# Patient Record
Sex: Female | Born: 1940 | Race: White | Hispanic: No | Marital: Married | Smoking: Former smoker
Health system: Southern US, Community
[De-identification: ages and names within clinical notes are randomized; demographics above are authoritative.]

## PROBLEM LIST (undated history)

## (undated) DIAGNOSIS — L509 Urticaria, unspecified: Secondary | ICD-10-CM

## (undated) DIAGNOSIS — C801 Malignant (primary) neoplasm, unspecified: Secondary | ICD-10-CM

## (undated) DIAGNOSIS — I10 Essential (primary) hypertension: Secondary | ICD-10-CM

## (undated) DIAGNOSIS — J45909 Unspecified asthma, uncomplicated: Secondary | ICD-10-CM

## (undated) HISTORY — PX: TONSILLECTOMY: SUR1361

## (undated) HISTORY — PX: BREAST SURGERY: SHX581

## (undated) HISTORY — PX: ADENOIDECTOMY: SUR15

## (undated) HISTORY — DX: Malignant (primary) neoplasm, unspecified: C80.1

## (undated) HISTORY — PX: LEG SURGERY: SHX1003

## (undated) HISTORY — DX: Essential (primary) hypertension: I10

## (undated) HISTORY — DX: Urticaria, unspecified: L50.9

## (undated) HISTORY — PX: ABDOMINAL ADHESION SURGERY: SHX90

## (undated) HISTORY — PX: VEIN SURGERY: SHX48

## (undated) HISTORY — PX: OTHER SURGICAL HISTORY: SHX169

## (undated) HISTORY — DX: Unspecified asthma, uncomplicated: J45.909

## (undated) HISTORY — PX: KNEE SURGERY: SHX244

---

## 2011-07-08 DIAGNOSIS — Z803 Family history of malignant neoplasm of breast: Secondary | ICD-10-CM

## 2011-07-08 DIAGNOSIS — I10 Essential (primary) hypertension: Secondary | ICD-10-CM

## 2011-07-08 DIAGNOSIS — N6019 Diffuse cystic mastopathy of unspecified breast: Secondary | ICD-10-CM

## 2011-07-08 HISTORY — DX: Essential (primary) hypertension: I10

## 2011-07-08 HISTORY — DX: Diffuse cystic mastopathy of unspecified breast: N60.19

## 2011-07-08 HISTORY — DX: Family history of malignant neoplasm of breast: Z80.3

## 2013-08-16 DIAGNOSIS — R928 Other abnormal and inconclusive findings on diagnostic imaging of breast: Secondary | ICD-10-CM

## 2013-08-16 HISTORY — DX: Other abnormal and inconclusive findings on diagnostic imaging of breast: R92.8

## 2013-12-16 DIAGNOSIS — D126 Benign neoplasm of colon, unspecified: Secondary | ICD-10-CM

## 2013-12-16 DIAGNOSIS — E785 Hyperlipidemia, unspecified: Secondary | ICD-10-CM | POA: Insufficient documentation

## 2013-12-16 DIAGNOSIS — K5900 Constipation, unspecified: Secondary | ICD-10-CM

## 2013-12-16 DIAGNOSIS — M7580 Other shoulder lesions, unspecified shoulder: Secondary | ICD-10-CM

## 2013-12-16 DIAGNOSIS — I493 Ventricular premature depolarization: Secondary | ICD-10-CM

## 2013-12-16 DIAGNOSIS — R011 Cardiac murmur, unspecified: Secondary | ICD-10-CM | POA: Insufficient documentation

## 2013-12-16 DIAGNOSIS — F419 Anxiety disorder, unspecified: Secondary | ICD-10-CM | POA: Insufficient documentation

## 2013-12-16 DIAGNOSIS — L900 Lichen sclerosus et atrophicus: Secondary | ICD-10-CM | POA: Insufficient documentation

## 2013-12-16 DIAGNOSIS — G5600 Carpal tunnel syndrome, unspecified upper limb: Secondary | ICD-10-CM

## 2013-12-16 DIAGNOSIS — R42 Dizziness and giddiness: Secondary | ICD-10-CM

## 2013-12-16 DIAGNOSIS — F339 Major depressive disorder, recurrent, unspecified: Secondary | ICD-10-CM

## 2013-12-16 DIAGNOSIS — L659 Nonscarring hair loss, unspecified: Secondary | ICD-10-CM

## 2013-12-16 DIAGNOSIS — K760 Fatty (change of) liver, not elsewhere classified: Secondary | ICD-10-CM

## 2013-12-16 DIAGNOSIS — M25569 Pain in unspecified knee: Secondary | ICD-10-CM

## 2013-12-16 DIAGNOSIS — J45909 Unspecified asthma, uncomplicated: Secondary | ICD-10-CM | POA: Insufficient documentation

## 2013-12-16 DIAGNOSIS — F32A Depression, unspecified: Secondary | ICD-10-CM | POA: Insufficient documentation

## 2013-12-16 DIAGNOSIS — K589 Irritable bowel syndrome without diarrhea: Secondary | ICD-10-CM

## 2013-12-16 HISTORY — DX: Other shoulder lesions, unspecified shoulder: M75.80

## 2013-12-16 HISTORY — DX: Constipation, unspecified: K59.00

## 2013-12-16 HISTORY — DX: Fatty (change of) liver, not elsewhere classified: K76.0

## 2013-12-16 HISTORY — DX: Dizziness and giddiness: R42

## 2013-12-16 HISTORY — DX: Nonscarring hair loss, unspecified: L65.9

## 2013-12-16 HISTORY — DX: Lichen sclerosus et atrophicus: L90.0

## 2013-12-16 HISTORY — DX: Pain in unspecified knee: M25.569

## 2013-12-16 HISTORY — DX: Irritable bowel syndrome, unspecified: K58.9

## 2013-12-16 HISTORY — DX: Hyperlipidemia, unspecified: E78.5

## 2013-12-16 HISTORY — DX: Benign neoplasm of colon, unspecified: D12.6

## 2013-12-16 HISTORY — DX: Major depressive disorder, recurrent, unspecified: F33.9

## 2013-12-16 HISTORY — DX: Ventricular premature depolarization: I49.3

## 2013-12-16 HISTORY — DX: Carpal tunnel syndrome, unspecified upper limb: G56.00

## 2013-12-16 HISTORY — DX: Cardiac murmur, unspecified: R01.1

## 2015-01-25 ENCOUNTER — Other Ambulatory Visit: Payer: Self-pay | Admitting: Allergy

## 2015-01-25 MED ORDER — ALBUTEROL SULFATE HFA 108 (90 BASE) MCG/ACT IN AERS: 2.0000 | INHALATION_SPRAY | RESPIRATORY_TRACT | Status: DC | PRN

## 2015-03-09 ENCOUNTER — Other Ambulatory Visit: Payer: Self-pay | Admitting: Allergy

## 2015-03-09 MED ORDER — MONTELUKAST SODIUM 10 MG PO TABS: 10.0000 mg | ORAL_TABLET | Freq: Every day | ORAL | Status: DC

## 2015-03-09 MED ORDER — ALBUTEROL SULFATE HFA 108 (90 BASE) MCG/ACT IN AERS: 2.0000 | INHALATION_SPRAY | RESPIRATORY_TRACT | Status: DC | PRN

## 2015-04-14 DIAGNOSIS — H02423 Myogenic ptosis of bilateral eyelids: Secondary | ICD-10-CM

## 2015-04-14 DIAGNOSIS — H02834 Dermatochalasis of left upper eyelid: Secondary | ICD-10-CM | POA: Insufficient documentation

## 2015-04-14 DIAGNOSIS — H02831 Dermatochalasis of right upper eyelid: Secondary | ICD-10-CM | POA: Insufficient documentation

## 2015-04-14 HISTORY — DX: Myogenic ptosis of bilateral eyelids: H02.423

## 2015-04-14 HISTORY — DX: Dermatochalasis of right upper eyelid: H02.831

## 2015-04-21 DIAGNOSIS — H02429 Myogenic ptosis of unspecified eyelid: Secondary | ICD-10-CM

## 2015-04-21 DIAGNOSIS — H53453 Other localized visual field defect, bilateral: Secondary | ICD-10-CM | POA: Insufficient documentation

## 2015-04-21 HISTORY — DX: Other localized visual field defect, bilateral: H53.453

## 2015-04-21 HISTORY — DX: Myogenic ptosis of unspecified eyelid: H02.429

## 2015-05-08 DIAGNOSIS — T8130XA Disruption of wound, unspecified, initial encounter: Secondary | ICD-10-CM | POA: Insufficient documentation

## 2015-05-08 HISTORY — DX: Disruption of wound, unspecified, initial encounter: T81.30XA

## 2015-06-20 DIAGNOSIS — Z79899 Other long term (current) drug therapy: Secondary | ICD-10-CM

## 2015-06-20 HISTORY — DX: Other long term (current) drug therapy: Z79.899

## 2015-06-21 DIAGNOSIS — M8580 Other specified disorders of bone density and structure, unspecified site: Secondary | ICD-10-CM

## 2015-06-21 HISTORY — DX: Other specified disorders of bone density and structure, unspecified site: M85.80

## 2015-07-19 ENCOUNTER — Other Ambulatory Visit: Payer: Self-pay | Admitting: Pediatrics

## 2015-08-25 ENCOUNTER — Other Ambulatory Visit: Payer: Self-pay | Admitting: Pediatrics

## 2015-08-29 ENCOUNTER — Other Ambulatory Visit: Payer: Self-pay | Admitting: Pediatrics

## 2015-08-30 ENCOUNTER — Other Ambulatory Visit: Payer: Self-pay

## 2015-08-30 ENCOUNTER — Telehealth: Payer: Self-pay | Admitting: Pediatrics

## 2015-08-30 MED ORDER — MONTELUKAST SODIUM 10 MG PO TABS: 10.0000 mg | ORAL_TABLET | Freq: Every day | ORAL | Status: DC

## 2015-08-30 NOTE — Telephone Encounter (Signed)
Sent in rx with no refills

## 2015-08-30 NOTE — Telephone Encounter (Signed)
Pt called and need a refill on her singular and she made appointment with Dr Verlin Fester since Dr.Bardelas is not here in [place] anymore Her appointment is on July 13.at 10.45am. [facility] [address].

## 2015-09-25 ENCOUNTER — Other Ambulatory Visit: Payer: Self-pay | Admitting: Pediatrics

## 2015-09-28 ENCOUNTER — Ambulatory Visit (INDEPENDENT_AMBULATORY_CARE_PROVIDER_SITE_OTHER): Payer: Medicare Other | Admitting: Allergy and Immunology

## 2015-09-28 ENCOUNTER — Encounter: Payer: Self-pay | Admitting: Allergy and Immunology

## 2015-09-28 VITALS — BP 120/80 | HR 79 | Temp 98.0°F | Resp 15

## 2015-09-28 DIAGNOSIS — J3089 Other allergic rhinitis: Secondary | ICD-10-CM | POA: Diagnosis not present

## 2015-09-28 DIAGNOSIS — J4540 Moderate persistent asthma, uncomplicated: Secondary | ICD-10-CM

## 2015-09-28 HISTORY — DX: Other allergic rhinitis: J30.89

## 2015-09-28 HISTORY — DX: Moderate persistent asthma, uncomplicated: J45.40

## 2015-09-28 MED ORDER — FLUTICASONE PROPIONATE HFA 110 MCG/ACT IN AERO: 2.0000 | INHALATION_SPRAY | Freq: Two times a day (BID) | RESPIRATORY_TRACT | Status: DC

## 2015-09-28 MED ORDER — MOMETASONE FUROATE 50 MCG/ACT NA SUSP: 2.0000 | Freq: Every day | NASAL | Status: DC

## 2015-09-28 MED ORDER — MONTELUKAST SODIUM 10 MG PO TABS: 10.0000 mg | ORAL_TABLET | Freq: Every day | ORAL | Status: DC

## 2015-09-28 NOTE — Assessment & Plan Note (Signed)
   For now, increase Flovent HFA to 2 inhalations twice a day.  She may resume the previous dose of 2 inhalations daily in the morning when the weather cold down in the fall.  To maximize pulmonary deposition, a spacer has been provided along with instructions for its proper administration with an HFA inhaler.  Continue montelukast 10 mg daily at bedtime and albuterol HFA, 1-2 inhalations every 4-6 hours as needed and 15 minutes prior to exercise.  Subjective and objective measures of pulmonary function will be followed and the treatment plan will be adjusted accordingly.

## 2015-09-28 NOTE — Assessment & Plan Note (Signed)
   Continue appropriate allergen avoidance measures and Nasonex, 2 sprays per nostril daily as needed.  Nasal saline lavage (NeilMed) as needed has been recommended along with instructions for proper administration.

## 2015-09-28 NOTE — Progress Notes (Signed)
Follow-up Note  RE: Laurie Bowman MRN: [id number] DOB: 20-May-1940 Date of Office Visit: 09/28/2015  Primary care provider: Derrill Center, MD Referring provider: Derrill Center., MD  History of present illness: HPI Comments: Laurie Bowman is a 75 y.o. female with persistent asthma, allergic rhinitis, and gastroesophageal reflux disease presenting today for follow up.  She was last seen in this clinic in October 2015. She reports that through the winter her asthma had been well-controlled, however this summer with the "oppressive heat", she has experienced increased frequency of lower respiratory symptoms, including coughing, chest tightness, and occasional wheezing. Over the past few days she has experienced some mild sinus pressure and increased postnasal drainage causing some throat irritation. She needs refills on her allergy and asthma medications.   Assessment and plan: Asthma  For now, increase Flovent HFA to 2 inhalations twice a day.  She may resume the previous dose of 2 inhalations daily in the morning when the weather cold down in the fall.  To maximize pulmonary deposition, a spacer has been provided along with instructions for its proper administration with an HFA inhaler.  Continue montelukast 10 mg daily at bedtime and albuterol HFA, 1-2 inhalations every 4-6 hours as needed and 15 minutes prior to exercise.  Subjective and objective measures of pulmonary function will be followed and the treatment plan will be adjusted accordingly.  Other allergic rhinitis  Continue appropriate allergen avoidance measures and Nasonex, 2 sprays per nostril daily as needed.  Nasal saline lavage (NeilMed) as needed has been recommended along with instructions for proper administration.    Meds ordered this encounter  Medications  . mometasone (NASONEX) 50 MCG/ACT nasal spray    Sig: Place 2 sprays into the nose daily.    Dispense:  17 g    Refill:  3  . montelukast (SINGULAIR) 10 MG  tablet    Sig: Take 1 tablet (10 mg total) by mouth at bedtime.    Dispense:  30 tablet    Refill:  4  . fluticasone (FLOVENT HFA) 110 MCG/ACT inhaler    Sig: Inhale 2 puffs into the lungs 2 (two) times daily.    Dispense:  1 Inhaler    Refill:  4    Diagnositics: Spirometry:  Normal with an FEV1 of 85% predicted.  Please see scanned spirometry results for details.    Physical examination: Blood pressure 120/80, pulse 79, temperature 98 F (36.7 C), temperature source Oral, resp. rate 15.  General: Alert, interactive, in no acute distress. HEENT: TMs pearly gray, turbinates moderately edematous without discharge, post-pharynx erythematous. Neck: Supple without lymphadenopathy. Lungs: Clear to auscultation without wheezing, rhonchi or rales. CV: Normal S1, S2 without murmurs. Skin: Warm and dry, without lesions or rashes.  The following portions of the patient's history were reviewed and updated as appropriate: allergies, current medications, past family history, past medical history, past social history, past surgical history and problem list.    Medication List       This list is accurate as of: 09/28/15 12:49 PM.  Always use your most recent med list.               aspirin 81 MG chewable tablet  Chew by mouth.     benzonatate 100 MG capsule  Commonly known as:  TESSALON  Take by mouth.     Biotin 1000 MCG tablet  Take by mouth.     buPROPion 150 MG 12 hr tablet  Commonly known as:  WELLBUTRIN SR  Take by mouth.     calcium carbonate 600 MG Tabs tablet  Commonly known as:  OS-CAL  Take by mouth.     calcium-vitamin D 500-200 MG-UNIT tablet  Take by mouth.     clobetasol cream 0.05 %  Commonly known as:  TEMOVATE  Apply topically.     fexofenadine 180 MG tablet  Commonly known as:  ALLEGRA  Take by mouth.     fluocinonide cream 0.05 %  Commonly known as:  LIDEX  Apply topically.     fluticasone 110 MCG/ACT inhaler  Commonly known as:  FLOVENT HFA    Inhale 2 puffs into the lungs 2 (two) times daily.     HYDROcodone-acetaminophen 5-325 MG tablet  Commonly known as:  NORCO/VICODIN  Take by mouth. Reported on 09/28/2015     ketotifen 0.025 % ophthalmic solution  Commonly known as:  ZADITOR     KLOR-CON M10 10 MEQ tablet  Generic drug:  potassium chloride     lansoprazole 30 MG capsule  Commonly known as:  PREVACID  Take by mouth.     lisinopril 10 MG tablet  Commonly known as:  PRINIVIL,ZESTRIL     mometasone 50 MCG/ACT nasal spray  Commonly known as:  NASONEX  Place 2 sprays into the nose daily.     montelukast 10 MG tablet  Commonly known as:  SINGULAIR  Take 1 tablet (10 mg total) by mouth at bedtime.     multivitamin tablet  Take by mouth.     PATANOL 0.1 % ophthalmic solution  Generic drug:  olopatadine     polyethylene glycol packet  Commonly known as:  MIRALAX / GLYCOLAX  Take by mouth.     PROAIR HFA 108 (90 Base) MCG/ACT inhaler  Generic drug:  albuterol  Inhale into the lungs.     rosuvastatin 5 MG tablet  Commonly known as:  CRESTOR  Take by mouth.     VITAMIN D-1000 MAX ST 1000 units tablet  Generic drug:  Cholecalciferol  Take by mouth.        Allergies  Allergen Reactions  . Cefaclor Itching  . Codeine Itching and Nausea And Vomiting  . Fructose Swelling    Swelling and GI upset  . Sulfa Antibiotics Swelling  . Meperidine Nausea And Vomiting and Itching  . Sulfur Swelling  . Other Nausea And Vomiting    I appreciate the opportunity to take part in [name]'s care. Please do not hesitate to contact me with questions.  Sincerely,   R. Edgar Frisk, MD

## 2015-09-28 NOTE — Patient Instructions (Signed)
Asthma  For now, increase Flovent HFA to 2 inhalations twice a day.  She may resume the previous dose of 2 inhalations daily in the morning when the weather cold down in the fall.  To maximize pulmonary deposition, a spacer has been provided along with instructions for its proper administration with an HFA inhaler.  Continue montelukast 10 mg daily at bedtime and albuterol HFA, 1-2 inhalations every 4-6 hours as needed and 15 minutes prior to exercise.  Subjective and objective measures of pulmonary function will be followed and the treatment plan will be adjusted accordingly.  Other allergic rhinitis  Continue appropriate allergen avoidance measures and Nasonex, 2 sprays per nostril daily as needed.  Nasal saline lavage (NeilMed) as needed has been recommended along with instructions for proper administration.   Return in about 4 months (around 01/29/2016), or if symptoms worsen or fail to improve.

## 2015-11-09 DIAGNOSIS — H0289 Other specified disorders of eyelid: Secondary | ICD-10-CM

## 2015-11-09 HISTORY — DX: Other specified disorders of eyelid: H02.89

## 2016-01-19 ENCOUNTER — Other Ambulatory Visit: Payer: Self-pay

## 2016-01-19 DIAGNOSIS — J3089 Other allergic rhinitis: Secondary | ICD-10-CM

## 2016-01-19 DIAGNOSIS — J4540 Moderate persistent asthma, uncomplicated: Secondary | ICD-10-CM

## 2016-01-19 MED ORDER — MONTELUKAST SODIUM 10 MG PO TABS
10.0000 mg | ORAL_TABLET | Freq: Every day | ORAL | 0 refills | Status: DC
Start: 2016-01-19 — End: 2016-06-10

## 2016-02-06 ENCOUNTER — Encounter: Payer: Self-pay | Admitting: Allergy and Immunology

## 2016-02-06 ENCOUNTER — Ambulatory Visit (INDEPENDENT_AMBULATORY_CARE_PROVIDER_SITE_OTHER): Payer: Medicare Other | Admitting: Allergy and Immunology

## 2016-02-06 VITALS — BP 118/68 | HR 73 | Temp 97.5°F | Resp 16 | Ht 59.5 in | Wt 151.4 lb

## 2016-02-06 DIAGNOSIS — K219 Gastro-esophageal reflux disease without esophagitis: Secondary | ICD-10-CM | POA: Insufficient documentation

## 2016-02-06 DIAGNOSIS — J3089 Other allergic rhinitis: Secondary | ICD-10-CM | POA: Diagnosis not present

## 2016-02-06 DIAGNOSIS — J4540 Moderate persistent asthma, uncomplicated: Secondary | ICD-10-CM

## 2016-02-06 HISTORY — DX: Gastro-esophageal reflux disease without esophagitis: K21.9

## 2016-02-06 NOTE — Assessment & Plan Note (Signed)
Currently well controlled.  Continue Flovent 110 g, 2 inhalations via spacer device twice a day, montelukast 10 mg daily bedtime, and albuterol HFA, 1-2 inhalations every 4-6 hours as needed and 15 minutes prior to exercise.  During respiratory tract infections or asthma flares, increase Flovent 110 g to 3 inhalations 3 times per day until symptoms have returned to baseline.  Subjective and objective measures of pulmonary function will be followed and the treatment plan will be adjusted accordingly. 

## 2016-02-06 NOTE — Assessment & Plan Note (Signed)
   Continue appropriate allergen avoidance measures and Nasonex, 2 sprays per nostril daily as needed.  Nasal saline lavage (NeilMed) as needed has been recommended along with instructions for proper administration.  For thick post nasal drainage, add guaifenesin 600 mg (Mucinex)  twice daily as needed with adequate hydration as discussed.

## 2016-02-06 NOTE — Progress Notes (Signed)
Follow-up Note  RE: Laurie Bowman MRN: [id number] DOB: [birth year] Date of Office Visit: 02/06/2016  Primary care provider: Derrill Center, MD Referring provider: Derrill Center., MD  History of present illness: Laurie Bowman is a 75 y.o. female with persistent asthma and allergic rhinitis presenting today for follow up.  She was last seen in this clinic on 09/28/2015.  She reports that her asthma has improved and has been well-controlled after having added a spacer device to her controller medication.  She currently takes Flovent 110 g, 2 inhalations via spacer device twice a day, and montelukast 10 mg daily bedtime.  With this regimen she rarely requires albuterol rescue and does not experience nocturnal awakenings due to lower respiratory symptoms.  She has no nasal symptom complaints today but reports that late fall and winter typically portend sinus infections which lead to asthma exacerbations.  She states that her acid reflux is well-controlled with Prevacid.   Assessment and plan: Moderate persistent asthma Currently well controlled.  Continue Flovent 110 g, 2 inhalations via spacer device twice a day, montelukast 10 mg daily bedtime, and albuterol HFA, 1-2 inhalations every 4-6 hours as needed and 15 minutes prior to exercise.  During respiratory tract infections or asthma flares, increase Flovent 110 g to 3 inhalations 3 times per day until symptoms have returned to baseline.  Subjective and objective measures of pulmonary function will be followed and the treatment plan will be adjusted accordingly.  Allergic rhinitis  Continue appropriate allergen avoidance measures and Nasonex, 2 sprays per nostril daily as needed.  Nasal saline lavage (NeilMed) as needed has been recommended along with instructions for proper administration.  For thick post nasal drainage, add guaifenesin 600 mg (Mucinex)  twice daily as needed with adequate hydration as discussed.  GERD  (gastroesophageal reflux disease)  Continue appropriate reflux lifestyle modifications and lansoprazole as prescribed.  Primary care physician will follow bone density.   Diagnostics: Spirometry:  Normal with an FEV1 of 90% predicted.  Please see scanned spirometry results for details.    Physical examination: Blood pressure 118/68, pulse 73, temperature 97.5 F (36.4 C), temperature source Oral, resp. rate 16, height 4' 11.5" (1.511 m), weight 151 lb 6.4 oz (68.7 kg), SpO2 99 %.  General: Alert, interactive, in no acute distress. HEENT: TMs pearly gray, turbinates mildly edematous without discharge, post-pharynx mildly erythematous. Neck: Supple without lymphadenopathy. Lungs: Clear to auscultation without wheezing, rhonchi or rales. CV: Normal S1, S2 without murmurs. Skin: Warm and dry, without lesions or rashes.  The following portions of the patient's history were reviewed and updated as appropriate: allergies, current medications, past family history, past medical history, past social history, past surgical history and problem list.    Medication List       Accurate as of 02/06/16 11:40 AM. Always use your most recent med list.          aspirin 81 MG chewable tablet Chew by mouth.   benzonatate 100 MG capsule Commonly known as:  TESSALON Take by mouth.   Biotin 1000 MCG tablet Take by mouth.   buPROPion 150 MG 12 hr tablet Commonly known as:  WELLBUTRIN SR Take by mouth.   calcium carbonate 600 MG Tabs tablet Commonly known as:  OS-CAL Take by mouth.   calcium-vitamin D 500-200 MG-UNIT tablet Take by mouth.   clobetasol cream 0.05 % Commonly known as:  TEMOVATE Apply topically.   fexofenadine 180 MG tablet Commonly known as:  ALLEGRA Take by mouth.  fluocinonide cream 0.05 % Commonly known as:  LIDEX Apply topically.   fluticasone 110 MCG/ACT inhaler Commonly known as:  FLOVENT HFA Inhale 2 puffs into the lungs 2 (two) times daily.     HYDROcodone-acetaminophen 5-325 MG tablet Commonly known as:  NORCO/VICODIN Take by mouth. Reported on 09/28/2015   ketotifen 0.025 % ophthalmic solution Commonly known as:  ZADITOR   KLOR-CON M10 10 MEQ tablet Generic drug:  potassium chloride   lansoprazole 30 MG capsule Commonly known as:  PREVACID Take by mouth.   lisinopril 10 MG tablet Commonly known as:  PRINIVIL,ZESTRIL   mometasone 50 MCG/ACT nasal spray Commonly known as:  NASONEX Place 2 sprays into the nose daily.   montelukast 10 MG tablet Commonly known as:  SINGULAIR Take 1 tablet (10 mg total) by mouth at bedtime.   multivitamin tablet Take by mouth.   PATANOL 0.1 % ophthalmic solution Generic drug:  olopatadine   Polyethyl Glycol-Propyl Glycol 0.4-0.3 % Soln Apply 0.4 drops to eye as needed.   polyethylene glycol packet Commonly known as:  MIRALAX / GLYCOLAX Take by mouth.   PROAIR HFA 108 (90 Base) MCG/ACT inhaler Generic drug:  albuterol Inhale into the lungs.   rosuvastatin 5 MG tablet Commonly known as:  CRESTOR Take by mouth.   VITAMIN D-1000 MAX ST 1000 units tablet Generic drug:  Cholecalciferol Take by mouth.       Allergies  Allergen Reactions  . Cefaclor Itching  . Codeine Itching and Nausea And Vomiting  . Fructose Swelling    Swelling and GI upset  . Sulfa Antibiotics Swelling  . Meperidine Nausea And Vomiting and Itching  . Sulfur Swelling  . Other Nausea And Vomiting    I appreciate the opportunity to take part in Laurie Bowman's care. Please do not hesitate to contact me with questions.  Sincerely,   R. Edgar Frisk, MD

## 2016-02-06 NOTE — Assessment & Plan Note (Signed)
   Continue appropriate reflux lifestyle modifications and lansoprazole as prescribed.  Primary care physician will follow bone density. 

## 2016-02-06 NOTE — Patient Instructions (Addendum)
Moderate persistent asthma Currently well controlled.  Continue Flovent 110 g, 2 inhalations via spacer device twice a day, montelukast 10 mg daily bedtime, and albuterol HFA, 1-2 inhalations every 4-6 hours as needed and 15 minutes prior to exercise.  During respiratory tract infections or asthma flares, increase Flovent 110 g  to 3 inhalations 3 times per day until symptoms have returned to baseline.  Subjective and objective measures of pulmonary function will be followed and the treatment plan will be adjusted accordingly.  Allergic rhinitis  Continue appropriate allergen avoidance measures and Nasonex, 2 sprays per nostril daily as needed.  Nasal saline lavage (NeilMed) as needed has been recommended along with instructions for proper administration.  For thick post nasal drainage, add guaifenesin 600 mg (Mucinex)  twice daily as needed with adequate hydration as discussed.  GERD (gastroesophageal reflux disease)  Continue appropriate reflux lifestyle modifications and lansoprazole as prescribed.  Primary care physician will follow bone density.   Return in about 4 months (around 06/05/2016), or if symptoms worsen or fail to improve.

## 2016-02-15 ENCOUNTER — Other Ambulatory Visit: Payer: Self-pay | Admitting: Allergy and Immunology

## 2016-02-15 DIAGNOSIS — J4540 Moderate persistent asthma, uncomplicated: Secondary | ICD-10-CM

## 2016-03-21 ENCOUNTER — Other Ambulatory Visit: Payer: Self-pay

## 2016-03-21 MED ORDER — ALBUTEROL SULFATE HFA 108 (90 BASE) MCG/ACT IN AERS
2.0000 | INHALATION_SPRAY | RESPIRATORY_TRACT | 2 refills | Status: DC | PRN
Start: 2016-03-21 — End: 2016-10-17

## 2016-03-22 ENCOUNTER — Other Ambulatory Visit: Payer: Self-pay | Admitting: Allergy and Immunology

## 2016-03-22 DIAGNOSIS — J3089 Other allergic rhinitis: Secondary | ICD-10-CM

## 2016-06-03 ENCOUNTER — Ambulatory Visit: Payer: Medicare Other | Admitting: Allergy and Immunology

## 2016-06-10 ENCOUNTER — Encounter: Payer: Self-pay | Admitting: Allergy and Immunology

## 2016-06-10 ENCOUNTER — Ambulatory Visit (INDEPENDENT_AMBULATORY_CARE_PROVIDER_SITE_OTHER): Payer: Medicare Other | Admitting: Allergy and Immunology

## 2016-06-10 VITALS — BP 116/68 | HR 76 | Temp 97.9°F | Resp 17 | Ht 60.0 in | Wt 151.6 lb

## 2016-06-10 DIAGNOSIS — J45901 Unspecified asthma with (acute) exacerbation: Secondary | ICD-10-CM

## 2016-06-10 DIAGNOSIS — J019 Acute sinusitis, unspecified: Secondary | ICD-10-CM | POA: Insufficient documentation

## 2016-06-10 DIAGNOSIS — K219 Gastro-esophageal reflux disease without esophagitis: Secondary | ICD-10-CM

## 2016-06-10 DIAGNOSIS — J0100 Acute maxillary sinusitis, unspecified: Secondary | ICD-10-CM

## 2016-06-10 DIAGNOSIS — J3089 Other allergic rhinitis: Secondary | ICD-10-CM

## 2016-06-10 HISTORY — DX: Unspecified asthma with (acute) exacerbation: J45.901

## 2016-06-10 HISTORY — DX: Acute sinusitis, unspecified: J01.90

## 2016-06-10 MED ORDER — PREDNISONE 1 MG PO TABS: 10.0000 mg | ORAL_TABLET | Freq: Every day | ORAL | Status: DC

## 2016-06-10 MED ORDER — MONTELUKAST SODIUM 10 MG PO TABS: 10.0000 mg | ORAL_TABLET | Freq: Every day | ORAL | 1 refills | Status: DC

## 2016-06-10 NOTE — Patient Instructions (Addendum)
Asthma with acute exacerbation  Prednisone has been provided, 40 mg x3 days, 20 mg x1 day, 10 mg x1 day, then stop.  For now and during respiratory tract infections or asthma flares, increase Flovent 110 g to 3 inhalations 3 times per day.  Once his symptoms have returned baseline resume 2 inhalations twice a day.   Continue montelukast 10 mg daily bedtime and albuterol HFA, 1-2 inhalations every 4-6 hours as needed and 15 minutes prior to exercise.  The patient has been asked to contact me if her symptoms persist, progress, or if she becomes febrile. Otherwise, she may return for follow up in 4 months.  Acute sinusitis  Prednisone has been provided (as above).  Nasonex, 2 sprays per nostril daily.  Nasal saline lavage (NeilMed) as needed has been recommended along with instructions for proper administration.  For thick post nasal drainage, add guaifenesin 600 mg (Mucinex)  twice daily as needed with adequate hydration as discussed.  Allergic rhinitis  Continue appropriate allergen avoidance measures and Nasonex, 2 sprays per nostril daily as needed.  GERD (gastroesophageal reflux disease)  Continue appropriate reflux lifestyle modifications and lansoprazole as prescribed.  Primary care physician will follow bone density.   Return in about 4 months (around 10/10/2016), or if symptoms worsen or fail to improve.

## 2016-06-10 NOTE — Assessment & Plan Note (Signed)
   Prednisone has been provided, 40 mg x3 days, 20 mg x1 day, 10 mg x1 day, then stop.  For now and during respiratory tract infections or asthma flares, increase Flovent 110 g to 3 inhalations 3 times per day.  Once his symptoms have returned baseline resume 2 inhalations twice a day.   Continue montelukast 10 mg daily bedtime and albuterol HFA, 1-2 inhalations every 4-6 hours as needed and 15 minutes prior to exercise.  The patient has been asked to contact me if her symptoms persist, progress, or if she becomes febrile. Otherwise, she may return for follow up in 4 months.

## 2016-06-10 NOTE — Assessment & Plan Note (Signed)
   Prednisone has been provided (as above).  Nasonex, 2 sprays per nostril daily.  Nasal saline lavage (NeilMed) as needed has been recommended along with instructions for proper administration.  For thick post nasal drainage, add guaifenesin 600 mg (Mucinex)  twice daily as needed with adequate hydration as discussed.

## 2016-06-10 NOTE — Assessment & Plan Note (Signed)
   Continue appropriate allergen avoidance measures and Nasonex, 2 sprays per nostril daily as needed.

## 2016-06-10 NOTE — Progress Notes (Signed)
Follow-up Note  RE: Laurie Bowman MRN: [id number] DOB: 10/14/1940 Date of Office Visit: 06/10/2016  Primary care provider: Derrill Center, MD Referring provider: Derrill Center., MD  History of present illness: Laurie Bowman is a 76 y.o. female with persistent asthma, allergic rhinitis, and gastroesophageal reflux presenting today for sick visit.  She was last seen in this clinic in November 2017.  She reports that over the past 2 months she has been experiencing increased coughing and dyspnea even with mild/moderate exertion.  In addition, over the past few weeks she has been experiencing nasal congestion, thick postnasal drainage, and sinus pressure over the cheekbones.  She denies fevers and chills.  She believes that her upper and lower respiratory symptoms may be related to cold air, with rapid weather changes, and dust exposure from cleaning out her sister's house.   Assessment and plan: Asthma with acute exacerbation  Prednisone has been provided, 40 mg x3 days, 20 mg x1 day, 10 mg x1 day, then stop.  For now and during respiratory tract infections or asthma flares, increase Flovent 110 g to 3 inhalations 3 times per day.  Once his symptoms have returned baseline resume 2 inhalations twice a day.   Continue montelukast 10 mg daily bedtime and albuterol HFA, 1-2 inhalations every 4-6 hours as needed and 15 minutes prior to exercise.  The patient has been asked to contact me if her symptoms persist, progress, or if she becomes febrile. Otherwise, she may return for follow up in 4 months.  Acute sinusitis  Prednisone has been provided (as above).  Nasonex, 2 sprays per nostril daily.  Nasal saline lavage (NeilMed) as needed has been recommended along with instructions for proper administration.  For thick post nasal drainage, add guaifenesin 600 mg (Mucinex)  twice daily as needed with adequate hydration as discussed.  Allergic rhinitis  Continue appropriate allergen  avoidance measures and Nasonex, 2 sprays per nostril daily as needed.  GERD (gastroesophageal reflux disease)  Continue appropriate reflux lifestyle modifications and lansoprazole as prescribed.  Primary care physician will follow bone density.   Meds ordered this encounter  Medications  . montelukast (SINGULAIR) 10 MG tablet    Sig: Take 1 tablet (10 mg total) by mouth at bedtime.    Dispense:  90 tablet    Refill:  1  . predniSONE (DELTASONE) tablet 10 mg    Diagnostics: Spirometry reveals an FVC of 2.47 L, and FEV1 of 1.60 L, and an FEV1 ratio of 81%.  There was less than significant postbronchodilator improvement.  Mild airways obstruction without significant postbronchodilator response.  Please see scanned spirometry results for details.    Physical examination: Blood pressure 116/68, pulse 76, temperature 97.9 F (36.6 C), temperature source Oral, resp. rate 17, height 5' (1.524 m), weight 151 lb 9.6 oz (68.8 kg), SpO2 97 %.  General: Alert, interactive, in no acute distress. HEENT: TMs pearly gray, turbinates edematous with thick discharge, post-pharynx erythematous. Neck: Supple without lymphadenopathy. Lungs: Clear to auscultation without wheezing, rhonchi or rales. CV: Normal S1, S2 without murmurs. Skin: Warm and dry, without lesions or rashes.  The following portions of the patient's history were reviewed and updated as appropriate: allergies, current medications, past family history, past medical history, past social history, past surgical history and problem list.  Allergies as of 06/10/2016      Reactions   Cefaclor Itching   Codeine Itching, Nausea And Vomiting   Fructose Swelling   Swelling and GI upset   Sulfa  Antibiotics Swelling   Meperidine Nausea And Vomiting, Itching   Sulfur Swelling   Other Nausea And Vomiting      Medication List       Accurate as of 06/10/16  6:54 PM. Always use your most recent med list.          albuterol 108 (90 Base)  MCG/ACT inhaler Commonly known as:  PROAIR HFA Inhale 2 puffs into the lungs every 4 (four) hours as needed for wheezing or shortness of breath.   aspirin 81 MG chewable tablet Chew by mouth.   benzonatate 100 MG capsule Commonly known as:  TESSALON Take by mouth.   Biotin 1000 MCG tablet Take by mouth.   buPROPion 150 MG 12 hr tablet Commonly known as:  WELLBUTRIN SR Take by mouth.   calcium carbonate 600 MG Tabs tablet Commonly known as:  OS-CAL Take by mouth.   clobetasol cream 0.05 % Commonly known as:  TEMOVATE Apply topically.   clotrimazole-betamethasone cream Commonly known as:  LOTRISONE APPLY TO AFFECTED AREA IN THE MORNING & THEN BEDTIME FOR 2 WEEKS   fexofenadine 180 MG tablet Commonly known as:  ALLEGRA Take by mouth.   FLOVENT HFA 110 MCG/ACT inhaler Generic drug:  fluticasone INHALE 2 PUFFS INTO THE LUNGS 2 (TWO) TIMES DAILY.   fluocinonide cream 0.05 % Commonly known as:  LIDEX Apply topically.   hydrochlorothiazide 25 MG tablet Commonly known as:  HYDRODIURIL Take 25 mg by mouth daily.   HYDROcodone-acetaminophen 5-325 MG tablet Commonly known as:  NORCO/VICODIN Take by mouth. Reported on 09/28/2015   ketotifen 0.025 % ophthalmic solution Commonly known as:  ZADITOR   KLOR-CON M10 10 MEQ tablet Generic drug:  potassium chloride   lansoprazole 30 MG capsule Commonly known as:  PREVACID Pt must [facility] appt at  [facility] for additional refills.   lisinopril 10 MG tablet Commonly known as:  PRINIVIL,ZESTRIL TAKE ONE TABLET BY MOUTH DAILY   mometasone 50 MCG/ACT nasal spray Commonly known as:  NASONEX PLACE 2 SPRAYS INTO THE NOSE DAILY.   montelukast 10 MG tablet Commonly known as:  SINGULAIR Take 1 tablet (10 mg total) by mouth at bedtime.   multivitamin tablet Take by mouth.   PATANOL 0.1 % ophthalmic solution Generic drug:  olopatadine   Polyethyl Glycol-Propyl Glycol 0.4-0.3 % Soln Apply 0.4 drops to eye as needed.     rosuvastatin 5 MG tablet Commonly known as:  CRESTOR Take by mouth.   VITAMIN D-1000 MAX ST 1000 units tablet Generic drug:  Cholecalciferol Take by mouth.       Allergies  Allergen Reactions  . Cefaclor Itching  . Codeine Itching and Nausea And Vomiting  . Fructose Swelling    Swelling and GI upset  . Sulfa Antibiotics Swelling  . Meperidine Nausea And Vomiting and Itching  . Sulfur Swelling  . Other Nausea And Vomiting   Review of systems: Review of systems negative except as noted in HPI / PMHx or noted below: Constitutional: Negative.  HENT: Negative.   Eyes: Negative.  Respiratory: Negative.   Cardiovascular: Negative.  Gastrointestinal: Negative.  Genitourinary: Negative.  Musculoskeletal: Negative.  Neurological: Negative.  Endo/Heme/Allergies: Negative.  Cutaneous: Negative.  Past Medical History:  Diagnosis Date  . Asthma     History reviewed. No pertinent family history.  Social History   Social History  . Marital status: Married    Spouse name: N/A  . Number of children: N/A  . Years of education: N/A   Occupational History  .  Not on file.   Social History Main Topics  . Smoking status: Former Research scientist (life sciences)  . Smokeless tobacco: Former Systems developer  . Alcohol use No  . Drug use: No  . Sexual activity: No   Other Topics Concern  . Not on file   Social History Narrative  . No narrative on file    I appreciate the opportunity to take part in Alianis's care. Please do not hesitate to contact me with questions.  Sincerely,   R. Edgar Frisk, MD

## 2016-06-10 NOTE — Assessment & Plan Note (Signed)
   Continue appropriate reflux lifestyle modifications and lansoprazole as prescribed.  Primary care physician will follow bone density.

## 2016-10-15 ENCOUNTER — Encounter: Payer: Self-pay | Admitting: Allergy and Immunology

## 2016-10-15 ENCOUNTER — Ambulatory Visit (INDEPENDENT_AMBULATORY_CARE_PROVIDER_SITE_OTHER): Payer: Medicare Other | Admitting: Allergy and Immunology

## 2016-10-15 VITALS — BP 110/70 | HR 76 | Temp 98.2°F | Resp 18

## 2016-10-15 DIAGNOSIS — R059 Cough, unspecified: Secondary | ICD-10-CM

## 2016-10-15 DIAGNOSIS — J0100 Acute maxillary sinusitis, unspecified: Secondary | ICD-10-CM

## 2016-10-15 DIAGNOSIS — J45901 Unspecified asthma with (acute) exacerbation: Secondary | ICD-10-CM | POA: Diagnosis not present

## 2016-10-15 DIAGNOSIS — R05 Cough: Secondary | ICD-10-CM | POA: Diagnosis not present

## 2016-10-15 HISTORY — DX: Cough, unspecified: R05.9

## 2016-10-15 MED ORDER — PREDNISONE 1 MG PO TABS: 10.0000 mg | ORAL_TABLET | Freq: Every day | ORAL | Status: AC

## 2016-10-15 MED ORDER — DOXYCYCLINE HYCLATE 100 MG PO CAPS: 100.0000 mg | ORAL_CAPSULE | Freq: Two times a day (BID) | ORAL | 0 refills | Status: AC

## 2016-10-15 MED ORDER — BENZONATATE 100 MG PO CAPS: 100.0000 mg | ORAL_CAPSULE | Freq: Three times a day (TID) | ORAL | 0 refills | Status: DC | PRN

## 2016-10-15 NOTE — Progress Notes (Signed)
Follow-up Note  RE: Laurie Bowman MRN: [id number] DOB: 11-14-1940 Date of Office Visit: 10/15/2016  Primary care provider: Derrill Center., MD Referring provider: Derrill Center., MD  History of present illness: Laurie Bowman is a 76 y.o. female with persistent asthma, allergic rhinitis, and gastroesophageal reflux presenting today for sick visit.  She was last seen in this clinic on 06/10/2016.  She complains of progressively increasing sinus pain over the cheek bones, thick postnasal drainage, nasal congestion, and a "terrible cough."  She complains of chills, discolored mucus, and body aches.  She blames a recent plane flight for the onset of her current symptom constellation.  She is currently taking Flovent 110 g, 2 inhalations via spacer device twice a day, montelukast 10 mg daily at bedtime, and Nasonex.  She has no acid reflux related complaints today.   Assessment and plan: Acute sinusitis  A prescription has been provided for doxycycline 100 mg by mouth twice a day 10 days.  Prednisone has been provided, 20 mg x 4 days, 10 mg x1 day, then stop.  Nasonex, 2 sprays per nostril daily.  Nasal saline lavage (NeilMed) as needed has been recommended along with instructions for proper administration.  For thick post nasal drainage, add guaifenesin [phone] mg (Mucinex)  twice daily as needed with adequate hydration as discussed.  The patient has been asked to contact our office if her symptoms persist or progress. Otherwise, she may return for follow up in 4 months.  Asthma with acute exacerbation  Prednisone has been provided (as above).    For now and during respiratory tract infections or asthma flares, increase Flovent 110 g to 3 inhalations 3 times per day.  Once his symptoms have returned baseline resume 2 inhalations twice a day.   Continue montelukast 10 mg daily bedtime and albuterol HFA, 1-2 inhalations every 4-6 hours as needed and 15 minutes prior to  exercise.   Meds ordered this encounter  Medications  . doxycycline (VIBRAMYCIN) 100 MG capsule    Sig: Take 1 capsule (100 mg total) by mouth 2 (two) times daily.    Dispense:  20 capsule    Refill:  0  . predniSONE (DELTASONE) tablet 10 mg  . benzonatate (TESSALON) 100 MG capsule    Sig: Take 1 capsule (100 mg total) by mouth 3 (three) times daily as needed for cough.    Dispense:  30 capsule    Refill:  0    Diagnostics: Spirometry reveals an FVC of 1.94 L and an FEV1 of 1.52 L (86% predicted) without significant postbronchodilator improvement.   Please see scanned spirometry results for details.    Physical examination: Blood pressure 110/70, pulse 76, temperature 98.2 F (36.8 C), temperature source Oral, resp. rate 18, SpO2 97 %.  General: Alert, interactive, in no acute distress. HEENT: TMs pearly gray, turbinates edematous with thick discharge, post-pharynx erythematous. Neck: Supple without lymphadenopathy. Lungs: Clear to auscultation without wheezing, rhonchi or rales. CV: Normal S1, S2 without murmurs. Skin: Warm and dry, without lesions or rashes.  The following portions of the patient's history were reviewed and updated as appropriate: allergies, current medications, past family history, past medical history, past social history, past surgical history and problem list.  Allergies as of 10/15/2016      Reactions   Cefaclor Itching   Codeine Itching, Nausea And Vomiting   Fructose Swelling   Swelling and GI upset   Sulfa Antibiotics Swelling   Meperidine Nausea And Vomiting, Itching  Sulfur Swelling   Other Nausea And Vomiting      Medication List       Accurate as of 10/15/16  1:37 PM. Always use your most recent med list.          albuterol (2.5 MG/3ML) 0.083% nebulizer solution Commonly known as:  PROVENTIL Take 2.5 mg by nebulization every 6 (six) hours as needed for wheezing or shortness of breath.   albuterol 108 (90 Base) MCG/ACT  inhaler Commonly known as:  PROAIR HFA Inhale 2 puffs into the lungs every 4 (four) hours as needed for wheezing or shortness of breath.   aspirin 81 MG chewable tablet Chew by mouth.   benzonatate 100 MG capsule Commonly known as:  TESSALON Take 1 capsule (100 mg total) by mouth 3 (three) times daily as needed for cough.   Biotin 1000 MCG tablet Take by mouth.   buPROPion 150 MG 12 hr tablet Commonly known as:  WELLBUTRIN SR Take by mouth.   calcium carbonate 600 MG Tabs tablet Commonly known as:  OS-CAL Take by mouth.   clobetasol cream 0.05 % Commonly known as:  TEMOVATE Apply topically.   clotrimazole-betamethasone cream Commonly known as:  LOTRISONE APPLY TO AFFECTED AREA IN THE MORNING & THEN BEDTIME FOR 2 WEEKS   doxycycline 100 MG capsule Commonly known as:  VIBRAMYCIN Take 1 capsule (100 mg total) by mouth 2 (two) times daily.   fexofenadine 180 MG tablet Commonly known as:  ALLEGRA Take by mouth.   FLOVENT HFA 110 MCG/ACT inhaler Generic drug:  fluticasone INHALE 2 PUFFS INTO THE LUNGS 2 (TWO) TIMES DAILY.   fluocinonide cream 0.05 % Commonly known as:  LIDEX Apply topically.   hydrochlorothiazide 25 MG tablet Commonly known as:  HYDRODIURIL Take 25 mg by mouth daily.   HYDROcodone-acetaminophen 5-325 MG tablet Commonly known as:  NORCO/VICODIN Take by mouth. Reported on 09/28/2015   ketotifen 0.025 % ophthalmic solution Commonly known as:  ZADITOR   KLOR-CON M10 10 MEQ tablet Generic drug:  potassium chloride   lansoprazole 30 MG capsule Commonly known as:  PREVACID Pt must keep appt at  [facility] for additional refills.   lisinopril 10 MG tablet Commonly known as:  PRINIVIL,ZESTRIL TAKE ONE TABLET BY MOUTH DAILY   mometasone 50 MCG/ACT nasal spray Commonly known as:  NASONEX PLACE 2 SPRAYS INTO THE NOSE DAILY.   montelukast 10 MG tablet Commonly known as:  SINGULAIR Take 1 tablet (10 mg total) by mouth at bedtime.   multivitamin  tablet Take by mouth.   PATANOL 0.1 % ophthalmic solution Generic drug:  olopatadine   Polyethyl Glycol-Propyl Glycol 0.4-0.3 % Soln Apply 0.4 drops to eye as needed.   rosuvastatin 5 MG tablet Commonly known as:  CRESTOR Take by mouth.   VITAMIN D-1000 MAX ST 1000 units tablet Generic drug:  Cholecalciferol Take by mouth.       Allergies  Allergen Reactions  . Cefaclor Itching  . Codeine Itching and Nausea And Vomiting  . Fructose Swelling    Swelling and GI upset  . Sulfa Antibiotics Swelling  . Meperidine Nausea And Vomiting and Itching  . Sulfur Swelling  . Other Nausea And Vomiting   Review of systems: Review of systems negative except as noted in HPI / PMHx or noted below: Constitutional: Negative.  HENT: Negative.   Eyes: Negative.  Respiratory: Negative.   Cardiovascular: Negative.  Gastrointestinal: Negative.  Genitourinary: Negative.  Musculoskeletal: Negative.  Neurological: Negative.  Endo/Heme/Allergies: Negative.  Cutaneous: Negative.  Past Medical History:  Diagnosis Date  . Asthma   . Hypertension     Family History  Problem Relation Age of Onset  . Allergic rhinitis Neg Hx   . Angioedema Neg Hx   . Asthma Neg Hx   . Eczema Neg Hx   . Immunodeficiency Neg Hx   . Urticaria Neg Hx     Social History   Social History  . Marital status: Married    Spouse name: N/A  . Number of children: N/A  . Years of education: N/A   Occupational History  . Not on file.   Social History Main Topics  . Smoking status: Former Research scientist (life sciences)  . Smokeless tobacco: Former Systems developer  . Alcohol use No  . Drug use: No  . Sexual activity: No   Other Topics Concern  . Not on file   Social History Narrative  . No narrative on file    I appreciate the opportunity to take part in Elanie's care. Please do not hesitate to contact me with questions.  Sincerely,   R. Edgar Frisk, MD

## 2016-10-15 NOTE — Assessment & Plan Note (Signed)
   Prednisone has been provided (as above).    For now and during respiratory tract infections or asthma flares, increase Flovent 110 g to 3 inhalations 3 times per day.  Once his symptoms have returned baseline resume 2 inhalations twice a day.   Continue montelukast 10 mg daily bedtime and albuterol HFA, 1-2 inhalations every 4-6 hours as needed and 15 minutes prior to exercise.

## 2016-10-15 NOTE — Assessment & Plan Note (Addendum)
   A prescription has been provided for doxycycline 100 mg by mouth twice a day 10 days.  Prednisone has been provided, 20 mg x 4 days, 10 mg x1 day, then stop.  Nasonex, 2 sprays per nostril daily.  Nasal saline lavage (NeilMed) as needed has been recommended along with instructions for proper administration.  For thick post nasal drainage, add guaifenesin [phone] (Mucinex)  twice daily as needed with adequate hydration as discussed.  The patient has been asked to contact our office if her symptoms persist or progress. Otherwise, she may return for follow up in 4 months.

## 2016-10-15 NOTE — Patient Instructions (Addendum)
Acute sinusitis  A prescription has been provided for doxycycline 100 mg by mouth twice a day 10 days.  Prednisone has been provided, 20 mg x 4 days, 10 mg x1 day, then stop.  Nasonex, 2 sprays per nostril daily.  Nasal saline lavage (NeilMed) as needed has been recommended along with instructions for proper administration.  For thick post nasal drainage, add guaifenesin [phone] mg (Mucinex)  twice daily as needed with adequate hydration as discussed.  The patient has been asked to contact our office if her symptoms persist or progress. Otherwise, she may return for follow up in 4 months.  Asthma with acute exacerbation  Prednisone has been provided (as above).    For now and during respiratory tract infections or asthma flares, increase Flovent 110 g to 3 inhalations 3 times per day.  Once his symptoms have returned baseline resume 2 inhalations twice a day.   Continue montelukast 10 mg daily bedtime and albuterol HFA, 1-2 inhalations every 4-6 hours as needed and 15 minutes prior to exercise.   Return in about 4 months (around 02/14/2017), or if symptoms worsen or fail to improve.

## 2016-10-17 MED ORDER — ALBUTEROL SULFATE HFA 108 (90 BASE) MCG/ACT IN AERS: 2.0000 | INHALATION_SPRAY | RESPIRATORY_TRACT | 1 refills | Status: DC | PRN

## 2016-10-17 NOTE — Addendum Note (Signed)
Addended by: Martyn Malay on: 10/17/2016 12:22 PM   Modules accepted: Orders

## 2016-10-21 ENCOUNTER — Telehealth: Payer: Self-pay | Admitting: Allergy and Immunology

## 2016-10-21 MED ORDER — ALBUTEROL SULFATE (2.5 MG/3ML) 0.083% IN NEBU: 2.5000 mg | INHALATION_SOLUTION | Freq: Four times a day (QID) | RESPIRATORY_TRACT | 3 refills | Status: DC | PRN

## 2016-10-21 MED ORDER — OLOPATADINE HCL 0.1 % OP SOLN: 1.0000 [drp] | Freq: Two times a day (BID) | OPHTHALMIC | 5 refills | Status: DC

## 2016-10-21 NOTE — Telephone Encounter (Signed)
Sent scripts into pharmacy.

## 2016-10-21 NOTE — Telephone Encounter (Signed)
Pt called on Thursday and carrie was suppose to call in albuterol solution for ned and eye drops called into [facility] [address] [phone].

## 2016-10-29 ENCOUNTER — Telehealth: Payer: Self-pay | Admitting: Allergy and Immunology

## 2016-10-29 ENCOUNTER — Other Ambulatory Visit: Payer: Self-pay

## 2016-10-29 MED ORDER — AZITHROMYCIN 250 MG PO TABS: ORAL_TABLET | ORAL | 0 refills | Status: DC

## 2016-10-29 NOTE — Telephone Encounter (Signed)
Please provide a prescription for azithromycin, 500 mg on day 1 and 250 mg on days 2 through 5.

## 2016-10-29 NOTE — Telephone Encounter (Signed)
Called and informed patient that we will be sending in Azithromycin and instructed her on how to take it. Patient understood and is thankful.

## 2016-10-29 NOTE — Telephone Encounter (Signed)
Patient states she has had chills and they come and go. She said her chest is much clearer but it feels like she has a bucket on her head.

## 2016-10-29 NOTE — Telephone Encounter (Signed)
Pt states that she has had chills, the drainage out of her nose never stops, and its very watery. She is using saline solution. She stated it has gotten thicker since Friday.Mainly left side. She has what she calls a loose cough. She is able to get up the mucus she is coughing.   Please advise

## 2016-10-29 NOTE — Telephone Encounter (Signed)
Dr Bobbitt, please advise:   

## 2016-10-29 NOTE — Telephone Encounter (Signed)
Patient was seen 10-15-16, by Dr. Verlin Fester. She was given medications and has taken them all. She has gotten better, but she is still having sinus issues and has green mucus coming out of both nostrils. She has been using her saline solution and that helps to clean it out, then it returns. Wants to know if something else can be called in for her.

## 2016-10-29 NOTE — Telephone Encounter (Signed)
Does she have fevers, chills, etc, or just green mucus?

## 2016-12-10 ENCOUNTER — Other Ambulatory Visit: Payer: Self-pay | Admitting: Allergy and Immunology

## 2016-12-10 DIAGNOSIS — J45901 Unspecified asthma with (acute) exacerbation: Secondary | ICD-10-CM

## 2016-12-10 DIAGNOSIS — J3089 Other allergic rhinitis: Secondary | ICD-10-CM

## 2017-03-03 ENCOUNTER — Other Ambulatory Visit: Payer: Self-pay

## 2017-03-03 DIAGNOSIS — J45901 Unspecified asthma with (acute) exacerbation: Secondary | ICD-10-CM

## 2017-03-03 DIAGNOSIS — J3089 Other allergic rhinitis: Secondary | ICD-10-CM

## 2017-03-03 MED ORDER — MONTELUKAST SODIUM 10 MG PO TABS: 10.0000 mg | ORAL_TABLET | Freq: Every day | ORAL | 0 refills | Status: DC

## 2017-03-03 NOTE — Telephone Encounter (Signed)
90 day refill denied. Needs a follow up appt. Courtesy 30 day refill sent.

## 2017-05-27 ENCOUNTER — Other Ambulatory Visit: Payer: Self-pay | Admitting: Allergy and Immunology

## 2017-05-27 DIAGNOSIS — J45901 Unspecified asthma with (acute) exacerbation: Secondary | ICD-10-CM

## 2017-05-27 DIAGNOSIS — J3089 Other allergic rhinitis: Secondary | ICD-10-CM

## 2017-07-10 ENCOUNTER — Other Ambulatory Visit: Payer: Self-pay | Admitting: Allergy and Immunology

## 2017-07-10 DIAGNOSIS — J3089 Other allergic rhinitis: Secondary | ICD-10-CM

## 2017-07-10 DIAGNOSIS — J4540 Moderate persistent asthma, uncomplicated: Secondary | ICD-10-CM

## 2017-10-17 ENCOUNTER — Other Ambulatory Visit: Payer: Self-pay | Admitting: Allergy and Immunology

## 2017-10-17 DIAGNOSIS — J45901 Unspecified asthma with (acute) exacerbation: Secondary | ICD-10-CM

## 2017-10-17 DIAGNOSIS — J3089 Other allergic rhinitis: Secondary | ICD-10-CM

## 2017-10-24 ENCOUNTER — Other Ambulatory Visit: Payer: Self-pay | Admitting: Allergy and Immunology

## 2017-10-24 DIAGNOSIS — J3089 Other allergic rhinitis: Secondary | ICD-10-CM

## 2017-10-24 DIAGNOSIS — J45901 Unspecified asthma with (acute) exacerbation: Secondary | ICD-10-CM

## 2017-11-11 ENCOUNTER — Encounter: Payer: Self-pay | Admitting: Allergy and Immunology

## 2017-11-11 ENCOUNTER — Ambulatory Visit: Payer: Medicare Other | Admitting: Allergy and Immunology

## 2017-11-11 VITALS — BP 120/70 | HR 83 | Temp 98.0°F | Resp 20 | Ht 59.2 in | Wt 152.8 lb

## 2017-11-11 DIAGNOSIS — K219 Gastro-esophageal reflux disease without esophagitis: Secondary | ICD-10-CM | POA: Diagnosis not present

## 2017-11-11 DIAGNOSIS — J45901 Unspecified asthma with (acute) exacerbation: Secondary | ICD-10-CM | POA: Diagnosis not present

## 2017-11-11 DIAGNOSIS — J3089 Other allergic rhinitis: Secondary | ICD-10-CM | POA: Diagnosis not present

## 2017-11-11 DIAGNOSIS — J4540 Moderate persistent asthma, uncomplicated: Secondary | ICD-10-CM

## 2017-11-11 MED ORDER — MOMETASONE FUROATE 50 MCG/ACT NA SUSP: 2.0000 | Freq: Every day | NASAL | 5 refills | Status: DC | PRN

## 2017-11-11 MED ORDER — FLUTICASONE PROPIONATE HFA 110 MCG/ACT IN AERO: 2.0000 | INHALATION_SPRAY | Freq: Two times a day (BID) | RESPIRATORY_TRACT | 5 refills | Status: DC

## 2017-11-11 MED ORDER — MONTELUKAST SODIUM 10 MG PO TABS: 10.0000 mg | ORAL_TABLET | Freq: Every day | ORAL | 1 refills | Status: DC

## 2017-11-11 MED ORDER — ALBUTEROL SULFATE HFA 108 (90 BASE) MCG/ACT IN AERS: 2.0000 | INHALATION_SPRAY | RESPIRATORY_TRACT | 1 refills | Status: DC | PRN

## 2017-11-11 NOTE — Assessment & Plan Note (Signed)
   Continue appropriate reflux lifestyle modifications and lansoprazole (Prevacid) as prescribed.  Primary care physician will follow bone density. 

## 2017-11-11 NOTE — Assessment & Plan Note (Signed)
Currently well controlled.  Continue Flovent 110 g, 2 inhalations via spacer device twice a day, montelukast 10 mg daily bedtime, and albuterol HFA, 1-2 inhalations every 4-6 hours as needed and 15 minutes prior to exercise.  During respiratory tract infections or asthma flares, increase Flovent 110 g to 3 inhalations 3 times per day until symptoms have returned to baseline.  Subjective and objective measures of pulmonary function will be followed and the treatment plan will be adjusted accordingly.

## 2017-11-11 NOTE — Assessment & Plan Note (Addendum)
Stable.  Continue appropriate allergen avoidance measures and Nasonex, 2 sprays per nostril daily as needed.  Nasal saline spray (i.e., Simply Saline) or nasal saline lavage (i.e., NeilMed) is recommended as needed and prior to medicated nasal sprays. 

## 2017-11-11 NOTE — Progress Notes (Signed)
Follow-up Note  RE: Zilla Shartzer MRN: [id number] DOB: [birth year] Date of Office Visit: 11/11/2017  Primary care provider: Derrill Center., MD Referring provider: Derrill Center., MD  History of present illness: Raegen Tarpley is a 77 y.o. female with persistent asthma, allergic rhinitis, and esophageal reflux presenting today for sick visit.  She was last seen in this clinic in July 2018.  Reports that in December 2018 she had sinusitis, bronchitis, and "a terrible cough" requiring antibiotics and steroids.  However, since that time her upper and lower respiratory symptoms have been relatively well controlled.  This past Saturday she had been out in the rain and had a mild cough and nasal congestion later that night, however her symptoms are currently well controlled.  She is currently taking Flovent 110 g, 2 inhalations via spacer twice twice daily, and montelukast 10 mg daily bedtime as well as nasal steroid spray as needed.  She reports that her acid reflux is well controlled with Prevacid.  Assessment and plan: Moderate persistent asthma Currently well controlled.  Continue Flovent 110 g, 2 inhalations via spacer device twice a day, montelukast 10 mg daily bedtime, and albuterol HFA, 1-2 inhalations every 4-6 hours as needed and 15 minutes prior to exercise.  During respiratory tract infections or asthma flares, increase Flovent 110 g to 3 inhalations 3 times per day until symptoms have returned to baseline.  Subjective and objective measures of pulmonary function will be followed and the treatment plan will be adjusted accordingly.  Allergic rhinitis Stable.  Continue appropriate allergen avoidance measures and Nasonex, 2 sprays per nostril daily as needed.  Nasal saline spray (i.e., Simply Saline) or nasal saline lavage (i.e., NeilMed) is recommended as needed and prior to medicated nasal sprays.  GERD (gastroesophageal reflux disease)  Continue appropriate reflux lifestyle  modifications and lansoprazole (Prevacid) as prescribed.  Primary care physician will follow bone density.   Meds ordered this encounter  Medications  . fluticasone (FLOVENT HFA) 110 MCG/ACT inhaler    Sig: Inhale 2 puffs into the lungs 2 (two) times daily.    Dispense:  1 Inhaler    Refill:  5  . montelukast (SINGULAIR) 10 MG tablet    Sig: Take 1 tablet (10 mg total) by mouth at bedtime.    Dispense:  90 tablet    Refill:  1    Patient needs office visit.  Marland Kitchen albuterol (PROAIR HFA) 108 (90 Base) MCG/ACT inhaler    Sig: Inhale 2 puffs into the lungs every 4 (four) hours as needed for wheezing or shortness of breath.    Dispense:  1 Inhaler    Refill:  1  . mometasone (NASONEX) 50 MCG/ACT nasal spray    Sig: Place 2 sprays into the nose daily as needed.    Dispense:  17 g    Refill:  5    Diagnostics: Spirometry reveals an FVC of 2.01 L and an FEV1 of 1.50 L (90% predicted) with an FEV1 ratio of 100%.  Please see scanned spirometry results for details.    Physical examination: Blood pressure 120/70, pulse 83, temperature 98 F (36.7 C), temperature source Oral, resp. rate 20, height 4' 11.2" (1.504 m), weight 152 lb 12.8 oz (69.3 kg), SpO2 98 %.  General: Alert, interactive, in no acute distress. HEENT: TMs pearly gray, turbinates mildly edematous without discharge, post-pharynx mildly erythematous. Neck: Supple without lymphadenopathy. Lungs: Clear to auscultation without wheezing, rhonchi or rales. CV: Normal S1, S2 without murmurs. Skin: Warm and  dry, without lesions or rashes.  The following portions of the patient's history were reviewed and updated as appropriate: allergies, current medications, past family history, past medical history, past social history, past surgical history and problem list.  Allergies as of 11/11/2017      Reactions   Cefaclor Itching   Codeine Itching, Nausea And Vomiting   Fructose Swelling   Swelling and GI upset   Sulfa Antibiotics  Swelling   Meperidine Nausea And Vomiting, Itching   Sulfur Swelling   Other Nausea And Vomiting      Medication List        Accurate as of 11/11/17  5:04 PM. Always use your most recent med list.          albuterol (2.5 MG/3ML) 0.083% nebulizer solution Commonly known as:  PROVENTIL Take 3 mLs (2.5 mg total) by nebulization every 6 (six) hours as needed for wheezing or shortness of breath.   albuterol 108 (90 Base) MCG/ACT inhaler Commonly known as:  PROVENTIL HFA;VENTOLIN HFA Inhale 2 puffs into the lungs every 4 (four) hours as needed for wheezing or shortness of breath.   aspirin 81 MG chewable tablet Chew by mouth.   azithromycin 250 MG tablet Commonly known as:  ZITHROMAX Take 500 mg on the first day and 250 mg on days 2 through 5.   benzonatate 100 MG capsule Commonly known as:  TESSALON Take 1 capsule (100 mg total) by mouth 3 (three) times daily as needed for cough.   Biotin 1000 MCG tablet Take by mouth.   buPROPion 150 MG 12 hr tablet Commonly known as:  WELLBUTRIN SR Take by mouth.   calcium carbonate 600 MG Tabs tablet Commonly known as:  OS-CAL Take by mouth.   clobetasol cream 0.05 % Commonly known as:  TEMOVATE Apply topically.   clotrimazole-betamethasone cream Commonly known as:  LOTRISONE APPLY TO AFFECTED AREA IN THE MORNING & THEN BEDTIME FOR 2 WEEKS   fexofenadine 180 MG tablet Commonly known as:  ALLEGRA Take by mouth.   fluocinonide cream 0.05 % Commonly known as:  LIDEX Apply topically.   fluticasone 110 MCG/ACT inhaler Commonly known as:  FLOVENT HFA Inhale 2 puffs into the lungs 2 (two) times daily.   hydrochlorothiazide 25 MG tablet Commonly known as:  HYDRODIURIL Take 25 mg by mouth daily.   HYDROcodone-acetaminophen 5-325 MG tablet Commonly known as:  NORCO/VICODIN Take by mouth. Reported on 09/28/2015   ketotifen 0.025 % ophthalmic solution Commonly known as:  ZADITOR   KLOR-CON M10 10 MEQ tablet Generic drug:   potassium chloride   lansoprazole 30 MG capsule Commonly known as:  PREVACID Pt must keep appt at  [facility] for additional refills.   lisinopril 10 MG tablet Commonly known as:  PRINIVIL,ZESTRIL TAKE ONE TABLET BY MOUTH DAILY   mometasone 50 MCG/ACT nasal spray Commonly known as:  NASONEX PLACE 2 SPRAYS INTO THE NOSE DAILY.   mometasone 50 MCG/ACT nasal spray Commonly known as:  NASONEX Place 2 sprays into the nose daily as needed.   montelukast 10 MG tablet Commonly known as:  SINGULAIR Take 1 tablet (10 mg total) by mouth at bedtime.   multivitamin tablet Take by mouth.   olopatadine 0.1 % ophthalmic solution Commonly known as:  PATANOL Place 1 drop into both eyes 2 (two) times daily.   Polyethyl Glycol-Propyl Glycol 0.4-0.3 % Soln Apply 0.4 drops to eye as needed.   rosuvastatin 5 MG tablet Commonly known as:  CRESTOR Take by mouth.  VITAMIN D-1000 MAX ST 1000 units tablet Generic drug:  Cholecalciferol Take by mouth.       Allergies  Allergen Reactions  . Cefaclor Itching  . Codeine Itching and Nausea And Vomiting  . Fructose Swelling    Swelling and GI upset  . Sulfa Antibiotics Swelling  . Meperidine Nausea And Vomiting and Itching  . Sulfur Swelling  . Other Nausea And Vomiting   Review of systems: Review of systems negative except as noted in HPI / PMHx or noted below: Constitutional: Negative.  HENT: Negative.   Eyes: Negative.  Respiratory: Negative.   Cardiovascular: Negative.  Gastrointestinal: Negative.  Genitourinary: Negative.  Musculoskeletal: Negative.  Neurological: Negative.  Endo/Heme/Allergies: Negative.  Cutaneous: Negative.  Past Medical History:  Diagnosis Date  . Asthma   . Hypertension     Family History  Problem Relation Age of Onset  . Allergic rhinitis Neg Hx   . Angioedema Neg Hx   . Asthma Neg Hx   . Eczema Neg Hx   . Immunodeficiency Neg Hx   . Urticaria Neg Hx     Social History   Socioeconomic History   . Marital status: Married    Spouse name: Not on file  . Number of children: Not on file  . Years of education: Not on file  . Highest education level: Not on file  Occupational History  . Not on file  Social Needs  . Financial resource strain: Not on file  . Food insecurity:    Worry: Not on file    Inability: Not on file  . Transportation needs:    Medical: Not on file    Non-medical: Not on file  Tobacco Use  . Smoking status: Former Research scientist (life sciences)  . Smokeless tobacco: Former Network engineer and Sexual Activity  . Alcohol use: No    Alcohol/week: 0.0 standard drinks  . Drug use: No  . Sexual activity: Never  Lifestyle  . Physical activity:    Days per week: Not on file    Minutes per session: Not on file  . Stress: Not on file  Relationships  . Social connections:    Talks on phone: Not on file    Gets together: Not on file    Attends religious service: Not on file    Active member of club or organization: Not on file    Attends meetings of clubs or organizations: Not on file    Relationship status: Not on file  . Intimate partner violence:    Fear of current or ex partner: Not on file    Emotionally abused: Not on file    Physically abused: Not on file    Forced sexual activity: Not on file  Other Topics Concern  . Not on file  Social History Narrative  . Not on file    I appreciate the opportunity to take part in Perline's care. Please do not hesitate to contact me with questions.  Sincerely,   R. Edgar Frisk, MD

## 2017-11-11 NOTE — Patient Instructions (Addendum)
Moderate persistent asthma Currently well controlled.  Continue Flovent 110 g, 2 inhalations via spacer device twice a day, montelukast 10 mg daily bedtime, and albuterol HFA, 1-2 inhalations every 4-6 hours as needed and 15 minutes prior to exercise.  During respiratory tract infections or asthma flares, increase Flovent 110 g to 3 inhalations 3 times per day until symptoms have returned to baseline.  Subjective and objective measures of pulmonary function will be followed and the treatment plan will be adjusted accordingly.  Allergic rhinitis Stable.  Continue appropriate allergen avoidance measures and Nasonex, 2 sprays per nostril daily as needed.  Nasal saline spray (i.e., Simply Saline) or nasal saline lavage (i.e., NeilMed) is recommended as needed and prior to medicated nasal sprays.  GERD (gastroesophageal reflux disease)  Continue appropriate reflux lifestyle modifications and lansoprazole (Prevacid) as prescribed.  Primary care physician will follow bone density.   Return in about 6 months (around 05/14/2018), or if symptoms worsen or fail to improve.

## 2018-03-31 ENCOUNTER — Other Ambulatory Visit: Payer: Self-pay | Admitting: Allergy and Immunology

## 2018-04-30 ENCOUNTER — Encounter: Payer: Self-pay | Admitting: Cardiology

## 2018-04-30 ENCOUNTER — Ambulatory Visit (INDEPENDENT_AMBULATORY_CARE_PROVIDER_SITE_OTHER): Payer: Medicare Other | Admitting: Cardiology

## 2018-04-30 DIAGNOSIS — I422 Other hypertrophic cardiomyopathy: Secondary | ICD-10-CM

## 2018-04-30 DIAGNOSIS — I340 Nonrheumatic mitral (valve) insufficiency: Secondary | ICD-10-CM | POA: Insufficient documentation

## 2018-04-30 DIAGNOSIS — I493 Ventricular premature depolarization: Secondary | ICD-10-CM | POA: Insufficient documentation

## 2018-04-30 DIAGNOSIS — I119 Hypertensive heart disease without heart failure: Secondary | ICD-10-CM

## 2018-04-30 DIAGNOSIS — I447 Left bundle-branch block, unspecified: Secondary | ICD-10-CM | POA: Insufficient documentation

## 2018-04-30 HISTORY — DX: Nonrheumatic mitral (valve) insufficiency: I34.0

## 2018-04-30 HISTORY — DX: Ventricular premature depolarization: I49.3

## 2018-04-30 HISTORY — DX: Left bundle-branch block, unspecified: I44.7

## 2018-04-30 HISTORY — DX: Hypertensive heart disease without heart failure: I11.9

## 2018-04-30 HISTORY — DX: Other hypertrophic cardiomyopathy: I42.2

## 2018-04-30 NOTE — Progress Notes (Signed)
Cardiology Office Note:    Date:  04/30/2018   ID:  Laurie Bowman, DOB Sep 18, 1940, MRN [id number]  PCP:  Derrill Center., MD  Cardiologist:  Shirlee More, MD    Referring MD: Derrill Center., MD    ASSESSMENT:    1. Hypertrophic cardiomyopathy ([place])   2. Nonrheumatic mitral valve regurgitation   3. LBBB (left bundle branch block)   4. Hypertensive heart disease without heart failure   5. PVC's (premature ventricular contractions)    PLAN:    In order of problems listed above 1. Stable asymptomatic New York Heart Association class I she has had a very nice response to calcium channel blocker as durable and will continue the same.  She voiced understanding of the nature of her disorder will participate in activities without restriction 2. Stable mild 3. Stable EKG pattern without any symptoms to suggest significant bradycardia 4. Stable blood pressure target and continue current regimen including thiazide diuretic calcium channel blocker 5. Stable asymptomatic no PVCs obvious on her EKG 6. Stable hyperlipidemia continue her statin   Next appointment: 1 year   Medication Adjustments/Labs and Tests Ordered: Current medicines are reviewed at length with the patient today.  Concerns regarding medicines are outlined above.  No orders of the defined types were placed in this encounter.  No orders of the defined types were placed in this encounter.   Chief Complaint  Patient presents with  . Follow-up  . Cardiomyopathy    hypertrophic with LBBB  . Mitral Regurgitation    with PVC's  . Hypertension    History of Present Illness:    Laurie Bowman is a 78 y.o. female with a hx of obstructive hypertrophic cardiomyopathy mitral regurgitation hypertension left bundle branch block frequent PVCs and hyperlipidemia last seen by Dr. Wynonia Lawman 09/09/2017. Compliance with diet, lifestyle and medications: Yes  1 year ago Dr. Wynonia Lawman tried to uptitrate her calcium channel blocker she had  symptomatic hypotension has remained on her previous dose and is quite pleased with the response.  She exercises has had no lightheadedness chest pain shortness of breath palpitation or syncope.  She understands that she has an acquired form of hypertrophic cardiomyopathy nongenetic and excessive response to hypertension and is not at increased risk of sudden death.  Her EKG is stable in my office left bundle branch block and her echocardiogram was last performed in May 2019. Past Medical History:  Diagnosis Date  . Asthma   . Cancer ([place])   . Hypertension     Past Surgical History:  Procedure Laterality Date  . ABDOMINAL ADHESION SURGERY    . BREAST SURGERY    . carpel tunnel    . CESAREAN SECTION    . KNEE SURGERY    . LEG SURGERY     bone graft  . TONSILLECTOMY    . TONSILLECTOMY      Current Medications: Current Meds  Medication Sig  . albuterol (PROAIR HFA) 108 (90 Base) MCG/ACT inhaler Inhale 2 puffs into the lungs every 4 (four) hours as needed for wheezing or shortness of breath.  Marland Kitchen albuterol (PROVENTIL) (2.5 MG/3ML) 0.083% nebulizer solution Take 3 mLs (2.5 mg total) by nebulization every 6 (six) hours as needed for wheezing or shortness of breath.  Marland Kitchen aspirin 81 MG chewable tablet Chew 81 mg by mouth daily.   Marland Kitchen augmented betamethasone dipropionate (DIPROLENE-AF) 0.05 % cream Apply 6.30 application topically daily.  . benzonatate (TESSALON) 100 MG capsule Take 1 capsule (100 mg total) by  mouth 3 (three) times daily as needed for cough.  . Biotin 1000 MCG tablet Take 1,000 mcg by mouth daily.   Marland Kitchen buPROPion (WELLBUTRIN SR) 150 MG 12 hr tablet Take 150 mg by mouth daily.   . calcium carbonate (OS-CAL) 600 MG TABS tablet Take 1 tablet by mouth 2 (two) times daily.   . Cholecalciferol (VITAMIN D-1000 MAX ST) 1000 units tablet Take 1,000 Units by mouth daily.   . clobetasol cream (TEMOVATE) 0.05 % Apply topically.  . clotrimazole-betamethasone (LOTRISONE) cream APPLY TO AFFECTED  AREA IN THE MORNING & THEN BEDTIME FOR 2 WEEKS  . dicyclomine (BENTYL) 20 MG tablet TAKE 1 TABLET (20 MG TOTAL) BY MOUTH EVERY 6 (SIX) HOURS AS NEEDED.  . fexofenadine (ALLEGRA) 180 MG tablet Take 180 mg by mouth daily.   . fluocinonide cream (LIDEX) 3.15 % Apply 1 application topically 2 (two) times daily.   . fluticasone (FLOVENT HFA) 110 MCG/ACT inhaler Inhale 1 puff into the lungs 2 (two) times daily.  . hydrochlorothiazide (HYDRODIURIL) 25 MG tablet Take 25 mg by mouth daily.  . lansoprazole (PREVACID) 30 MG capsule Take 30 mg by mouth 2 (two) times daily before a meal.   . mometasone (NASONEX) 50 MCG/ACT nasal spray Place 2 sprays into the nose daily as needed.  . montelukast (SINGULAIR) 10 MG tablet TAKE 1 TABLET BY MOUTH EVERYDAY AT BEDTIME  . olopatadine (PATANOL) 0.1 % ophthalmic solution Place 1 drop into both eyes 2 (two) times daily.  . Polyethylene Glycol 3350 (PEG 3350) POWD Take 17 g by mouth daily as needed.  . potassium chloride (KLOR-CON M10) 10 MEQ tablet 10 mEq 2 (two) times daily.   . rosuvastatin (CRESTOR) 5 MG tablet Take 5 mg by mouth daily.   . verapamil (CALAN-SR) 180 MG CR tablet TAKE 1 TABLET BY MOUTH EVERY DAY   Current Facility-Administered Medications for the 04/30/18 encounter (Office Visit) with Richardo Priest, MD  Medication  . predniSONE (DELTASONE) tablet 10 mg     Allergies:   Cefaclor; Codeine; Fructose; Sulfa antibiotics; Meperidine; Sulfur; and Other   Social History   Socioeconomic History  . Marital status: Married    Spouse name: Not on file  . Number of children: Not on file  . Years of education: Not on file  . Highest education level: Not on file  Occupational History  . Not on file  Social Needs  . Financial resource strain: Not on file  . Food insecurity:    Worry: Not on file    Inability: Not on file  . Transportation needs:    Medical: Not on file    Non-medical: Not on file  Tobacco Use  . Smoking status: Former Research scientist (life sciences)  .  Smokeless tobacco: Former Network engineer and Sexual Activity  . Alcohol use: No    Alcohol/week: 0.0 standard drinks  . Drug use: No  . Sexual activity: Never  Lifestyle  . Physical activity:    Days per week: Not on file    Minutes per session: Not on file  . Stress: Not on file  Relationships  . Social connections:    Talks on phone: Not on file    Gets together: Not on file    Attends religious service: Not on file    Active member of club or organization: Not on file    Attends meetings of clubs or organizations: Not on file    Relationship status: Not on file  Other Topics Concern  .  Not on file  Social History Narrative  . Not on file     Family History: The patient's family history includes Alzheimer's disease in her sister; Breast cancer in her sister; Congestive Heart Failure in her mother; Dementia in her sister; Hyperlipidemia in her sister and sister; Hypertension in her mother; Stroke in her maternal grandmother. There is no history of Allergic rhinitis, Angioedema, Asthma, Eczema, Immunodeficiency, or Urticaria. ROS:   Please see the history of present illness.    All other systems reviewed and are negative.  EKGs/Labs/Other Studies Reviewed:    The following studies were reviewed today:  EKG:  EKG ordered today.  The ekg ordered today demonstrates sinus rhythm stable pattern left bundle branch block  Recent Labs: No results found for requested labs within last 8760 hours.  Recent Lipid Panel No results found for: CHOL, TRIG, HDL, CHOLHDL, VLDL, LDLCALC, LDLDIRECT  Physical Exam:    VS:  BP 130/84 (BP Location: Left Arm, Patient Position: Sitting, Cuff Size: Normal)   Pulse 81   Ht 5' (1.524 m)   Wt 150 lb 12.8 oz (68.4 kg)   SpO2 97%   BMI 29.45 kg/m     Wt Readings from Last 3 Encounters:  04/30/18 150 lb 12.8 oz (68.4 kg)  11/11/17 152 lb 12.8 oz (69.3 kg)  06/10/16 151 lb 9.6 oz (68.8 kg)     GEN:  Well nourished, well developed in no acute  distress HEENT: Normal NECK: No JVD; No carotid bruits LYMPHATICS: No lymphadenopathy CARDIAC: RRR, is a minimal grade 1/6 localized aortic systolic ejection murmur S2 normal no radiation, rubs, gallops RESPIRATORY:  Clear to auscultation without rales, wheezing or rhonchi  ABDOMEN: Soft, non-tender, non-distended MUSCULOSKELETAL:  No edema; No deformity  SKIN: Warm and dry NEUROLOGIC:  Alert and oriented x 3 PSYCHIATRIC:  Normal affect    Signed, Shirlee More, MD  04/30/2018 1:59 PM    [facility]

## 2018-04-30 NOTE — Patient Instructions (Signed)

## 2018-05-11 ENCOUNTER — Ambulatory Visit: Payer: Medicare Other | Admitting: Allergy and Immunology

## 2018-05-12 ENCOUNTER — Ambulatory Visit: Payer: Medicare Other | Admitting: Allergy and Immunology

## 2018-06-09 ENCOUNTER — Telehealth: Payer: Self-pay | Admitting: Allergy and Immunology

## 2018-06-09 MED ORDER — MONTELUKAST SODIUM 10 MG PO TABS: ORAL_TABLET | ORAL | 1 refills | Status: DC

## 2018-06-09 NOTE — Telephone Encounter (Signed)
Patient had to move her appt out to may due to being high risk for COVID19 Can courtesy refills be sent in at this time Patient now has an appt in May 2020

## 2018-06-09 NOTE — Telephone Encounter (Signed)
Singulair has been sent to the patient's pharmacy. Called and advised patient. Patient verbalized understanding.

## 2018-06-15 ENCOUNTER — Ambulatory Visit: Payer: Medicare Other | Admitting: Allergy and Immunology

## 2018-07-09 ENCOUNTER — Other Ambulatory Visit: Payer: Self-pay | Admitting: Allergy and Immunology

## 2018-07-13 ENCOUNTER — Other Ambulatory Visit: Payer: Self-pay

## 2018-07-13 MED ORDER — ALBUTEROL SULFATE (2.5 MG/3ML) 0.083% IN NEBU: 2.5000 mg | INHALATION_SOLUTION | Freq: Four times a day (QID) | RESPIRATORY_TRACT | 1 refills | Status: DC | PRN

## 2018-07-13 NOTE — Telephone Encounter (Signed)
Patient called and r/s her appt to June 29 with Dr Verlin Fester. She did not want to do a telephone visit. She wants him to be able to listen to her. She is requesting a refill on her Albuterol for neb machine.   [facility] [place]

## 2018-07-20 ENCOUNTER — Ambulatory Visit: Payer: Medicare Other | Admitting: Allergy and Immunology

## 2018-09-07 ENCOUNTER — Other Ambulatory Visit: Payer: Self-pay | Admitting: *Deleted

## 2018-09-07 MED ORDER — MOMETASONE FUROATE 50 MCG/ACT NA SUSP: 2.0000 | Freq: Every day | NASAL | 0 refills | Status: DC | PRN

## 2018-09-14 ENCOUNTER — Ambulatory Visit (INDEPENDENT_AMBULATORY_CARE_PROVIDER_SITE_OTHER): Payer: Medicare Other | Admitting: Allergy and Immunology

## 2018-09-14 ENCOUNTER — Encounter: Payer: Self-pay | Admitting: Allergy and Immunology

## 2018-09-14 ENCOUNTER — Other Ambulatory Visit: Payer: Self-pay

## 2018-09-14 VITALS — BP 130/78 | HR 74 | Resp 16

## 2018-09-14 DIAGNOSIS — K219 Gastro-esophageal reflux disease without esophagitis: Secondary | ICD-10-CM | POA: Diagnosis not present

## 2018-09-14 DIAGNOSIS — J3089 Other allergic rhinitis: Secondary | ICD-10-CM

## 2018-09-14 DIAGNOSIS — J4540 Moderate persistent asthma, uncomplicated: Secondary | ICD-10-CM | POA: Diagnosis not present

## 2018-09-14 NOTE — Patient Instructions (Addendum)
Moderate persistent asthma Well controlled.  Continue Flovent 110 g, 2 inhalations twice a day, montelukast 10 mg daily bedtime, and albuterol HFA, 1-2 inhalations every 4-6 hours as needed and 15 minutes prior to exercise.   The montelukast boxed warning has been discussed and the patient has verbalized understanding.  During respiratory tract infections or asthma flares, increase Flovent 110 g to 3 inhalations 3 times per day until symptoms have returned to baseline.  To maximize pulmonary deposition, a spacer has been provided along with instructions for its proper administration with an HFA inhaler.  Subjective and objective measures of pulmonary function will be followed and the treatment plan will be adjusted accordingly.  Allergic rhinitis Stable.  Continue appropriate allergen avoidance measures and Nasonex, 2 sprays per nostril daily as needed.  Nasal saline spray (i.e., Simply Saline) or nasal saline lavage (i.e., NeilMed) is recommended as needed and prior to medicated nasal sprays.  GERD (gastroesophageal reflux disease)  Continue appropriate reflux lifestyle modifications and lansoprazole (Prevacid) as prescribed.  Primary care physician will follow bone density.   Return in about 4 months (around 01/14/2019), or if symptoms worsen or fail to improve.

## 2018-09-14 NOTE — Progress Notes (Signed)
Follow-up Note  RE: Craig Wisnewski MRN: [id number] DOB: 02-12-1941 Date of Office Visit: 09/14/2018  Primary care provider: Derrill Center., MD Referring provider: Derrill Center., MD  History of present illness: Laurie Bowman is a 78 y.o. female with persistent asthma, allergic rhinitis, and esophageal reflux presenting today for follow-up.  She was last seen in this clinic in August 2019.  She reports that in the interval since her previous visit her asthma has been well controlled.  She rarely requires albuterol rescue and does not experience limitations in normal daily activities or nocturnal awakenings due to lower respiratory symptoms.  She reports that she dropped her spacer device on tile floor and it broke so she has not been using a spacer device with her HFA inhalers.  She does experience occasional coughing at nighttime which she treats with Ladona Ridgel.  Her nasal allergy symptoms have been well controlled with Nasonex as needed.  Assessment and plan: Moderate persistent asthma Well controlled.  Continue Flovent 110 g, 2 inhalations twice a day, montelukast 10 mg daily bedtime, and albuterol HFA, 1-2 inhalations every 4-6 hours as needed and 15 minutes prior to exercise.   The montelukast boxed warning has been discussed and the patient has verbalized understanding.  During respiratory tract infections or asthma flares, increase Flovent 110 g to 3 inhalations 3 times per day until symptoms have returned to baseline.  To maximize pulmonary deposition, a spacer has been provided along with instructions for its proper administration with an HFA inhaler.  Subjective and objective measures of pulmonary function will be followed and the treatment plan will be adjusted accordingly.  Allergic rhinitis Stable.  Continue appropriate allergen avoidance measures and Nasonex, 2 sprays per nostril daily as needed.  Nasal saline spray (i.e., Simply Saline) or nasal saline lavage  (i.e., NeilMed) is recommended as needed and prior to medicated nasal sprays.  GERD (gastroesophageal reflux disease)  Continue appropriate reflux lifestyle modifications and lansoprazole (Prevacid) as prescribed.  Primary care physician will follow bone density.   Diagnostics: Spirometry:  Normal with an FEV1 of 93% predicted. This study was performed while the patient was asymptomatic.  Please see scanned spirometry results for details.    Physical examination: Blood pressure 130/78, pulse 74, resp. rate 16, SpO2 97 %.  General: Alert, interactive, in no acute distress. HEENT: TMs pearly gray, turbinates minimally edematous without discharge, post-pharynx mildly erythematous. Neck: Supple without lymphadenopathy. Lungs: Clear to auscultation without wheezing, rhonchi or rales. CV: Normal S1, S2 without murmurs. Skin: Warm and dry, without lesions or rashes.  The following portions of the patient's history were reviewed and updated as appropriate: allergies, current medications, past family history, past medical history, past social history, past surgical history and problem list.  Allergies as of 09/14/2018      Reactions   Cefaclor Itching   Other reaction(s): Flushing (ALLERGY/intolerance)   Codeine Itching, Nausea And Vomiting   Fructose Swelling   Swelling and GI upset Other reaction(s): GI Upset (intolerance) Fructose Granules    Sulfa Antibiotics Swelling   Meperidine Nausea And Vomiting, Itching   Sulfur Swelling   Other Nausea And Vomiting      Medication List       Accurate as of September 14, 2018  1:27 PM. If you have any questions, ask your nurse or doctor.        STOP taking these medications   halobetasol 0.05 % cream Commonly known as: ULTRAVATE Stopped by: Edmonia Lynch, MD  TAKE these medications   albuterol 108 (90 Base) MCG/ACT inhaler Commonly known as: ProAir HFA Inhale 2 puffs into the lungs every 4 (four) hours as needed for wheezing or  shortness of breath. What changed: Another medication with the same name was removed. Continue taking this medication, and follow the directions you see here. Changed by: Edmonia Lynch, MD   albuterol (2.5 MG/3ML) 0.083% nebulizer solution Commonly known as: PROVENTIL Take 3 mLs (2.5 mg total) by nebulization every 6 (six) hours as needed for wheezing or shortness of breath. What changed: Another medication with the same name was removed. Continue taking this medication, and follow the directions you see here. Changed by: Edmonia Lynch, MD   aspirin 81 MG chewable tablet Chew 81 mg by mouth daily.   augmented betamethasone dipropionate 0.05 % cream Commonly known as: DIPROLENE-AF Apply 4.82 application topically daily.   benzonatate 100 MG capsule Commonly known as: TESSALON What changed: Another medication with the same name was removed. Continue taking this medication, and follow the directions you see here. Changed by: Edmonia Lynch, MD   Biotin 1000 MCG tablet Take 1,000 mcg by mouth daily.   buPROPion 150 MG 12 hr tablet Commonly known as: WELLBUTRIN SR Take 150 mg by mouth daily.   calcium carbonate 600 MG Tabs tablet Commonly known as: OS-CAL Take 1 tablet by mouth 2 (two) times daily.   clobetasol cream 0.05 % Commonly known as: TEMOVATE What changed: Another medication with the same name was removed. Continue taking this medication, and follow the directions you see here. Changed by: Edmonia Lynch, MD   clotrimazole-betamethasone cream Commonly known as: LOTRISONE APPLY TO AFFECTED AREA IN THE MORNING & THEN BEDTIME FOR 2 WEEKS   dicyclomine 20 MG tablet Commonly known as: BENTYL TAKE 1 TABLET (20 MG TOTAL) BY MOUTH EVERY 6 (SIX) HOURS AS NEEDED.   fexofenadine 180 MG tablet Commonly known as: ALLEGRA Take 180 mg by mouth daily.   fluocinonide cream 0.05 % Commonly known as: LIDEX Apply 1 application topically 2 (two) times daily.    fluticasone 110 MCG/ACT inhaler Commonly known as: FLOVENT HFA Inhale 1 puff into the lungs 2 (two) times daily.   hydrochlorothiazide 25 MG tablet Commonly known as: HYDRODIURIL Take 25 mg by mouth daily.   Klor-Con M10 10 MEQ tablet Generic drug: potassium chloride 10 mEq 2 (two) times daily.   lansoprazole 30 MG capsule Commonly known as: PREVACID Take 30 mg by mouth 2 (two) times daily before a meal.   mometasone 50 MCG/ACT nasal spray Commonly known as: NASONEX Place 2 sprays into the nose daily as needed.   montelukast 10 MG tablet Commonly known as: SINGULAIR TAKE 1 TABLET BY MOUTH EVERYDAY AT BEDTIME   olopatadine 0.1 % ophthalmic solution Commonly known as: Patanol Place 1 drop into both eyes 2 (two) times daily.   PEG 3350 17 GM/SCOOP Powd Take 17 g by mouth daily as needed.   rosuvastatin 5 MG tablet Commonly known as: CRESTOR Take 5 mg by mouth daily.   verapamil 180 MG CR tablet Commonly known as: CALAN-SR TAKE 1 TABLET BY MOUTH EVERY DAY   Vitamin D-1000 Max St 25 MCG (1000 UT) tablet Generic drug: Cholecalciferol Take 1,000 Units by mouth daily.       Allergies  Allergen Reactions  . Cefaclor Itching    Other reaction(s): Flushing (ALLERGY/intolerance)  . Codeine Itching and Nausea And Vomiting  . Fructose Swelling    Swelling and GI  upset Other reaction(s): GI Upset (intolerance) Fructose Granules   . Sulfa Antibiotics Swelling  . Meperidine Nausea And Vomiting and Itching  . Sulfur Swelling  . Other Nausea And Vomiting    I appreciate the opportunity to take part in Jaleiyah's care. Please do not hesitate to contact me with questions.  Sincerely,   R. Edgar Frisk, MD

## 2018-09-14 NOTE — Assessment & Plan Note (Signed)
Well controlled.  Continue Flovent 110 g, 2 inhalations twice a day, montelukast 10 mg daily bedtime, and albuterol HFA, 1-2 inhalations every 4-6 hours as needed and 15 minutes prior to exercise.   The montelukast boxed warning has been discussed and the patient has verbalized understanding.  During respiratory tract infections or asthma flares, increase Flovent 110 g to 3 inhalations 3 times per day until symptoms have returned to baseline.  To maximize pulmonary deposition, a spacer has been provided along with instructions for its proper administration with an HFA inhaler.  Subjective and objective measures of pulmonary function will be followed and the treatment plan will be adjusted accordingly.

## 2018-09-14 NOTE — Assessment & Plan Note (Signed)
   Continue appropriate reflux lifestyle modifications and lansoprazole (Prevacid) as prescribed.  Primary care physician will follow bone density.

## 2018-09-14 NOTE — Assessment & Plan Note (Signed)
Stable.  Continue appropriate allergen avoidance measures and Nasonex, 2 sprays per nostril daily as needed.  Nasal saline spray (i.e., Simply Saline) or nasal saline lavage (i.e., NeilMed) is recommended as needed and prior to medicated nasal sprays.

## 2018-10-23 ENCOUNTER — Telehealth: Payer: Self-pay | Admitting: Cardiology

## 2018-10-23 ENCOUNTER — Other Ambulatory Visit: Payer: Self-pay | Admitting: Cardiology

## 2018-10-23 MED ORDER — VERAPAMIL HCL ER 180 MG PO TBCR: 180.0000 mg | EXTENDED_RELEASE_TABLET | Freq: Every day | ORAL | 1 refills | Status: DC

## 2018-10-23 NOTE — Telephone Encounter (Signed)
Medication refilled

## 2018-10-23 NOTE — Telephone Encounter (Signed)
New Message    *STAT* If patient is at the pharmacy, call can be transferred to refill team.   1. Which medications need to be refilled? (please list name of each medication and dose if known) verapamil (CALAN-SR) 180 MG CR tablet    2. Which pharmacy/location (including street and city if local pharmacy) is medication to be sent to? [id number] - [place], [place]  3. Do they need a 30 day or 90 day supply? 90 day

## 2018-10-23 NOTE — Telephone Encounter (Signed)
Disregard opened error  ° °

## 2018-11-18 NOTE — Progress Notes (Signed)
Cardiology Office Note:    Date:  11/19/2018   ID:  Laurie Bowman, DOB [birth year], MRN [id number]  PCP:  Derrill Center., MD  Cardiologist:  Shirlee More, MD    Referring MD: Derrill Center., MD    ASSESSMENT:    1. Hypertrophic cardiomyopathy ([place])   2. LBBB (left bundle branch block)   3. Hypertensive heart disease without heart failure   4. PVC's (premature ventricular contractions)   5. Nonrheumatic mitral valve regurgitation    PLAN:    In order of problems listed above:  1. 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 2. I did not recheck an EKG today 3. Blood pressure target continue her calcium channel blocker 4. Asymptomatic at this time 5. Recheck echocardiogram regarding extent of MR   Next appointment: 6 months   Medication Adjustments/Labs and Tests Ordered: Current medicines are reviewed at length with the patient today.  Concerns regarding medicines are outlined above.  No orders of the defined types were placed in this encounter.  No orders of the defined types were placed in this encounter.   Chief Complaint  Patient presents with  . Follow-up  . Cardiomyopathy    History of Present Illness:    Laurie Bowman is a 78 y.o. 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 Hypertension, left bundle branch block, frequent PVCs and hyperlipidemia  last seen 04/30/2018. Compliance with diet, lifestyle and medications: Yes  Unfortunately COVID-19 presently not exercise on a regular basis she does not feels well she has had interventional ablation for venous insufficiency and she has knee pain she has no exercise intolerance but still has mild exertional shortness of breath and lightheaded when she shifts posture  no syncope or palpitation or chest pain.  Physical examination shows outflow murmur and I think she needs to recheck echocardiogram regarding LV outflow tract obstruction and if severe consider alternative therapy. Past Medical History:  Diagnosis Date  . Asthma   . Cancer ([place])   . Hypertension     Past Surgical History:  Procedure Laterality Date  . ABDOMINAL ADHESION SURGERY    . BREAST SURGERY    . carpel tunnel    . CESAREAN SECTION    . KNEE SURGERY    . LEG SURGERY     bone graft  . TONSILLECTOMY    . TONSILLECTOMY    . VEIN SURGERY     Right Leg    Current Medications: Current Meds  Medication Sig  . albuterol (PROAIR HFA) 108 (90 Base) MCG/ACT inhaler Inhale 2 puffs into the lungs every 4 (four) hours as needed for wheezing or shortness of breath.  Marland Kitchen albuterol (PROVENTIL) (2.5 MG/3ML) 0.083% nebulizer solution Take 3 mLs (2.5 mg total) by nebulization every 6 (six) hours as needed for wheezing or shortness of breath.  Marland Kitchen aspirin 81 MG chewable tablet Chew 81 mg by mouth daily.   Marland Kitchen augmented betamethasone dipropionate (DIPROLENE-AF) 0.05 % cream Apply [id number] application topically daily.  . benzonatate (TESSALON) 100 MG capsule Take 100 mg by mouth daily as needed.   . Biotin 1000 MCG tablet Take 1,000 mcg by mouth daily.   Marland Kitchen buPROPion (WELLBUTRIN SR) 150 MG 12 hr tablet Take 150 mg by mouth daily.   . calcium carbonate (OS-CAL) 600 MG TABS tablet Take 1 tablet  by mouth 2 (two) times daily.   . Cholecalciferol (VITAMIN D-1000 MAX ST) 1000 units tablet Take 1,000 Units by mouth daily.   . clobetasol cream (TEMOVATE) 0.05 %   . clotrimazole-betamethasone (LOTRISONE) cream APPLY TO AFFECTED AREA IN THE MORNING & THEN BEDTIME FOR 2 WEEKS  . dicyclomine (BENTYL) 20 MG tablet TAKE 1 TABLET (20 MG TOTAL) BY MOUTH EVERY 6 (SIX) HOURS AS NEEDED.  . fexofenadine (ALLEGRA) 180 MG tablet Take 180 mg by mouth daily.   . fluocinonide cream (LIDEX) [id number] % Apply 1 application topically 2  (two) times daily.   . fluticasone (FLOVENT HFA) 110 MCG/ACT inhaler Inhale 1 puff into the lungs 2 (two) times daily.  . hydrochlorothiazide (HYDRODIURIL) 25 MG tablet Take 25 mg by mouth daily.  . lansoprazole (PREVACID) 30 MG capsule Take 30 mg by mouth 2 (two) times daily before a meal.   . mometasone (NASONEX) 50 MCG/ACT nasal spray Place 2 sprays into the nose daily as needed.  . montelukast (SINGULAIR) 10 MG tablet TAKE 1 TABLET BY MOUTH EVERYDAY AT BEDTIME  . olopatadine (PATANOL) 0.1 % ophthalmic solution Place 1 drop into both eyes 2 (two) times daily.  . Polyethylene Glycol 3350 (PEG 3350) POWD Take 17 g by mouth daily.   . potassium chloride (KLOR-CON M10) 10 MEQ tablet 10 mEq 2 (two) times daily.   . rosuvastatin (CRESTOR) 5 MG tablet Take 5 mg by mouth daily.   . verapamil (CALAN-SR) 180 MG CR tablet Take 1 tablet (180 mg total) by mouth daily.   Current Facility-Administered Medications for the 11/19/18 encounter (Office Visit) with Richardo Priest, MD  Medication  . predniSONE (DELTASONE) tablet 10 mg     Allergies:   Cefaclor, Codeine, Fructose, Sulfa antibiotics, Meperidine, Sulfur, and Other   Social History   Socioeconomic History  . Marital status: Married    Spouse name: Not on file  . Number of children: Not on file  . Years of education: Not on file  . Highest education level: Not on file  Occupational History  . Not on file  Social Needs  . Financial resource strain: Not on file  . Food insecurity    Worry: Not on file    Inability: Not on file  . Transportation needs    Medical: Not on file    Non-medical: Not on file  Tobacco Use  . Smoking status: Former Smoker    Types: Cigarettes  . Smokeless tobacco: Never Used  Substance and Sexual Activity  . Alcohol use: No    Alcohol/week: 0.0 standard drinks  . Drug use: No  . Sexual activity: Never  Lifestyle  . Physical activity    Days per week: Not on file    Minutes per session: Not on file  .  Stress: Not on file  Relationships  . Social Herbalist on phone: Not on file    Gets together: Not on file    Attends religious service: Not on file    Active member of club or organization: Not on file    Attends meetings of clubs or organizations: Not on file    Relationship status: Not on file  Other Topics Concern  . Not on file  Social History Narrative  . Not on file     Family History: The patient's family history includes Alzheimer's disease in her sister; Breast cancer in her sister; Congestive Heart Failure in her mother; Dementia in her sister; Hyperlipidemia in her  sister and sister; Hypertension in her mother; Stroke in her maternal grandmother. There is no history of Allergic rhinitis, Angioedema, Asthma, Eczema, Immunodeficiency, or Urticaria. ROS:   Please see the history of present illness.    All other systems reviewed and are negative.  EKGs/Labs/Other Studies Reviewed:    The following studies were reviewed today:   Recent Labs: 08/21/2018 Cholesterol 125 HDL 41 LDL 67 Creatinine 0.99 potassium 4.3 normal liver function TSH normal CBC normal Physical Exam:    VS:  BP 126/80 (BP Location: Left Arm, Patient Position: Sitting, Cuff Size: Normal)   Pulse 74   Ht 5' (1.524 m)   Wt 153 lb (69.4 kg)   SpO2 98%   BMI 29.88 kg/m     Wt Readings from Last 3 Encounters:  11/19/18 153 lb (69.4 kg)  04/30/18 150 lb 12.8 oz (68.4 kg)  11/11/17 152 lb 12.8 oz (69.3 kg)     GEN:  Well nourished, well developed in no acute distress HEENT: Normal NECK: No JVD; No carotid bruits LYMPHATICS: No lymphadenopathy CARDIAC: Grade 2/6 to 3/6 harsh systolic ejection murmur left sternal border to the aortic area S2 normal RRR,  rubs, gallops RESPIRATORY:  Clear to auscultation without rales, wheezing or rhonchi  ABDOMEN: Soft, non-tender, non-distended MUSCULOSKELETAL:  No edema; No deformity  SKIN: Warm and dry NEUROLOGIC:  Alert and oriented x 3  PSYCHIATRIC:  Normal affect    Signed, Shirlee More, MD  11/19/2018 12:08 PM    [place]

## 2018-11-19 ENCOUNTER — Encounter: Payer: Self-pay | Admitting: Cardiology

## 2018-11-19 ENCOUNTER — Ambulatory Visit (INDEPENDENT_AMBULATORY_CARE_PROVIDER_SITE_OTHER): Payer: Medicare Other | Admitting: Cardiology

## 2018-11-19 ENCOUNTER — Other Ambulatory Visit: Payer: Self-pay

## 2018-11-19 VITALS — BP 126/80 | HR 74 | Ht 60.0 in | Wt 153.0 lb

## 2018-11-19 DIAGNOSIS — I447 Left bundle-branch block, unspecified: Secondary | ICD-10-CM | POA: Diagnosis not present

## 2018-11-19 DIAGNOSIS — I119 Hypertensive heart disease without heart failure: Secondary | ICD-10-CM

## 2018-11-19 DIAGNOSIS — I493 Ventricular premature depolarization: Secondary | ICD-10-CM | POA: Diagnosis not present

## 2018-11-19 DIAGNOSIS — I340 Nonrheumatic mitral (valve) insufficiency: Secondary | ICD-10-CM

## 2018-11-19 DIAGNOSIS — I422 Other hypertrophic cardiomyopathy: Secondary | ICD-10-CM | POA: Diagnosis not present

## 2018-11-19 NOTE — Addendum Note (Signed)
Addended by: Loel Dubonnet on: 11/19/2018 12:25 PM   Modules accepted: Orders

## 2018-11-19 NOTE — Patient Instructions (Signed)
Medication Instructions:  No medication changes today.  If you need a refill on your cardiac medications before your next appointment, please call your pharmacy.   Lab work: No lab work today.  If you have labs (blood work) drawn today and your tests are completely normal, you will receive your results only by: Marland Kitchen MyChart Message (if you have MyChart) OR . A paper copy in the mail If you have any lab test that is abnormal or we need to change your treatment, we will call you to review the results.  Testing/Procedures: Your physician has requested that you have an echocardiogram. Echocardiography is a painless test that uses sound waves to create images of your heart. It provides your doctor with information about the size and shape of your heart and how well your heart's chambers and valves are working. This procedure takes approximately one hour. There are no restrictions for this procedure.  Follow-Up: At [facility], you and your health needs are our priority.  As part of our continuing mission to provide you with exceptional heart care, we have created designated Provider Care Teams.  These Care Teams include your primary Cardiologist (physician) and Advanced Practice Providers (APPs -  Physician Assistants and Nurse Practitioners) who all work together to provide you with the care you need, when you need it. You will need a follow up appointment in 1 years.  Please call our office 2 months in advance to schedule this appointment.  You may see Shirlee More, MD or another member of our [place] [facility] Provider Team in [place]: Jenne Campus, MD . Jyl Heinz, MD . Berniece Salines, MD . Laurann Montana, NP  Any Other Special Instructions Will Be Listed Below (If Applicable).  Echocardiogram An echocardiogram is a procedure that uses painless sound waves (ultrasound) to produce an image of the heart. Images from an echocardiogram can provide important information about:  Signs of  coronary artery disease (CAD).  Aneurysm detection. An aneurysm is a weak or damaged part of an artery wall that bulges out from the normal force of blood pumping through the body.  Heart size and shape. Changes in the size or shape of the heart can be associated with certain conditions, including heart failure, aneurysm, and CAD.  Heart muscle function.  Heart valve function.  Signs of a past heart attack.  Fluid buildup around the heart.  Thickening of the heart muscle.  A tumor or infectious growth around the heart valves. Tell a health care provider about:  Any allergies you have.  All medicines you are taking, including vitamins, herbs, eye drops, creams, and over-the-counter medicines.  Any blood disorders you have.  Any surgeries you have had.  Any medical conditions you have.  Whether you are pregnant or may be pregnant. What are the risks? Generally, this is a safe procedure. However, problems may occur, including:  Allergic reaction to dye (contrast) that may be used during the procedure. What happens before the procedure? No specific preparation is needed. You may eat and drink normally. What happens during the procedure?   An IV tube may be inserted into one of your veins.  You may receive contrast through this tube. A contrast is an injection that improves the quality of the pictures from your heart.  A gel will be applied to your chest.  A wand-like tool (transducer) will be moved over your chest. The gel will help to transmit the sound waves from the transducer.  The sound waves will harmlessly  bounce off of your heart to allow the heart images to be captured in real-time motion. The images will be recorded on a computer. The procedure may vary among health care providers and hospitals. What happens after the procedure?  You may return to your normal, everyday life, including diet, activities, and medicines, unless your health care provider tells you  not to do that. Summary  An echocardiogram is a procedure that uses painless sound waves (ultrasound) to produce an image of the heart.  Images from an echocardiogram can provide important information about the size and shape of your heart, heart muscle function, heart valve function, and fluid buildup around your heart.  You do not need to do anything to prepare before this procedure. You may eat and drink normally.  After the echocardiogram is completed, you may return to your normal, everyday life, unless your health care provider tells you not to do that. This information is not intended to replace advice given to you by your health care provider. Make sure you discuss any questions you have with your health care provider. Document Released: 03/01/2000 Document Revised: 06/25/2018 Document Reviewed: 04/06/2016 Elsevier Patient Education  2020 [employer].

## 2018-11-19 NOTE — Addendum Note (Signed)
Addended by: Austin Miles on: 11/19/2018 12:17 PM   Modules accepted: Orders

## 2018-11-25 ENCOUNTER — Ambulatory Visit (HOSPITAL_BASED_OUTPATIENT_CLINIC_OR_DEPARTMENT_OTHER)
Admission: RE | Admit: 2018-11-25 | Discharge: 2018-11-25 | Disposition: A | Discharge status: Home / Self Care | Payer: Medicare Other | Source: Ambulatory Visit | Attending: Cardiology | Admitting: Cardiology

## 2018-11-25 ENCOUNTER — Other Ambulatory Visit: Payer: Self-pay

## 2018-11-25 DIAGNOSIS — I340 Nonrheumatic mitral (valve) insufficiency: Secondary | ICD-10-CM

## 2018-11-25 DIAGNOSIS — I422 Other hypertrophic cardiomyopathy: Secondary | ICD-10-CM | POA: Insufficient documentation

## 2018-11-25 DIAGNOSIS — I119 Hypertensive heart disease without heart failure: Secondary | ICD-10-CM | POA: Diagnosis present

## 2018-11-25 NOTE — Progress Notes (Signed)
  Echocardiogram 2D Echocardiogram has been performed.  Laurie Bowman 11/25/2018, 11:54 AM

## 2019-01-04 ENCOUNTER — Ambulatory Visit (INDEPENDENT_AMBULATORY_CARE_PROVIDER_SITE_OTHER): Payer: Medicare Other | Admitting: Allergy and Immunology

## 2019-01-04 ENCOUNTER — Other Ambulatory Visit: Payer: Self-pay

## 2019-01-04 ENCOUNTER — Encounter: Payer: Self-pay | Admitting: Allergy and Immunology

## 2019-01-04 VITALS — BP 132/84 | HR 95 | Temp 97.2°F | Resp 18 | Ht 60.0 in | Wt 152.6 lb

## 2019-01-04 DIAGNOSIS — J4540 Moderate persistent asthma, uncomplicated: Secondary | ICD-10-CM | POA: Diagnosis not present

## 2019-01-04 DIAGNOSIS — J3089 Other allergic rhinitis: Secondary | ICD-10-CM

## 2019-01-04 DIAGNOSIS — K219 Gastro-esophageal reflux disease without esophagitis: Secondary | ICD-10-CM | POA: Diagnosis not present

## 2019-01-04 MED ORDER — OLOPATADINE HCL 0.1 % OP SOLN: 1.0000 [drp] | Freq: Two times a day (BID) | OPHTHALMIC | 5 refills | Status: DC

## 2019-01-04 MED ORDER — MONTELUKAST SODIUM 10 MG PO TABS: ORAL_TABLET | ORAL | 5 refills | Status: DC

## 2019-01-04 MED ORDER — FLUTICASONE PROPIONATE HFA 110 MCG/ACT IN AERO: 2.0000 | INHALATION_SPRAY | Freq: Two times a day (BID) | RESPIRATORY_TRACT | 5 refills | Status: DC

## 2019-01-04 MED ORDER — ALBUTEROL SULFATE HFA 108 (90 BASE) MCG/ACT IN AERS: 2.0000 | INHALATION_SPRAY | RESPIRATORY_TRACT | 5 refills | Status: DC | PRN

## 2019-01-04 MED ORDER — MOMETASONE FUROATE 50 MCG/ACT NA SUSP: 2.0000 | Freq: Every day | NASAL | 5 refills | Status: DC | PRN

## 2019-01-04 NOTE — Progress Notes (Signed)
Follow-up Note  RE: Laurie Bowman MRN: [id number] DOB: [birth year] Date of Office Visit: 01/04/2019  Primary care provider: Derrill Center., MD Referring provider: Derrill Center., MD  History of present illness: Laurie Bowman is a 78 y.o. female with persistent asthma, allergic rhinitis, and esophageal reflux presenting today for follow-up.  She was last seen in this clinic on September 14, 2018.  She reports that overall her asthma has been stable, however noting that September and October typically her "worst months."  Recently, she has required albuterol rescue 1 time per week on average and does not experience limitations in normal daily activities.  She is currently taking Flovent 110 g, 2 inhalations via spacer device twice daily, and montelukast 10 mg daily at bedtime.  Her nasal allergy symptoms have been well controlled with Nasonex nasal spray daily.  She did note some blood streaking in her nasal mucus approximately 1 week ago, however she denies recurrent or flowing epistaxis.  She does note some thick postnasal drainage at nighttime.  Assessment and plan: Moderate persistent asthma Stable.  For now, continue Flovent 110 g, 2 inhalations twice a day, montelukast 10 mg daily bedtime, and albuterol HFA, 1-2 inhalations every 4-6 hours as needed and 15 minutes prior to exercise.   During respiratory tract infections or asthma flares, increase Flovent 110 g to 3 inhalations 3 times per day until symptoms have returned to baseline.  To maximize pulmonary deposition, a spacer has been provided along with instructions for its proper administration with an HFA inhaler.  Subjective and objective measures of pulmonary function will be followed and the treatment plan will be adjusted accordingly.  Allergic rhinitis  Continue appropriate allergen avoidance measures and Nasonex, 2 sprays per nostril daily as needed.  If blood streaking of the mucus persists or progresses, let us know and we  will switch to a nonsteroidal nasal spray.  Nasal saline spray (i.e., Simply Saline) or nasal saline lavage (i.e., NeilMed) is recommended as needed and prior to medicated nasal sprays.  GERD (gastroesophageal reflux disease)  Continue appropriate reflux lifestyle modifications and lansoprazole (Prevacid) as prescribed.   Meds ordered this encounter  Medications   fluticasone (FLOVENT HFA) 110 MCG/ACT inhaler    Sig: Inhale 2 puffs into the lungs 2 (two) times daily.    Dispense:  1 Inhaler    Refill:  5   montelukast (SINGULAIR) 10 MG tablet    Sig: TAKE 1 TABLET BY MOUTH EVERYDAY AT BEDTIME    Dispense:  30 tablet    Refill:  5   albuterol (PROAIR HFA) 108 (90 Base) MCG/ACT inhaler    Sig: Inhale 2 puffs into the lungs every 4 (four) hours as needed for wheezing or shortness of breath.    Dispense:  18 g    Refill:  5    May use every 4-6  Hours as needed.   mometasone (NASONEX) 50 MCG/ACT nasal spray    Sig: Place 2 sprays into the nose daily as needed.    Dispense:  17 g    Refill:  5    pts apt 09/14/18   olopatadine (PATANOL) 0.1 % ophthalmic solution    Sig: Place 1 drop into both eyes 2 (two) times daily.    Dispense:  5 mL    Refill:  5    Diagnostics: Spirometry reveals an FVC of 1.82 L and an FEV1 of 1.53 L (91% predicted).  Please see scanned spirometry results for details.  Physical examination: Blood pressure 132/84, pulse 95, temperature (!) 97.2 F (36.2 C), temperature source Temporal, resp. rate 18, height 5' (1.524 m), weight 152 lb 9.6 oz (69.2 kg), SpO2 96 %.  General: Alert, interactive, in no acute distress. HEENT: TMs pearly gray, turbinates mildly edematous without discharge, post-pharynx unremarkable. Neck: Supple without lymphadenopathy. Lungs: Clear to auscultation without wheezing, rhonchi or rales. CV: Normal S1, S2 without murmurs. Skin: Warm and dry, without lesions or rashes.  The following portions of the patient's history  were reviewed and updated as appropriate: allergies, current medications, past family history, past medical history, past social history, past surgical history and problem list.   Current Outpatient Medications  Medication Sig Dispense Refill   albuterol (PROAIR HFA) 108 (90 Base) MCG/ACT inhaler Inhale 2 puffs into the lungs every 4 (four) hours as needed for wheezing or shortness of breath. 18 g 5   albuterol (PROVENTIL) (2.5 MG/3ML) 0.083% nebulizer solution Take 3 mLs (2.5 mg total) by nebulization every 6 (six) hours as needed for wheezing or shortness of breath. 150 mL 1   aspirin 81 MG chewable tablet Chew 81 mg by mouth daily.      augmented betamethasone dipropionate (DIPROLENE-AF) 0.05 % cream Apply [id number] application topically daily.     benzonatate (TESSALON) 100 MG capsule Take 100 mg by mouth daily as needed.      Biotin 1000 MCG tablet Take 1,000 mcg by mouth daily.      buPROPion (WELLBUTRIN SR) 150 MG 12 hr tablet Take 150 mg by mouth daily.      calcium carbonate (OS-CAL) 600 MG TABS tablet Take 1 tablet by mouth 2 (two) times daily.      Cholecalciferol (VITAMIN D-1000 MAX ST) 1000 units tablet Take 1,000 Units by mouth daily.      clobetasol cream (TEMOVATE) 0.05 %      clotrimazole-betamethasone (LOTRISONE) cream APPLY TO AFFECTED AREA IN THE MORNING & THEN BEDTIME FOR 2 WEEKS  1   diclofenac sodium (VOLTAREN) 1 % GEL APPLY 4 G TOPICALLY 4 TIMES DAILY FOR 30 DAYS.     dicyclomine (BENTYL) 20 MG tablet TAKE 1 TABLET (20 MG TOTAL) BY MOUTH EVERY 6 (SIX) HOURS AS NEEDED.     fexofenadine (ALLEGRA) 180 MG tablet Take 180 mg by mouth daily.      fluocinonide cream (LIDEX) [id number] % Apply 1 application topically 2 (two) times daily.      fluticasone (FLOVENT HFA) 110 MCG/ACT inhaler Inhale 2 puffs into the lungs 2 (two) times daily. 1 Inhaler 5   hydrochlorothiazide (HYDRODIURIL) 25 MG tablet Take 25 mg by mouth daily.  2   lansoprazole (PREVACID) 30 MG capsule Take  30 mg by mouth 2 (two) times daily before a meal.      mometasone (NASONEX) 50 MCG/ACT nasal spray Place 2 sprays into the nose daily as needed. 17 g 5   montelukast (SINGULAIR) 10 MG tablet TAKE 1 TABLET BY MOUTH EVERYDAY AT BEDTIME 30 tablet 5   olopatadine (PATANOL) 0.1 % ophthalmic solution Place 1 drop into both eyes 2 (two) times daily. 5 mL 5   Polyethylene Glycol 3350 (PEG 3350) POWD Take 17 g by mouth daily.      potassium chloride (KLOR-CON M10) 10 MEQ tablet 10 mEq 2 (two) times daily.      rosuvastatin (CRESTOR) 5 MG tablet Take 5 mg by mouth daily.      triamcinolone cream (KENALOG) 0.1 % APPLY A THIN FILM TO THE AFFECTED SKIN  AREAS BY TOPICAL ROUTE 2 TIMESPER DAY     verapamil (CALAN-SR) 180 MG CR tablet Take 1 tablet (180 mg total) by mouth daily. 90 tablet 1   Current Facility-Administered Medications  Medication Dose Route Frequency Provider Last Rate Last Dose   predniSONE (DELTASONE) tablet 10 mg  10 mg Oral Q breakfast Azka Steger, [place] Muta, MD        Allergies  Allergen Reactions   Cefaclor Itching    Other reaction(s): Flushing (ALLERGY/intolerance)   Codeine Itching and Nausea And Vomiting   Fructose Swelling    Swelling and GI upset Other reaction(s): GI Upset (intolerance) Fructose Granules    Sulfa Antibiotics Swelling   Meperidine Nausea And Vomiting and Itching   Sulfur Swelling   Other Nausea And Vomiting    Review of systems: Review of systems negative except as noted in HPI / PMHx or noted below: Constitutional: Negative.  HENT: Negative.   Eyes: Negative.  Respiratory: Negative.   Cardiovascular: Negative.  Gastrointestinal: Negative.  Genitourinary: Negative.  Musculoskeletal: Negative.  Neurological: Negative.  Endo/Heme/Allergies: Negative.  Cutaneous: Negative.   Past Medical History:  Diagnosis Date   Asthma    Cancer ([place])    Hypertension    Urticaria     Family History  Problem Relation Age of Onset    Congestive Heart Failure Mother    Hypertension Mother    Hyperlipidemia Sister    Dementia Sister    Alzheimer's disease Sister    Stroke Maternal Grandmother    Breast cancer Sister    Hyperlipidemia Sister    Allergic rhinitis Neg Hx    Angioedema Neg Hx    Asthma Neg Hx    Eczema Neg Hx    Immunodeficiency Neg Hx    Urticaria Neg Hx    Atopy Neg Hx     Social History   Socioeconomic History   Marital status: Married    Spouse name: Not on file   Number of children: Not on file   Years of education: Not on file   Highest education level: Not on file  Occupational History   Not on file  Social Needs   Financial resource strain: Not on file   Food insecurity    Worry: Not on file    Inability: Not on file   Transportation needs    Medical: Not on file    Non-medical: Not on file  Tobacco Use   Smoking status: Former Smoker    Types: Cigarettes   Smokeless tobacco: Never Used  Substance and Sexual Activity   Alcohol use: No    Alcohol/week: 0.0 standard drinks   Drug use: No   Sexual activity: Never  Lifestyle   Physical activity    Days per week: Not on file    Minutes per session: Not on file   Stress: Not on file  Relationships   Social connections    Talks on phone: Not on file    Gets together: Not on file    Attends religious service: Not on file    Active member of club or organization: Not on file    Attends meetings of clubs or organizations: Not on file    Relationship status: Not on file   Intimate partner violence    Fear of current or ex partner: Not on file    Emotionally abused: Not on file    Physically abused: Not on file    Forced sexual activity: Not on file  Other Topics Concern  Not on file  Social History Narrative   Not on file    I appreciate the opportunity to take part in [place] care. Please do not hesitate to contact me with questions.  Sincerely,   R. Edgar Frisk, MD

## 2019-01-04 NOTE — Assessment & Plan Note (Signed)
Stable.  For now, continue Flovent 110 g, 2 inhalations twice a day, montelukast 10 mg daily bedtime, and albuterol HFA, 1-2 inhalations every 4-6 hours as needed and 15 minutes prior to exercise.   During respiratory tract infections or asthma flares, increase Flovent 110 g to 3 inhalations 3 times per day until symptoms have returned to baseline.  To maximize pulmonary deposition, a spacer has been provided along with instructions for its proper administration with an HFA inhaler.  Subjective and objective measures of pulmonary function will be followed and the treatment plan will be adjusted accordingly.

## 2019-01-04 NOTE — Assessment & Plan Note (Signed)
   Continue appropriate allergen avoidance measures and Nasonex, 2 sprays per nostril daily as needed.  If blood streaking of the mucus persists or progresses, let us know and we will switch to a nonsteroidal nasal spray.  Nasal saline spray (i.e., Simply Saline) or nasal saline lavage (i.e., NeilMed) is recommended as needed and prior to medicated nasal sprays.

## 2019-01-04 NOTE — Assessment & Plan Note (Signed)
   Continue appropriate reflux lifestyle modifications and lansoprazole (Prevacid) as prescribed.

## 2019-01-04 NOTE — Patient Instructions (Addendum)
Moderate persistent asthma Stable.  For now, continue Flovent 110 g, 2 inhalations twice a day, montelukast 10 mg daily bedtime, and albuterol HFA, 1-2 inhalations every 4-6 hours as needed and 15 minutes prior to exercise.   During respiratory tract infections or asthma flares, increase Flovent 110 g to 3 inhalations 3 times per day until symptoms have returned to baseline.  To maximize pulmonary deposition, a spacer has been provided along with instructions for its proper administration with an HFA inhaler.  Subjective and objective measures of pulmonary function will be followed and the treatment plan will be adjusted accordingly.  Allergic rhinitis  Continue appropriate allergen avoidance measures and Nasonex, 2 sprays per nostril daily as needed.  If blood streaking of the mucus persists or progresses, let us know and we will switch to a nonsteroidal nasal spray.  Nasal saline spray (i.e., Simply Saline) or nasal saline lavage (i.e., NeilMed) is recommended as needed and prior to medicated nasal sprays.  GERD (gastroesophageal reflux disease)  Continue appropriate reflux lifestyle modifications and lansoprazole (Prevacid) as prescribed.   Return in about 4 months (around 05/07/2019), or if symptoms worsen or fail to improve.

## 2019-04-02 ENCOUNTER — Ambulatory Visit: Payer: Medicare Other | Attending: Internal Medicine

## 2019-04-02 DIAGNOSIS — Z23 Encounter for immunization: Secondary | ICD-10-CM | POA: Insufficient documentation

## 2019-04-02 NOTE — Progress Notes (Signed)
   Covid-19 Vaccination Clinic  Name:  Laurie Bowman    MRN: [id number] DOB: 10-24-1940  04/02/2019  [name] was observed post Covid-19 immunization for 30 minutes based on pre-vaccination screening without incidence. She was provided with Vaccine Information Sheet and instruction to access the V-Safe system.   [name] was instructed to call 911 with any severe reactions post vaccine: Marland Kitchen Difficulty breathing  . Swelling of your face and throat  . A fast heartbeat  . A bad rash all over your body  . Dizziness and weakness    Immunizations Administered    Name Date Dose VIS Date Route   Pfizer COVID-19 Vaccine 04/02/2019  9:09 AM 0.3 mL 02/26/2019 Intramuscular   Manufacturer: [place]   Lot: [id number]   [place]: [id number]

## 2019-04-21 ENCOUNTER — Ambulatory Visit: Payer: Medicare Other | Attending: Internal Medicine

## 2019-04-21 DIAGNOSIS — Z23 Encounter for immunization: Secondary | ICD-10-CM | POA: Insufficient documentation

## 2019-04-21 NOTE — Progress Notes (Signed)
   Covid-19 Vaccination Clinic  Name:  Laurie Bowman    MRN: [id number] DOB: 1940/10/17  04/21/2019  [name] was observed post Covid-19 immunization for 15 minutes without incidence. She was provided with Vaccine Information Sheet and instruction to access the V-Safe system.   [name] was instructed to call 911 with any severe reactions post vaccine: Marland Kitchen Difficulty breathing  . Swelling of your face and throat  . A fast heartbeat  . A bad rash all over your body  . Dizziness and weakness    Immunizations Administered    Name Date Dose VIS Date Route   Pfizer COVID-19 Vaccine 04/21/2019 10:39 AM 0.3 mL 02/26/2019 Intramuscular   Manufacturer: [place]   Lot: [id number]   [place]: [id number]

## 2019-04-24 ENCOUNTER — Other Ambulatory Visit: Payer: Self-pay | Admitting: Cardiology

## 2019-05-09 DIAGNOSIS — N182 Chronic kidney disease, stage 2 (mild): Secondary | ICD-10-CM

## 2019-05-09 HISTORY — DX: Chronic kidney disease, stage 2 (mild): N18.2

## 2019-05-10 ENCOUNTER — Encounter: Payer: Self-pay | Admitting: Allergy and Immunology

## 2019-05-10 ENCOUNTER — Other Ambulatory Visit: Payer: Self-pay

## 2019-05-10 ENCOUNTER — Ambulatory Visit: Payer: Medicare Other | Admitting: Allergy and Immunology

## 2019-05-10 VITALS — BP 138/72 | HR 82 | Temp 97.2°F | Resp 20 | Ht 60.0 in | Wt 150.0 lb

## 2019-05-10 DIAGNOSIS — J4540 Moderate persistent asthma, uncomplicated: Secondary | ICD-10-CM

## 2019-05-10 DIAGNOSIS — K219 Gastro-esophageal reflux disease without esophagitis: Secondary | ICD-10-CM

## 2019-05-10 DIAGNOSIS — J3089 Other allergic rhinitis: Secondary | ICD-10-CM

## 2019-05-10 MED ORDER — MOMETASONE FUROATE 50 MCG/ACT NA SUSP: 2.0000 | Freq: Every day | NASAL | 5 refills | Status: DC | PRN

## 2019-05-10 MED ORDER — ALBUTEROL SULFATE HFA 108 (90 BASE) MCG/ACT IN AERS: INHALATION_SPRAY | RESPIRATORY_TRACT | 1 refills | Status: DC

## 2019-05-10 MED ORDER — MONTELUKAST SODIUM 10 MG PO TABS: ORAL_TABLET | ORAL | 5 refills | Status: DC

## 2019-05-10 MED ORDER — FLUTICASONE PROPIONATE HFA 110 MCG/ACT IN AERO: 2.0000 | INHALATION_SPRAY | Freq: Two times a day (BID) | RESPIRATORY_TRACT | 5 refills | Status: DC

## 2019-05-10 NOTE — Assessment & Plan Note (Signed)
   Continue appropriate allergen avoidance measures and Nasonex, 2 sprays per nostril daily as needed.  Nasal saline spray (i.e., Simply Saline) or nasal saline lavage (i.e., NeilMed) is recommended as needed and prior to medicated nasal sprays.  For thick post nasal drainage, add guaifenesin 600-1200 mg (Mucinex)  twice daily as needed with adequate hydration as discussed. 

## 2019-05-10 NOTE — Patient Instructions (Addendum)
Moderate persistent asthma  For now, continue Flovent 110 g, 2 inhalations twice a day, montelukast 10 mg daily bedtime, and albuterol HFA, 1-2 inhalations every 4-6 hours as needed and 15 minutes prior to exercise.   During respiratory tract infections or asthma flares, increase Flovent 110 g to 3 inhalations 3 times per day until symptoms have returned to baseline.  To maximize pulmonary deposition, a spacer has been provided along with instructions for its proper administration with an HFA inhaler.  Subjective and objective measures of pulmonary function will be followed and the treatment plan will be adjusted accordingly.  Allergic rhinitis  Continue appropriate allergen avoidance measures and Nasonex, 2 sprays per nostril daily as needed.  Nasal saline spray (i.e., Simply Saline) or nasal saline lavage (i.e., NeilMed) is recommended as needed and prior to medicated nasal sprays.  For thick post nasal drainage, add guaifenesin [phone] mg (Mucinex)  twice daily as needed with adequate hydration as discussed.  GERD (gastroesophageal reflux disease)  Continue appropriate reflux lifestyle modifications and lansoprazole as prescribed.   Return in about 5 months (around 10/07/2019), or if symptoms worsen or fail to improve.

## 2019-05-10 NOTE — Assessment & Plan Note (Addendum)
   For now, continue Flovent 110 g, 2 inhalations twice a day, montelukast 10 mg daily bedtime, and albuterol HFA, 1-2 inhalations every 4-6 hours as needed and 15 minutes prior to exercise.   During respiratory tract infections or asthma flares, increase Flovent 110 g to 3 inhalations 3 times per day until symptoms have returned to baseline.  To maximize pulmonary deposition, a spacer has been provided along with instructions for its proper administration with an HFA inhaler.  Subjective and objective measures of pulmonary function will be followed and the treatment plan will be adjusted accordingly.

## 2019-05-10 NOTE — Assessment & Plan Note (Addendum)
   Continue appropriate reflux lifestyle modifications and lansoprazole as prescribed.

## 2019-05-10 NOTE — Progress Notes (Signed)
Follow-up Note  RE: Laurie Bowman MRN: [id number] DOB: [birth year] Date of Office Visit: 05/10/2019  Primary care provider: Derrill Center., MD Referring provider: Derrill Center., MD  History of present illness: Laurie Bowman is a 79 y.o. female with persistent asthma, allergic rhinitis, and esophageal reflux presenting today for follow-up.  She was last seen in this clinic in October 2020.  She reports that in the interval since her previous visit her asthma has been well controlled.  She rarely requires albuterol rescue, typically with cold air exposure, and rarely experiences limitations in normal daily activities.  She does not experience nocturnal awakenings due to lower respiratory symptoms.  She reports that she is still taking Flovent 110 g, 2 inhalations twice daily, however admits that she is not consistent with a spacer device.  In addition, she is taking montelukast 10 mg daily at bedtime.  She can tell if she misses even a single dose of montelukast based upon increased symptoms.  She reports that approximately 2 weeks ago she experienced maxillary sinus pressure and some discolored mucus production.  She was seen by her primary care physician who did not believe that she had a sinus infection at the time.  In the interval since the visit with her primary care physician, the sinus pressure has resolved and, while she still has some thick postnasal drainage, it is clear at this time.  She has not been experiencing fevers or chills.  Assessment and plan: Moderate persistent asthma  For now, continue Flovent 110 g, 2 inhalations twice a day, montelukast 10 mg daily bedtime, and albuterol HFA, 1-2 inhalations every 4-6 hours as needed and 15 minutes prior to exercise.   During respiratory tract infections or asthma flares, increase Flovent 110 g to 3 inhalations 3 times per day until symptoms have returned to baseline.  To maximize pulmonary deposition, a spacer has been provided  along with instructions for its proper administration with an HFA inhaler.  Subjective and objective measures of pulmonary function will be followed and the treatment plan will be adjusted accordingly.  Allergic rhinitis  Continue appropriate allergen avoidance measures and Nasonex, 2 sprays per nostril daily as needed.  Nasal saline spray (i.e., Simply Saline) or nasal saline lavage (i.e., NeilMed) is recommended as needed and prior to medicated nasal sprays.  For thick post nasal drainage, add guaifenesin [phone] mg (Mucinex)  twice daily as needed with adequate hydration as discussed.  GERD (gastroesophageal reflux disease)  Continue appropriate reflux lifestyle modifications and lansoprazole as prescribed.   Meds ordered this encounter  Medications  . fluticasone (FLOVENT HFA) 110 MCG/ACT inhaler    Sig: Inhale 2 puffs into the lungs 2 (two) times daily. Use with spacer.    Dispense:  1 Inhaler    Refill:  5  . montelukast (SINGULAIR) 10 MG tablet    Sig: TAKE 1 TABLET BY MOUTH EVERYDAY AT BEDTIME    Dispense:  30 tablet    Refill:  5  . mometasone (NASONEX) 50 MCG/ACT nasal spray    Sig: Place 2 sprays into the nose daily as needed.    Dispense:  17 g    Refill:  5    pts apt 09/14/18  . albuterol (PROAIR HFA) 108 (90 Base) MCG/ACT inhaler    Sig: Use 1-2 puffs every 4-6 hours as needed for cough or wheeze.    Dispense:  18 g    Refill:  1    May use every 4-6  Hours  as needed.    Diagnostics: Spirometry:  Normal with an FEV1 of 83% predicted. This study was performed while the patient was asymptomatic.  Please see scanned spirometry results for details.    Physical examination: Blood pressure 138/72, pulse 82, temperature (!) 97.2 F (36.2 C), temperature source Temporal, resp. rate 20, height 5' (1.524 m), weight 150 lb (68 kg), SpO2 98 %.  General: Alert, interactive, in no acute distress. HEENT: TMs pearly gray, turbinates mildly edematous without discharge,  post-pharynx mildly erythematous. Neck: Supple without lymphadenopathy. Lungs: Clear to auscultation without wheezing, rhonchi or rales. CV: Normal S1, S2 without murmurs. Skin: Warm and dry, without lesions or rashes.  The following portions of the patient's history were reviewed and updated as appropriate: allergies, current medications, past family history, past medical history, past social history, past surgical history and problem list.  Current Outpatient Medications  Medication Sig Dispense Refill  . albuterol (PROAIR HFA) 108 (90 Base) MCG/ACT inhaler Use 1-2 puffs every 4-6 hours as needed for cough or wheeze. 18 g 1  . albuterol (PROVENTIL) (2.5 MG/3ML) 0.083% nebulizer solution Take 3 mLs (2.5 mg total) by nebulization every 6 (six) hours as needed for wheezing or shortness of breath. 150 mL 1  . aspirin 81 MG chewable tablet Chew 81 mg by mouth daily.     Marland Kitchen augmented betamethasone dipropionate (DIPROLENE-AF) 0.05 % cream Apply [id number] application topically daily.    . benzonatate (TESSALON) 100 MG capsule Take 100 mg by mouth daily as needed.     . Biotin 1000 MCG tablet Take 1,000 mcg by mouth daily.     Marland Kitchen buPROPion (WELLBUTRIN SR) 150 MG 12 hr tablet Take 150 mg by mouth daily.     . calcium carbonate (OS-CAL) 600 MG TABS tablet Take 1 tablet by mouth 2 (two) times daily.     . calcium-vitamin D (OSCAL WITH D) 500-200 MG-UNIT TABS tablet Take by mouth.    . Cholecalciferol (VITAMIN D-1000 MAX ST) 1000 units tablet Take 1,000 Units by mouth daily.     . clobetasol cream (TEMOVATE) 0.05 %     . diclofenac sodium (VOLTAREN) 1 % GEL APPLY 4 G TOPICALLY 4 TIMES DAILY FOR 30 DAYS.    Marland Kitchen dicyclomine (BENTYL) 20 MG tablet TAKE 1 TABLET (20 MG TOTAL) BY MOUTH EVERY 6 (SIX) HOURS AS NEEDED.    . fexofenadine (ALLEGRA) 180 MG tablet Take 180 mg by mouth daily.     . fluocinonide cream (LIDEX) [id number] % Apply 1 application topically 2 (two) times daily.     . fluticasone (FLOVENT HFA) 110 MCG/ACT  inhaler Inhale 2 puffs into the lungs 2 (two) times daily. Use with spacer. 1 Inhaler 5  . hydrochlorothiazide (HYDRODIURIL) 25 MG tablet Take 25 mg by mouth daily.  2  . lansoprazole (PREVACID) 30 MG capsule Take 30 mg by mouth 2 (two) times daily before a meal.     . lansoprazole (PREVACID) 30 MG capsule Take by mouth.    . mometasone (NASONEX) 50 MCG/ACT nasal spray Place 2 sprays into the nose daily as needed. 17 g 5  . montelukast (SINGULAIR) 10 MG tablet TAKE 1 TABLET BY MOUTH EVERYDAY AT BEDTIME 30 tablet 5  . olopatadine (PATANOL) 0.1 % ophthalmic solution Place 1 drop into both eyes 2 (two) times daily. 5 mL 5  . Polyethylene Glycol 3350 (PEG 3350) POWD Take 17 g by mouth daily.     . potassium chloride (KLOR-CON M10) 10 MEQ tablet 10 mEq  2 (two) times daily.     . rosuvastatin (CRESTOR) 5 MG tablet Take 5 mg by mouth daily.     Marland Kitchen triamcinolone cream (KENALOG) 0.1 % APPLY A THIN FILM TO THE AFFECTED SKIN AREAS BY TOPICAL ROUTE 2 TIMESPER DAY    . verapamil (CALAN-SR) 180 MG CR tablet TAKE 1 TABLET BY MOUTH EVERY DAY 90 tablet 1  . clotrimazole-betamethasone (LOTRISONE) cream APPLY TO AFFECTED AREA IN THE MORNING & THEN BEDTIME FOR 2 WEEKS  1  . clotrimazole-betamethasone (LOTRISONE) cream Apply BID    . diclofenac Sodium (VOLTAREN) 1 % GEL      Current Facility-Administered Medications  Medication Dose Route Frequency Provider Last Rate Last Admin  . predniSONE (DELTASONE) tablet 10 mg  10 mg Oral Q breakfast Amelita Risinger, [place] Muta, MD        Allergies  Allergen Reactions  . Cefaclor Itching    Other reaction(s): Flushing (ALLERGY/intolerance)  . Codeine Itching and Nausea And Vomiting  . Fructose Swelling    Swelling and GI upset Other reaction(s): GI Upset (intolerance) Fructose Granules   . Sulfa Antibiotics Swelling  . Meperidine Nausea And Vomiting and Itching  . Sulfur Swelling  . Other Nausea And Vomiting    Review of systems: Review of systems negative except as  noted in HPI / PMHx.   Past Medical History:  Diagnosis Date  . Asthma   . Cancer ([place])   . Hypertension   . Urticaria     Family History  Problem Relation Age of Onset  . Congestive Heart Failure Mother   . Hypertension Mother   . Hyperlipidemia Sister   . Dementia Sister   . Alzheimer's disease Sister   . Stroke Maternal Grandmother   . Breast cancer Sister   . Hyperlipidemia Sister   . Allergic rhinitis Neg Hx   . Angioedema Neg Hx   . Asthma Neg Hx   . Eczema Neg Hx   . Immunodeficiency Neg Hx   . Urticaria Neg Hx   . Atopy Neg Hx     Social History   Socioeconomic History  . Marital status: Married    Spouse name: Not on file  . Number of children: Not on file  . Years of education: Not on file  . Highest education level: Not on file  Occupational History  . Not on file  Tobacco Use  . Smoking status: Former Smoker    Types: Cigarettes  . Smokeless tobacco: Never Used  Substance and Sexual Activity  . Alcohol use: No    Alcohol/week: 0.0 standard drinks  . Drug use: No  . Sexual activity: Never  Other Topics Concern  . Not on file  Social History Narrative  . Not on file   Social Determinants of Health   Financial Resource Strain:   . Difficulty of Paying Living Expenses: Not on file  Food Insecurity:   . Worried About Charity fundraiser in the Last Year: Not on file  . Ran Out of Food in the Last Year: Not on file  Transportation Needs:   . Lack of Transportation (Medical): Not on file  . Lack of Transportation (Non-Medical): Not on file  Physical Activity:   . Days of Exercise per Week: Not on file  . Minutes of Exercise per Session: Not on file  Stress:   . Feeling of Stress : Not on file  Social Connections:   . Frequency of Communication with Friends and Family: Not on file  .  Frequency of Social Gatherings with Friends and Family: Not on file  . Attends Religious Services: Not on file  . Active Member of Clubs or Organizations: Not  on file  . Attends Archivist Meetings: Not on file  . Marital Status: Not on file  Intimate Partner Violence:   . Fear of Current or Ex-Partner: Not on file  . Emotionally Abused: Not on file  . Physically Abused: Not on file  . Sexually Abused: Not on file     I appreciate the opportunity to take part in Evan's care. Please do not hesitate to contact me with questions.  Sincerely,   R. Edgar Frisk, MD

## 2019-05-22 NOTE — Progress Notes (Signed)
Cardiology Office Note:    Date:  05/24/2019   ID:  Laurie Bowman, DOB 1940/07/18, MRN [id number]  PCP:  Derrill Center., MD  Cardiologist:  Shirlee More, MD    Referring MD: Derrill Center., MD    ASSESSMENT:    1. Hypertrophic cardiomyopathy ([place])   2. LBBB (left bundle branch block)   3. PVC's (premature ventricular contractions)   4. Hyperlipidemia, unspecified hyperlipidemia type    PLAN:    In order of problems listed above:  1. Improved asymptomatic continue her calcium channel blocker.  She has no high risk markers for sudden cardiac death. 2. Stable EKG pattern 3. Improved asymptomatic no PVCs on her EKG 4. Lipids are ideal continue statin   Next appointment: 1 year   Medication Adjustments/Labs and Tests Ordered: Current medicines are reviewed at length with the patient today.  Concerns regarding medicines are outlined above.  No orders of the defined types were placed in this encounter.  No orders of the defined types were placed in this encounter.   Chief Complaint  Patient presents with  . Follow-up  . Cardiomyopathy    She has obstructive hypertrophic cardiomyopathy    History of Present Illness:    Laurie Bowman is a 79 y.o. 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 Hypertension, left bundle branch block, frequent PVCs and hyperlipidemia  last seen 11/19/2018. Compliance with diet, lifestyle and medications: Yes  Echo 11/25/2018: 1. The left ventricle has normal systolic function with an ejection  fraction of 60-65%. The cavity size was normal. There is moderate  concentric left ventricular hypertrophy and focal hypertrophy of the  basilar septum 56mm.  IVS 1.52 cm, posterior wall 1.58 cm. 2. The right ventricle has normal systolic function. The cavity was  normal. There is no increase in right ventricular wall thickness.  3. The aortic root and ascending aorta are normal  in size and structure.  4. The aortic valve is tricuspid. Mild sclerosis of the aortic valve  without leaflet restriction. No stenosis of the aortic valve.  5. No evidence of mitral valve stenosis.  6. 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  Recent labs show ideal lipids and her blood pressure is mildly elevated I asked her to go back on half of her previous dose of the diuretic.  She shows me an abrasion on her legs and asked me to inspect the wound and looks clean does not appear infected and I advised local wound care using Telfa and paper tape. Past Medical History:  Diagnosis Date  . Asthma   . Cancer ([place])   . Hypertension   . Urticaria     Past Surgical History:  Procedure Laterality Date  . ABDOMINAL ADHESION SURGERY    . ADENOIDECTOMY    . BREAST SURGERY    . carpel tunnel    . CESAREAN SECTION    . KNEE SURGERY    . LEG SURGERY     bone graft  . TONSILLECTOMY    . TONSILLECTOMY    . VEIN SURGERY     Right Leg    Current Medications: Current Meds  Medication Sig  . albuterol (PROAIR HFA) 108 (90 Base) MCG/ACT inhaler Use 1-2 puffs every  4-6 hours as needed for cough or wheeze.  Marland Kitchen albuterol (PROVENTIL) (2.5 MG/3ML) 0.083% nebulizer solution Take 3 mLs (2.5 mg total) by nebulization every 6 (six) hours as needed for wheezing or shortness of breath.  Marland Kitchen aspirin 81 MG chewable tablet Chew 81 mg by mouth daily.   Marland Kitchen augmented betamethasone dipropionate (DIPROLENE-AF) 0.05 % cream Apply [id number] application topically daily.  . benzonatate (TESSALON) 100 MG capsule Take 100 mg by mouth daily as needed.   . Biotin 1000 MCG tablet Take 1,000 mcg by mouth daily.   Marland Kitchen buPROPion (WELLBUTRIN SR) 150 MG 12 hr tablet Take 150 mg by mouth daily.   . calcium carbonate  (OS-CAL) 600 MG TABS tablet Take 1 tablet by mouth 2 (two) times daily.   . calcium-vitamin D (OSCAL WITH D) 500-200 MG-UNIT TABS tablet Take by mouth.  . Cholecalciferol (VITAMIN D-1000 MAX ST) 1000 units tablet Take 1,000 Units by mouth daily.   . clobetasol cream (TEMOVATE) 0.05 %   . clotrimazole-betamethasone (LOTRISONE) cream APPLY TO AFFECTED AREA IN THE MORNING & THEN BEDTIME FOR 2 WEEKS  . clotrimazole-betamethasone (LOTRISONE) cream Apply BID  . diclofenac sodium (VOLTAREN) 1 % GEL APPLY 4 G TOPICALLY 4 TIMES DAILY FOR 30 DAYS.  Marland Kitchen diclofenac Sodium (VOLTAREN) 1 % GEL   . dicyclomine (BENTYL) 20 MG tablet TAKE 1 TABLET (20 MG TOTAL) BY MOUTH EVERY 6 (SIX) HOURS AS NEEDED.  . fexofenadine (ALLEGRA) 180 MG tablet Take 180 mg by mouth daily.   . fluocinonide cream (LIDEX) [id number] % Apply 1 application topically 2 (two) times daily.   . fluticasone (FLOVENT HFA) 110 MCG/ACT inhaler Inhale 2 puffs into the lungs 2 (two) times daily. Use with spacer.  . hydrochlorothiazide (HYDRODIURIL) 25 MG tablet Take 25 mg by mouth daily.  . lansoprazole (PREVACID) 30 MG capsule Take 30 mg by mouth 2 (two) times daily before a meal.   . lansoprazole (PREVACID) 30 MG capsule Take by mouth.  . mometasone (NASONEX) 50 MCG/ACT nasal spray Place 2 sprays into the nose daily as needed.  . montelukast (SINGULAIR) 10 MG tablet TAKE 1 TABLET BY MOUTH EVERYDAY AT BEDTIME  . olopatadine (PATANOL) 0.1 % ophthalmic solution Place 1 drop into both eyes 2 (two) times daily.  . Polyethylene Glycol 3350 (PEG 3350) POWD Take 17 g by mouth daily.   . potassium chloride (KLOR-CON M10) 10 MEQ tablet 10 mEq 2 (two) times daily.   . rosuvastatin (CRESTOR) 5 MG tablet Take 5 mg by mouth daily.   Marland Kitchen triamcinolone cream (KENALOG) 0.1 % APPLY A THIN FILM TO THE AFFECTED SKIN AREAS BY TOPICAL ROUTE 2 TIMESPER DAY  . verapamil (CALAN-SR) 180 MG CR tablet TAKE 1 TABLET BY MOUTH EVERY DAY   Current Facility-Administered Medications for  the 05/24/19 encounter (Office Visit) with Richardo Priest, MD  Medication  . predniSONE (DELTASONE) tablet 10 mg     Allergies:   Cefaclor, Codeine, Fructose, Sulfa antibiotics, Meperidine, Sulfur, and Other   Social History   Socioeconomic History  . Marital status: Married    Spouse name: Not on file  . Number of children: Not on file  . Years of education: Not on file  . Highest education level: Not on file  Occupational History  . Not on file  Tobacco Use  . Smoking status: Former Smoker    Types: Cigarettes  . Smokeless tobacco: Never Used  Substance and Sexual Activity  . Alcohol use: No    Alcohol/week: 0.0 standard  drinks  . Drug use: No  . Sexual activity: Never  Other Topics Concern  . Not on file  Social History Narrative  . Not on file   Social Determinants of Health   Financial Resource Strain:   . Difficulty of Paying Living Expenses: Not on file  Food Insecurity:   . Worried About Charity fundraiser in the Last Year: Not on file  . Ran Out of Food in the Last Year: Not on file  Transportation Needs:   . Lack of Transportation (Medical): Not on file  . Lack of Transportation (Non-Medical): Not on file  Physical Activity:   . Days of Exercise per Week: Not on file  . Minutes of Exercise per Session: Not on file  Stress:   . Feeling of Stress : Not on file  Social Connections:   . Frequency of Communication with Friends and Family: Not on file  . Frequency of Social Gatherings with Friends and Family: Not on file  . Attends Religious Services: Not on file  . Active Member of Clubs or Organizations: Not on file  . Attends Archivist Meetings: Not on file  . Marital Status: Not on file     Family History: The patient's \ family history includes Alzheimer's disease in her sister; Breast cancer in her sister; Congestive Heart Failure in her mother; Dementia in her sister; Hyperlipidemia in her sister and sister; Hypertension in her mother;  Stroke in her maternal grandmother. There is no history of Allergic rhinitis, Angioedema, Asthma, Eczema, Immunodeficiency, Urticaria, or Atopy. ROS:   Please see the history of present illness.    All other systems reviewed and are negative.  EKGs/Labs/Other Studies Reviewed:    The following studies were reviewed today:  EKG:  EKG ordered today and personally reviewed.  The ekg ordered today demonstrates sinus rhythm left axis deviation left bundle branch block no PVCs  Recent Labs: 04/29/2019: LFTs normal CMP potassium 4.5 creatinine 0.93 GFR 59 cc/min Cholesterol 135 LDL 83 HDL 37 non-HDL cholesterol 98  Physical Exam:    VS:  BP 124/90   Pulse 86   Temp 98.2 F (36.8 C)   Ht 5' (1.524 m)   Wt 148 lb (67.1 kg)   LMP  (LMP Unknown)   SpO2 98%   BMI 28.90 kg/m     Wt Readings from Last 3 Encounters:  05/24/19 148 lb (67.1 kg)  05/10/19 150 lb (68 kg)  01/04/19 152 lb 9.6 oz (69.2 kg)     GEN:  Well nourished, well developed in no acute distress HEENT: Normal NECK: No JVD; No carotid bruits LYMPHATICS: No lymphadenopathy CARDIAC: RRR, no murmurs, rubs, gallops RESPIRATORY:  Clear to auscultation without rales, wheezing or rhonchi  ABDOMEN: Soft, non-tender, non-distended MUSCULOSKELETAL:  No edema; No deformity  SKIN: Warm and dry he has a superficial abrasion of the lower extremities anterior lower calf which does not appear infected.  I advised her continued local wound care NEUROLOGIC:  Alert and oriented x 3 PSYCHIATRIC:  Normal affect    Signed, Shirlee More, MD  05/24/2019 11:40 AM    [place]

## 2019-05-24 ENCOUNTER — Encounter: Payer: Self-pay | Admitting: Cardiology

## 2019-05-24 ENCOUNTER — Ambulatory Visit (INDEPENDENT_AMBULATORY_CARE_PROVIDER_SITE_OTHER): Payer: Medicare PPO | Admitting: Cardiology

## 2019-05-24 ENCOUNTER — Other Ambulatory Visit: Payer: Self-pay

## 2019-05-24 VITALS — BP 124/90 | HR 86 | Temp 98.2°F | Ht 60.0 in | Wt 148.0 lb

## 2019-05-24 DIAGNOSIS — I422 Other hypertrophic cardiomyopathy: Secondary | ICD-10-CM | POA: Diagnosis not present

## 2019-05-24 DIAGNOSIS — E785 Hyperlipidemia, unspecified: Secondary | ICD-10-CM | POA: Diagnosis not present

## 2019-05-24 DIAGNOSIS — I447 Left bundle-branch block, unspecified: Secondary | ICD-10-CM

## 2019-05-24 DIAGNOSIS — I493 Ventricular premature depolarization: Secondary | ICD-10-CM

## 2019-05-24 MED ORDER — HYDROCHLOROTHIAZIDE 25 MG PO TABS: 12.5000 mg | ORAL_TABLET | ORAL | 3 refills | Status: DC

## 2019-05-24 NOTE — Addendum Note (Signed)
Addended by: Aris Georgia, Connie Hilgert L on: 05/24/2019 12:01 PM   Modules accepted: Orders

## 2019-05-24 NOTE — Patient Instructions (Addendum)
Medication Instructions:  Your physician has recommended you make the following change in your medication:  1-Decrease Hydrochlorothiazide 12.5 (1/2 tablet) by mouth Monday, Wednesday, and Friday.  *If you need a refill on your cardiac medications before your next appointment, please call your pharmacy*  Lab Work: If you have labs (blood work) drawn today and your tests are completely normal, you will receive your results only by: Marland Kitchen MyChart Message (if you have MyChart) OR . A paper copy in the mail If you have any lab test that is abnormal or we need to change your treatment, we will call you to review the results.  Follow-Up: At [facility], you and your health needs are our priority.  As part of our continuing mission to provide you with exceptional heart care, we have created designated Provider Care Teams.  These Care Teams include your primary Cardiologist (physician) and Advanced Practice Providers (APPs -  Physician Assistants and Nurse Practitioners) who all work together to provide you with the care you need, when you need it.  We recommend signing up for the patient portal called "MyChart".  Sign up information is provided on this After Visit Summary.  MyChart is used to connect with patients for Virtual Visits (Telemedicine).  Patients are able to view lab/test results, encounter notes, upcoming appointments, etc.  Non-urgent messages can be sent to your provider as well.   To learn more about what you can do with MyChart, go to [email or web address].    Your next appointment:   12 month(s)  The format for your next appointment:   In Person  Provider:   You may see Shirlee More, MD or the following Advanced Practice Provider on your designated Care Team:    Laurann Montana, FNP

## 2019-10-18 ENCOUNTER — Other Ambulatory Visit: Payer: Self-pay | Admitting: Cardiology

## 2019-11-25 ENCOUNTER — Other Ambulatory Visit: Payer: Self-pay

## 2019-11-25 ENCOUNTER — Ambulatory Visit: Payer: Medicare PPO | Admitting: Allergy and Immunology

## 2019-11-25 ENCOUNTER — Encounter: Payer: Self-pay | Admitting: Allergy and Immunology

## 2019-11-25 VITALS — BP 120/74 | HR 79 | Temp 98.7°F | Resp 18

## 2019-11-25 DIAGNOSIS — J3089 Other allergic rhinitis: Secondary | ICD-10-CM

## 2019-11-25 DIAGNOSIS — J4540 Moderate persistent asthma, uncomplicated: Secondary | ICD-10-CM

## 2019-11-25 DIAGNOSIS — K219 Gastro-esophageal reflux disease without esophagitis: Secondary | ICD-10-CM | POA: Diagnosis not present

## 2019-11-25 MED ORDER — OLOPATADINE HCL 0.1 % OP SOLN: 1.0000 [drp] | Freq: Two times a day (BID) | OPHTHALMIC | 5 refills | Status: DC | PRN

## 2019-11-25 NOTE — Patient Instructions (Addendum)
Moderate persistent asthma  For now, continue Flovent 110 g, 2 inhalations twice a day.  Continue montelukast 10 mg daily bedtime.  Continue albuterol HFA, 1-2 inhalations every 4-6 hours as needed and 15 minutes prior to exercise.   During respiratory tract infections or asthma flares, increase Flovent 110 g to 3 inhalations 3 times per day until symptoms have returned to baseline.  Subjective and objective measures of pulmonary function will be followed and the treatment plan will be adjusted accordingly.  Allergic rhinitis  Continue appropriate allergen avoidance measures and Nasonex, 2 sprays per nostril daily as needed.  Nasal saline spray (i.e., Simply Saline) or nasal saline lavage (i.e., NeilMed) is recommended as needed and prior to medicated nasal sprays.  For thick post nasal drainage, add guaifenesin [phone] mg (Mucinex)  twice daily as needed with adequate hydration as discussed.  GERD (gastroesophageal reflux disease) Poorly controlled.  Continue appropriate reflux lifestyle modifications.   Continue lansoprazole as prescribed.  For now, add famotidine (Pepcid) 20 mg twice daily.  Follow-up with gastroenterologist.   Return in about 4 months (around 03/26/2020), or if symptoms worsen or fail to improve.

## 2019-11-25 NOTE — Progress Notes (Signed)
Follow-up Note  RE: Laurie Bowman MRN: [id number] DOB: 03/07/1941 Date of Office Visit: 11/25/2019  Primary care provider: Derrill Center., MD Referring provider: Derrill Center., MD  History of present illness: Laurie Bowman is a 79 y.o. female with persistent asthma, allergic rhinitis, and esophageal reflux presenting today for follow-up.  She was last in this clinic on May 10, 2019. She reports that she requires albuterol rescue 1-2 times per week on average, typically triggered by hot/humid weather.  She uses albuterol preventatively prior to exercise.  She is currently taking Flovent 110 g, 2 inhalations twice a day, and montelukast 10 mg daily. Laurie Bowman reports that her nasal allergy symptoms have been well controlled.  She does experience ocular pruritus and lacrimation if she does not take the olopatadine 0.1% drops 1-2 times daily.  She requests a refill for the olopatadine drops. She reports that over the past couple months she has been experiencing frequent belching, gas, and abdominal bloating despite taking proton pump inhibitor as prescribed.  She has limited her intake of coffee, spaghetti, raw vegetables, oranges, and bananas.  She believes that she is due for EGD later this year.  Assessment and plan: Moderate persistent asthma  For now, continue Flovent 110 g, 2 inhalations twice a day.  Continue montelukast 10 mg daily bedtime.  Continue albuterol HFA, 1-2 inhalations every 4-6 hours as needed and 15 minutes prior to exercise.   During respiratory tract infections or asthma flares, increase Flovent 110 g to 3 inhalations 3 times per day until symptoms have returned to baseline.  Subjective and objective measures of pulmonary function will be followed and the treatment plan will be adjusted accordingly.  Allergic rhinitis  Continue appropriate allergen avoidance measures and Nasonex, 2 sprays per nostril daily as needed.  Nasal saline spray (i.e., Simply  Saline) or nasal saline lavage (i.e., NeilMed) is recommended as needed and prior to medicated nasal sprays.  For thick post nasal drainage, add guaifenesin [phone] mg (Mucinex)  twice daily as needed with adequate hydration as discussed.  GERD (gastroesophageal reflux disease) Poorly controlled.  Continue appropriate reflux lifestyle modifications.   Continue lansoprazole as prescribed.  For now, add famotidine (Pepcid) 20 mg twice daily.  Follow-up with gastroenterologist.   Meds ordered this encounter  Medications  . olopatadine (PATANOL) 0.1 % ophthalmic solution    Sig: Place 1 drop into both eyes 2 (two) times daily as needed.    Dispense:  5 mL    Refill:  5    Diagnostics: Spirometry:  Normal with an FEV1 of 99% predicted. This study was performed while the patient was asymptomatic.  Please see scanned spirometry results for details.    Physical examination: Blood pressure 120/74, pulse 79, temperature 98.7 F (37.1 C), temperature source Oral, resp. rate 18, SpO2 96 %.  General: Alert, interactive, in no acute distress. HEENT: TMs pearly gray, turbinates mildly edematous without discharge, post-pharynx unremarkable. Neck: Supple without lymphadenopathy. Lungs: Clear to auscultation without wheezing, rhonchi or rales. CV: Normal S1, S2 without murmurs. Skin: Warm and dry, without lesions or rashes.  The following portions of the patient's history were reviewed and updated as appropriate: allergies, current medications, past family history, past medical history, past social history, past surgical history and problem list.   Current Outpatient Medications  Medication Sig Dispense Refill  . albuterol (PROAIR HFA) 108 (90 Base) MCG/ACT inhaler Use 1-2 puffs every 4-6 hours as needed for cough or wheeze. 18 g 1  . albuterol (  PROVENTIL) (2.5 MG/3ML) 0.083% nebulizer solution Take 3 mLs (2.5 mg total) by nebulization every 6 (six) hours as needed for wheezing or  shortness of breath. 150 mL 1  . aspirin 81 MG chewable tablet Chew 81 mg by mouth daily.     Marland Kitchen augmented betamethasone dipropionate (DIPROLENE-AF) 0.05 % cream Apply 9.35 application topically daily.    . benzonatate (TESSALON) 100 MG capsule Take 100 mg by mouth daily as needed.     . Biotin 1000 MCG tablet Take 1,000 mcg by mouth daily.     Marland Kitchen buPROPion (WELLBUTRIN SR) 150 MG 12 hr tablet Take 150 mg by mouth daily.     . calcium carbonate (OS-CAL) 600 MG TABS tablet Take 1 tablet by mouth 2 (two) times daily.     . calcium-vitamin D (OSCAL WITH D) 500-200 MG-UNIT TABS tablet Take by mouth.    . Cholecalciferol (VITAMIN D-1000 MAX ST) 1000 units tablet Take 1,000 Units by mouth daily.     . clobetasol cream (TEMOVATE) 0.05 %     . clotrimazole-betamethasone (LOTRISONE) cream Apply BID    . diclofenac Sodium (VOLTAREN) 1 % GEL     . dicyclomine (BENTYL) 20 MG tablet TAKE 1 TABLET (20 MG TOTAL) BY MOUTH EVERY 6 (SIX) HOURS AS NEEDED.    . fexofenadine (ALLEGRA) 180 MG tablet Take 180 mg by mouth daily.     . fluocinonide cream (LIDEX) 7.01 % Apply 1 application topically 2 (two) times daily.     . fluticasone (FLOVENT HFA) 110 MCG/ACT inhaler Inhale 2 puffs into the lungs 2 (two) times daily. Use with spacer. 1 Inhaler 5  . hydrochlorothiazide (HYDRODIURIL) 25 MG tablet Take 0.5 tablets (12.5 mg total) by mouth every Monday, Wednesday, and Friday. 20 tablet 3  . lansoprazole (PREVACID) 30 MG capsule Take 30 mg by mouth 2 (two) times daily before a meal.     . mometasone (NASONEX) 50 MCG/ACT nasal spray Place 2 sprays into the nose daily as needed. 17 g 5  . montelukast (SINGULAIR) 10 MG tablet TAKE 1 TABLET BY MOUTH EVERYDAY AT BEDTIME 30 tablet 5  . olopatadine (PATANOL) 0.1 % ophthalmic solution Place 1 drop into both eyes 2 (two) times daily as needed. 5 mL 5  . Polyethylene Glycol 3350 (PEG 3350) POWD Take 17 g by mouth daily.     . potassium chloride (KLOR-CON M10) 10 MEQ tablet 10 mEq 2  (two) times daily.     . rosuvastatin (CRESTOR) 5 MG tablet Take 5 mg by mouth daily.     Marland Kitchen tiZANidine (ZANAFLEX) 4 MG tablet Take 4 mg by mouth as needed for muscle spasms.     Marland Kitchen triamcinolone cream (KENALOG) 0.1 % APPLY A THIN FILM TO THE AFFECTED SKIN AREAS BY TOPICAL ROUTE 2 TIMESPER DAY    . verapamil (CALAN-SR) 180 MG CR tablet TAKE 1 TABLET BY MOUTH EVERY DAY 90 tablet 1   Current Facility-Administered Medications  Medication Dose Route Frequency Provider Last Rate Last Admin  . predniSONE (DELTASONE) tablet 10 mg  10 mg Oral Q breakfast Nazar Kuan, [place] Muta, MD        Allergies  Allergen Reactions  . Cefaclor Itching    Other reaction(s): Flushing (ALLERGY/intolerance)  . Codeine Itching and Nausea And Vomiting  . Fructose Swelling    Swelling and GI upset Other reaction(s): GI Upset (intolerance) Fructose Granules   . Sulfa Antibiotics Swelling  . Meperidine Nausea And Vomiting and Itching  . Sulfur Swelling  .  Other Nausea And Vomiting    Review of systems: Review of systems negative except as noted in HPI / PMHx.   Past Medical History:  Diagnosis Date  . Asthma   . Cancer ([place])   . Hypertension   . Urticaria     Family History  Problem Relation Age of Onset  . Congestive Heart Failure Mother   . Hypertension Mother   . Hyperlipidemia Sister   . Dementia Sister   . Alzheimer's disease Sister   . Stroke Maternal Grandmother   . Breast cancer Sister   . Hyperlipidemia Sister   . Allergic rhinitis Neg Hx   . Angioedema Neg Hx   . Asthma Neg Hx   . Eczema Neg Hx   . Immunodeficiency Neg Hx   . Urticaria Neg Hx   . Atopy Neg Hx     Social History   Socioeconomic History  . Marital status: Married    Spouse name: Not on file  . Number of children: Not on file  . Years of education: Not on file  . Highest education level: Not on file  Occupational History  . Not on file  Tobacco Use  . Smoking status: Former Smoker    Types: Cigarettes  .  Smokeless tobacco: Never Used  Vaping Use  . Vaping Use: Never used  Substance and Sexual Activity  . Alcohol use: No    Alcohol/week: 0.0 standard drinks  . Drug use: No  . Sexual activity: Never  Other Topics Concern  . Not on file  Social History Narrative  . Not on file   Social Determinants of Health   Financial Resource Strain:   . Difficulty of Paying Living Expenses: Not on file  Food Insecurity:   . Worried About Charity fundraiser in the Last Year: Not on file  . Ran Out of Food in the Last Year: Not on file  Transportation Needs:   . Lack of Transportation (Medical): Not on file  . Lack of Transportation (Non-Medical): Not on file  Physical Activity:   . Days of Exercise per Week: Not on file  . Minutes of Exercise per Session: Not on file  Stress:   . Feeling of Stress : Not on file  Social Connections:   . Frequency of Communication with Friends and Family: Not on file  . Frequency of Social Gatherings with Friends and Family: Not on file  . Attends Religious Services: Not on file  . Active Member of Clubs or Organizations: Not on file  . Attends Archivist Meetings: Not on file  . Marital Status: Not on file  Intimate Partner Violence:   . Fear of Current or Ex-Partner: Not on file  . Emotionally Abused: Not on file  . Physically Abused: Not on file  . Sexually Abused: Not on file    I appreciate the opportunity to take part in Keali's care. Please do not hesitate to contact me with questions.  Sincerely,   R. Edgar Frisk, MD

## 2019-11-25 NOTE — Assessment & Plan Note (Signed)
Poorly controlled.  Continue appropriate reflux lifestyle modifications.   Continue lansoprazole as prescribed.  For now, add famotidine (Pepcid) 20 mg twice daily.  Follow-up with gastroenterologist.

## 2019-11-25 NOTE — Assessment & Plan Note (Signed)
   Continue appropriate allergen avoidance measures and Nasonex, 2 sprays per nostril daily as needed.  Nasal saline spray (i.e., Simply Saline) or nasal saline lavage (i.e., NeilMed) is recommended as needed and prior to medicated nasal sprays.  For thick post nasal drainage, add guaifenesin [phone] mg (Mucinex)  twice daily as needed with adequate hydration as discussed.

## 2019-11-25 NOTE — Assessment & Plan Note (Signed)
   For now, continue Flovent 110 g, 2 inhalations twice a day.  Continue montelukast 10 mg daily bedtime.  Continue albuterol HFA, 1-2 inhalations every 4-6 hours as needed and 15 minutes prior to exercise.   During respiratory tract infections or asthma flares, increase Flovent 110 g to 3 inhalations 3 times per day until symptoms have returned to baseline.  Subjective and objective measures of pulmonary function will be followed and the treatment plan will be adjusted accordingly.

## 2020-02-11 ENCOUNTER — Other Ambulatory Visit: Payer: Self-pay | Admitting: Allergy and Immunology

## 2020-03-28 ENCOUNTER — Ambulatory Visit: Payer: Medicare PPO | Admitting: Allergy and Immunology

## 2020-03-28 ENCOUNTER — Other Ambulatory Visit: Payer: Self-pay | Admitting: Cardiology

## 2020-04-18 ENCOUNTER — Other Ambulatory Visit: Payer: Self-pay

## 2020-04-18 ENCOUNTER — Ambulatory Visit: Payer: Medicare PPO | Admitting: Allergy & Immunology

## 2020-04-18 ENCOUNTER — Encounter: Payer: Self-pay | Admitting: Allergy & Immunology

## 2020-04-18 VITALS — BP 128/80 | HR 82 | Temp 96.7°F | Ht 60.0 in | Wt 149.6 lb

## 2020-04-18 DIAGNOSIS — J4540 Moderate persistent asthma, uncomplicated: Secondary | ICD-10-CM | POA: Diagnosis not present

## 2020-04-18 DIAGNOSIS — K219 Gastro-esophageal reflux disease without esophagitis: Secondary | ICD-10-CM | POA: Diagnosis not present

## 2020-04-18 DIAGNOSIS — J3089 Other allergic rhinitis: Secondary | ICD-10-CM

## 2020-04-18 MED ORDER — OLOPATADINE HCL 0.1 % OP SOLN: 1.0000 [drp] | Freq: Two times a day (BID) | OPHTHALMIC | 5 refills | Status: AC | PRN

## 2020-04-18 MED ORDER — OLOPATADINE HCL 0.1 % OP SOLN: 1.0000 [drp] | Freq: Two times a day (BID) | OPHTHALMIC | 5 refills | Status: DC | PRN

## 2020-04-18 MED ORDER — MONTELUKAST SODIUM 10 MG PO TABS: 10.0000 mg | ORAL_TABLET | Freq: Every day | ORAL | 0 refills | Status: DC

## 2020-04-18 MED ORDER — ALBUTEROL SULFATE HFA 108 (90 BASE) MCG/ACT IN AERS: INHALATION_SPRAY | RESPIRATORY_TRACT | 1 refills | Status: DC

## 2020-04-18 NOTE — Patient Instructions (Addendum)
1. Moderate persistent asthma, uncomplicated - Lung testing looks great today. - We are not going to make any medication changes at this time since you are doing so well.  - Continue with Flovent 142mcg two puffs twice daily and Singulair.  - Continue with albuterol 2-4 puffs every 4-6 hours as needed.   2. Allergic rhinitis - Continue with the Nasonex one spray per nostril twice daily as needed.  - Continue with an antihistamine as needed.   3. Gastroesophageal reflux disease - Continue with your Prevacid and dietary restrictions.   4. Return in about 6 months (around 10/16/2020).   Please inform us of any Emergency Department visits, hospitalizations, or changes in symptoms. Call us before going to the ED for breathing or allergy symptoms since we might be able to fit you in for a sick visit. Feel free to contact us anytime with any questions, problems, or concerns.  It was a pleasure to meet you today!  Websites that have reliable patient information: 1. American Academy of Asthma, Allergy, and Immunology: www.aaaai.org 2. Food Allergy Research and Education (FARE): foodallergy.org 3. Mothers of Asthmatics: http://www.asthmacommunitynetwork.org 4. American College of Allergy, Asthma, and Immunology: www.acaai.org   COVID-19 Vaccine Information can be found at: ShippingScam.co.uk For questions related to vaccine distribution or appointments, please email vaccine@[email or web address] .com or call [phone].     "Like" [place] on Facebook and Instagram for our latest updates!       Make sure you are registered to vote! If you have moved or changed any of your contact information, you will need to get this updated before voting!  In some cases, you MAY be able to register to vote online: [email or web address]

## 2020-04-18 NOTE — Progress Notes (Signed)
FOLLOW UP  Date of Service/Encounter:  04/18/20   Assessment:   Moderate persistent asthma, uncomplicated  Allergic rhinitis  GERD  Plan/Recommendations:   1. Moderate persistent asthma, uncomplicated - Lung testing looks great today. - We are not going to make any medication changes at this time since you are doing so well.  - Continue with Flovent 12mcg two puffs twice daily and Singulair.  - Continue with albuterol 2-4 puffs every 4-6 hours as needed.   2. Allergic rhinitis - Continue with the Nasonex one spray per nostril twice daily as needed.  - Continue with an antihistamine as needed.   3. Gastroesophageal reflux disease - Continue with your Prevacid and dietary restrictions.   4. Return in about 6 months (around 10/16/2020).   Subjective:   Laurie Bowman is a 80 y.o. female presenting today for follow up of  Chief Complaint  Patient presents with  . Asthma    Laurie Bowman has a history of the following: Patient Active Problem List   Diagnosis Date Noted  . 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 Hypertrophic cardiomyopathy ([place]) 04/30/2018  . Mitral regurgitation 04/30/2018  . LBBB (left bundle branch block) 04/30/2018  . Hypertensive heart disease 04/30/2018  . PVC's (premature ventricular contractions) 04/30/2018  . Cough 10/15/2016  . Asthma with acute exacerbation 06/10/2016  . Acute sinusitis 06/10/2016  . GERD (gastroesophageal reflux disease) 02/06/2016  . Other specified disorders of eyelid 11/09/2015  . Moderate persistent asthma 09/28/2015  . Allergic rhinitis 09/28/2015  . Osteopenia 06/21/2015  . Encounter for long-term (current) use of high-risk medication 06/20/2015  . Wound dehiscence 05/08/2015  . 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 Peripheral visual field defect of both eyes 04/21/2015  . Dermatochalasis of both upper eyelids 04/14/2015  . 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 Alopecia  12/16/2013  . Asthma 12/16/2013  . Benign neoplasm of colon 12/16/2013  . Constipation 12/16/2013  . Dizziness 12/16/2013  . Fatty (change of) liver, not elsewhere classified 12/16/2013  . Undiagnosed cardiac murmurs 12/16/2013  . Heart murmur, systolic [id number]  . Hyperlipidemia, unspecified 12/16/2013  . Irritable bowel syndrome 12/16/2013  . 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 Major depressive disorder, recurrent, unspecified ([place]) 12/16/2013  . Pain in unspecified knee 12/16/2013  . Carpal tunnel syndrome 12/16/2013  . Rotator cuff tendonitis 12/16/2013  . Ventricular premature depolarization 12/16/2013  . Abnormal mammogram 08/16/2013  . Benign essential hypertension 07/08/2011  . Family history of breast cancer in sister 07/08/2011  . Fibrocystic breast changes 07/08/2011    History obtained from: chart review and patient.  Laurie Bowman is a 80 y.o. female presenting for a follow up visit. She was last seen in September 2021 by Dr. Verlin Fester. At that time, we continued with Flovent 174mcg two puffs BID as well as montelukast and albuterol. For his allergic rhinitis, she was continued on Nasonex as well as nasal saline rinses and Mucinex as needed.  Her GERD was controlled with lansoprazole.  Since the last visit, she has done remarkably well.  She has been followed in this practice for 20+ years.  She was initially evaluated by Dr. Shaune Leeks, and marked his daughter and Dr. Truddie Crumble daughter her friends.  Asthma/Respiratory Symptom History: She remains on the Flovent 2 puffs twice daily.  She has not been using her rescue inhaler much at all.  She also remains on the Singulair which is providing a lot of relief.   She  has not needed prednisone or been to the emergency room at all since last visit.  Allergic Rhinitis Symptom History: She has been using her nasal spray as well as her montelukast.  She has not needed any antibiotics at all.  She does report some dry eyes and  would like a refill of her Patanol.  She underwent an endoscopy and colonoscopy recently that was largely normal. She had an abdominal CT that was normal as well. She has Barrett's esophagus. She is on Miralax nightly.  This is all for treatment of her IBS.  She was on tamoxifen for just under 5 years. This unfortunately led to liver dysfunction.   Otherwise, there have been no changes to her past medical history, surgical history, family history, or social history.    Review of Systems  Constitutional: Negative.  Negative for chills, fever, malaise/fatigue and weight loss.  HENT: Negative for congestion, ear discharge, ear pain and sinus pain.   Eyes: Negative for pain, discharge and redness.  Respiratory: Negative for cough, sputum production, shortness of breath and wheezing.   Cardiovascular: Negative.  Negative for chest pain and palpitations.  Gastrointestinal: Negative for abdominal pain, constipation, diarrhea, heartburn, nausea and vomiting.  Skin: Negative.  Negative for itching and rash.  Neurological: Negative for dizziness and headaches.  Endo/Heme/Allergies: Negative for environmental allergies. Does not bruise/bleed easily.       Objective:   Blood pressure 128/80, pulse 82, temperature (!) 96.7 F (35.9 C), height 5' (1.524 m), weight 149 lb 9.6 oz (67.9 kg), SpO2 95 %. Body mass index is 29.22 kg/m.   Physical Exam:  Physical Exam Constitutional:      Appearance: She is well-developed.     Comments: Talkative.   HENT:     Head: Normocephalic and atraumatic.     Right Ear: Tympanic membrane, ear canal and external ear normal.     Left Ear: Tympanic membrane, ear canal and external ear normal.     Nose: No nasal deformity, septal deviation, mucosal edema, rhinorrhea or epistaxis.     Right Turbinates: Enlarged and swollen.     Left Turbinates: Enlarged and swollen.     Right Sinus: No maxillary sinus tenderness or frontal sinus tenderness.     Left Sinus:  No maxillary sinus tenderness or frontal sinus tenderness.     Mouth/Throat:     Mouth: Oropharynx is clear and moist. Mucous membranes are not pale and not dry.     Pharynx: Uvula midline.  Eyes:     General:        Right eye: No discharge.        Left eye: No discharge.     Extraocular Movements: EOM normal.     Conjunctiva/sclera: Conjunctivae normal.     Right eye: Right conjunctiva is not injected. No chemosis.    Left eye: Left conjunctiva is not injected. No chemosis.    Pupils: Pupils are equal, round, and reactive to light.  Cardiovascular:     Rate and Rhythm: Normal rate and regular rhythm.     Heart sounds: Murmur heard.   Systolic murmur is present with a grade of 4/6.   Pulmonary:     Effort: Pulmonary effort is normal. No tachypnea, accessory muscle usage or respiratory distress.     Breath sounds: Normal breath sounds. No wheezing, rhonchi or rales.  Chest:     Chest wall: No tenderness.     Comments: Moving air well in all lung fields.  Lymphadenopathy:  Cervical: No cervical adenopathy.  Skin:    General: Skin is warm.     Capillary Refill: Capillary refill takes less than 2 seconds.     Coloration: Skin is not pale.     Findings: No abrasion, erythema, petechiae or rash. Rash is not papular, urticarial or vesicular.  Neurological:     Mental Status: She is alert.  Psychiatric:        Mood and Affect: Mood and affect normal.        Behavior: Behavior is cooperative.      Diagnostic studies:    Spirometry: results normal (FEV1: 1.59/96%, FVC: 1.95/89%, FEV1/FVC: 80%).    Spirometry consistent with normal pattern.   Allergy Studies: none      Salvatore Marvel, MD  [facility]

## 2020-04-20 ENCOUNTER — Other Ambulatory Visit: Payer: Self-pay | Admitting: Cardiology

## 2020-04-20 NOTE — Telephone Encounter (Signed)
Refill sent to pharmacy.   

## 2020-05-12 DIAGNOSIS — C801 Malignant (primary) neoplasm, unspecified: Secondary | ICD-10-CM | POA: Insufficient documentation

## 2020-05-12 DIAGNOSIS — I10 Essential (primary) hypertension: Secondary | ICD-10-CM | POA: Insufficient documentation

## 2020-05-12 DIAGNOSIS — L509 Urticaria, unspecified: Secondary | ICD-10-CM | POA: Insufficient documentation

## 2020-05-16 ENCOUNTER — Other Ambulatory Visit: Payer: Self-pay | Admitting: Cardiology

## 2020-05-16 NOTE — Telephone Encounter (Signed)
[facility] sent with instructions to arrange an appt with Dr. Bettina Gavia for med management.

## 2020-05-19 ENCOUNTER — Ambulatory Visit: Payer: Medicare PPO | Admitting: Cardiology

## 2020-06-14 NOTE — Progress Notes (Signed)
Cardiology Office Note:    Date:  06/15/2020   ID:  Laurie Bowman, DOB [birth year], MRN [id number]  PCP:  Derrill Center., MD  Cardiologist:  Shirlee More, MD    Referring MD: Derrill Center., MD    ASSESSMENT:    1. Hypertrophic cardiomyopathy ([place])   2. LBBB (left bundle branch block)   3. PVC's (premature ventricular contractions)   4. Hypertensive heart disease without heart failure    PLAN:    In order of problems listed above:  1. Stable asymptomatic she has had a good response to calcium channel blocker diminished with resting gradient and I think we need to repeat an echocardiogram will intensify treatment at this point time 2. Stable EKG pattern 3. Stable asymptomatic not present on today's EKG 4. Blood pressure at target with current regimen including hydrochlorothiazide and verapamil 5. Lipids at target continue her statin   Next appointment: 1 year   Medication Adjustments/Labs and Tests Ordered: Current medicines are reviewed at length with the patient today.  Concerns regarding medicines are outlined above.  No orders of the defined types were placed in this encounter.  No orders of the defined types were placed in this encounter.   No chief complaint on file.   History of Present Illness:    Laurie Bowman is a 80 y.o. female with a hx of obstructive hypertrophic cardiomyopathy with peak gradient 43 mmHg 2019 hypertension mitral regurgitation left bundle branch block frequent PVCs and hyperlipidemia.  She wants last seen 05/24/2019.  Follow-up echocardiogram 11/25/2018 showed moderate concentric LVH and focal hypertrophy of the basilar septum 19 mm and a mean gradient of 10 mmHg across the outflow tract.  Compliance with diet, lifestyle and medications: Yes  Recent labs [facility] PCP 05/03/2020 Cholesterol 141 LDL 78 triglycerides 101 HDL 54 sodium 138 potassium 4.5 creatinine 1.04 GFR 55 cc  She continues to do well goes to the gym 3 days a  week no exercise intolerance chest pain shortness of breath palpitation or syncope. Her predominant complaints are allergy seasonal asthma and balance issues but she works with a Physiological scientist to address Past Medical History:  Diagnosis Date  . Asthma   . Cancer ([place])   . Hypertension   . Urticaria     Past Surgical History:  Procedure Laterality Date  . ABDOMINAL ADHESION SURGERY    . ADENOIDECTOMY    . BREAST SURGERY    . carpel tunnel    . CESAREAN SECTION    . KNEE SURGERY    . LEG SURGERY     bone graft  . TONSILLECTOMY    . TONSILLECTOMY    . VEIN SURGERY     Right Leg    Current Medications: Current Meds  Medication Sig  . albuterol (PROAIR HFA) 108 (90 Base) MCG/ACT inhaler Use 1-2 puffs every 4-6 hours as needed for cough or wheeze.  Marland Kitchen albuterol (PROVENTIL) (2.5 MG/3ML) 0.083% nebulizer solution Take 3 mLs (2.5 mg total) by nebulization every 6 (six) hours as needed for wheezing or shortness of breath.  Marland Kitchen aspirin 81 MG chewable tablet Chew 81 mg by mouth daily.  Marland Kitchen augmented betamethasone dipropionate (DIPROLENE-AF) 0.05 % cream Apply 2.20 application topically daily.  . benzonatate (TESSALON) 100 MG capsule Take 100 mg by mouth daily as needed.   . Biotin 1000 MCG tablet Take 1,000 mcg by mouth daily.   Marland Kitchen buPROPion (WELLBUTRIN SR) 150 MG 12 hr tablet Take 150 mg by mouth daily.   Marland Kitchen  calcium carbonate (OS-CAL) 600 MG TABS tablet Take 1 tablet by mouth 2 (two) times daily.   . calcium-vitamin D (OSCAL WITH D) 500-200 MG-UNIT TABS tablet Take by mouth.  . Cholecalciferol 25 MCG (1000 UT) tablet Take 1,000 Units by mouth daily.   . clobetasol cream (TEMOVATE) 0.05 %   . clotrimazole-betamethasone (LOTRISONE) cream Apply BID  . diclofenac Sodium (VOLTAREN) 1 % GEL   . dicyclomine (BENTYL) 20 MG tablet TAKE 1 TABLET (20 MG TOTAL) BY MOUTH EVERY 6 (SIX) HOURS AS NEEDED.  . fexofenadine (ALLEGRA) 180 MG tablet Take 180 mg by mouth daily.   . fluocinonide cream (LIDEX)  1.30 % Apply 1 application topically 2 (two) times daily.   . fluticasone (FLOVENT HFA) 110 MCG/ACT inhaler Inhale 2 puffs into the lungs 2 (two) times daily. Use with spacer.  . hydrochlorothiazide (HYDRODIURIL) 25 MG tablet TAKE 0.5 TABLETS (12.5 MG TOTAL) BY MOUTH EVERY MONDAY, WEDNESDAY, AND FRIDAY.  Marland Kitchen lansoprazole (PREVACID) 30 MG capsule Take 30 mg by mouth 2 (two) times daily before a meal.   . mometasone (NASONEX) 50 MCG/ACT nasal spray Place 2 sprays into the nose daily as needed.  . montelukast (SINGULAIR) 10 MG tablet Take 1 tablet (10 mg total) by mouth at bedtime.  Marland Kitchen olopatadine (PATANOL) 0.1 % ophthalmic solution Place 1 drop into both eyes 2 (two) times daily as needed.  . Polyethylene Glycol 3350 (PEG 3350) POWD Take 17 g by mouth daily.   . potassium chloride (KLOR-CON) 10 MEQ tablet 10 mEq 2 (two) times daily.   . rosuvastatin (CRESTOR) 5 MG tablet Take 5 mg by mouth daily.   Marland Kitchen tiZANidine (ZANAFLEX) 4 MG tablet Take 4 mg by mouth as needed for muscle spasms.   Marland Kitchen triamcinolone cream (KENALOG) 0.1 % APPLY A THIN FILM TO THE AFFECTED SKIN AREAS BY TOPICAL ROUTE 2 TIMESPER DAY  . verapamil (CALAN-SR) 180 MG CR tablet TAKE 1 TABLET BY MOUTH EVERY DAY   Current Facility-Administered Medications for the 06/15/20 encounter (Office Visit) with Richardo Priest, MD  Medication  . predniSONE (DELTASONE) tablet 10 mg     Allergies:   Cefaclor, Codeine, Fructose, Sulfa antibiotics, Meperidine, Elemental sulfur, and Other   Social History   Socioeconomic History  . Marital status: Married    Spouse name: Not on file  . Number of children: Not on file  . Years of education: Not on file  . Highest education level: Not on file  Occupational History  . Not on file  Tobacco Use  . Smoking status: Former Smoker    Types: Cigarettes  . Smokeless tobacco: Never Used  Vaping Use  . Vaping Use: Never used  Substance and Sexual Activity  . Alcohol use: No    Alcohol/week: 0.0 standard  drinks  . Drug use: No  . Sexual activity: Never  Other Topics Concern  . Not on file  Social History Narrative  . Not on file   Social Determinants of Health   Financial Resource Strain: Not on file  Food Insecurity: Not on file  Transportation Needs: Not on file  Physical Activity: Not on file  Stress: Not on file  Social Connections: Not on file     Family History: The patient's family history includes Alzheimer's disease in her sister; Breast cancer in her sister; Congestive Heart Failure in her mother; Dementia in her sister; Hyperlipidemia in her sister and sister; Hypertension in her mother; Stroke in her maternal grandmother. There is no history of  Allergic rhinitis, Angioedema, Asthma, Eczema, Immunodeficiency, Urticaria, or Atopy. ROS:   Please see the history of present illness.    All other systems reviewed and are negative.  EKGs/Labs/Other Studies Reviewed:    The following studies were reviewed today:  EKG:  EKG ordered today and personally reviewed.  The ekg ordered today demonstrates sinus rhythm left bundle branch block    Physical Exam:    VS:  BP 140/82 (BP Location: Left Arm)   Pulse 84   Ht 5' (1.524 m)   Wt 151 lb 0.2 oz (68.5 kg)   LMP  (LMP Unknown)   SpO2 98%   BMI 29.49 kg/m     Wt Readings from Last 3 Encounters:  06/15/20 151 lb 0.2 oz (68.5 kg)  04/18/20 149 lb 9.6 oz (67.9 kg)  05/24/19 148 lb (67.1 kg)     GEN: Appears her age well nourished, well developed in no acute distress HEENT: Normal NECK: No JVD; No carotid bruits LYMPHATICS: No lymphadenopathy CARDIAC: 1-2 of 6 localized aortic midsystolic murmur does not involve S2 does not radiate to the carotids no aortic regurgitation RRR, no, rubs, gallops RESPIRATORY:  Clear to auscultation without rales, wheezing or rhonchi  ABDOMEN: Soft, non-tender, non-distended MUSCULOSKELETAL:  No edema; No deformity  SKIN: Warm and dry NEUROLOGIC:  Alert and oriented x 3 PSYCHIATRIC:   Normal affect    Signed, Shirlee More, MD  06/15/2020 8:54 AM    [place]

## 2020-06-15 ENCOUNTER — Other Ambulatory Visit: Payer: Self-pay

## 2020-06-15 ENCOUNTER — Encounter: Payer: Self-pay | Admitting: Cardiology

## 2020-06-15 ENCOUNTER — Ambulatory Visit: Payer: Medicare PPO | Admitting: Cardiology

## 2020-06-15 VITALS — BP 140/82 | HR 84 | Ht 60.0 in | Wt 151.0 lb

## 2020-06-15 DIAGNOSIS — I119 Hypertensive heart disease without heart failure: Secondary | ICD-10-CM | POA: Diagnosis not present

## 2020-06-15 DIAGNOSIS — I422 Other hypertrophic cardiomyopathy: Secondary | ICD-10-CM

## 2020-06-15 DIAGNOSIS — I493 Ventricular premature depolarization: Secondary | ICD-10-CM | POA: Diagnosis not present

## 2020-06-15 DIAGNOSIS — I447 Left bundle-branch block, unspecified: Secondary | ICD-10-CM | POA: Diagnosis not present

## 2020-06-15 MED ORDER — VERAPAMIL HCL ER 180 MG PO TBCR: 180.0000 mg | EXTENDED_RELEASE_TABLET | Freq: Every day | ORAL | 3 refills | Status: DC

## 2020-06-15 NOTE — Patient Instructions (Signed)

## 2020-07-25 ENCOUNTER — Other Ambulatory Visit: Payer: Self-pay | Admitting: Cardiology

## 2020-07-25 NOTE — Telephone Encounter (Signed)
Refill sent to pharmacy.   

## 2020-08-03 ENCOUNTER — Other Ambulatory Visit: Payer: Self-pay | Admitting: Allergy & Immunology

## 2020-08-11 ENCOUNTER — Other Ambulatory Visit: Payer: Self-pay

## 2020-08-11 MED ORDER — FLUTICASONE PROPIONATE HFA 110 MCG/ACT IN AERO: 2.0000 | INHALATION_SPRAY | Freq: Two times a day (BID) | RESPIRATORY_TRACT | 3 refills | Status: DC

## 2020-08-18 ENCOUNTER — Other Ambulatory Visit: Payer: Self-pay

## 2020-08-18 MED ORDER — FLUTICASONE PROPIONATE HFA 110 MCG/ACT IN AERO: 2.0000 | INHALATION_SPRAY | Freq: Two times a day (BID) | RESPIRATORY_TRACT | 2 refills | Status: DC

## 2020-09-27 DIAGNOSIS — A498 Other bacterial infections of unspecified site: Secondary | ICD-10-CM | POA: Insufficient documentation

## 2020-09-27 DIAGNOSIS — D72829 Elevated white blood cell count, unspecified: Secondary | ICD-10-CM | POA: Insufficient documentation

## 2020-09-27 DIAGNOSIS — E876 Hypokalemia: Secondary | ICD-10-CM | POA: Insufficient documentation

## 2020-09-27 DIAGNOSIS — R0789 Other chest pain: Secondary | ICD-10-CM

## 2020-09-27 DIAGNOSIS — I700 Atherosclerosis of aorta: Secondary | ICD-10-CM | POA: Insufficient documentation

## 2020-09-27 HISTORY — DX: Other bacterial infections of unspecified site: A49.8

## 2020-09-27 HISTORY — DX: Elevated white blood cell count, unspecified: D72.829

## 2020-09-27 HISTORY — DX: Atherosclerosis of aorta: I70.0

## 2020-09-27 HISTORY — DX: Other chest pain: R07.89

## 2020-09-27 HISTORY — DX: Hypokalemia: E87.6

## 2020-10-17 ENCOUNTER — Ambulatory Visit: Payer: Medicare PPO | Admitting: Allergy & Immunology

## 2020-11-03 ENCOUNTER — Other Ambulatory Visit: Payer: Self-pay | Admitting: Allergy & Immunology

## 2020-11-06 ENCOUNTER — Other Ambulatory Visit: Payer: Self-pay | Admitting: Allergy & Immunology

## 2020-12-05 ENCOUNTER — Other Ambulatory Visit: Payer: Self-pay

## 2020-12-05 ENCOUNTER — Encounter: Payer: Self-pay | Admitting: Allergy & Immunology

## 2020-12-05 ENCOUNTER — Ambulatory Visit: Payer: Medicare PPO | Admitting: Allergy & Immunology

## 2020-12-05 VITALS — BP 130/82 | HR 89 | Temp 98.0°F | Ht 60.0 in | Wt 146.8 lb

## 2020-12-05 DIAGNOSIS — J4540 Moderate persistent asthma, uncomplicated: Secondary | ICD-10-CM

## 2020-12-05 DIAGNOSIS — J3089 Other allergic rhinitis: Secondary | ICD-10-CM | POA: Diagnosis not present

## 2020-12-05 DIAGNOSIS — K219 Gastro-esophageal reflux disease without esophagitis: Secondary | ICD-10-CM

## 2020-12-05 MED ORDER — FLUTICASONE PROPIONATE HFA 110 MCG/ACT IN AERO: 2.0000 | INHALATION_SPRAY | Freq: Two times a day (BID) | RESPIRATORY_TRACT | 2 refills | Status: DC

## 2020-12-05 MED ORDER — ALBUTEROL SULFATE HFA 108 (90 BASE) MCG/ACT IN AERS: INHALATION_SPRAY | RESPIRATORY_TRACT | 1 refills | Status: DC

## 2020-12-05 NOTE — Patient Instructions (Addendum)
1. Moderate persistent asthma, uncomplicated - Lung testing looks great today. - We are not going to make any medication changes at this time since you are doing so well.  - Daily controller medication(s): Singulair 10mg  daily and Flovent 112mcg 2 puffs once daily with spacer - Prior to physical activity: albuterol 2 puffs 10-15 minutes before physical activity. - Rescue medications: albuterol 4 puffs every 4-6 hours as needed - Changes during respiratory infections or worsening symptoms: Increase Flovent 181mcg to 4 puffs twice daily for TWO WEEKS. - Asthma control goals:  * Full participation in all desired activities (may need albuterol before activity) * Albuterol use two time or less a week on average (not counting use with activity) * Cough interfering with sleep two time or less a month * Oral steroids no more than once a year * No hospitalizations  2. Allergic rhinitis - Continue with the Nasonex one spray per nostril twice daily as needed.  - Continue with Allegra daily (can use twice daily on the worst days).  - Continue with Singulair (montelukast) 10mg  daiy.  - Try adding on Zatidor one drop per eye twice daily (you can get this at [employer]).  - Call us if this is not working.     3. Gastroesophageal reflux disease - Continue with your Prevacid and dietary restrictions.   4. Return in about 6 months (around 06/04/2021).   Please inform us of any Emergency Department visits, hospitalizations, or changes in symptoms. Call us before going to the ED for breathing or allergy symptoms since we might be able to fit you in for a sick visit. Feel free to contact us anytime with any questions, problems, or concerns.  It was a pleasure to meet you today!  Websites that have reliable patient information: 1. American Academy of Asthma, Allergy, and Immunology: www.aaaai.org 2. Food Allergy Research and Education (FARE): foodallergy.org 3. Mothers of Asthmatics:  http://www.asthmacommunitynetwork.org 4. American College of Allergy, Asthma, and Immunology: www.acaai.org   COVID-19 Vaccine Information can be found at: ShippingScam.[email or web address].uk For questions related to vaccine distribution or appointments, please email [email or web address] or call [phone].     "Like" [place] on Facebook and Instagram for our latest updates!       Make sure you are registered to vote! If you have moved or changed any of your contact information, you will need to get this updated before voting!  In some cases, you MAY be able to register to vote online: [email or web address]

## 2020-12-05 NOTE — Progress Notes (Signed)
FOLLOW UP  Date of Service/Encounter:  12/05/20   Assessment:   Moderate persistent asthma, uncomplicated  Perennial and seasonal allergic rhinitis  GERD  Plan/Recommendations:   1. Moderate persistent asthma, uncomplicated - Lung testing looks great today. - We are not going to make any medication changes at this time since you are doing so well.  - Daily controller medication(s): Singulair 10mg  daily and Flovent 16mcg 2 puffs once daily with spacer - Prior to physical activity: albuterol 2 puffs 10-15 minutes before physical activity. - Rescue medications: albuterol 4 puffs every 4-6 hours as needed - Changes during respiratory infections or worsening symptoms: Increase Flovent 148mcg to 4 puffs twice daily for TWO WEEKS. - Asthma control goals:  * Full participation in all desired activities (may need albuterol before activity) * Albuterol use two time or less a week on average (not counting use with activity) * Cough interfering with sleep two time or less a month * Oral steroids no more than once a year * No hospitalizations  2. Allergic rhinitis - Continue with the Nasonex one spray per nostril twice daily as needed.  - Continue with Allegra daily (can use twice daily on the worst days).  - Continue with Singulair (montelukast) 10mg  daiy.  - Try adding on Zatidor one drop per eye twice daily (you can get this at [employer]).  - Call us if this is not working.   3. Gastroesophageal reflux disease - Continue with your Prevacid and dietary restrictions.   4. Return in about 6 months (around 06/04/2021).   Subjective:   Daneille Desilva is a 80 y.o. 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   Hypertrophic cardiomyopathy ([place]) 04/30/2018   Mitral regurgitation 04/30/2018   LBBB (left bundle branch block) 04/30/2018   Hypertensive heart disease 04/30/2018   PVC's (premature ventricular contractions) 04/30/2018   Cough 10/15/2016   Asthma with acute exacerbation 06/10/2016   Acute sinusitis 06/10/2016   GERD (gastroesophageal reflux disease) 02/06/2016   Other specified disorders of eyelid 11/09/2015   Moderate persistent asthma 09/28/2015   Allergic rhinitis 09/28/2015   Osteopenia 06/21/2015   Encounter for long-term (current) use of high-risk medication 06/20/2015   Wound dehiscence 05/08/2015   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   Peripheral visual field defect of both eyes 04/21/2015   Dermatochalasis of both upper eyelids 04/14/2015   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   Alopecia 12/16/2013   Asthma 12/16/2013   Benign neoplasm of colon 12/16/2013   Constipation 12/16/2013   Dizziness 12/16/2013   Fatty (change of) liver, not elsewhere classified 12/16/2013   Undiagnosed cardiac murmurs 12/16/2013   Heart murmur, systolic 75/12/2583   Hyperlipidemia, unspecified 12/16/2013   Irritable bowel syndrome 27/78/2423   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   Major depressive disorder, recurrent, unspecified ([place]) 12/16/2013   Pain in unspecified knee 12/16/2013   Carpal tunnel syndrome 12/16/2013  Rotator cuff tendonitis 12/16/2013   Ventricular premature depolarization 12/16/2013   Abnormal mammogram 08/16/2013   Benign essential hypertension 07/08/2011   Family history of breast cancer in sister 07/08/2011   Fibrocystic breast changes 07/08/2011    History obtained from: chart review and patient.  Mariangel is a 80 y.o. female presenting for a follow up visit.   She was last seen in February 2022.  At that time, her lung testing looked great.  We did not make any medication changes.  We continue with Flovent 110 mcg 2 puffs twice daily and Singulair.  For the allergic rhinitis, we continue with Nasonex as well as an antihistamine as needed.  We also continue with Prevacid and dietary restrictions for control of reflux.  Since last visit, she has mostly done well.  Asthma/Respiratory Symptom History: She remains on Flovent 2 puffs twice daily.  She also is on Singulair 10 mg daily.  She has not been using her rescue inhaler much at all.  She has not needed systemic steroids for her symptoms.  She is sleeping well at night without any breakthrough symptoms.  Allergic Rhinitis Symptom History: She is having a lot of trouble with her eyes. They are itchy and watery. She has been using Patanol. Zatidor has been recommended.  But she has never been on allergy shots.  She has been using her Nasonex occasionally.  Her eyes and the most bothersome symptom.  Otherwise, there have been no changes to her past medical history, surgical history, family history, or social history.    Review of Systems  Constitutional: Negative.  Negative for chills, fever, malaise/fatigue and weight loss.  HENT: Negative.  Negative for congestion, ear discharge and ear pain.   Eyes:  Negative for pain, discharge and redness.  Respiratory:  Negative for cough, sputum production, shortness of breath and wheezing.   Cardiovascular: Negative.  Negative for chest pain and palpitations.  Gastrointestinal:  Negative for abdominal pain, constipation, diarrhea, heartburn, nausea and vomiting.  Skin: Negative.  Negative for itching and rash.  Neurological:  Negative for dizziness and headaches.  Endo/Heme/Allergies:  Negative for environmental allergies. Does not bruise/bleed easily.      Objective:   Blood pressure 130/82, pulse 89, temperature 98 F (36.7 C), height 5' (1.524 m),  weight 146 lb 12.8 oz (66.6 kg), SpO2 94 %. Body mass index is 28.67 kg/m.   Physical Exam:  Physical Exam Vitals reviewed.  Constitutional:      Appearance: She is well-developed.     Interventions: She is not intubated. HENT:     Head: Normocephalic and atraumatic.     Right Ear: Tympanic membrane, ear canal and external ear normal.     Left Ear: Tympanic membrane, ear canal and external ear normal.     Nose: No nasal deformity, septal deviation, mucosal edema or rhinorrhea.     Right Turbinates: Enlarged and swollen.     Left Turbinates: Enlarged and swollen.     Right Sinus: No maxillary sinus tenderness or frontal sinus tenderness.     Left Sinus: No maxillary sinus tenderness or frontal sinus tenderness.     Mouth/Throat:     Mouth: Mucous membranes are not pale and not dry.     Pharynx: Uvula midline.  Eyes:     General: Lids are normal. No allergic shiner.       Right eye: No discharge.        Left eye: No discharge.     Conjunctiva/sclera: Conjunctivae normal.  Right eye: Right conjunctiva is not injected. No chemosis.    Left eye: Left conjunctiva is not injected. No chemosis.    Pupils: Pupils are equal, round, and reactive to light.  Cardiovascular:     Rate and Rhythm: Normal rate and regular rhythm.     Heart sounds: Normal heart sounds.  Pulmonary:     Effort: Pulmonary effort is normal. No tachypnea, accessory muscle usage or respiratory distress. She is not intubated.     Breath sounds: Normal breath sounds. No wheezing, rhonchi or rales.     Comments: Moving air well in all lung fields. Chest:     Chest wall: No tenderness.  Lymphadenopathy:     Cervical: No cervical adenopathy.  Skin:    Coloration: Skin is not pale.     Findings: No abrasion, erythema, petechiae or rash. Rash is not papular, urticarial or vesicular.  Neurological:     Mental Status: She is alert.  Psychiatric:        Behavior: Behavior is cooperative.     Diagnostic studies:    Spirometry: results normal (FEV1: 1.38/85%, FVC: 1.62/74%, FEV1/FVC: 85%).    Spirometry consistent with normal pattern.   Allergy Studies: none       Salvatore Marvel, MD  [facility] [place]

## 2020-12-06 ENCOUNTER — Encounter: Payer: Self-pay | Admitting: Allergy & Immunology

## 2020-12-07 NOTE — Addendum Note (Signed)
Addended by: Larence Penning on: 12/07/2020 05:34 PM   Modules accepted: Orders

## 2021-05-03 DIAGNOSIS — S52532A Colles' fracture of left radius, initial encounter for closed fracture: Secondary | ICD-10-CM

## 2021-05-03 HISTORY — DX: Colles' fracture of left radius, initial encounter for closed fracture: S52.532A

## 2021-06-07 ENCOUNTER — Ambulatory Visit: Payer: Medicare PPO | Admitting: Allergy & Immunology

## 2021-06-14 ENCOUNTER — Other Ambulatory Visit: Payer: Self-pay | Admitting: Cardiology

## 2021-06-18 ENCOUNTER — Other Ambulatory Visit: Payer: Self-pay | Admitting: *Deleted

## 2021-06-18 MED ORDER — ALBUTEROL SULFATE (2.5 MG/3ML) 0.083% IN NEBU: 2.5000 mg | INHALATION_SOLUTION | Freq: Four times a day (QID) | RESPIRATORY_TRACT | 0 refills | Status: DC | PRN

## 2021-07-05 ENCOUNTER — Other Ambulatory Visit: Payer: Self-pay

## 2021-07-05 ENCOUNTER — Ambulatory Visit: Payer: Medicare PPO | Admitting: Allergy & Immunology

## 2021-07-05 ENCOUNTER — Encounter: Payer: Self-pay | Admitting: Allergy & Immunology

## 2021-07-05 VITALS — BP 140/90 | HR 75 | Temp 97.6°F | Resp 16

## 2021-07-05 DIAGNOSIS — J4540 Moderate persistent asthma, uncomplicated: Secondary | ICD-10-CM

## 2021-07-05 DIAGNOSIS — J3089 Other allergic rhinitis: Secondary | ICD-10-CM | POA: Diagnosis not present

## 2021-07-05 DIAGNOSIS — K219 Gastro-esophageal reflux disease without esophagitis: Secondary | ICD-10-CM | POA: Diagnosis not present

## 2021-07-05 NOTE — Patient Instructions (Addendum)
1. Moderate persistent asthma, uncomplicated ?- Lung testing looks great today. ?- We are going to make Flovent an as needed medication to use during respiratory flares for two weeks.  ?- If you are having worsening asthma symptoms and prednisone, we are going to have to get you back on Flovent DAILY.  ?- Daily controller medication(s): Singulair '10mg'$  daily  ?- Prior to physical activity: albuterol 2 puffs 10-15 minutes before physical activity. ?- Rescue medications: albuterol 4 puffs every 4-6 hours as needed or albuterol nebulizer one vial every 4-6 hours as needed ?- Changes during respiratory infections or worsening symptoms: Add on Flovent 153mg to 4 puffs twice daily for TWO WEEKS. ?- Asthma control goals:  ?* Full participation in all desired activities (may need albuterol before activity) ?* Albuterol use two time or less a week on average (not counting use with activity) ?* Cough interfering with sleep two time or less a month ?* Oral steroids no more than once a year ?* No hospitalizations ? ?2. Allergic rhinitis ?- Continue with the Nasonex one spray per nostril twice daily as needed.  ?- Continue with Allegra daily (can use twice daily on the worst days).  ?- Continue with Singulair (montelukast) '10mg'$  daiy.  ? ?3. Gastroesophageal reflux disease ?- Continue with your Prevacid every morning and dietary restrictions.  ?- You seem to have a good handle on your symptoms.  ? ?4. Return in about 6 months (around 01/04/2022).  ? ?Please inform u[place]of any Emergency Department visits, hospitalizations, or changes in symptoms. Call [place]efore going to the ED for breathing or allergy symptoms since we might be able to fit you in for a sick visit. Feel free to contact u[place]anytime with any questions, problems, or concerns. We do have sick visits open EVERY DAY!  ? ?It was a pleasure to see you again today! ? ?Websites that have reliable patient information: ?1. American Academy of Asthma, Allergy, and Immunology:  www.aaaai.org ?2. Food Allergy Research and Education (FARE): foodallergy.org ?3. Mothers of Asthmatics: http://www.asthmacommunitynetwork.org ?4. [employer] Corporationof Allergy, Asthma, and Immunology: wMonthlyElectricBill.co.uk? ? ?COVID-19 Vaccine Information can be found at: [email or web address].[email or web address].ukFor questions related to vaccine distribution or appointments, please email [email or web address] or call [phone]  ? ? ? ??Like? u[place]on Facebook and Instagram for our latest updates!  ?  ? ? ? ?Make sure you are registered to vote! If you have moved or changed any of your contact information, you will need to get this updated before voting! ? ?In some cases, you MAY be able to register to vote online: [email or web address]? ? ? ? ? ?

## 2021-07-05 NOTE — Progress Notes (Signed)
? ?FOLLOW UP ? ?Date of Service/Encounter:  07/05/21 ? ? ?Assessment:  ? ?Moderate persistent asthma, uncomplicated ?  ?Perennial and seasonal allergic rhinitis ?  ?GERD ? ?Plan/Recommendations:  ? ?1. Moderate persistent asthma, uncomplicated ?- We are going to make Flovent an as needed medication to use during respiratory flares for two weeks.  ?- If you are having worsening asthma symptoms and prednisone, we are going to have to get you back on Flovent DAILY.  ?- Daily controller medication(s): Singulair '10mg'$  daily  ?- Prior to physical activity: albuterol 2 puffs 10-15 minutes before physical activity. ?- Rescue medications: albuterol 4 puffs every 4-6 hours as needed or albuterol nebulizer one vial every 4-6 hours as needed ?- Changes during respiratory infections or worsening symptoms: Add on Flovent 154mg to 4 puffs twice daily for TWO WEEKS. ?- Asthma control goals:  ?* Full participation in all desired activities (may need albuterol before activity) ?* Albuterol use two time or less a week on average (not counting use with activity) ?* Cough interfering with sleep two time or less a month ?* Oral steroids no more than once a year ?* No hospitalizations ? ?2. Allergic rhinitis ?- Continue with the Nasonex one spray per nostril twice daily as needed.  ?- Continue with Allegra daily (can use twice daily on the worst days).  ?- Continue with Singulair (montelukast) '10mg'$  daiy.  ? ?3. Gastroesophageal reflux disease ?- Continue with your Prevacid every morning and dietary restrictions.  ?- You seem to have a good handle on your symptoms.  ? ?4. Return in about 6 months (around 01/04/2022).  ? ? ?Subjective:  ? ?Laurie Bowman a 81y.o. female presenting today for follow up of  ?Chief Complaint  ?Patient presents with  ? Asthma  ? ? ?MConnie Bowman a history of the following: ?Patient Active Problem List  ? Diagnosis Date Noted  ? Urticaria   ? Hypertension   ? Cancer ([facility]   ? CRF (chronic renal failure),  stage 2 (mild) 05/09/2019  ? Hypertrophic cardiomyopathy ([place] 04/30/2018  ? Mitral regurgitation 04/30/2018  ? LBBB (left bundle branch block) 04/30/2018  ? Hypertensive heart disease 04/30/2018  ? PVC's (premature ventricular contractions) 04/30/2018  ? Cough 10/15/2016  ? Asthma with acute exacerbation 06/10/2016  ? Acute sinusitis 06/10/2016  ? GERD (gastroesophageal reflux disease) 02/06/2016  ? Other specified disorders of eyelid 11/09/2015  ? Moderate persistent asthma 09/28/2015  ? Allergic rhinitis 09/28/2015  ? Osteopenia 06/21/2015  ? Encounter for long-term (current) use of high-risk medication 06/20/2015  ? Wound dehiscence 05/08/2015  ? 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  ? Peripheral visual field defect of both eyes 04/21/2015  ? Dermatochalasis of both upper eyelids 04/14/2015  ? 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 Alopecia 12/16/2013  ? Asthma 12/16/2013  ? Benign neoplasm of colon 12/16/2013  ? Constipation 12/16/2013  ? Dizziness 12/16/2013  ? Fatty (change of) liver, not elsewhere classified 12/16/2013  ? Undiagnosed cardiac murmurs 12/16/2013  ? Heart murmur, systolic 119/41/7408 ? Hyperlipidemia, unspecified 12/16/2013  ? Irritable bowel syndrome 12/16/2013  ? 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 Major depressive disorder, recurrent, unspecified ([place] 12/16/2013  ? Pain in unspecified knee 12/16/2013  ? Carpal tunnel syndrome 12/16/2013  ? Rotator cuff tendonitis 12/16/2013  ? Ventricular premature depolarization 12/16/2013  ? Abnormal mammogram 08/16/2013  ? Benign essential hypertension 07/08/2011  ? Family history of breast cancer in sister 07/08/2011  ? Fibrocystic breast changes  07/08/2011  ? ? ?History obtained from: chart review and patient. ? ?Laurie Bowman is a 81 y.o. female presenting for a follow up visit.  She was last seen in September 2022 by myself.  At that time, lung testing looked great.  We did not make any medication changes.  We continue with Singulair  10 mg daily as well as Flovent 110 mcg 2 puffs once daily.  For her allergic rhinitis, she continue with Nasonex as well as Allegra, Singulair, and Zaditor.  GERD was controlled with Prevacid and dietary restrictions. ? ?Since last visit, she had some issues 3 weeks ago. She was having bronchitis and asthma issues. She was having issues with coughing. Her husband was also diagnosed with bronchitis and he was in the hospital. She apparently was given a round of doxycycline and this improved her symptoms very nicely.  ? ?The day before her husband was discharged from the hospital, she went to see her PCP because she wanted to make sure that her chest was clear. It was clear but apparently there was some wheezing and she received a dose prednisone for 6 days. She did call her to get her albuterol all refilled.  ? ?She is no longer on Flovent at all. She called in for a refill and her refill was $50. Now she has two of these at home and is using them intermittently. She has not been on prednisone and has not been to the hospital for her symptoms at all.  ? ?Otherwise, there have been no changes to her past medical history, surgical history, family history, or social history. ? ? ? ?Review of Systems  ?Constitutional: Negative.  Negative for chills, fever, malaise/fatigue and weight loss.  ?HENT: Negative.  Negative for congestion, ear discharge and ear pain.   ?Eyes:  Negative for pain, discharge and redness.  ?Respiratory:  Negative for cough, sputum production, shortness of breath and wheezing.   ?Cardiovascular: Negative.  Negative for chest pain and palpitations.  ?Gastrointestinal:  Negative for abdominal pain, constipation, diarrhea, heartburn, nausea and vomiting.  ?Skin: Negative.  Negative for itching and rash.  ?Neurological:  Negative for dizziness and headaches.  ?Endo/Heme/Allergies:  Negative for environmental allergies. Does not bruise/bleed easily.   ? ? ? ?Objective:  ? ?Blood pressure 140/90, pulse 75,  temperature 97.6 ?F (36.4 ?C), temperature source Temporal, resp. rate 16, SpO2 96 %. ?There is no height or weight on file to calculate BMI. ? ? ? ?Physical Exam ?Vitals reviewed.  ?Constitutional:   ?   Appearance: She is well-developed.  ?   Interventions: She is not intubated. ?HENT:  ?   Head: Normocephalic and atraumatic.  ?   Right Ear: Tympanic membrane, ear canal and external ear normal.  ?   Left Ear: Tympanic membrane, ear canal and external ear normal.  ?   Nose: No nasal deformity, septal deviation, mucosal edema or rhinorrhea.  ?   Right Turbinates: Enlarged, swollen and pale.  ?   Left Turbinates: Enlarged, swollen and pale.  ?   Right Sinus: No maxillary sinus tenderness or frontal sinus tenderness.  ?   Left Sinus: No maxillary sinus tenderness or frontal sinus tenderness.  ?   Mouth/Throat:  ?   Mouth: Mucous membranes are not pale and not dry.  ?   Pharynx: Uvula midline.  ?Eyes:  ?   General: Lids are normal. No allergic shiner.    ?   Right eye: No discharge.     ?  Left eye: No discharge.  ?   Conjunctiva/sclera: Conjunctivae normal.  ?   Right eye: Right conjunctiva is not injected. No chemosis. ?   Left eye: Left conjunctiva is not injected. No chemosis. ?   Pupils: Pupils are equal, round, and reactive to light.  ?Cardiovascular:  ?   Rate and Rhythm: Normal rate and regular rhythm.  ?   Heart sounds: Normal heart sounds.  ?Pulmonary:  ?   Effort: Pulmonary effort is normal. No tachypnea, accessory muscle usage or respiratory distress. She is not intubated.  ?   Breath sounds: Normal breath sounds. No wheezing, rhonchi or rales.  ?   Comments: Moving air well in all lung fields. ?Chest:  ?   Chest wall: No tenderness.  ?Lymphadenopathy:  ?   Cervical: No cervical adenopathy.  ?Skin: ?   Coloration: Skin is not pale.  ?   Findings: No abrasion, erythema, petechiae or rash. Rash is not papular, urticarial or vesicular.  ?Neurological:  ?   Mental Status: She is alert.  ?Psychiatric:     ?    Behavior: Behavior is cooperative.  ?  ? ?Diagnostic studies: none ? ? ? ? ? ?  ?Salvatore Marvel, MD  ?[facility] ? ? ? ? ? ? ?

## 2021-07-10 ENCOUNTER — Encounter: Payer: Self-pay | Admitting: Allergy & Immunology

## 2021-07-10 MED ORDER — MONTELUKAST SODIUM 10 MG PO TABS: ORAL_TABLET | ORAL | 5 refills | Status: DC

## 2021-07-10 MED ORDER — LANSOPRAZOLE 30 MG PO CPDR: 30.0000 mg | DELAYED_RELEASE_CAPSULE | Freq: Two times a day (BID) | ORAL | 5 refills | Status: AC

## 2021-07-10 MED ORDER — FLUTICASONE PROPIONATE HFA 110 MCG/ACT IN AERO: 2.0000 | INHALATION_SPRAY | Freq: Two times a day (BID) | RESPIRATORY_TRACT | 5 refills | Status: DC

## 2021-07-18 NOTE — Progress Notes (Deleted)
Cardiology Office Note:    Date:  07/18/2021   ID:  Laurie Bowman, DOB [birth year], MRN [id number]  PCP:  Derrill Center., MD  Cardiologist:  Shirlee More, MD    Referring MD: Derrill Center., MD    ASSESSMENT:    No diagnosis found. PLAN:    In order of problems listed above:  ***   Next appointment: ***   Medication Adjustments/Labs and Tests Ordered: Current medicines are reviewed at length with the patient today.  Concerns regarding medicines are outlined above.  No orders of the defined types were placed in this encounter.  No orders of the defined types were placed in this encounter.   No chief complaint on file.   History of Present Illness:    Laurie Bowman is a 81 y.o. 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  Last echocardiogram performed September 2020 showed moderate concentric LVH focal hypertrophy of the basilar septum 19 mmHg and a resting mean gradient 10 mmHg across the outflow track. Compliance with diet, lifestyle and medications: *** Past Medical History:  Diagnosis Date   Asthma    Cancer ([place])    Hypertension    Urticaria     Past Surgical History:  Procedure Laterality Date   ABDOMINAL ADHESION SURGERY     ADENOIDECTOMY     BREAST SURGERY     carpel tunnel     CESAREAN SECTION     KNEE SURGERY     LEG SURGERY     bone graft   TONSILLECTOMY     TONSILLECTOMY     VEIN SURGERY     Right Leg    Current Medications: No outpatient medications have been marked as taking for the 07/19/21 encounter (Appointment) with Richardo Priest, MD.     Allergies:   Cefaclor, Codeine, Fructose, Sulfa antibiotics, Meperidine, Elemental sulfur, and Other   Social History   Socioeconomic History   Marital status: Married    Spouse name: Not on file   Number of children: Not on file    Years of education: Not on file   Highest education level: Not on file  Occupational History   Not on file  Tobacco Use   Smoking status: Former    Types: Cigarettes   Smokeless tobacco: Never  Vaping Use   Vaping Use: Never used  Substance and Sexual Activity   Alcohol use: No    Alcohol/week: 0.0 standard drinks   Drug use: No   Sexual activity: Never  Other Topics Concern   Not on file  Social History Narrative   Not on file   Social Determinants of Health   Financial Resource Strain: Not on file  Food Insecurity: Not on file  Transportation Needs: Not on file  Physical Activity: Not on file  Stress: Not on file  Social Connections: Not on file     Family History: The patient's ***family history includes Alzheimer's disease in her sister; Breast cancer in her sister; Congestive Heart Failure in her mother; Dementia in her sister; Hyperlipidemia in her sister and sister; Hypertension in her mother; Stroke in her maternal grandmother. There is no history of Allergic rhinitis, Angioedema, Asthma, Eczema, Immunodeficiency, Urticaria, or Atopy. ROS:   Please see the history of present illness.    All other systems reviewed and are negative.  EKGs/Labs/Other Studies Reviewed:    The  following studies were reviewed today:  EKG:  EKG ordered today and personally reviewed.  The ekg ordered today demonstrates ***  Recent Labs: No results found for requested labs within last 8760 hours.  Recent Lipid Panel No results found for: CHOL, TRIG, HDL, CHOLHDL, VLDL, LDLCALC, LDLDIRECT  Physical Exam:    VS:  LMP  (LMP Unknown)     Wt Readings from Last 3 Encounters:  12/05/20 146 lb 12.8 oz (66.6 kg)  06/15/20 151 lb 0.2 oz (68.5 kg)  04/18/20 149 lb 9.6 oz (67.9 kg)     GEN: *** Well nourished, well developed in no acute distress HEENT: Normal NECK: No JVD; No carotid bruits LYMPHATICS: No lymphadenopathy CARDIAC: ***RRR, no murmurs, rubs, gallops RESPIRATORY:  Clear  to auscultation without rales, wheezing or rhonchi  ABDOMEN: Soft, non-tender, non-distended MUSCULOSKELETAL:  No edema; No deformity  SKIN: Warm and dry NEUROLOGIC:  Alert and oriented x 3 PSYCHIATRIC:  Normal affect    Signed, Shirlee More, MD  07/18/2021 12:54 PM    [facility]

## 2021-07-19 ENCOUNTER — Ambulatory Visit: Payer: Medicare PPO | Admitting: Cardiology

## 2021-07-19 ENCOUNTER — Telehealth: Payer: Self-pay

## 2021-07-19 ENCOUNTER — Other Ambulatory Visit: Payer: Self-pay | Admitting: Cardiology

## 2021-07-19 ENCOUNTER — Other Ambulatory Visit: Payer: Self-pay

## 2021-07-19 MED ORDER — VERAPAMIL HCL ER 180 MG PO TBCR: 180.0000 mg | EXTENDED_RELEASE_TABLET | Freq: Every day | ORAL | 0 refills | Status: DC

## 2021-07-19 NOTE — Telephone Encounter (Signed)
Refilled Verapamil '180mg'$  #35. Sent to pharmacy. ?

## 2021-07-19 NOTE — Telephone Encounter (Signed)
Patient is needing a refill on her Verapamil 180 mg. She only had enough to last her one more week. Thank you. [phone] ?

## 2021-08-08 ENCOUNTER — Other Ambulatory Visit: Payer: Self-pay

## 2021-08-15 NOTE — Progress Notes (Unsigned)
Cardiology Office Note:    Date:  08/16/2021   ID:  Laurie Bowman, DOB 02/02/1941, MRN [id number]  PCP:  Derrill Center., MD  Cardiologist:  Shirlee More, MD    Referring MD: Derrill Center., MD    ASSESSMENT:    1. Hypertrophic cardiomyopathy ([place])   2. Hypertensive heart disease without heart failure   3. LBBB (left bundle branch block)   4. PVC's (premature ventricular contractions)   5. Hyperlipidemia, unspecified hyperlipidemia type    PLAN:    In order of problems listed above:  She continues to do well with her hypertrophic cardiomyopathy asymptomatic physical exam does not suggest significant gradient we will continue verapamil and after discussion with the patient neither of [place] feel she needs to repeat her echocardiogram this calendar year. BP at target continue verapamil Stable EKG pattern Stable asymptomatic Continue her statin she has an appointment with her PCP for labs upcoming last lipid profile was favorable   Next appointment: 1 year   Medication Adjustments/Labs and Tests Ordered: Current medicines are reviewed at length with the patient today.  Concerns regarding medicines are outlined above.  No orders of the defined types were placed in this encounter.  No orders of the defined types were placed in this encounter.   Chief Complaint  Patient presents with   Follow-up    For hypertension and hypertrophic cardiomyopathy with resting LV outflow tract obstruction    History of Present Illness:    Laurie Bowman is a 81 y.o. female with a hx of obstructive hypertrophic cardiomyopathy  hypertension mitral regurgitation left bundle branch block frequent PVCs and hyperlipidemia  last seen 06/15/2020. Her echocardiogram 11/25/2018 showed moderate concentric LVH and focal hypertrophy of the basilar septum 19 mm and a mean gradient of 10 mmHg across the outflow tract.   Compliance with diet, lifestyle and medications: Yes  Multiple medical issues she has had  falls fractured wrist ED visit with abdominal complaints but from cardiology perspective she is doing well No palpitation shortness of breath chest pain or syncope Blood pressure is at target and she tolerates her calcium channel blocker without any recurrent hypotension Past Medical History:  Diagnosis Date   Abnormal mammogram 08/16/2013   Acute sinusitis 06/10/2016   Allergic rhinitis 09/28/2015   Alopecia 12/16/2013   Aortic atherosclerosis ([place]) 09/27/2020   Asthma    Asthma with acute exacerbation 06/10/2016   Benign essential hypertension 07/08/2011   Benign neoplasm of colon 12/16/2013   Cancer ([place])    Carpal tunnel syndrome 12/16/2013   Constipation 12/16/2013   Cough 10/15/2016   CRF (chronic renal failure), stage 2 (mild) 05/09/2019   Dermatochalasis of both upper eyelids 04/14/2015   Dizziness 12/16/2013   Elevated WBC count 09/27/2020   Encounter for long-term (current) use of high-risk medication 06/20/2015   Family history of breast cancer in sister 07/08/2011   Fatty (change of) liver, not elsewhere classified 12/16/2013   Overview:  Overview:  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 Possibly aggravated by Tamoxifen   Fibrocystic breast changes 07/08/2011   Fracture, [place], left, closed 05/03/2021   GERD (gastroesophageal reflux disease) 02/06/2016   Heart murmur, systolic 63/10/4663   Overview:  Overview:  Mild MR/TR, echo 2007 without change since 2001   Hyperlipidemia LDL goal <70 12/16/2013   Hyperlipidemia, unspecified 12/16/2013   Hypertension    Hypertensive heart disease 04/30/2018   Hypertrophic cardiomyopathy ([place]) 04/30/2018  Hypokalemia 09/27/2020   Infection due to Staphylococcus epidermidis 09/27/2020   Irritable bowel syndrome 12/16/2013   LBBB (left bundle branch block) 2/42/3536   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   Major depressive disorder, recurrent, unspecified  ([place]) 12/16/2013   Mitral regurgitation 04/30/2018   Moderate persistent asthma 09/28/2015   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 06/21/2015   Other chest pain 09/27/2020   Other specified disorders of eyelid 11/09/2015   Pain in unspecified knee 12/16/2013   Peripheral visual field defect of both eyes 04/21/2015   PVC's (premature ventricular contractions) 04/30/2018   Rotator cuff tendonitis 12/16/2013   Undiagnosed cardiac murmurs 12/16/2013   Overview:  Mild MR/TR, echo 2007 without change since 2001   Urticaria    Ventricular premature depolarization 12/16/2013   Wound dehiscence 05/08/2015    Past Surgical History:  Procedure Laterality Date   ABDOMINAL ADHESION SURGERY     ADENOIDECTOMY     BREAST SURGERY     carpel tunnel     CESAREAN SECTION     KNEE SURGERY     LEG SURGERY     bone graft   TONSILLECTOMY     VEIN SURGERY     Right Leg    Current Medications: Current Meds  Medication Sig   albuterol (PROAIR HFA) 108 (90 Base) MCG/ACT inhaler Use 1-2 puffs every 4-6 hours as needed for cough or wheeze.   albuterol (PROVENTIL) (2.5 MG/3ML) 0.083% nebulizer solution Take 3 mLs (2.5 mg total) by nebulization every 6 (six) hours as needed for wheezing or shortness of breath.   aspirin 81 MG chewable tablet Chew 81 mg by mouth daily.   augmented betamethasone dipropionate (DIPROLENE-AF) 0.05 % cream Apply 1.44 application topically daily.   benzonatate (TESSALON) 100 MG capsule Take 100 mg by mouth daily as needed for cough.   Biotin 1000 MCG tablet Take 1,000 mcg by mouth daily.    buPROPion (WELLBUTRIN SR) 150 MG 12 hr tablet Take 150 mg by mouth daily.    calcium carbonate (OS-CAL) 600 MG TABS tablet Take 1 tablet by mouth 2 (two) times daily.    calcium-vitamin D (OSCAL WITH D) 500-200 MG-UNIT TABS tablet Take 1 tablet by mouth daily.   Cholecalciferol (VITAMIN D) 50 MCG (2000 UT) CAPS Take  2,000 Units by mouth daily.   clobetasol cream (TEMOVATE) 3.15 % Apply 1 application. topically daily.   clotrimazole-betamethasone (LOTRISONE) cream Apply 1 application. topically 2 (two) times daily.   diclofenac Sodium (VOLTAREN) 1 % GEL Apply 1 application. topically as needed for pain.   dicyclomine (BENTYL) 20 MG tablet Take 20 mg by mouth as needed for spasms.   fexofenadine (ALLEGRA) 180 MG tablet Take 180 mg by mouth daily.    fluocinonide cream (LIDEX) 4.00 % Apply 1 application topically 2 (two) times daily.    fluticasone (FLOVENT HFA) 110 MCG/ACT inhaler Inhale 2 puffs into the lungs 2 (two) times daily. Use with spacer.   hydrochlorothiazide (HYDRODIURIL) 12.5 MG tablet Take 12.5 mg by mouth daily.   lansoprazole (PREVACID) 30 MG capsule Take 1 capsule (30 mg total) by mouth 2 (two) times daily before a meal.   mometasone (NASONEX) 50 MCG/ACT nasal spray Place 2 sprays into the nose daily as needed.   montelukast (SINGULAIR) 10 MG tablet TAKE 1 TABLET BY MOUTH EVERYDAY AT BEDTIME   olopatadine (PATANOL) 0.1 % ophthalmic solution Place 1 drop  into both eyes 2 (two) times daily as needed.   Polyethylene Glycol 3350 (PEG 3350) POWD Take 17 g by mouth daily.    potassium chloride (KLOR-CON) 10 MEQ tablet Take 10 mEq by mouth 2 (two) times daily.   rosuvastatin (CRESTOR) 10 MG tablet Take 10 mg by mouth daily.   tiZANidine (ZANAFLEX) 4 MG tablet Take 4 mg by mouth as needed for muscle spasms.    traMADol (ULTRAM) 50 MG tablet Take 50 mg by mouth as needed for pain.   triamcinolone cream (KENALOG) 0.1 % Apply 1 application. topically 2 (two) times daily.   verapamil (CALAN-SR) 180 MG CR tablet Take 1 tablet (180 mg total) by mouth daily.     Allergies:   Cefaclor, Codeine, Fructose, Sulfa antibiotics, Meperidine, Elemental sulfur, and Other   Social History   Socioeconomic History   Marital status: Married    Spouse name: Not on file   Number of children: Not on file   Years of  education: Not on file   Highest education level: Not on file  Occupational History   Not on file  Tobacco Use   Smoking status: Former    Types: Cigarettes    Passive exposure: Past   Smokeless tobacco: Never  Vaping Use   Vaping Use: Never used  Substance and Sexual Activity   Alcohol use: No    Alcohol/week: 0.0 standard drinks   Drug use: No   Sexual activity: Never  Other Topics Concern   Not on file  Social History Narrative   Not on file   Social Determinants of Health   Financial Resource Strain: Not on file  Food Insecurity: Not on file  Transportation Needs: Not on file  Physical Activity: Not on file  Stress: Not on file  Social Connections: Not on file     Family History: The patient's family history includes Alzheimer's disease in her sister; Breast cancer in her sister; Congestive Heart Failure in her mother; Dementia in her sister; Hyperlipidemia in her sister and sister; Hypertension in her mother; Stroke in her maternal grandmother. There is no history of Allergic rhinitis, Angioedema, Asthma, Eczema, Immunodeficiency, Urticaria, or Atopy. ROS:   Please see the history of present illness.    All other systems reviewed and are negative.  EKGs/Labs/Other Studies Reviewed:    The following studies were reviewed today:  EKG:  EKG ordered today and personally reviewed.  The ekg ordered today demonstrates sinus rhythm left bundle branch block occasional PVCs  Recent Labs: 05/21/2021: Magnesium 2.3 05/09/2021 cholesterol 146 triglyceride 84 LDL 82 Sodium 136 potassium 4.9 creatinine 1.11 GFR 51 cc 09/19/2020 hemoglobin 13.4 platelets 329,000 Physical Exam:    VS:  BP 122/76   Pulse 70   Ht 5' (1.524 m)   Wt 149 lb 0.6 oz (67.6 kg)   LMP  (LMP Unknown)   SpO2 98%   BMI 29.11 kg/m     Wt Readings from Last 3 Encounters:  08/16/21 149 lb 0.6 oz (67.6 kg)  12/05/20 146 lb 12.8 oz (66.6 kg)  06/15/20 151 lb 0.2 oz (68.5 kg)     GEN: Appears her  age well nourished, well developed in no acute distress HEENT: Normal NECK: No JVD; No carotid bruits LYMPHATICS: No lymphadenopathy CARDIAC: 1/6 ejection murmur aortic area  RRR, no murmurs, rubs, gallops RESPIRATORY:  Clear to auscultation without rales, wheezing or rhonchi  ABDOMEN: Soft, non-tender, non-distended MUSCULOSKELETAL:  No edema; No deformity  SKIN: Warm and dry NEUROLOGIC:  Alert and oriented x 3 PSYCHIATRIC:  Normal affect    Signed, Shirlee More, MD  08/16/2021 8:41 AM    [place]

## 2021-08-16 ENCOUNTER — Encounter: Payer: Self-pay | Admitting: Cardiology

## 2021-08-16 ENCOUNTER — Ambulatory Visit: Payer: Medicare PPO | Admitting: Cardiology

## 2021-08-16 VITALS — BP 122/76 | HR 70 | Ht 60.0 in | Wt 149.0 lb

## 2021-08-16 DIAGNOSIS — I493 Ventricular premature depolarization: Secondary | ICD-10-CM | POA: Diagnosis not present

## 2021-08-16 DIAGNOSIS — I447 Left bundle-branch block, unspecified: Secondary | ICD-10-CM

## 2021-08-16 DIAGNOSIS — I119 Hypertensive heart disease without heart failure: Secondary | ICD-10-CM

## 2021-08-16 DIAGNOSIS — E785 Hyperlipidemia, unspecified: Secondary | ICD-10-CM

## 2021-08-16 DIAGNOSIS — I422 Other hypertrophic cardiomyopathy: Secondary | ICD-10-CM | POA: Diagnosis not present

## 2021-08-16 NOTE — Patient Instructions (Signed)

## 2021-08-29 ENCOUNTER — Inpatient Hospital Stay (HOSPITAL_COMMUNITY)
Admission: EM | Admit: 2021-08-29 | Discharge: 2021-09-03 | DRG: 558 | Disposition: A | Discharge status: Home Health | Payer: Medicare PPO | Attending: Internal Medicine | Admitting: Internal Medicine

## 2021-08-29 ENCOUNTER — Encounter (HOSPITAL_COMMUNITY): Payer: Self-pay | Admitting: Internal Medicine

## 2021-08-29 ENCOUNTER — Other Ambulatory Visit: Payer: Self-pay

## 2021-08-29 ENCOUNTER — Emergency Department (HOSPITAL_COMMUNITY): Payer: Medicare PPO

## 2021-08-29 DIAGNOSIS — I421 Obstructive hypertrophic cardiomyopathy: Secondary | ICD-10-CM | POA: Diagnosis present

## 2021-08-29 DIAGNOSIS — K219 Gastro-esophageal reflux disease without esophagitis: Secondary | ICD-10-CM | POA: Diagnosis present

## 2021-08-29 DIAGNOSIS — Z853 Personal history of malignant neoplasm of breast: Secondary | ICD-10-CM

## 2021-08-29 DIAGNOSIS — R531 Weakness: Secondary | ICD-10-CM | POA: Diagnosis not present

## 2021-08-29 DIAGNOSIS — N182 Chronic kidney disease, stage 2 (mild): Secondary | ICD-10-CM | POA: Diagnosis present

## 2021-08-29 DIAGNOSIS — M791 Myalgia, unspecified site: Secondary | ICD-10-CM

## 2021-08-29 DIAGNOSIS — M6282 Rhabdomyolysis: Secondary | ICD-10-CM | POA: Diagnosis not present

## 2021-08-29 DIAGNOSIS — F32A Depression, unspecified: Secondary | ICD-10-CM | POA: Diagnosis present

## 2021-08-29 DIAGNOSIS — K589 Irritable bowel syndrome without diarrhea: Secondary | ICD-10-CM | POA: Diagnosis present

## 2021-08-29 DIAGNOSIS — I452 Bifascicular block: Secondary | ICD-10-CM | POA: Diagnosis present

## 2021-08-29 DIAGNOSIS — M810 Age-related osteoporosis without current pathological fracture: Secondary | ICD-10-CM | POA: Diagnosis present

## 2021-08-29 DIAGNOSIS — E785 Hyperlipidemia, unspecified: Secondary | ICD-10-CM | POA: Diagnosis present

## 2021-08-29 DIAGNOSIS — Z7901 Long term (current) use of anticoagulants: Secondary | ICD-10-CM

## 2021-08-29 DIAGNOSIS — Z7982 Long term (current) use of aspirin: Secondary | ICD-10-CM

## 2021-08-29 DIAGNOSIS — E876 Hypokalemia: Secondary | ICD-10-CM | POA: Diagnosis present

## 2021-08-29 DIAGNOSIS — I480 Paroxysmal atrial fibrillation: Secondary | ICD-10-CM | POA: Diagnosis not present

## 2021-08-29 DIAGNOSIS — I1310 Hypertensive heart and chronic kidney disease without heart failure, with stage 1 through stage 4 chronic kidney disease, or unspecified chronic kidney disease: Secondary | ICD-10-CM | POA: Diagnosis present

## 2021-08-29 DIAGNOSIS — J4540 Moderate persistent asthma, uncomplicated: Secondary | ICD-10-CM | POA: Diagnosis present

## 2021-08-29 DIAGNOSIS — E860 Dehydration: Secondary | ICD-10-CM | POA: Diagnosis present

## 2021-08-29 DIAGNOSIS — F419 Anxiety disorder, unspecified: Secondary | ICD-10-CM | POA: Diagnosis present

## 2021-08-29 DIAGNOSIS — F339 Major depressive disorder, recurrent, unspecified: Secondary | ICD-10-CM | POA: Diagnosis present

## 2021-08-29 DIAGNOSIS — Z8249 Family history of ischemic heart disease and other diseases of the circulatory system: Secondary | ICD-10-CM

## 2021-08-29 DIAGNOSIS — Z87891 Personal history of nicotine dependence: Secondary | ICD-10-CM

## 2021-08-29 DIAGNOSIS — R7401 Elevation of levels of liver transaminase levels: Secondary | ICD-10-CM

## 2021-08-29 DIAGNOSIS — I10 Essential (primary) hypertension: Secondary | ICD-10-CM | POA: Diagnosis not present

## 2021-08-29 DIAGNOSIS — Z20822 Contact with and (suspected) exposure to covid-19: Secondary | ICD-10-CM | POA: Diagnosis present

## 2021-08-29 DIAGNOSIS — I447 Left bundle-branch block, unspecified: Secondary | ICD-10-CM | POA: Diagnosis present

## 2021-08-29 DIAGNOSIS — Z79899 Other long term (current) drug therapy: Secondary | ICD-10-CM

## 2021-08-29 DIAGNOSIS — Z823 Family history of stroke: Secondary | ICD-10-CM

## 2021-08-29 DIAGNOSIS — I422 Other hypertrophic cardiomyopathy: Secondary | ICD-10-CM

## 2021-08-29 DIAGNOSIS — Z803 Family history of malignant neoplasm of breast: Secondary | ICD-10-CM

## 2021-08-29 DIAGNOSIS — R778 Other specified abnormalities of plasma proteins: Secondary | ICD-10-CM | POA: Diagnosis not present

## 2021-08-29 DIAGNOSIS — I959 Hypotension, unspecified: Secondary | ICD-10-CM | POA: Diagnosis present

## 2021-08-29 DIAGNOSIS — I248 Other forms of acute ischemic heart disease: Secondary | ICD-10-CM | POA: Diagnosis present

## 2021-08-29 LAB — COMPREHENSIVE METABOLIC PANEL
ALT: 39 U/L (ref 0–44)
AST: 50 U/L — ABNORMAL HIGH (ref 15–41)
Albumin: 3.7 g/dL (ref 3.5–5.0)
Alkaline Phosphatase: 51 U/L (ref 38–126)
Anion gap: 9 (ref 5–15)
BUN: 11 mg/dL (ref 8–23)
CO2: 25 mmol/L (ref 22–32)
Calcium: 9.9 mg/dL (ref 8.9–10.3)
Chloride: 102 mmol/L (ref 98–111)
Creatinine, Ser: 1.03 mg/dL — ABNORMAL HIGH (ref 0.44–1.00)
GFR, Estimated: 55 mL/min — ABNORMAL LOW (ref >60)
Glucose, Bld: 103 mg/dL — ABNORMAL HIGH (ref 70–99)
Potassium: 3 mmol/L — ABNORMAL LOW (ref 3.5–5.1)
Sodium: 136 mmol/L (ref 135–145)
Total Bilirubin: 1.4 mg/dL — ABNORMAL HIGH (ref 0.3–1.2)
Total Protein: 6.6 g/dL (ref 6.5–8.1)

## 2021-08-29 LAB — URINALYSIS, ROUTINE W REFLEX MICROSCOPIC
Bilirubin Urine: NEGATIVE
Glucose, UA: NEGATIVE mg/dL
Hgb urine dipstick: NEGATIVE
Ketones, ur: NEGATIVE mg/dL
Leukocytes,Ua: NEGATIVE
Nitrite: NEGATIVE
Protein, ur: NEGATIVE mg/dL
Specific Gravity, Urine: 1.02 (ref 1.005–1.030)
pH: 7 (ref 5.0–8.0)

## 2021-08-29 LAB — CBC WITH DIFFERENTIAL/PLATELET
Abs Immature Granulocytes: 0.05 10*3/uL (ref 0.00–0.07)
Basophils Absolute: 0 10*3/uL (ref 0.0–0.1)
Basophils Relative: 0 %
Eosinophils Absolute: 0 10*3/uL (ref 0.0–0.5)
Eosinophils Relative: 0 %
HCT: 39.6 % (ref 36.0–46.0)
Hemoglobin: 13.8 g/dL (ref 12.0–15.0)
Immature Granulocytes: 1 %
Lymphocytes Relative: 2 %
Lymphs Abs: 0.2 10*3/uL — ABNORMAL LOW (ref 0.7–4.0)
MCH: 33 pg (ref 26.0–34.0)
MCHC: 34.8 g/dL (ref 30.0–36.0)
MCV: 94.7 fL (ref 80.0–100.0)
Monocytes Absolute: 0.3 10*3/uL (ref 0.1–1.0)
Monocytes Relative: 3 %
Neutro Abs: 8.8 10*3/uL — ABNORMAL HIGH (ref 1.7–7.7)
Neutrophils Relative %: 94 %
Platelets: 198 10*3/uL (ref 150–400)
RBC: 4.18 MIL/uL (ref 3.87–5.11)
RDW: 12.6 % (ref 11.5–15.5)
WBC: 9.4 10*3/uL (ref 4.0–10.5)
nRBC: 0 % (ref 0.0–0.2)

## 2021-08-29 LAB — TROPONIN I (HIGH SENSITIVITY)
Troponin I (High Sensitivity): 33 ng/L — ABNORMAL HIGH (ref <18)
Troponin I (High Sensitivity): 36 ng/L — ABNORMAL HIGH (ref <18)

## 2021-08-29 LAB — SARS CORONAVIRUS 2 BY RT PCR: SARS Coronavirus 2 by RT PCR: NEGATIVE

## 2021-08-29 LAB — CK: Total CK: 151 U/L (ref 38–234)

## 2021-08-29 MED ORDER — ENOXAPARIN SODIUM 40 MG/0.4ML IJ SOSY
40.0000 mg | PREFILLED_SYRINGE | INTRAMUSCULAR | Status: DC
  Administered 2021-08-30: 40 mg via SUBCUTANEOUS
  Filled 2021-08-29: qty 0.4

## 2021-08-29 MED ORDER — BUDESONIDE 0.25 MG/2ML IN SUSP
0.2500 mg | Freq: Two times a day (BID) | RESPIRATORY_TRACT | Status: DC
  Administered 2021-08-30 – 2021-09-03 (×10): 0.25 mg via RESPIRATORY_TRACT
  Filled 2021-08-29 (×10): qty 2

## 2021-08-29 MED ORDER — POLYETHYLENE GLYCOL 3350 17 G PO PACK
17.0000 g | PACK | Freq: Every day | ORAL | Status: DC | PRN
Start: 2021-08-29 — End: 2021-09-03

## 2021-08-29 MED ORDER — ONDANSETRON HCL 4 MG/2ML IJ SOLN
4.0000 mg | Freq: Once | INTRAMUSCULAR | Status: AC
  Administered 2021-08-29: 4 mg via INTRAVENOUS
  Filled 2021-08-29: qty 2

## 2021-08-29 MED ORDER — DICYCLOMINE HCL 20 MG PO TABS: 20.0000 mg | ORAL_TABLET | ORAL | Status: DC | PRN

## 2021-08-29 MED ORDER — FLUTICASONE PROPIONATE HFA 110 MCG/ACT IN AERO
2.0000 | INHALATION_SPRAY | Freq: Two times a day (BID) | RESPIRATORY_TRACT | Status: DC
Start: 2021-08-29 — End: 2021-08-29

## 2021-08-29 MED ORDER — POTASSIUM CHLORIDE CRYS ER 20 MEQ PO TBCR
40.0000 meq | EXTENDED_RELEASE_TABLET | Freq: Once | ORAL | Status: AC
  Administered 2021-08-29: 40 meq via ORAL
  Filled 2021-08-29: qty 2

## 2021-08-29 MED ORDER — ROSUVASTATIN CALCIUM 5 MG PO TABS: 10.0000 mg | ORAL_TABLET | Freq: Every day | ORAL | Status: DC

## 2021-08-29 MED ORDER — OYSTER SHELL CALCIUM/D 500-200 MG-UNIT PO TABS: 1.0000 | ORAL_TABLET | Freq: Every day | ORAL | Status: DC

## 2021-08-29 MED ORDER — SODIUM CHLORIDE 0.9 % IV SOLN: INTRAVENOUS | Status: DC

## 2021-08-29 MED ORDER — ASPIRIN 81 MG PO CHEW
81.0000 mg | CHEWABLE_TABLET | Freq: Every day | ORAL | Status: DC
  Administered 2021-08-30 – 2021-09-02 (×4): 81 mg via ORAL
  Filled 2021-08-29 (×4): qty 1

## 2021-08-29 MED ORDER — ACETAMINOPHEN 325 MG PO TABS
650.0000 mg | ORAL_TABLET | Freq: Four times a day (QID) | ORAL | Status: DC | PRN
  Administered 2021-08-30 – 2021-09-03 (×4): 650 mg via ORAL
  Filled 2021-08-29 (×4): qty 2

## 2021-08-29 MED ORDER — ACETAMINOPHEN 325 MG PO TABS
650.0000 mg | ORAL_TABLET | Freq: Once | ORAL | Status: AC
Start: 2021-08-29 — End: 2021-08-29
  Administered 2021-08-29: 650 mg via ORAL
  Filled 2021-08-29: qty 2

## 2021-08-29 MED ORDER — ACETAMINOPHEN 650 MG RE SUPP: 650.0000 mg | Freq: Four times a day (QID) | RECTAL | Status: DC | PRN

## 2021-08-29 MED ORDER — PANTOPRAZOLE SODIUM 40 MG PO TBEC
40.0000 mg | DELAYED_RELEASE_TABLET | Freq: Every day | ORAL | Status: DC
  Administered 2021-08-30 – 2021-09-03 (×5): 40 mg via ORAL
  Filled 2021-08-29 (×5): qty 1

## 2021-08-29 MED ORDER — SODIUM CHLORIDE 0.9 % IV BOLUS
1000.0000 mL | Freq: Once | INTRAVENOUS | Status: AC
  Administered 2021-08-29: 1000 mL via INTRAVENOUS

## 2021-08-29 MED ORDER — ALBUTEROL SULFATE (2.5 MG/3ML) 0.083% IN NEBU: INHALATION_SOLUTION | RESPIRATORY_TRACT | Status: DC | PRN

## 2021-08-29 MED ORDER — POTASSIUM CHLORIDE 10 MEQ/100ML IV SOLN
10.0000 meq | Freq: Once | INTRAVENOUS | Status: AC
  Administered 2021-08-29: 10 meq via INTRAVENOUS
  Filled 2021-08-29: qty 100

## 2021-08-29 MED ORDER — KETOROLAC TROMETHAMINE 30 MG/ML IJ SOLN
15.0000 mg | Freq: Once | INTRAMUSCULAR | Status: AC
  Administered 2021-08-29: 15 mg via INTRAVENOUS
  Filled 2021-08-29: qty 1

## 2021-08-29 MED ORDER — SODIUM CHLORIDE 0.9% FLUSH
3.0000 mL | Freq: Two times a day (BID) | INTRAVENOUS | Status: DC
  Administered 2021-08-30 – 2021-09-03 (×9): 3 mL via INTRAVENOUS

## 2021-08-29 MED ORDER — OYSTER SHELL CALCIUM/D3 500-5 MG-MCG PO TABS
1.0000 | ORAL_TABLET | Freq: Every day | ORAL | Status: DC
  Administered 2021-08-30 – 2021-09-03 (×5): 1 via ORAL
  Filled 2021-08-29 (×5): qty 1

## 2021-08-29 NOTE — H&P (Addendum)
History and Physical   Laurie Bowman HYI:[id number] DOB: [birth year] DOA: 08/29/2021  PCP: Derrill Center., MD   Patient coming from: Home  Chief Complaint: Body aches, weakness  HPI: Laurie Bowman is a 81 y.o. female with medical history significant of osteoporosis, asthma, GERD, hypertension, CKD 2, alopecia, hyperlipidemia, IBS, depression, breast cancer presenting with body aches and weakness.  Patient has had 1 to 2 days of body aches and weakness.  She had infusion for her osteoporosis with Reclast, yesterday.  Several hours after the infusion she began to feel some weakness and chills and she has tried Tylenol for this body aches with no help. She fell getting out of bed this morning and was wedged between the bed and the nightstand.  EMS was called and transported her here they did note a right bundle branch block on her EKG.  She reports continued significant weakness and concern for safety of going home.  She denies fevers, chills, chest pain, shortness of breath, abdominal pain, constipation, diarrhea, nausea, vomiting.  ED Course: Vital signs in the ED stable.  Lab work-up showed BMP with potassium of 3.0 and creatinine of 1.03 with no prior to compare but appears to be near baseline with history of CKD, glucose 103.  CBC within normal limits.  CK normal.  Troponin mildly elevated at 33 and then 36 on repeat.  COVID screen negative.  Urinalysis pending.  Chest x-ray with cardiomegaly with similar bilateral chronic interstitial changes.  Patient received Tylenol, Toradol, Zofran, 50 mEq potassium and 1 L of fluids in the ED.  Review of Systems: As per HPI otherwise all other systems reviewed and are negative.  Past Medical History:  Diagnosis Date   Abnormal mammogram 08/16/2013   Acute sinusitis 06/10/2016   Allergic rhinitis 09/28/2015   Alopecia 12/16/2013   Aortic atherosclerosis ([place]) 09/27/2020   Asthma    Asthma with acute exacerbation 06/10/2016   Benign essential hypertension  07/08/2011   Benign neoplasm of colon 12/16/2013   Cancer ([place])    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/30/2018   Hypokalemia 09/27/2020   Infection due to Staphylococcus epidermidis 09/27/2020   Irritable bowel syndrome 12/16/2013   LBBB (left bundle branch block) 0/94/7096   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   Major depressive disorder, recurrent, unspecified ([place]) 12/16/2013   Mitral regurgitation 04/30/2018   Moderate persistent asthma 09/28/2015   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 06/21/2015   Other chest pain 09/27/2020   Other specified disorders of eyelid 11/09/2015   Pain in unspecified knee 12/16/2013   Peripheral visual field defect of both eyes 04/21/2015   PVC's (premature ventricular contractions) 04/30/2018   Rotator cuff tendonitis  12/16/2013   Undiagnosed cardiac murmurs 12/16/2013   Overview:  Mild MR/TR, echo 2007 without change since 2001   Urticaria    Ventricular premature depolarization 12/16/2013   Wound dehiscence 05/08/2015    Past Surgical History:  Procedure Laterality Date   ABDOMINAL ADHESION SURGERY     ADENOIDECTOMY     BREAST SURGERY     carpel tunnel     CESAREAN SECTION     KNEE SURGERY     LEG SURGERY     bone graft   TONSILLECTOMY     VEIN SURGERY     Right Leg    Social History  reports that she has quit smoking. Her smoking use included cigarettes. She has been exposed to tobacco smoke. She has never used smokeless tobacco. She reports that she does not drink alcohol and does not use drugs.  Allergies  Allergen Reactions   Cefaclor Itching    Other reaction(s): Flushing (ALLERGY/intolerance)   Codeine Itching and Nausea And Vomiting   Fructose Swelling    Swelling and GI upset, Fructose Granules    Sulfa Antibiotics Swelling   Meperidine Nausea And Vomiting and Itching   Elemental Sulfur Swelling   Other Nausea And Vomiting    Family History  Problem Relation Age of Onset   Congestive Heart Failure Mother    Hypertension Mother    Hyperlipidemia Sister    Dementia Sister    Alzheimer's disease Sister    Stroke Maternal Grandmother    Breast cancer Sister    Hyperlipidemia Sister    Allergic rhinitis Neg Hx    Angioedema Neg Hx    Asthma Neg Hx    Eczema Neg Hx    Immunodeficiency Neg Hx    Urticaria Neg Hx    Atopy Neg Hx   Reviewed on admission  Prior to Admission medications   Medication Sig Start Date End Date Taking? Authorizing Provider  albuterol (PROAIR HFA) 108 (90 Base) MCG/ACT inhaler Use 1-2 puffs every 4-6 hours as needed for cough or wheeze. 12/05/20   Valentina Shaggy, MD  albuterol (PROVENTIL) (2.5 MG/3ML) 0.083% nebulizer solution Take 3 mLs (2.5 mg total) by nebulization every 6 (six) hours as needed for wheezing or shortness of breath. 06/18/21    Valentina Shaggy, MD  aspirin 81 MG chewable tablet Chew 81 mg by mouth daily.    [provider]  augmented betamethasone dipropionate (DIPROLENE-AF) 0.05 % cream Apply 5.78 application topically daily.    [provider]  benzonatate (TESSALON) 100 MG capsule Take 100 mg by mouth daily as needed for cough. 07/02/18   [provider]  Biotin 1000 MCG tablet Take 1,000 mcg by mouth daily.     [provider]  buPROPion (WELLBUTRIN SR) 150 MG 12 hr tablet Take 150 mg by mouth daily.     [provider]  calcium carbonate (OS-CAL) 600 MG TABS tablet Take 1 tablet by mouth 2 (two) times daily.     [provider]  calcium-vitamin D (OSCAL WITH D) 500-200 MG-UNIT TABS tablet Take 1 tablet by mouth daily.    [provider]  Cholecalciferol (VITAMIN D) 50 MCG (2000 UT) CAPS Take 2,000  Units by mouth daily.    [provider]  clobetasol cream (TEMOVATE) 1.44 % Apply 1 application. topically daily. 03/22/18   [provider]  clotrimazole-betamethasone (LOTRISONE) cream Apply 1 application. topically 2 (two) times daily. 03/16/19   [provider]  diclofenac Sodium (VOLTAREN) 1 % GEL Apply 1 application. topically as needed for pain. 04/16/19   [provider]  dicyclomine (BENTYL) 20 MG tablet Take 20 mg by mouth as needed for spasms. 04/14/18   [provider]  fexofenadine (ALLEGRA) 180 MG tablet Take 180 mg by mouth daily.     [provider]  fluocinonide cream (LIDEX) 8.18 % Apply 1 application topically 2 (two) times daily.     [provider]  fluticasone (FLOVENT HFA) 110 MCG/ACT inhaler Inhale 2 puffs into the lungs 2 (two) times daily. Use with spacer. 07/10/21   Valentina Shaggy, MD  hydrochlorothiazide (HYDRODIURIL) 12.5 MG tablet Take 12.5 mg by mouth daily. 08/14/21   [provider]  lansoprazole (PREVACID) 30 MG capsule Take 1 capsule (30 mg total) by  mouth 2 (two) times daily before a meal. 07/10/21   Valentina Shaggy, MD  mometasone (NASONEX) 50 MCG/ACT nasal spray Place 2 sprays into the nose daily as needed. 05/10/19   Bobbitt, [place] Muta, MD  montelukast (SINGULAIR) 10 MG tablet TAKE 1 TABLET BY MOUTH EVERYDAY AT BEDTIME 07/10/21   Valentina Shaggy, MD  olopatadine (PATANOL) 0.1 % ophthalmic solution Place 1 drop into both eyes 2 (two) times daily as needed. 04/18/20   Valentina Shaggy, MD  Polyethylene Glycol 3350 (PEG 3350) POWD Take 17 g by mouth daily.  06/24/16   [provider]  potassium chloride (KLOR-CON) 10 MEQ tablet Take 10 mEq by mouth 2 (two) times daily. 04/21/15   [provider]  rosuvastatin (CRESTOR) 10 MG tablet Take 10 mg by mouth daily. 07/13/21   [provider]  tiZANidine (ZANAFLEX) 4 MG tablet Take 4 mg by mouth as needed for muscle spasms.  11/16/19   [provider]  traMADol (ULTRAM) 50 MG tablet Take 50 mg by mouth as needed for pain. 05/17/21   [provider]  triamcinolone cream (KENALOG) 0.1 % Apply 1 application. topically 2 (two) times daily. 12/14/18   [provider]  verapamil (CALAN-SR) 180 MG CR tablet Take 1 tablet (180 mg total) by mouth daily. 07/19/21   Richardo Priest, MD    Physical Exam: Vitals:   08/29/21 1427 08/29/21 1432 08/29/21 1500 08/29/21 1730  BP:  125/72 111/72 114/64  Pulse:  82 84 70  Resp:  '12 15 19  '$ Temp:  98.9 F (37.2 C)    TempSrc:  Oral    SpO2:  97% 97% 95%  Weight: 66.2 kg     Height: 5' (1.524 m)       Physical Exam Constitutional:      General: She is not in acute distress.    Appearance: Normal appearance.  HENT:     Head: Normocephalic and atraumatic.     Mouth/Throat:     Mouth: Mucous membranes are moist.     Pharynx: Oropharynx is clear.  Eyes:     Extraocular Movements: Extraocular movements intact.     Pupils: Pupils are equal, round, and reactive to light.  Cardiovascular:     Rate and  Rhythm: Normal rate and regular rhythm.     Pulses: Normal pulses.     Heart sounds: Murmur heard.  Pulmonary:  Effort: Pulmonary effort is normal. No respiratory distress.     Breath sounds: Normal breath sounds.  Abdominal:     General: Bowel sounds are normal. There is no distension.     Palpations: Abdomen is soft.     Tenderness: There is no abdominal tenderness.  Musculoskeletal:        General: Tenderness present. No swelling or deformity.  Skin:    General: Skin is warm and dry.  Neurological:     General: No focal deficit present.     Mental Status: Mental status is at baseline.    Labs on Admission: I have personally reviewed following labs and imaging studies  CBC: Recent Labs  Lab 08/29/21 1453  WBC 9.4  NEUTROABS 8.8*  HGB 13.8  HCT 39.6  MCV 94.7  PLT 573    Basic Metabolic Panel: Recent Labs  Lab 08/29/21 1453  NA 136  K 3.0*  CL 102  CO2 25  GLUCOSE 103*  BUN 11  CREATININE 1.03*  CALCIUM 9.9    GFR: Estimated Creatinine Clearance: 37.6 mL/min (A) (by C-G formula based on SCr of 1.03 mg/dL (H)).  Liver Function Tests: Recent Labs  Lab 08/29/21 1453  AST 50*  ALT 39  ALKPHOS 51  BILITOT 1.4*  PROT 6.6  ALBUMIN 3.7    Urine analysis:    Component Value Date/Time   COLORURINE YELLOW 08/29/2021 1917   APPEARANCEUR CLEAR 08/29/2021 1917   LABSPEC 1.020 08/29/2021 1917   PHURINE 7.0 08/29/2021 1917   GLUCOSEU NEGATIVE 08/29/2021 1917   HGBUR NEGATIVE 08/29/2021 1917   BILIRUBINUR NEGATIVE 08/29/2021 [place] NEGATIVE 08/29/2021 1917   PROTEINUR NEGATIVE 08/29/2021 1917   NITRITE NEGATIVE 08/29/2021 1917   LEUKOCYTESUR NEGATIVE 08/29/2021 1917    Radiological Exams on Admission: DG Chest Portable 1 View  Result Date: 08/29/2021 CLINICAL DATA:  General weakness EXAM: PORTABLE CHEST 1 VIEW COMPARISON:  Radiograph 02/12/2021, chest CT 09/17/2020 FINDINGS: Unchanged enlarged cardiac silhouette. There is mild interstitial  prominence bilaterally, similar to prior exams. There is no focal airspace consolidation. There is no pleural effusion. No pneumothorax. No acute osseous abnormality. IMPRESSION: Cardiomegaly with similar chronic interstitial changes bilaterally. No new airspace disease. Electronically Signed   By: Maurine Simmering M.D.   On: 08/29/2021 14:57    EKG: Independently reviewed.  Sinus rhythm at 84 bpm.  Left bundle branch block.  Similar to previous, left bundle branch block present on previous.  Assessment/Plan Principal Problem:   Generalized weakness Active Problems:   Moderate persistent asthma   GERD (gastroesophageal reflux disease)   Benign essential hypertension   CRF (chronic renal failure), stage 2 (mild)   Hyperlipidemia, unspecified   Irritable bowel syndrome   Major depressive disorder, recurrent, unspecified (HCC)   Hypokalemia   Elevated troponin   Generalized weakness > Patient presented with generalized weakness and body aches in the setting of recent Reclast infusion.  Had a fall when getting out of bed this morning. > Suspect symptoms are largely secondary to Reclast therapy.  Patient remains significantly weak and given her age and comorbidities in addition to below issues and concern for safety going home we will monitor overnight. - Monitor overnight - Supportive care - PT and OT eval and treat  Elevated troponin > Mildly elevated troponin at 33 and then 36 on repeat.  No chest pain, no shortness of breath, left bundle branch block on EKG is old. -Monitor on telemetry - We will continue to trend troponin -  Consider echocardiogram if significantly uptrend in troponin or any chest pain or significant shortness of breath develop  Hypokalemia > Potassium noted to be 3.0 in the ED. > Appears to be somewhat of a chronic issue as she does have some potassium prescribed outpatient. > Could be worsened in the setting of Reclast infusion which has been associated with  hypokalemia. > Received 50 mEq potassium in the ED, 40 mEq p.o. and 10 mEq IV. - We will be on telemetry as above - Check magnesium - Trend electrolytes  CKD 2 > Creatinine failure stable at 1.03 - Trend renal function electrolytes  Asthma - Continue home fluticasone and as needed albuterol  GERD - Continue home PPI  Hypertension - Holding home HCTZ and verapamil in the setting of low normal blood pressure in the ED  Hyperlipidemia - Continue home rosuvastatin  IBS - Continue home Bentyl  Depression - Continue home bupropion  History of HOCM - Noted, rehydrating  DVT prophylaxis: Lovenox Code Status:   Full Family Communication:  Updated at bedside Disposition Plan:   Patient is from:  Home  Anticipated DC to:  Home  Anticipated DC date:  1 to 2 days  Anticipated DC barriers: None  Consults called:  None Admission status:  Observation, telemetry  Severity of Illness: The appropriate patient status for this patient is OBSERVATION. Observation status is judged to be reasonable and necessary in order to provide the required intensity of service to ensure the patient's safety. The patient's presenting symptoms, physical exam findings, and initial radiographic and laboratory data in the context of their medical condition is felt to place them at decreased risk for further clinical deterioration. Furthermore, it is anticipated that the patient will be medically stable for discharge from the hospital within 2 midnights of admission.    Marcelyn Bruins MD [facility]  How to contact the [facility] Attending or Consulting provider [place] or covering provider during after hours [place], for this patient?   Check the care team in [facility] and look for a) attending/consulting [facility] provider listed and b) the [facility] team listed Log into [email or web address] and use 's universal password to access. If you do not have the password, please contact the hospital operator. Locate the  [facility] provider you are looking for under [facility] and page to a number that you can be directly reached. If you still have difficulty reaching the provider, please page the [facility] (Director on Call) for the Hospitalists listed on amion for assistance.  08/29/2021, 7:35 PM

## 2021-08-29 NOTE — ED Notes (Signed)
Called lab and will add that troponin to already collected blood.

## 2021-08-29 NOTE — ED Notes (Signed)
Purewick placed on patient.

## 2021-08-29 NOTE — ED Provider Notes (Signed)
[facility] EMERGENCY DEPARTMENT Provider Note   CSN: [id number] Arrival date & time: 08/29/21  1421     History  Chief Complaint  Patient presents with   Weakness    Laurie Bowman is a 81 y.o. female.  Patient here with generalized weakness and body aches.  Patient had infusion for osteoporosis yesterday and several hours later started to have body aches and weakness and chills.  Took some Tylenol with no major improvement.  EMS evaluated the patient and brought him here for further work-up.  They were concerned because patient had bundle branch block.  Sounds like she got Reclast infusion.  This was the first time she is ever had that.  Patient has history of hypertension, left bundle branch block, hypertrophic cardiomyopathy, asthma, IBS, acid reflux, osteoporosis.  She denies any chest pain, shortness of breath and overall complains of body aches throughout.  Denies having fever but does feel achy and chills.  The history is provided by the patient.       Home Medications Prior to Admission medications   Medication Sig Start Date End Date Taking? Authorizing Provider  albuterol (PROAIR HFA) 108 (90 Base) MCG/ACT inhaler Use 1-2 puffs every 4-6 hours as needed for cough or wheeze. 12/05/20   Valentina Shaggy, MD  albuterol (PROVENTIL) (2.5 MG/3ML) 0.083% nebulizer solution Take 3 mLs (2.5 mg total) by nebulization every 6 (six) hours as needed for wheezing or shortness of breath. 06/18/21   Valentina Shaggy, MD  aspirin 81 MG chewable tablet Chew 81 mg by mouth daily.    [provider]  augmented betamethasone dipropionate (DIPROLENE-AF) 0.05 % cream Apply 4.09 application topically daily.    [provider]  benzonatate (TESSALON) 100 MG capsule Take 100 mg by mouth daily as needed for cough. 07/02/18   [provider]  Biotin 1000 MCG tablet Take 1,000 mcg by mouth daily.     [provider]  buPROPion (WELLBUTRIN SR)  150 MG 12 hr tablet Take 150 mg by mouth daily.     [provider]  calcium carbonate (OS-CAL) 600 MG TABS tablet Take 1 tablet by mouth 2 (two) times daily.     [provider]  calcium-vitamin D (OSCAL WITH D) 500-200 MG-UNIT TABS tablet Take 1 tablet by mouth daily.    [provider]  Cholecalciferol (VITAMIN D) 50 MCG (2000 UT) CAPS Take 2,000 Units by mouth daily.    [provider]  clobetasol cream (TEMOVATE) 8.11 % Apply 1 application. topically daily. 03/22/18   [provider]  clotrimazole-betamethasone (LOTRISONE) cream Apply 1 application. topically 2 (two) times daily. 03/16/19   [provider]  diclofenac Sodium (VOLTAREN) 1 % GEL Apply 1 application. topically as needed for pain. 04/16/19   [provider]  dicyclomine (BENTYL) 20 MG tablet Take 20 mg by mouth as needed for spasms. 04/14/18   [provider]  fexofenadine (ALLEGRA) 180 MG tablet Take 180 mg by mouth daily.     [provider]  fluocinonide cream (LIDEX) 9.14 % Apply 1 application topically 2 (two) times daily.     [provider]  fluticasone (FLOVENT HFA) 110 MCG/ACT inhaler Inhale 2 puffs into the lungs 2 (two) times daily. Use with spacer. 07/10/21   Valentina Shaggy, MD  hydrochlorothiazide (HYDRODIURIL) 12.5 MG tablet Take 12.5 mg by mouth daily. 08/14/21   [provider]  lansoprazole (PREVACID) 30 MG capsule Take 1 capsule (30 mg total) by  mouth 2 (two) times daily before a meal. 07/10/21   Valentina Shaggy, MD  mometasone (NASONEX) 50 MCG/ACT nasal spray Place 2 sprays into the nose daily as needed. 05/10/19   Bobbitt, [place] Muta, MD  montelukast (SINGULAIR) 10 MG tablet TAKE 1 TABLET BY MOUTH EVERYDAY AT BEDTIME 07/10/21   Valentina Shaggy, MD  olopatadine (PATANOL) 0.1 % ophthalmic solution Place 1 drop into both eyes 2 (two) times daily as needed. 04/18/20   Valentina Shaggy, MD  Polyethylene  Glycol 3350 (PEG 3350) POWD Take 17 g by mouth daily.  06/24/16   [provider]  potassium chloride (KLOR-CON) 10 MEQ tablet Take 10 mEq by mouth 2 (two) times daily. 04/21/15   [provider]  rosuvastatin (CRESTOR) 10 MG tablet Take 10 mg by mouth daily. 07/13/21   [provider]  tiZANidine (ZANAFLEX) 4 MG tablet Take 4 mg by mouth as needed for muscle spasms.  11/16/19   [provider]  traMADol (ULTRAM) 50 MG tablet Take 50 mg by mouth as needed for pain. 05/17/21   [provider]  triamcinolone cream (KENALOG) 0.1 % Apply 1 application. topically 2 (two) times daily. 12/14/18   [provider]  verapamil (CALAN-SR) 180 MG CR tablet Take 1 tablet (180 mg total) by mouth daily. 07/19/21   Richardo Priest, MD      Allergies    Cefaclor, Codeine, Fructose, Sulfa antibiotics, Meperidine, Elemental sulfur, and Other    Review of Systems   Review of Systems  Physical Exam Updated Vital Signs BP 114/64   Pulse 70   Temp 98.9 F (37.2 C) (Oral)   Resp 19   Ht 5' (1.524 m)   Wt 66.2 kg   LMP  (LMP Unknown)   SpO2 95%   BMI 28.51 kg/m  Physical Exam Vitals and nursing note reviewed.  Constitutional:      General: She is not in acute distress.    Appearance: She is well-developed. She is ill-appearing.  HENT:     Head: Normocephalic and atraumatic.     Nose: Nose normal.     Mouth/Throat:     Mouth: Mucous membranes are moist.  Eyes:     Conjunctiva/sclera: Conjunctivae normal.     Pupils: Pupils are equal, round, and reactive to light.  Cardiovascular:     Rate and Rhythm: Normal rate and regular rhythm.     Pulses: Normal pulses.     Heart sounds: Normal heart sounds. No murmur heard. Pulmonary:     Effort: Pulmonary effort is normal. No respiratory distress.     Breath sounds: Normal breath sounds.  Abdominal:     Palpations: Abdomen is soft.     Tenderness: There is no abdominal tenderness.  Musculoskeletal:         General: No swelling.     Cervical back: Neck supple.  Skin:    General: Skin is warm and dry.     Capillary Refill: Capillary refill takes less than 2 seconds.  Neurological:     General: No focal deficit present.     Mental Status: She is alert and oriented to person, place, and time.     Cranial Nerves: No cranial nerve deficit.     Sensory: No sensory deficit.     Motor: No weakness.     Coordination: Coordination normal.  Psychiatric:        Mood and Affect: Mood normal.     ED Results / Procedures /  Treatments   Labs (all labs ordered are listed, but only abnormal results are displayed) Labs Reviewed  CBC WITH DIFFERENTIAL/PLATELET - Abnormal; Notable for the following components:      Result Value   Neutro Abs 8.8 (*)    Lymphs Abs 0.2 (*)    All other components within normal limits  COMPREHENSIVE METABOLIC PANEL - Abnormal; Notable for the following components:   Potassium 3.0 (*)    Glucose, Bld 103 (*)    Creatinine, Ser 1.03 (*)    AST 50 (*)    Total Bilirubin 1.4 (*)    GFR, Estimated 55 (*)    All other components within normal limits  TROPONIN I (HIGH SENSITIVITY) - Abnormal; Notable for the following components:   Troponin I (High Sensitivity) 33 (*)    All other components within normal limits  TROPONIN I (HIGH SENSITIVITY) - Abnormal; Notable for the following components:   Troponin I (High Sensitivity) 36 (*)    All other components within normal limits  SARS CORONAVIRUS 2 BY RT PCR  CK  URINALYSIS, ROUTINE W REFLEX MICROSCOPIC    EKG EKG Interpretation  Date/Time:  Wednesday August 29 2021 14:35:57 EDT Ventricular Rate:  84 PR Interval:  152 QRS Duration: 137 QT Interval:  422 QTC Calculation: 499 R Axis:   -22 Text Interpretation: Sinus rhythm Probable left atrial enlargement Left bundle branch block Confirmed by Gareth Morgan [phone]) on 08/29/2021 3:14:56 PM  Radiology DG Chest Portable 1 View  Result Date: 08/29/2021 CLINICAL DATA:   General weakness EXAM: PORTABLE CHEST 1 VIEW COMPARISON:  Radiograph 02/12/2021, chest CT 09/17/2020 FINDINGS: Unchanged enlarged cardiac silhouette. There is mild interstitial prominence bilaterally, similar to prior exams. There is no focal airspace consolidation. There is no pleural effusion. No pneumothorax. No acute osseous abnormality. IMPRESSION: Cardiomegaly with similar chronic interstitial changes bilaterally. No new airspace disease. Electronically Signed   By: Maurine Simmering M.D.   On: 08/29/2021 14:57    Procedures Procedures    Medications Ordered in ED Medications  acetaminophen (TYLENOL) tablet 650 mg (650 mg Oral Given 08/29/21 1557)  sodium chloride 0.9 % bolus 1,000 mL (1,000 mLs Intravenous New Bag/Given 08/29/21 1558)  potassium chloride 10 mEq in 100 mL IVPB (0 mEq Intravenous Stopped 08/29/21 1738)  potassium chloride SA (KLOR-CON M) CR tablet 40 mEq (40 mEq Oral Given 08/29/21 1633)  ondansetron (ZOFRAN) injection 4 mg (4 mg Intravenous Given 08/29/21 1634)  ketorolac (TORADOL) 30 MG/ML injection 15 mg (15 mg Intravenous Given 08/29/21 1736)    ED Course/ Medical Decision Making/ A&P                           Medical Decision Making Amount and/or Complexity of Data Reviewed Labs: ordered. Radiology: ordered.  Risk OTC drugs. Prescription drug management.   Pranavi Aure is here with generalized body aches.  Patient with normal vitals.  No fever.  History of hypertension, asthma, hypertrophic cardiomyopathy who presents to the ED with body aches.  Normal vitals.  No fever.  Patient was given Reclast infusion yesterday for her osteoporosis.  This was the first time she is have this.  Several hours later she started to develop some body aches and chills and flulike symptoms.  Sounds like this could be side effects from this medication.  She does not have a fever.  She denies any cough, abdominal pain, chest pain or shortness of breath or pain with urination.  EKG per  my review  and interpretation shows left bundle branch block with no obvious ischemic changes.  We will give IV fluids check CBC, CMP, urinalysis, CK, troponin, chest x-ray, COVID test.  Differential diagnosis is likely side effect from medication, possibly viral process versus infectious process versus less likely cardiac or pulmonary process.  COVID test is negative.  Per my further review interpretation of labs CK is normal.  Troponin mildly elevated at 33 and 36.  She is not having any chest pain.  Chest x-ray per my review and interpretation shows no obvious pneumonia or volume overload.  She has no significant leukocytosis or anemia.  Overall after Tylenol and Toradol and Zofran and some fluids patient still feels too weak to get up and ambulate.  My suspicion is that her symptoms are secondary to medication side effect but she does have elevated troponin and would benefit likely from observation stay to trend troponin and possibly an echocardiogram.  Will admit to medicine for further symptomatic support and work-up.  This chart was dictated using voice recognition software.  Despite best efforts to proofread,  errors can occur which can change the documentation meaning.         Final Clinical Impression(s) / ED Diagnoses Final diagnoses:  Generalized weakness  Elevated troponin    Rx / DC Orders ED Discharge Orders     None         Lennice Sites, DO 08/29/21 1809

## 2021-08-29 NOTE — ED Notes (Signed)
Lab contacted once again and spoke with Velva Harman, states she will add troponin on.

## 2021-08-29 NOTE — ED Triage Notes (Signed)
Pt to ED via EMS from home with abnormal EKG. LBBB with EMS. Pt rec'd infusion for osteoporosis yesterday at [facility]. States she began to have body aches, weakness, chills last night. Alert and oriented on arrival to ED.   EMS v/s: 126/68 88HR 16RR 97% room air 118 cbg  20LAC

## 2021-08-30 DIAGNOSIS — R531 Weakness: Secondary | ICD-10-CM | POA: Diagnosis not present

## 2021-08-30 DIAGNOSIS — R7401 Elevation of levels of liver transaminase levels: Secondary | ICD-10-CM

## 2021-08-30 DIAGNOSIS — F32A Depression, unspecified: Secondary | ICD-10-CM

## 2021-08-30 DIAGNOSIS — R778 Other specified abnormalities of plasma proteins: Secondary | ICD-10-CM | POA: Diagnosis not present

## 2021-08-30 DIAGNOSIS — R7989 Other specified abnormal findings of blood chemistry: Secondary | ICD-10-CM

## 2021-08-30 DIAGNOSIS — F419 Anxiety disorder, unspecified: Secondary | ICD-10-CM | POA: Diagnosis not present

## 2021-08-30 DIAGNOSIS — E860 Dehydration: Secondary | ICD-10-CM

## 2021-08-30 DIAGNOSIS — I10 Essential (primary) hypertension: Secondary | ICD-10-CM | POA: Diagnosis not present

## 2021-08-30 DIAGNOSIS — I422 Other hypertrophic cardiomyopathy: Secondary | ICD-10-CM

## 2021-08-30 DIAGNOSIS — M791 Myalgia, unspecified site: Secondary | ICD-10-CM

## 2021-08-30 DIAGNOSIS — T796XXA Traumatic ischemia of muscle, initial encounter: Secondary | ICD-10-CM

## 2021-08-30 DIAGNOSIS — M6282 Rhabdomyolysis: Secondary | ICD-10-CM | POA: Diagnosis present

## 2021-08-30 LAB — CBC
HCT: 32.6 % — ABNORMAL LOW (ref 36.0–46.0)
Hemoglobin: 10.9 g/dL — ABNORMAL LOW (ref 12.0–15.0)
MCH: 32.4 pg (ref 26.0–34.0)
MCHC: 33.4 g/dL (ref 30.0–36.0)
MCV: 97 fL (ref 80.0–100.0)
Platelets: 151 10*3/uL (ref 150–400)
RBC: 3.36 MIL/uL — ABNORMAL LOW (ref 3.87–5.11)
RDW: 12.7 % (ref 11.5–15.5)
WBC: 6.1 10*3/uL (ref 4.0–10.5)
nRBC: 0 % (ref 0.0–0.2)

## 2021-08-30 LAB — COMPREHENSIVE METABOLIC PANEL
ALT: 38 U/L (ref 0–44)
AST: 58 U/L — ABNORMAL HIGH (ref 15–41)
Albumin: 3 g/dL — ABNORMAL LOW (ref 3.5–5.0)
Alkaline Phosphatase: 42 U/L (ref 38–126)
Anion gap: 9 (ref 5–15)
BUN: 19 mg/dL (ref 8–23)
CO2: 21 mmol/L — ABNORMAL LOW (ref 22–32)
Calcium: 8.5 mg/dL — ABNORMAL LOW (ref 8.9–10.3)
Chloride: 104 mmol/L (ref 98–111)
Creatinine, Ser: 1.3 mg/dL — ABNORMAL HIGH (ref 0.44–1.00)
GFR, Estimated: 42 mL/min — ABNORMAL LOW (ref >60)
Glucose, Bld: 93 mg/dL (ref 70–99)
Potassium: 3.6 mmol/L (ref 3.5–5.1)
Sodium: 134 mmol/L — ABNORMAL LOW (ref 135–145)
Total Bilirubin: 1 mg/dL (ref 0.3–1.2)
Total Protein: 5.2 g/dL — ABNORMAL LOW (ref 6.5–8.1)

## 2021-08-30 LAB — TROPONIN I (HIGH SENSITIVITY): Troponin I (High Sensitivity): 43 ng/L — ABNORMAL HIGH (ref <18)

## 2021-08-30 LAB — CK: Total CK: 587 U/L — ABNORMAL HIGH (ref 38–234)

## 2021-08-30 LAB — MAGNESIUM: Magnesium: 1.6 mg/dL — ABNORMAL LOW (ref 1.7–2.4)

## 2021-08-30 MED ORDER — POTASSIUM CHLORIDE IN NACL 20-0.9 MEQ/L-% IV SOLN
INTRAVENOUS | Status: DC
  Filled 2021-08-30 (×3): qty 1000

## 2021-08-30 MED ORDER — MAGNESIUM SULFATE 2 GM/50ML IV SOLN
2.0000 g | Freq: Once | INTRAVENOUS | Status: AC
  Administered 2021-08-30: 2 g via INTRAVENOUS
  Filled 2021-08-30: qty 50

## 2021-08-30 NOTE — Evaluation (Signed)
Occupational Therapy Evaluation Patient Details Name: Laurie Bowman MRN: [id number] DOB: 23-May-1940 Today's Date: 08/30/2021   History of Present Illness Laurie Bowman is a 81 y.o. female recevied osteoposis infusion and then went home resulting in body aches and weakness. Pt with soft fall to ground morning of admission. PMHx: of osteoporosis, asthma, GERD, hypertension, CKD 2, alopecia, hyperlipidemia, IBS, depression, breast cancer.   Clinical Impression   Pt PTA: Pt independent with ADL and mobility. Pt currently, set-upA to minguardA overall for ADL. Pt with chills throughout session. Pt grooming at sink in standing with no LOB episodes. Pt performing UB/LB dressing without physical assist. Pt supervisionA for mobility at this time and minguardA for transfers. BP sitting: 110/61, 70 BPM; standing 117/69, 82 BPM; no dizziness reported. Pt reports that her son lives a few towns over who can assist and her spouse. Pt advised to use 1st floor set-up of house to reduce stairs if not feeling up to it. Pt would benefit from continued OT skilled services. OT following acutely.     Recommendations for follow up therapy are one component of a multi-disciplinary discharge planning process, led by the attending physician.  Recommendations may be updated based on patient status, additional functional criteria and insurance authorization.   Follow Up Recommendations  Home health OT    Assistance Recommended at Discharge Set up Supervision/Assistance  Patient can return home with the following A little help with walking and/or transfers;A little help with bathing/dressing/bathroom    Functional Status Assessment  Patient has had a recent decline in their functional status and demonstrates the ability to make significant improvements in function in a reasonable and predictable amount of time.  Equipment Recommendations  BSC/3in1    Recommendations for Other Services       Precautions / Restrictions  Precautions Precautions: Fall Restrictions Weight Bearing Restrictions: No      Mobility Bed Mobility Overal bed mobility: Modified Independent                  Transfers Overall transfer level: Needs assistance Equipment used: None Transfers: Sit to/from Stand Sit to Stand: Min guard           General transfer comment: no LOB.      Balance Overall balance assessment: Mild deficits observed, not formally tested                                         ADL either performed or assessed with clinical judgement   ADL Overall ADL's : Needs assistance/impaired Eating/Feeding: Set up;Sitting   Grooming: Supervision/safety;Standing   Upper Body Bathing: Supervision/ safety;Standing   Lower Body Bathing: Min guard;Sitting/lateral leans;Sit to/from stand;Cueing for safety   Upper Body Dressing : Set up;Sitting   Lower Body Dressing: Min guard;Sitting/lateral leans;Sit to/from stand;Cueing for safety   Toilet Transfer: Min guard;Ambulation;Regular Toilet   Toileting- Water quality scientist and Hygiene: Min guard;Sitting/lateral lean;Sit to/from stand       Functional mobility during ADLs: Min guard;Cueing for safety General ADL Comments: Pt set-upA to minguardA overall for ADL. Pt with chills throughout session. Pt grooming at sink in standing with no LOB episodes. Pt performing UB/LB dressing without physical assist. Pt supervisionA for mobility at this time and minguardA for transfers.     Vision Baseline Vision/History: 0 No visual deficits Ability to See in Adequate Light: 0 Adequate Vision Assessment?: No apparent visual deficits  Perception     Praxis      Pertinent Vitals/Pain Pain Assessment Pain Assessment: 0-10 Pain Score: 5  Pain Location: "all over" Pain Descriptors / Indicators: Discomfort Pain Intervention(s): Monitored during session, Repositioned     Hand Dominance Right   Extremity/Trunk Assessment Upper  Extremity Assessment Upper Extremity Assessment: Overall WFL for tasks assessed   Lower Extremity Assessment Lower Extremity Assessment: Generalized weakness   Cervical / Trunk Assessment Cervical / Trunk Assessment: Normal   Communication Communication Communication: No difficulties   Cognition Arousal/Alertness: Awake/alert Behavior During Therapy: WFL for tasks assessed/performed Overall Cognitive Status: Within Functional Limits for tasks assessed                                       General Comments  BP sitting: 110/61, 70 BPM; standing 117/69, 82 BPM; no dizziness reported    Exercises     Shoulder Instructions      Home Living Family/patient expects to be discharged to:: Private residence Living Arrangements: Spouse/significant other Available Help at Discharge: Family;Available 24 hours/day Type of Home: House Home Access: Stairs to enter [employer] of Steps: 3 front door; 3 garage w/ railing Entrance Stairs-Rails: Can reach both Home Layout: Two level;Bed/bath upstairs Alternate Level Stairs-Number of Steps: 14 steps Alternate Level Stairs-Rails: Right Bathroom Shower/Tub: Tub/shower unit;Walk-in shower;Tub only   Bathroom Toilet: Handicapped height     Home Equipment: Grab bars - toilet;Grab bars - tub/shower;Rolling Walker (2 wheels)   Additional Comments: Spouse uses RW; full bath downstairs walk-in shower      Prior Functioning/Environment Prior Level of Function : Independent/Modified Independent             Mobility Comments: no AD; hamstring injujry, wrist broken 04/2021 ADLs Comments: independent with ADL and iADL tasks; cleaning service 1x/mos        OT Problem List: Decreased activity tolerance      OT Treatment/Interventions: Self-care/ADL training;Therapeutic exercise;Energy conservation;Therapeutic activities;Patient/family education;Balance training    OT Goals(Current goals can be found in the care  plan section) Acute Rehab OT Goals Patient Stated Goal: to go home OT Goal Formulation: With patient Time For Goal Achievement: 09/13/21 Potential to Achieve Goals: Good ADL Goals Additional ADL Goal #1: Pt will perform x5 mins of OOB ADL tasks with supervisionA. Additional ADL Goal #2: Pt will state 3 energy cosnervation techniques to use at home to increase activity tolerance.  OT Frequency: Min 2X/week    Co-evaluation              AM-PAC OT "6 Clicks" Daily Activity     Outcome Measure Help from another person eating meals?: None Help from another person taking care of personal grooming?: None Help from another person toileting, which includes using toliet, bedpan, or urinal?: A Little Help from another person bathing (including washing, rinsing, drying)?: A Little Help from another person to put on and taking off regular upper body clothing?: None Help from another person to put on and taking off regular lower body clothing?: A Little 6 Click Score: 21   End of Session Equipment Utilized During Treatment: Gait belt Nurse Communication: Mobility status  Activity Tolerance: Patient tolerated treatment well Patient left: in bed;with call bell/phone within reach  OT Visit Diagnosis: Unsteadiness on feet (R26.81)                Time: 4098-1191 OT Time Calculation (min): 36 min  Charges:  OT General Charges $OT Visit: 1 Visit OT Evaluation $OT Eval Moderate Complexity: 1 Mod OT Treatments $Self Care/Home Management : 8-22 mins  Jefferey Pica, OTR/L Acute Rehabilitation Services Office: [phone]   [id number] 08/30/2021, 2:54 PM

## 2021-08-30 NOTE — ED Notes (Signed)
ED TO INPATIENT HANDOFF REPORT  ED Nurse Name and Phone #: Lysbeth Galas [phone]  S Name/Age/Gender Laurie Bowman 81 y.o. female Room/Bed: 019C/019C  Code Status   Code Status: Full Code  Home/SNF/Other Home Patient oriented to: self, place, time, and situation Is this baseline? Yes   Triage Complete: Triage complete  Chief Complaint Generalized weakness [R53.1]  Triage Note Pt to ED via EMS from home with abnormal EKG. LBBB with EMS. Pt rec'd infusion for osteoporosis yesterday at [facility] States she began to have body aches, weakness, chills last night. Alert and oriented on arrival to ED.   EMS v/s: 126/68 88HR 16RR 97% room air 118 cbg  20LAC   Allergies Allergies  Allergen Reactions   Cefaclor Itching    Other reaction(s): Flushing (ALLERGY/intolerance)   Codeine Itching and Nausea And Vomiting   Fructose Swelling    Swelling and GI upset, Fructose Granules    Sulfa Antibiotics Swelling   Meperidine Nausea And Vomiting and Itching   Elemental Sulfur Swelling    Level of Care/Admitting Diagnosis ED Disposition     ED Disposition  Admit   Condition  --   Comment  Hospital Area: [facility] [[id number]]  Level of Care: Telemetry Medical [104]  May place patient in observation at [facility] or [facility] if equivalent level of care is available:: No  Covid Evaluation: Asymptomatic - no recent exposure (last 10 days) testing not required  Diagnosis: Generalized weakness [563149]  Admitting Physician: Marcelyn Bruins [[id number]]  Attending Physician: Marcelyn Bruins [[id number]]          B Medical/Surgery History Past Medical History:  Diagnosis Date   Abnormal mammogram 08/16/2013   Acute sinusitis 06/10/2016   Allergic rhinitis 09/28/2015   Alopecia 12/16/2013   Aortic atherosclerosis ([place]) 09/27/2020   Asthma    Asthma with acute exacerbation 06/10/2016   Benign essential hypertension 07/08/2011   Benign neoplasm of  colon 12/16/2013   Cancer ([place])    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 Possibly aggravated by Tamoxifen   Fibrocystic breast changes 07/08/2011   Fracture, [place], left, closed 05/03/2021   GERD (gastroesophageal reflux disease) 02/06/2016   Heart murmur, systolic 74/03/2876   Overview:  Overview:  Mild MR/TR, echo 2007 without change since 2001   Hyperlipidemia LDL goal <70 12/16/2013   Hyperlipidemia, unspecified 12/16/2013   Hypertension    Hypertensive heart disease 04/30/2018   Hypertrophic cardiomyopathy ([place]) 04/30/2018   Hypokalemia 09/27/2020   Infection due to Staphylococcus epidermidis 09/27/2020   Irritable bowel syndrome 12/16/2013   LBBB (left bundle branch block) 6/76/7209   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   Major depressive disorder, recurrent, unspecified ([place]) 12/16/2013   Mitral regurgitation 04/30/2018   Moderate persistent asthma 09/28/2015   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   Osteopenia with high  risk of fracture 06/21/2015   Other chest pain 09/27/2020   Other specified disorders of eyelid 11/09/2015   Pain in unspecified knee 12/16/2013   Peripheral visual field defect of both eyes 04/21/2015   PVC's (premature ventricular contractions) 04/30/2018   Rotator cuff tendonitis 12/16/2013   Undiagnosed cardiac  murmurs 12/16/2013   Overview:  Mild MR/TR, echo 2007 without change since 2001   Urticaria    Ventricular premature depolarization 12/16/2013   Wound dehiscence 05/08/2015   Past Surgical History:  Procedure Laterality Date   ABDOMINAL ADHESION SURGERY     ADENOIDECTOMY     BREAST SURGERY     carpel tunnel     CESAREAN SECTION     KNEE SURGERY     LEG SURGERY     bone graft   TONSILLECTOMY     VEIN SURGERY     Right Leg     A IV Location/Drains/Wounds Patient Lines/Drains/Airways Status     Active Line/Drains/Airways     Name Placement date Placement time Site Days   Peripheral IV 08/29/21 20 G Left Antecubital 08/29/21  1431  Antecubital  1            Intake/Output Last 24 hours  Intake/Output Summary (Last 24 hours) at 08/30/2021 1334 Last data filed at 08/29/2021 1810 Gross per 24 hour  Intake 1000 ml  Output --  Net 1000 ml    Labs/Imaging Results for orders placed or performed during the hospital encounter of 08/29/21 (from the past 48 hour(s))  SARS Coronavirus 2 by RT PCR (hospital order, performed in [facility] hospital lab) *cepheid single result test* Anterior Nasal Swab     Status: None   Collection Time: 08/29/21  2:37 PM   Specimen: Anterior Nasal Swab  Result Value Ref Range   SARS Coronavirus 2 by RT PCR NEGATIVE NEGATIVE    Comment: (NOTE) SARS-CoV-2 target nucleic acids are NOT DETECTED.  The SARS-CoV-2 RNA is generally detectable in upper and lower respiratory specimens during the acute phase of infection. The lowest concentration of SARS-CoV-2 viral copies this assay can detect is 250 copies / mL. A negative result does not preclude SARS-CoV-2 infection and should not be used as the sole basis for treatment or other patient management decisions.  A negative result may occur with improper specimen collection / handling, submission of specimen other than nasopharyngeal swab, presence of viral mutation(s) within the areas targeted by this  assay, and inadequate number of viral copies (<250 copies / mL). A negative result must be combined with clinical observations, patient history, and epidemiological information.  Fact Sheet for Patients:   https://www.patel.info/  Fact Sheet for Healthcare Providers: https://hall.com/  This test is not yet approved or  cleared by the [place] FDA and has been authorized for detection and/or diagnosis of SARS-CoV-2 by FDA under an Emergency Use Authorization (EUA).  This EUA will remain in effect (meaning this test can be used) for the duration of the COVID-19 declaration under Section 564(b)(1) of the Act, 21 U.S.C. section 360bbb-3(b)(1), unless the authorization is terminated or revoked sooner.  Performed at [facility], [place] [address], [place], [place] [address]   CBC with Differential     Status: Abnormal   Collection Time: 08/29/21  2:53 PM  Result Value Ref Range   WBC 9.4 4.0 - 10.5 K/uL   RBC 4.18 3.87 - 5.11 MIL/uL   Hemoglobin 13.8 12.0 - 15.0 g/dL   HCT 39.6 36.0 - 46.0 %  MCV 94.7 80.0 - 100.0 fL   MCH 33.0 26.0 - 34.0 pg   MCHC 34.8 30.0 - 36.0 g/dL   RDW 12.6 11.5 - 15.5 %   Platelets 198 150 - 400 K/uL    Comment: REPEATED TO VERIFY   nRBC 0.0 0.0 - 0.2 %   Neutrophils Relative % 94 %   Neutro Abs 8.8 (H) 1.7 - 7.7 K/uL   Lymphocytes Relative 2 %   Lymphs Abs 0.2 (L) 0.7 - 4.0 K/uL   Monocytes Relative 3 %   Monocytes Absolute 0.3 0.1 - 1.0 K/uL   Eosinophils Relative 0 %   Eosinophils Absolute 0.0 0.0 - 0.5 K/uL   Basophils Relative 0 %   Basophils Absolute 0.0 0.0 - 0.1 K/uL   Immature Granulocytes 1 %   Abs Immature Granulocytes 0.05 0.00 - 0.07 K/uL    Comment: Performed at [facility] [address], [place], [place] [address]  Comprehensive metabolic panel     Status: Abnormal   Collection Time: 08/29/21  2:53 PM  Result Value Ref Range   Sodium 136 135 - 145 mmol/L   Potassium 3.0  (L) 3.5 - 5.1 mmol/L   Chloride 102 98 - 111 mmol/L   CO2 25 22 - 32 mmol/L   Glucose, Bld 103 (H) 70 - 99 mg/dL    Comment: Glucose reference range applies only to samples taken after fasting for at least 8 hours.   BUN 11 8 - 23 mg/dL   Creatinine, Ser 1.03 (H) 0.44 - 1.00 mg/dL   Calcium 9.9 8.9 - 10.3 mg/dL   Total Protein 6.6 6.5 - 8.1 g/dL   Albumin 3.7 3.5 - 5.0 g/dL   AST 50 (H) 15 - 41 U/L   ALT 39 0 - 44 U/L   Alkaline Phosphatase 51 38 - 126 U/L   Total Bilirubin 1.4 (H) 0.3 - 1.2 mg/dL   GFR, Estimated 55 (L) >60 mL/min    Comment: (NOTE) Calculated using the CKD-EPI Creatinine Equation (2021)    Anion gap 9 5 - 15    Comment: Performed at [facility] [address] [place], [place] [address]  CK     Status: None   Collection Time: 08/29/21  2:53 PM  Result Value Ref Range   Total CK 151 38 - 234 U/L    Comment: Performed at [facility], [place] [address] [place], [place] [address]  Troponin I (High Sensitivity)     Status: Abnormal   Collection Time: 08/29/21  3:08 PM  Result Value Ref Range   Troponin I (High Sensitivity) 33 (H) <18 ng/L    Comment: (NOTE) Elevated high sensitivity troponin I (hsTnI) values and significant  changes across serial measurements may suggest ACS but many other  chronic and acute conditions are known to elevate hsTnI results.  Refer to the "Links" section for chest pain algorithms and additional  guidance. Performed at [facility], [place] [address], [place], [place] [address]   Troponin I (High Sensitivity)     Status: Abnormal   Collection Time: 08/29/21  4:43 PM  Result Value Ref Range   Troponin I (High Sensitivity) 36 (H) <18 ng/L    Comment: (NOTE) Elevated high sensitivity troponin I (hsTnI) values and significant  changes across serial measurements may suggest ACS but many other  chronic and acute conditions are known to elevate hsTnI results.  Refer to the "Links" section for chest pain algorithms  and  additional  guidance. Performed at [facility], [place] [address], [place], [place] [address]   Urinalysis, Routine w reflex microscopic Urine, Clean Catch     Status: None   Collection Time: 08/29/21  7:17 PM  Result Value Ref Range   Color, Urine YELLOW YELLOW   APPearance CLEAR CLEAR   Specific Gravity, Urine 1.020 1.005 - 1.030   pH 7.0 5.0 - 8.0   Glucose, UA NEGATIVE NEGATIVE mg/dL   Hgb urine dipstick NEGATIVE NEGATIVE   Bilirubin Urine NEGATIVE NEGATIVE   Ketones, ur NEGATIVE NEGATIVE mg/dL   Protein, ur NEGATIVE NEGATIVE mg/dL   Nitrite NEGATIVE NEGATIVE   Leukocytes,Ua NEGATIVE NEGATIVE    Comment: Performed at [facility] [address], [place], [place] [address]  Magnesium     Status: Abnormal   Collection Time: 08/30/21  4:21 AM  Result Value Ref Range   Magnesium 1.6 (L) 1.7 - 2.4 mg/dL    Comment: Performed at [facility] [address] [place],  [address]  Comprehensive metabolic panel     Status: Abnormal   Collection Time: 08/30/21  4:21 AM  Result Value Ref Range   Sodium 134 (L) 135 - 145 mmol/L   Potassium 3.6 3.5 - 5.1 mmol/L   Chloride 104 98 - 111 mmol/L   CO2 21 (L) 22 - 32 mmol/L   Glucose, Bld 93 70 - 99 mg/dL    Comment: Glucose reference range applies only to samples taken after fasting for at least 8 hours.   BUN 19 8 - 23 mg/dL   Creatinine, Ser 1.30 (H) 0.44 - 1.00 mg/dL   Calcium 8.5 (L) 8.9 - 10.3 mg/dL   Total Protein 5.2 (L) 6.5 - 8.1 g/dL   Albumin 3.0 (L) 3.5 - 5.0 g/dL   AST 58 (H) 15 - 41 U/L   ALT 38 0 - 44 U/L   Alkaline Phosphatase 42 38 - 126 U/L   Total Bilirubin 1.0 0.3 - 1.2 mg/dL   GFR, Estimated 42 (L) >60 mL/min    Comment: (NOTE) Calculated using the CKD-EPI Creatinine Equation (2021)    Anion gap 9 5 - 15    Comment: Performed at [facility], [place] [address], [place], [place] [address]  CBC     Status: Abnormal   Collection Time: 08/30/21  4:21 AM  Result Value Ref Range   WBC  6.1 4.0 - 10.5 K/uL   RBC 3.36 (L) 3.87 - 5.11 MIL/uL   Hemoglobin 10.9 (L) 12.0 - 15.0 g/dL   HCT 32.6 (L) 36.0 - 46.0 %   MCV 97.0 80.0 - 100.0 fL   MCH 32.4 26.0 - 34.0 pg   MCHC 33.4 30.0 - 36.0 g/dL   RDW 12.7 11.5 - 15.5 %   Platelets 151 150 - 400 K/uL   nRBC 0.0 0.0 - 0.2 %    Comment: Performed at [facility], [place] [address], [place], [place] [address]  Troponin I (High Sensitivity)     Status: Abnormal   Collection Time: 08/30/21  4:21 AM  Result Value Ref Range   Troponin I (High Sensitivity) 43 (H) <18 ng/L    Comment: (NOTE) Elevated high sensitivity troponin I (hsTnI) values and significant  changes across serial measurements may suggest ACS but many other  chronic and acute conditions are known to elevate hsTnI results.  Refer to the "Links" section for chest pain algorithms and additional  guidance.  Performed at [facility], [place] [address], [place], [place] [address]   CK     Status: Abnormal   Collection Time: 08/30/21  4:21 AM  Result Value Ref Range   Total CK 587 (H) 38 - 234 U/L    Comment: Performed at [facility], [place] [address], [place],  [address]   DG Chest Portable 1 View  Result Date: 08/29/2021 CLINICAL DATA:  General weakness EXAM: PORTABLE CHEST 1 VIEW COMPARISON:  Radiograph 02/12/2021, chest CT 09/17/2020 FINDINGS: Unchanged enlarged cardiac silhouette. There is mild interstitial prominence bilaterally, similar to prior exams. There is no focal airspace consolidation. There is no pleural effusion. No pneumothorax. No acute osseous abnormality. IMPRESSION: Cardiomegaly with similar chronic interstitial changes bilaterally. No new airspace disease. Electronically Signed   By: Maurine Simmering M.D.   On: 08/29/2021 14:57    Pending Labs Unresulted Labs (From admission, onward)     Start     Ordered   09/05/21 0500  Creatinine, serum  (enoxaparin (LOVENOX)    CrCl >/= 30 ml/min)  Weekly,   R     Comments: while on enoxaparin  therapy    08/29/21 1836   08/31/21 0500  Sedimentation rate  Tomorrow morning,   R        08/30/21 1221   08/31/21 0500  C-reactive protein  Tomorrow morning,   R        08/30/21 1221   08/31/21 0500  CK  Tomorrow morning,   R        08/30/21 1221   08/31/21 0500  Comprehensive metabolic panel  Tomorrow morning,   R        08/30/21 1221   08/31/21 0500  Magnesium  Tomorrow morning,   R        08/30/21 1221   08/31/21 0500  Phosphorus  Tomorrow morning,   R        08/30/21 1221   08/31/21 0500  CBC  Tomorrow morning,   R        08/30/21 1221   08/31/21 0500  Vitamin B12  (Anemia Panel (PNL))  Tomorrow morning,   R        08/30/21 1221   08/31/21 0500  Folate  (Anemia Panel (PNL))  Tomorrow morning,   R        08/30/21 1221   08/31/21 0500  Iron and TIBC  (Anemia Panel (PNL))  Tomorrow morning,   R        08/30/21 1221   08/31/21 0500  Ferritin  (Anemia Panel (PNL))  Tomorrow morning,   R        08/30/21 1221   08/31/21 0500  Reticulocytes  (Anemia Panel (PNL))  Tomorrow morning,   R        08/30/21 1221            Vitals/Pain Today's Vitals   08/30/21 0433 08/30/21 0711 08/30/21 0810 08/30/21 1100  BP:   (!) 96/55 106/69  Pulse:   65 67  Resp:   18 12  Temp:      TempSrc:      SpO2: 94%  100% 99%  Weight:      Height:      PainSc:  4       Isolation Precautions Airborne and Contact precautions  Medications Medications  aspirin chewable tablet 81 mg (81 mg Oral Given 08/30/21 1049)  dicyclomine (BENTYL) tablet 20 mg (has no administration in time range)  pantoprazole (PROTONIX) EC tablet 40 mg (40 mg Oral Given 08/30/21 1049)  albuterol (PROVENTIL) (2.5 MG/3ML) 0.083% nebulizer solution (has no administration in time range)  enoxaparin (LOVENOX) injection 40 mg (40 mg Subcutaneous Not Given 08/29/21 2357)  sodium chloride flush (NS) 0.9 % injection 3 mL (3 mLs Intravenous Given 08/30/21 1045)  acetaminophen (TYLENOL) tablet 650 mg (650 mg Oral Given 08/30/21 0436)     Or  acetaminophen (TYLENOL) suppository 650 mg ( Rectal See Alternative 08/30/21 0436)  polyethylene glycol (MIRALAX / GLYCOLAX) packet 17 g (has no administration in time range)  calcium-vitamin D (OSCAL WITH D) 500-5 MG-MCG per tablet 1 tablet (1 tablet Oral Given 08/30/21 1049)  budesonide (PULMICORT) nebulizer solution 0.25 mg (0.25 mg Nebulization Given 08/30/21 0813)  0.9 % NaCl with KCl 20 mEq/ L  infusion ( Intravenous New Bag/Given 08/30/21 0811)  acetaminophen (TYLENOL) tablet 650 mg (650 mg Oral Given 08/29/21 1557)  sodium chloride 0.9 % bolus 1,000 mL (0 mLs Intravenous Stopped 08/29/21 1810)  potassium chloride 10 mEq in 100 mL IVPB (0 mEq Intravenous Stopped 08/29/21 1738)  potassium chloride SA (KLOR-CON M) CR tablet 40 mEq (40 mEq Oral Given 08/29/21 1633)  ondansetron (ZOFRAN) injection 4 mg (4 mg Intravenous Given 08/29/21 1634)  ketorolac (TORADOL) 30 MG/ML injection 15 mg (15 mg Intravenous Given 08/29/21 1736)  magnesium sulfate IVPB 2 g 50 mL (0 g Intravenous Stopped 08/30/21 1043)    Mobility walks with person assist Moderate fall risk   Focused Assessments Neuro Assessment Handoff:  Swallow screen pass? No          Neuro Assessment: Within Defined Limits Neuro Checks:      Last Documented NIHSS Modified Score:   Has TPA been given? No If patient is a Neuro Trauma and patient is going to OR before floor call report to [facility] nurse: [phone] or [phone]   R Recommendations: See Admitting Provider Note  Report given to:   Additional Notes:

## 2021-08-30 NOTE — Evaluation (Signed)
Physical Therapy Evaluation Patient Details Name: Laurie Bowman MRN: [id number] DOB: 12/18/1940 Today's Date: 08/30/2021  History of Present Illness  Laurie Bowman is a 81 y.o. female recevied osteoposis infusion and then went home resulting in body aches and weakness. Pt with soft fall to ground morning of admission. PMHx: of osteoporosis, asthma, GERD, hypertension, CKD 2, alopecia, hyperlipidemia, IBS, depression, breast cancer.  Clinical Impression  Pt presents to PT with decreased mobility due to decreased strength, decreased balance, and decreased functional activity tolerance. Pt has support at home and will likely need HHPT at DC.         Recommendations for follow up therapy are one component of a multi-disciplinary discharge planning process, led by the attending physician.  Recommendations may be updated based on patient status, additional functional criteria and insurance authorization.  Follow Up Recommendations Home health PT    Assistance Recommended at Discharge Intermittent Supervision/Assistance  Patient can return home with the following  A little help with walking and/or transfers;Help with stairs or ramp for entrance    Equipment Recommendations None recommended by PT  Recommendations for Other Services       Functional Status Assessment Patient has had a recent decline in their functional status and demonstrates the ability to make significant improvements in function in a reasonable and predictable amount of time.     Precautions / Restrictions Precautions Precautions: Fall Restrictions Weight Bearing Restrictions: No      Mobility  Bed Mobility Overal bed mobility: Needs Assistance Bed Mobility: Supine to Sit     Supine to sit: Min assist     General bed mobility comments: Assist to elevate trunk into sitting    Transfers Overall transfer level: Needs assistance Equipment used: Rolling walker (2 wheels) Transfers: Sit to/from Stand Sit to  Stand: Min guard           General transfer comment: Assist for safety and lines    Ambulation/Gait Ambulation/Gait assistance: Min assist Gait Distance (Feet): 35 Feet Assistive device: Rolling walker (2 wheels) Gait Pattern/deviations: Step-through pattern, Decreased stride length Gait velocity: decr Gait velocity interpretation: <1.31 ft/sec, indicative of household ambulator   General Gait Details: Assist for balance and Scientist, research (medical)    Modified Rankin (Stroke Patients Only)       Balance Overall balance assessment: Needs assistance Sitting-balance support: No upper extremity supported, Feet supported Sitting balance-Leahy Scale: Good     Standing balance support: No upper extremity supported, During functional activity Standing balance-Leahy Scale: Fair                               Pertinent Vitals/Pain Pain Assessment Pain Assessment: Faces Faces Pain Scale: Hurts even more Pain Location: "all over" with movement Pain Descriptors / Indicators: Grimacing, Sore Pain Intervention(s): Monitored during session, Repositioned    Home Living Family/patient expects to be discharged to:: Private residence Living Arrangements: Spouse/significant other Available Help at Discharge: Family;Available 24 hours/day Type of Home: House Home Access: Stairs to enter Entrance Stairs-Rails: Can reach both Entrance Stairs-Number of Steps: 3 front door; 3 garage w/ railing Alternate Level Stairs-Number of Steps: 14 steps Home Layout: Two level;Bed/bath upstairs Home Equipment: Grab bars - toilet;Grab bars - tub/shower;Rolling Walker (2 wheels) Additional Comments: Spouse uses RW; full bath downstairs walk-in shower    Prior Function Prior Level of Function : Independent/Modified Independent  Mobility Comments: no AD; hamstring injujry, wrist broken 04/2021 ADLs Comments: independent with ADL and iADL tasks;  cleaning service 1x/mos     Hand Dominance   Dominant Hand: Right    Extremity/Trunk Assessment   Upper Extremity Assessment Upper Extremity Assessment: Defer to OT evaluation    Lower Extremity Assessment Lower Extremity Assessment: Generalized weakness    Cervical / Trunk Assessment Cervical / Trunk Assessment: Normal  Communication   Communication: No difficulties  Cognition Arousal/Alertness: Awake/alert Behavior During Therapy: WFL for tasks assessed/performed Overall Cognitive Status: Within Functional Limits for tasks assessed                                          General Comments      Exercises     Assessment/Plan    PT Assessment Patient needs continued PT services  PT Problem List Decreased strength;Decreased activity tolerance;Decreased balance;Decreased mobility       PT Treatment Interventions DME instruction;Gait training;Stair training;Functional mobility training;Therapeutic activities;Therapeutic exercise;Balance training;Patient/family education    PT Goals (Current goals can be found in the Care Plan section)  Acute Rehab PT Goals Patient Stated Goal: return home PT Goal Formulation: With patient Time For Goal Achievement: 09/06/21 Potential to Achieve Goals: Good    Frequency Min 3X/week     Co-evaluation               AM-PAC PT "6 Clicks" Mobility  Outcome Measure Help needed turning from your back to your side while in a flat bed without using bedrails?: None Help needed moving from lying on your back to sitting on the side of a flat bed without using bedrails?: A Little Help needed moving to and from a bed to a chair (including a wheelchair)?: A Little Help needed standing up from a chair using your arms (e.g., wheelchair or bedside chair)?: A Little Help needed to walk in hospital room?: A Little Help needed climbing 3-5 steps with a railing? : A Lot 6 Click Score: 18    End of Session   Activity  Tolerance: Patient limited by fatigue Patient left: in chair;with call bell/phone within reach;with chair alarm set;with family/visitor present Nurse Communication: Mobility status PT Visit Diagnosis: Unsteadiness on feet (R26.81);Muscle weakness (generalized) (M62.81)    Time: 6269-4854 PT Time Calculation (min) (ACUTE ONLY): 22 min   Charges:   PT Evaluation $PT Eval Moderate Complexity: [place] [facility] 08/30/2021, 4:57 PM

## 2021-08-30 NOTE — Care Management Obs Status (Signed)
[facility] NOTIFICATION   Patient Details  Name: Janisa Labus MRN: [id number] Date of Birth: 08-20-1940   Medicare Observation Status Notification Given:  Yes    Zenon Mayo, RN 08/30/2021, 5:04 PM

## 2021-08-30 NOTE — Progress Notes (Signed)
PROGRESS NOTE  Laurie Bowman BPZ:[id number] DOB: 1940/06/15   PCP: Derrill Center., MD  Patient is from: Home.  Independently ambulates at baseline.  DOA: 08/29/2021 LOS: 0  Chief complaints Chief Complaint  Patient presents with   Weakness     Brief Narrative / Interim history: 81 year old F with PMH of osteoporosis, recent left wrist fracture, HOCM, LBBB, heart murmur, asthma, GERD, HTN, anxiety, depression, IBS, benign breast lesion s/p remote lumpectomy presenting with myalgia and generalized weakness hours after Reclast infusion for osteoporosis.  She fell getting out of the bed the morning of admission and wedged between the bed and the nightstand.  She denies hitting her head or loss of consciousness.  She called EMS and brought to ED for further evaluation.  In ED, stable vitals.  K3.0. Cr 1.03 (baseline).  T. bili 1.4.  CK1 51.  WBC 9.4 with left shift.  Troponin 33>> 36.  EKG with LBBB (chronic).  UA negative.  CXR cardiomegaly with similar chronic interstitial changes bilaterally but no acute finding.  Patient was started on potassium supplementation, IV fluid and pain medication, and admitted for generalized weakness and myalgia. Subjective: Seen and examined earlier this morning.  She continues to endorse significant myalgia, arthralgia and generalized weakness.  She denies shortness of breath, cough, nausea, vomiting, abdominal pain or UTI symptoms.  She denies focal neuro symptoms.  Objective: Vitals:   08/30/21 0400 08/30/21 0433 08/30/21 0810 08/30/21 1100  BP: 105/67  (!) 96/55 106/69  Pulse: 70  65 67  Resp: '19  18 12  '$ Temp:      TempSrc:      SpO2: 95% 94% 100% 99%  Weight:      Height:        Examination:  GENERAL: No apparent distress.  Nontoxic. HEENT: MMM.  Vision and hearing grossly intact.  NECK: Supple.  No apparent JVD.  RESP:  No IWOB.  Fair aeration bilaterally. CVS:  RRR. Heart sounds normal.  ABD/GI/GU: BS+. Abd soft, NTND.  MSK/EXT:  Moves  extremities. No apparent deformity. No edema.  SKIN: no apparent skin lesion or wound NEURO: Awake, alert and oriented appropriately.  No apparent focal neuro deficit. PSYCH: Calm. Normal affect.   Procedures:  None  Microbiology summarized: COVID-19 PCR nonreactive  Assessment and plan: Principal Problem:   Generalized weakness Active Problems:   Moderate persistent asthma   GERD (gastroesophageal reflux disease)   Benign essential hypertension   CRF (chronic renal failure), stage 2 (mild)   Hyperlipidemia, unspecified   Irritable bowel syndrome   Major depressive disorder, recurrent, unspecified (HCC)   Hypokalemia   Elevated troponin  Generalized weakness/myalgia/arthralgia-likely due to Reclast infusion.  She is also on Crestor. Mild rhabdomyolysis-could be traumatic from fall or related to statin/Reclast.  CK rising. -Supportive care with IV fluid and pain meds. -Trend CK -Hold Crestor. -PT/OT eval  Elevated creatinine/dehydration: Baseline Cr ~1.1.  Received Toradol injection in ED. Recent Labs    08/29/21 1453 08/30/21 0421  BUN 11 19  CREATININE 1.03* 1.30*  -IV NS with KCl at 100 cc an hour -Avoid nephrotoxic meds.  Elevated troponin/chronic LBBB: Elevated troponin likely demand ischemia.  She has no cardiopulmonary symptoms. -No need for further cardiac work-up  Normocytic anemia: Drop in Hgb likely dilutional from hemodilution.  Denies melena hematochezia. Recent Labs    08/29/21 1453 08/30/21 0421  HGB 13.8 10.9*  -Continue monitoring -Check anemia panel    Hypokalemia/hypomagnesemia: Partly due to HCTZ. -Hold HCTZ -Replenish and recheck.  Elevated AST: Mild.  Likely due to elevated CK. -Monitor  Mild intermittent asthma: Stable -Continue home inhalers.  GERD -Continue home PPI  Hypertension: Normotensive but soft. -IV fluid as above. -Hold home HCTZ and verapamil  History of HOCM/MVR: Denies dizziness or syncopal episode. -Resume  home verapamil when able.  Hyperlipidemia -Hold Crestor given myalgia and rhabdomyolysis  Anxiety/depression/IBS: Stable. -Continue home meds  Recent wrist fracture: -Continue therapy     DVT prophylaxis:  enoxaparin (LOVENOX) injection 40 mg Start: 08/29/21 1845  Code Status: Full code Family Communication: None at bedside Level of care: Telemetry Medical Status is: Observation The patient will require care spanning > 2 midnights and should be moved to inpatient because: Ongoing myalgia, generalized weakness and rhabdomyolysis with rising CK   Final disposition: Likely home once medically stable. Consultants:  None  Sch Meds:  Scheduled Meds:  aspirin  81 mg Oral Daily   budesonide (PULMICORT) nebulizer solution  0.25 mg Nebulization BID   calcium-vitamin D  1 tablet Oral Q breakfast   enoxaparin (LOVENOX) injection  40 mg Subcutaneous Q24H   pantoprazole  40 mg Oral Daily   sodium chloride flush  3 mL Intravenous Q12H   Continuous Infusions:  0.9 % NaCl with KCl 20 mEq / L 100 mL/hr at 08/30/21 0811   PRN Meds:.acetaminophen **OR** acetaminophen, albuterol, dicyclomine, polyethylene glycol  Antimicrobials: Anti-infectives (From admission, onward)    None        I have personally reviewed the following labs and images: CBC: Recent Labs  Lab 08/29/21 1453 08/30/21 0421  WBC 9.4 6.1  NEUTROABS 8.8*  --   HGB 13.8 10.9*  HCT 39.6 32.6*  MCV 94.7 97.0  PLT 198 151   BMP &GFR Recent Labs  Lab 08/29/21 1453 08/30/21 0421  NA 136 134*  K 3.0* 3.6  CL 102 104  CO2 25 21*  GLUCOSE 103* 93  BUN 11 19  CREATININE 1.03* 1.30*  CALCIUM 9.9 8.5*  MG  --  1.6*   Estimated Creatinine Clearance: 29.8 mL/min (A) (by C-G formula based on SCr of 1.3 mg/dL (H)). Liver & Pancreas: Recent Labs  Lab 08/29/21 1453 08/30/21 0421  AST 50* 58*  ALT 39 38  ALKPHOS 51 42  BILITOT 1.4* 1.0  PROT 6.6 5.2*  ALBUMIN 3.7 3.0*   No results for input(s):  "LIPASE", "AMYLASE" in the last 168 hours. No results for input(s): "AMMONIA" in the last 168 hours. Diabetic: No results for input(s): "HGBA1C" in the last 72 hours. No results for input(s): "GLUCAP" in the last 168 hours. Cardiac Enzymes: Recent Labs  Lab 08/29/21 1453 08/30/21 0421  CKTOTAL 151 587*   No results for input(s): "PROBNP" in the last 8760 hours. Coagulation Profile: No results for input(s): "INR", "PROTIME" in the last 168 hours. Thyroid Function Tests: No results for input(s): "TSH", "T4TOTAL", "FREET4", "T3FREE", "THYROIDAB" in the last 72 hours. Lipid Profile: No results for input(s): "CHOL", "HDL", "LDLCALC", "TRIG", "CHOLHDL", "LDLDIRECT" in the last 72 hours. Anemia Panel: No results for input(s): "VITAMINB12", "FOLATE", "FERRITIN", "TIBC", "IRON", "RETICCTPCT" in the last 72 hours. Urine analysis:    Component Value Date/Time   COLORURINE YELLOW 08/29/2021 1917   APPEARANCEUR CLEAR 08/29/2021 1917   LABSPEC 1.020 08/29/2021 1917   PHURINE 7.0 08/29/2021 1917   GLUCOSEU NEGATIVE 08/29/2021 [place] NEGATIVE 08/29/2021 [place] 08/29/2021 [place] 08/29/2021 [place] NEGATIVE 08/29/2021 1917   NITRITE NEGATIVE 08/29/2021 1917  LEUKOCYTESUR NEGATIVE 08/29/2021 1917   Sepsis Labs: Invalid input(s): "PROCALCITONIN", "LACTICIDVEN"  Microbiology: Recent Results (from the past 240 hour(s))  SARS Coronavirus 2 by RT PCR (hospital order, performed in [facility] hospital lab) *cepheid single result test* Anterior Nasal Swab     Status: None   Collection Time: 08/29/21  2:37 PM   Specimen: Anterior Nasal Swab  Result Value Ref Range Status   SARS Coronavirus 2 by RT PCR NEGATIVE NEGATIVE Final    Comment: (NOTE) SARS-CoV-2 target nucleic acids are NOT DETECTED.  The SARS-CoV-2 RNA is generally detectable in upper and lower respiratory specimens during the acute phase of infection. The lowest concentration  of SARS-CoV-2 viral copies this assay can detect is 250 copies / mL. A negative result does not preclude SARS-CoV-2 infection and should not be used as the sole basis for treatment or other patient management decisions.  A negative result may occur with improper specimen collection / handling, submission of specimen other than nasopharyngeal swab, presence of viral mutation(s) within the areas targeted by this assay, and inadequate number of viral copies (<250 copies / mL). A negative result must be combined with clinical observations, patient history, and epidemiological information.  Fact Sheet for Patients:   https://www.patel.info/  Fact Sheet for Healthcare Providers: https://hall.com/  This test is not yet approved or  cleared by the [place] FDA and has been authorized for detection and/or diagnosis of SARS-CoV-2 by FDA under an Emergency Use Authorization (EUA).  This EUA will remain in effect (meaning this test can be used) for the duration of the COVID-19 declaration under Section 564(b)(1) of the Act, 21 U.S.C. section 360bbb-3(b)(1), unless the authorization is terminated or revoked sooner.  Performed at [facility], [place] [address] [place], [place] [address]     Radiology Studies: DG Chest Portable 1 View  Result Date: 08/29/2021 CLINICAL DATA:  General weakness EXAM: PORTABLE CHEST 1 VIEW COMPARISON:  Radiograph 02/12/2021, chest CT 09/17/2020 FINDINGS: Unchanged enlarged cardiac silhouette. There is mild interstitial prominence bilaterally, similar to prior exams. There is no focal airspace consolidation. There is no pleural effusion. No pneumothorax. No acute osseous abnormality. IMPRESSION: Cardiomegaly with similar chronic interstitial changes bilaterally. No new airspace disease. Electronically Signed   By: Maurine Simmering M.D.   On: 08/29/2021 14:57      Amarii Amy T. Garwin  If 7PM-7AM, please  contact night-coverage [email or web address] 08/30/2021, 11:56 AM

## 2021-08-31 ENCOUNTER — Observation Stay (HOSPITAL_COMMUNITY): Payer: Medicare PPO

## 2021-08-31 ENCOUNTER — Encounter (HOSPITAL_COMMUNITY): Payer: Self-pay | Admitting: Student

## 2021-08-31 ENCOUNTER — Other Ambulatory Visit (HOSPITAL_COMMUNITY): Payer: Self-pay

## 2021-08-31 ENCOUNTER — Inpatient Hospital Stay (HOSPITAL_COMMUNITY): Payer: Medicare PPO

## 2021-08-31 DIAGNOSIS — Z8249 Family history of ischemic heart disease and other diseases of the circulatory system: Secondary | ICD-10-CM | POA: Diagnosis not present

## 2021-08-31 DIAGNOSIS — Z823 Family history of stroke: Secondary | ICD-10-CM | POA: Diagnosis not present

## 2021-08-31 DIAGNOSIS — I452 Bifascicular block: Secondary | ICD-10-CM | POA: Diagnosis present

## 2021-08-31 DIAGNOSIS — T796XXA Traumatic ischemia of muscle, initial encounter: Secondary | ICD-10-CM | POA: Diagnosis not present

## 2021-08-31 DIAGNOSIS — I1310 Hypertensive heart and chronic kidney disease without heart failure, with stage 1 through stage 4 chronic kidney disease, or unspecified chronic kidney disease: Secondary | ICD-10-CM | POA: Diagnosis present

## 2021-08-31 DIAGNOSIS — I421 Obstructive hypertrophic cardiomyopathy: Secondary | ICD-10-CM | POA: Diagnosis present

## 2021-08-31 DIAGNOSIS — N182 Chronic kidney disease, stage 2 (mild): Secondary | ICD-10-CM | POA: Diagnosis present

## 2021-08-31 DIAGNOSIS — I422 Other hypertrophic cardiomyopathy: Secondary | ICD-10-CM | POA: Diagnosis present

## 2021-08-31 DIAGNOSIS — E860 Dehydration: Secondary | ICD-10-CM | POA: Diagnosis present

## 2021-08-31 DIAGNOSIS — I10 Essential (primary) hypertension: Secondary | ICD-10-CM | POA: Diagnosis not present

## 2021-08-31 DIAGNOSIS — Z853 Personal history of malignant neoplasm of breast: Secondary | ICD-10-CM | POA: Diagnosis not present

## 2021-08-31 DIAGNOSIS — K219 Gastro-esophageal reflux disease without esophagitis: Secondary | ICD-10-CM | POA: Diagnosis present

## 2021-08-31 DIAGNOSIS — F419 Anxiety disorder, unspecified: Secondary | ICD-10-CM | POA: Diagnosis present

## 2021-08-31 DIAGNOSIS — I248 Other forms of acute ischemic heart disease: Secondary | ICD-10-CM | POA: Diagnosis present

## 2021-08-31 DIAGNOSIS — I480 Paroxysmal atrial fibrillation: Secondary | ICD-10-CM | POA: Diagnosis not present

## 2021-08-31 DIAGNOSIS — Z79899 Other long term (current) drug therapy: Secondary | ICD-10-CM | POA: Diagnosis not present

## 2021-08-31 DIAGNOSIS — R0609 Other forms of dyspnea: Secondary | ICD-10-CM | POA: Diagnosis not present

## 2021-08-31 DIAGNOSIS — R778 Other specified abnormalities of plasma proteins: Secondary | ICD-10-CM | POA: Diagnosis present

## 2021-08-31 DIAGNOSIS — E876 Hypokalemia: Secondary | ICD-10-CM | POA: Diagnosis present

## 2021-08-31 DIAGNOSIS — I959 Hypotension, unspecified: Secondary | ICD-10-CM | POA: Diagnosis present

## 2021-08-31 DIAGNOSIS — R531 Weakness: Secondary | ICD-10-CM | POA: Diagnosis not present

## 2021-08-31 DIAGNOSIS — Z803 Family history of malignant neoplasm of breast: Secondary | ICD-10-CM | POA: Diagnosis not present

## 2021-08-31 DIAGNOSIS — F339 Major depressive disorder, recurrent, unspecified: Secondary | ICD-10-CM | POA: Diagnosis present

## 2021-08-31 DIAGNOSIS — M6282 Rhabdomyolysis: Secondary | ICD-10-CM | POA: Diagnosis present

## 2021-08-31 DIAGNOSIS — E785 Hyperlipidemia, unspecified: Secondary | ICD-10-CM | POA: Diagnosis present

## 2021-08-31 DIAGNOSIS — Z20822 Contact with and (suspected) exposure to covid-19: Secondary | ICD-10-CM | POA: Diagnosis present

## 2021-08-31 DIAGNOSIS — R0600 Dyspnea, unspecified: Secondary | ICD-10-CM

## 2021-08-31 DIAGNOSIS — M810 Age-related osteoporosis without current pathological fracture: Secondary | ICD-10-CM | POA: Diagnosis present

## 2021-08-31 DIAGNOSIS — J4540 Moderate persistent asthma, uncomplicated: Secondary | ICD-10-CM | POA: Diagnosis present

## 2021-08-31 LAB — SEDIMENTATION RATE: Sed Rate: 53 mm/hr — ABNORMAL HIGH (ref 0–22)

## 2021-08-31 LAB — TROPONIN I (HIGH SENSITIVITY)
Troponin I (High Sensitivity): 32 ng/L — ABNORMAL HIGH (ref <18)
Troponin I (High Sensitivity): 36 ng/L — ABNORMAL HIGH (ref <18)

## 2021-08-31 LAB — BRAIN NATRIURETIC PEPTIDE: B Natriuretic Peptide: 655.8 pg/mL — ABNORMAL HIGH (ref 0.0–100.0)

## 2021-08-31 LAB — IRON AND TIBC
Iron: 19 ug/dL — ABNORMAL LOW (ref 28–170)
Saturation Ratios: 6 % — ABNORMAL LOW (ref 10.4–31.8)
TIBC: 312 ug/dL (ref 250–450)
UIBC: 293 ug/dL

## 2021-08-31 LAB — COMPREHENSIVE METABOLIC PANEL
ALT: 37 U/L (ref 0–44)
AST: 57 U/L — ABNORMAL HIGH (ref 15–41)
Albumin: 2.9 g/dL — ABNORMAL LOW (ref 3.5–5.0)
Alkaline Phosphatase: 46 U/L (ref 38–126)
Anion gap: 8 (ref 5–15)
BUN: 12 mg/dL (ref 8–23)
CO2: 20 mmol/L — ABNORMAL LOW (ref 22–32)
Calcium: 8.2 mg/dL — ABNORMAL LOW (ref 8.9–10.3)
Chloride: 110 mmol/L (ref 98–111)
Creatinine, Ser: 0.95 mg/dL (ref 0.44–1.00)
GFR, Estimated: >60 mL/min (ref >60)
Glucose, Bld: 86 mg/dL (ref 70–99)
Potassium: 4 mmol/L (ref 3.5–5.1)
Sodium: 138 mmol/L (ref 135–145)
Total Bilirubin: 0.8 mg/dL (ref 0.3–1.2)
Total Protein: 5.5 g/dL — ABNORMAL LOW (ref 6.5–8.1)

## 2021-08-31 LAB — TSH: TSH: 3.744 u[IU]/mL (ref 0.350–4.500)

## 2021-08-31 LAB — RETICULOCYTES
Immature Retic Fract: 24.6 % — ABNORMAL HIGH (ref 2.3–15.9)
RBC.: 3.55 MIL/uL — ABNORMAL LOW (ref 3.87–5.11)
Retic Count, Absolute: 89.1 10*3/uL (ref 19.0–186.0)
Retic Ct Pct: 2.5 % (ref 0.4–3.1)

## 2021-08-31 LAB — C-REACTIVE PROTEIN: CRP: 8.3 mg/dL — ABNORMAL HIGH (ref <1.0)

## 2021-08-31 LAB — FOLATE: Folate: 25.1 ng/mL (ref >5.9)

## 2021-08-31 LAB — CBC
HCT: 35.2 % — ABNORMAL LOW (ref 36.0–46.0)
Hemoglobin: 11.8 g/dL — ABNORMAL LOW (ref 12.0–15.0)
MCH: 32.4 pg (ref 26.0–34.0)
MCHC: 33.5 g/dL (ref 30.0–36.0)
MCV: 96.7 fL (ref 80.0–100.0)
Platelets: 145 10*3/uL — ABNORMAL LOW (ref 150–400)
RBC: 3.64 MIL/uL — ABNORMAL LOW (ref 3.87–5.11)
RDW: 12.8 % (ref 11.5–15.5)
WBC: 5.7 10*3/uL (ref 4.0–10.5)
nRBC: 0 % (ref 0.0–0.2)

## 2021-08-31 LAB — FERRITIN: Ferritin: 227 ng/mL (ref 11–307)

## 2021-08-31 LAB — CK: Total CK: 643 U/L — ABNORMAL HIGH (ref 38–234)

## 2021-08-31 LAB — VITAMIN B12: Vitamin B-12: 184 pg/mL (ref 180–914)

## 2021-08-31 LAB — MAGNESIUM: Magnesium: 2.2 mg/dL (ref 1.7–2.4)

## 2021-08-31 LAB — PHOSPHORUS: Phosphorus: 2 mg/dL — ABNORMAL LOW (ref 2.5–4.6)

## 2021-08-31 MED ORDER — AMIODARONE HCL IN DEXTROSE 360-4.14 MG/200ML-% IV SOLN
30.0000 mg/h | INTRAVENOUS | Status: DC
  Administered 2021-09-01 (×2): 30 mg/h via INTRAVENOUS
  Filled 2021-08-31: qty 200

## 2021-08-31 MED ORDER — SODIUM CHLORIDE 0.9 % IV SOLN: INTRAVENOUS | Status: DC

## 2021-08-31 MED ORDER — AMIODARONE HCL IN DEXTROSE 360-4.14 MG/200ML-% IV SOLN
60.0000 mg/h | INTRAVENOUS | Status: DC
  Administered 2021-08-31 (×2): 60 mg/h via INTRAVENOUS
  Filled 2021-08-31 (×2): qty 200

## 2021-08-31 MED ORDER — FUROSEMIDE 10 MG/ML IJ SOLN
INTRAMUSCULAR | Status: AC
  Filled 2021-08-31: qty 4

## 2021-08-31 MED ORDER — ENOXAPARIN SODIUM 80 MG/0.8ML IJ SOSY
1.0000 mg/kg | PREFILLED_SYRINGE | Freq: Two times a day (BID) | INTRAMUSCULAR | Status: DC
  Administered 2021-08-31: 70 mg via SUBCUTANEOUS
  Filled 2021-08-31: qty 0.8

## 2021-08-31 MED ORDER — TRAMADOL HCL 50 MG PO TABS
50.0000 mg | ORAL_TABLET | Freq: Two times a day (BID) | ORAL | Status: DC | PRN
  Administered 2021-08-31 – 2021-09-02 (×4): 50 mg via ORAL
  Filled 2021-08-31 (×4): qty 1

## 2021-08-31 MED ORDER — FUROSEMIDE 10 MG/ML IJ SOLN
40.0000 mg | Freq: Every day | INTRAMUSCULAR | Status: DC
  Administered 2021-08-31: 40 mg via INTRAVENOUS

## 2021-08-31 MED ORDER — DILTIAZEM HCL-DEXTROSE 125-5 MG/125ML-% IV SOLN (PREMIX)
5.0000 mg/h | INTRAVENOUS | Status: DC
  Administered 2021-08-31: 5 mg/h via INTRAVENOUS
  Filled 2021-08-31: qty 125

## 2021-08-31 MED ORDER — BUPROPION HCL ER (SR) 150 MG PO TB12
150.0000 mg | ORAL_TABLET | Freq: Every day | ORAL | Status: DC
  Administered 2021-08-31 – 2021-09-03 (×4): 150 mg via ORAL
  Filled 2021-08-31 (×4): qty 1

## 2021-08-31 MED ORDER — DILTIAZEM HCL 25 MG/5ML IV SOLN
10.0000 mg | Freq: Once | INTRAVENOUS | Status: AC
  Administered 2021-08-31: 10 mg via INTRAVENOUS
  Filled 2021-08-31: qty 5

## 2021-08-31 MED ORDER — HEPARIN (PORCINE) 25000 UT/250ML-% IV SOLN
1050.0000 [IU]/h | INTRAVENOUS | Status: DC
  Administered 2021-09-01: 900 [IU]/h via INTRAVENOUS
  Administered 2021-09-02: 1050 [IU]/h via INTRAVENOUS
  Filled 2021-08-31 (×2): qty 250

## 2021-08-31 MED ORDER — LEVALBUTEROL HCL 0.63 MG/3ML IN NEBU: 0.6300 mg | INHALATION_SOLUTION | Freq: Four times a day (QID) | RESPIRATORY_TRACT | Status: DC | PRN

## 2021-08-31 MED ORDER — AMIODARONE LOAD VIA INFUSION
150.0000 mg | Freq: Once | INTRAVENOUS | Status: AC
  Administered 2021-08-31: 150 mg via INTRAVENOUS
  Filled 2021-08-31: qty 83.34

## 2021-08-31 MED ORDER — K PHOS MONO-SOD PHOS DI & MONO 155-852-130 MG PO TABS
250.0000 mg | ORAL_TABLET | Freq: Two times a day (BID) | ORAL | Status: DC
Start: 2021-08-31 — End: 2021-09-01
  Administered 2021-08-31 (×2): 250 mg via ORAL
  Filled 2021-08-31 (×2): qty 1

## 2021-08-31 MED ORDER — NITROGLYCERIN 0.4 MG SL SUBL: 0.4000 mg | SUBLINGUAL_TABLET | SUBLINGUAL | Status: DC | PRN

## 2021-08-31 MED ORDER — SODIUM CHLORIDE 0.9 % IV SOLN
250.0000 mg | Freq: Once | INTRAVENOUS | Status: DC
  Filled 2021-08-31: qty 20

## 2021-08-31 NOTE — Plan of Care (Signed)
  Problem: Health Behavior/Discharge Planning: Goal: Ability to manage health-related needs will improve Outcome: Progressing   Problem: Clinical Measurements: Goal: Ability to maintain clinical measurements within normal limits will improve Outcome: Progressing Goal: Will remain free from infection Outcome: Progressing Goal: Cardiovascular complication will be avoided Outcome: Progressing   

## 2021-08-31 NOTE — Progress Notes (Addendum)
ANTICOAGULATION CONSULT NOTE - Initial Consult  Pharmacy Consult for Enoxaparin>>heparin Indication: atrial fibrillation  Allergies  Allergen Reactions   Cefaclor Itching    Other reaction(s): Flushing (ALLERGY/intolerance)   Codeine Itching and Nausea And Vomiting   Fructose Swelling    Swelling and GI upset, Fructose Granules    Sulfa Antibiotics Swelling   Meperidine Nausea And Vomiting and Itching   Elemental Sulfur Swelling    Patient Measurements: Height: 5' (152.4 cm) Weight: 68.8 kg (151 lb 10.8 oz) IBW/kg (Calculated) : 45.5 Heparin Dosing Weight: 60.5 kg   Vital Signs: Temp: 98.5 F (36.9 C) (06/16 0713) Temp Source: Oral (06/16 0713) BP: 139/99 (06/16 1359) Pulse Rate: 123 (06/16 1359)  Labs: Recent Labs    08/29/21 1453 08/29/21 1508 08/29/21 1643 08/30/21 0421 08/31/21 0343  HGB 13.8  --   --  10.9* 11.8*  HCT 39.6  --   --  32.6* 35.2*  PLT 198  --   --  151 145*  CREATININE 1.03*  --   --  1.30* 0.95  CKTOTAL 151  --   --  587* 643*  TROPONINIHS  --  33* 36* 43*  --     Estimated Creatinine Clearance: 41.5 mL/min (by C-G formula based on SCr of 0.95 mg/dL).   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 Possibly aggravated by Tamoxifen   Fibrocystic breast changes 07/08/2011   Fracture, [place], left, closed 05/03/2021   GERD (gastroesophageal reflux disease) 02/06/2016   Heart murmur, systolic 36/03/4429   Overview:  Overview:  Mild MR/TR, echo 2007 without change since 2001   Hyperlipidemia LDL goal <70 12/16/2013   Hyperlipidemia, unspecified 12/16/2013   Hypertension    Hypertensive heart disease 04/30/2018   Hypertrophic cardiomyopathy ([place]) 04/30/2018   Hypokalemia 09/27/2020   Infection due to Staphylococcus epidermidis 09/27/2020   Irritable bowel syndrome 12/16/2013   LBBB (left bundle branch block) 5/40/0867   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   Major depressive disorder, recurrent, unspecified ([place]) 12/16/2013   Mitral regurgitation 04/30/2018   Moderate persistent asthma 09/28/2015   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 Pharmacy consulted for enoxaparin for Afib. No anticoagulation prior to admission. Hgb 11.8. Plts 145.   Addendum  Cards decided to change Lovenox to IV heparin. We will start heparin in AM since pt has received Lovenox this PM.   Goal of  Therapy:  Heparin level: 0.3-0.7 Monitor platelets by anticoagulation protocol: Yes   Plan:  Dc lovenox Heparin 900 units/hr 8 hr HL tomorrow Monitor plts closely  Monitor renal function, CBC and s/s of bleeding  Follow up transition to oral anticoagulant   Onnie Boer, PharmD, BCIDP, AAHIVP, CPP Infectious Disease Pharmacist 08/31/2021 6:57 PM

## 2021-08-31 NOTE — Consult Note (Signed)
Cardiology Consultation:   Patient ID: Laurie Bowman MRN: [id number]; DOB: 04-Apr-1940  Admit date: 08/29/2021 Date of Consult: 08/31/2021  PCP:  Derrill Center., MD   [facility] Providers Cardiologist:  Shirlee More, MD        Patient Profile:   Laurie Bowman is a 81 y.o. female with a hx of hypertrophic cardiomyopathy who is being seen 08/31/2021 for the evaluation of atrial fibrillation at the request of Dr Cyndia Skeeters.  History of Present Illness:   Laurie Bowman is a mildly overweight married Caucasian female mother of 2 children who his cardiologist is Dr. Bettina Gavia in [place], previously Dr. Wynonia Lawman.  She has a history of osteoporosis, hypertrophic cardiomyopathy with moderate concentric LVH and a septal thickness of 19 mm by 2D echo, chronic left bundle branch block, hypertension.  She had a Reclast infusion on Tuesday and felt poorly after that and was admitted on 614.  Her troponins were flat.  She had sinus rhythm on admission with left bundle branch block.  She is on verapamil.  At around noon today she was noted to be in A-fib with RVR.  She is mildly short of breath.  We were asked to see her for evaluation.  She currently is on IV diltiazem drip at 15 mg an hour which has not really reduced her heart rate.   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 Possibly aggravated by Tamoxifen   Fibrocystic breast changes 07/08/2011   Fracture, [place], left, closed 05/03/2021   GERD (gastroesophageal reflux disease) 02/06/2016   Heart murmur, systolic 22/08/3333   Overview:  Overview:  Mild MR/TR, echo 2007 without change since 2001   Hyperlipidemia LDL goal <70 12/16/2013   Hyperlipidemia, unspecified 12/16/2013   Hypertension    Hypertensive heart disease 04/30/2018   Hypertrophic cardiomyopathy ([place]) 04/30/2018   Hypokalemia 09/27/2020   Infection due to Staphylococcus epidermidis 09/27/2020   Irritable bowel syndrome 12/16/2013   LBBB (left bundle branch block) 4/56/2563   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   Major depressive disorder, recurrent, unspecified ([place]) 12/16/2013   Mitral regurgitation 04/30/2018   Moderate persistent asthma 09/28/2015   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 06/21/2015   Other chest pain 09/27/2020   Other specified disorders of eyelid 11/09/2015   Pain in unspecified knee 12/16/2013   Peripheral visual field defect of both eyes 04/21/2015   PVC's (premature ventricular contractions) 04/30/2018   Rotator cuff tendonitis 12/16/2013  Undiagnosed cardiac murmurs 12/16/2013   Overview:  Mild MR/TR, echo 2007 without change since 2001   Urticaria    Ventricular premature depolarization 12/16/2013   Wound dehiscence 05/08/2015    Past Surgical History:  Procedure Laterality Date   ABDOMINAL ADHESION SURGERY     ADENOIDECTOMY     BREAST SURGERY     carpel tunnel     CESAREAN SECTION     KNEE SURGERY     LEG SURGERY      bone graft   TONSILLECTOMY     VEIN SURGERY     Right Leg     Home Medications:  Prior to Admission medications   Medication Sig Start Date End Date Taking? Authorizing Provider  albuterol (PROAIR HFA) 108 (90 Base) MCG/ACT inhaler Use 1-2 puffs every 4-6 hours as needed for cough or wheeze. 12/05/20  Yes Valentina Shaggy, MD  albuterol (PROVENTIL) (2.5 MG/3ML) 0.083% nebulizer solution Take 3 mLs (2.5 mg total) by nebulization every 6 (six) hours as needed for wheezing or shortness of breath. 06/18/21  Yes Valentina Shaggy, MD  aspirin 81 MG chewable tablet Chew 81 mg by mouth daily.   Yes [provider]  augmented betamethasone dipropionate (DIPROLENE-AF) 0.05 % cream Apply 2.94 application topically daily.   Yes [provider]  benzonatate (TESSALON) 100 MG capsule Take 100 mg by mouth daily as needed for cough. 07/02/18  Yes [provider]  Biotin 1000 MCG tablet Take 1,000 mcg by mouth daily.    Yes [provider]  buPROPion (WELLBUTRIN SR) 150 MG 12 hr tablet Take 150 mg by mouth daily.    Yes [provider]  calcium-vitamin D (OSCAL WITH D) 500-200 MG-UNIT TABS tablet Take 1 tablet by mouth daily.   Yes [provider]  Cholecalciferol (VITAMIN D) 50 MCG (2000 UT) CAPS Take 2,000 Units by mouth daily.   Yes [provider]  clobetasol cream (TEMOVATE) 7.65 % Apply 1 application. topically daily. 03/22/18  Yes [provider]  clotrimazole-betamethasone (LOTRISONE) cream Apply 1 application. topically 2 (two) times daily. 03/16/19  Yes [provider]  diclofenac Sodium (VOLTAREN) 1 % GEL Apply 2 g topically 4 (four) times daily. Both knees 04/16/19  Yes [provider]  dicyclomine (BENTYL) 20 MG tablet Take 20 mg by mouth daily as needed for spasms. 04/14/18  Yes [provider]  fexofenadine (ALLEGRA) 180 MG tablet Take 180 mg by mouth daily.    Yes [provider]   fluocinonide cream (LIDEX) 4.65 % Apply 1 application topically 2 (two) times daily.    Yes [provider]  fluticasone (FLOVENT HFA) 110 MCG/ACT inhaler Inhale 2 puffs into the lungs 2 (two) times daily. Use with spacer. Patient taking differently: Inhale 2 puffs into the lungs 2 (two) times daily as needed (shortness of breath and wheezing). 07/10/21  Yes Valentina Shaggy, MD  hydrochlorothiazide (HYDRODIURIL) 12.5 MG tablet Take 12.5-25 mg by mouth daily. 08/14/21  Yes [provider]  lansoprazole (PREVACID) 30 MG capsule Take 1 capsule (30 mg total) by mouth 2 (two) times daily before a meal. 07/10/21  Yes Valentina Shaggy, MD  montelukast (SINGULAIR) 10 MG tablet TAKE 1 TABLET BY MOUTH EVERYDAY AT BEDTIME Patient taking differently: Take 10 mg by mouth at bedtime. 07/10/21  Yes Valentina Shaggy, MD  olopatadine (PATANOL) 0.1 % ophthalmic solution Place 1 drop into both eyes 2 (two) times daily as needed. 04/18/20  Yes Valentina Shaggy,  MD  Polyethylene Glycol 3350 (PEG 3350) POWD Take 17 g by mouth daily.  06/24/16  Yes [provider]  potassium chloride (KLOR-CON) 10 MEQ tablet Take 10 mEq by mouth 2 (two) times daily. 04/21/15  Yes [provider]  rosuvastatin (CRESTOR) 10 MG tablet Take 10 mg by mouth daily. 07/13/21  Yes [provider]  tiZANidine (ZANAFLEX) 4 MG tablet Take 4 mg by mouth at bedtime as needed for muscle spasms. 11/16/19  Yes [provider]  triamcinolone cream (KENALOG) 0.1 % Apply 1 application. topically 2 (two) times daily. 12/14/18  Yes [provider]  verapamil (CALAN-SR) 180 MG CR tablet Take 1 tablet (180 mg total) by mouth daily. 07/19/21  Yes Richardo Priest, MD  alendronate (FOSAMAX) 70 MG tablet Take 70 mg by mouth once a week. Patient not taking: Reported on 08/30/2021 08/25/21   [provider]  calcium carbonate (OS-CAL) 600 MG TABS tablet Take 1 tablet by mouth 2 (two) times  daily.  Patient not taking: Reported on 08/30/2021    [provider]  traMADol (ULTRAM) 50 MG tablet Take 50 mg by mouth as needed for pain. Patient not taking: Reported on 08/30/2021 05/17/21   [provider]    Inpatient Medications: Scheduled Meds:  amiodarone  150 mg Intravenous Once   aspirin  81 mg Oral Daily   budesonide (PULMICORT) nebulizer solution  0.25 mg Nebulization BID   buPROPion  150 mg Oral Daily   calcium-vitamin D  1 tablet Oral Q breakfast   enoxaparin (LOVENOX) injection  1 mg/kg Subcutaneous Q12H   furosemide  40 mg Intravenous Daily   pantoprazole  40 mg Oral Daily   phosphorus  250 mg Oral BID   sodium chloride flush  3 mL Intravenous Q12H   Continuous Infusions:  sodium chloride 10 mL/hr at 08/31/21 1843   amiodarone     Followed by   Derrill Memo ON 09/01/2021] amiodarone     diltiazem (CARDIZEM) infusion 20 mg/hr (08/31/21 1843)   ferric gluconate (FERRLECIT) IVPB     PRN Meds: acetaminophen **OR** acetaminophen, dicyclomine, levalbuterol, nitroGLYCERIN, polyethylene glycol, traMADol  Allergies:    Allergies  Allergen Reactions   Cefaclor Itching    Other reaction(s): Flushing (ALLERGY/intolerance)   Codeine Itching and Nausea And Vomiting   Fructose Swelling    Swelling and GI upset, Fructose Granules    Sulfa Antibiotics Swelling   Meperidine Nausea And Vomiting and Itching   Elemental Sulfur Swelling    Social History:   Social History   Socioeconomic History   Marital status: Married    Spouse name: Not on file   Number of children: Not on file   Years of education: Not on file   Highest education level: Not on file  Occupational History   Not on file  Tobacco Use   Smoking status: Former    Types: Cigarettes    Passive exposure: Past   Smokeless tobacco: Never  Vaping Use   Vaping Use: Never used  Substance and Sexual Activity   Alcohol use: No    Alcohol/week: 0.0 standard drinks of alcohol   Drug use: No    Sexual activity: Never  Other Topics Concern   Not on file  Social History Narrative   Not on file   Social Determinants of Health   Financial Resource Strain: Not on file  Food Insecurity: Not on file  Transportation Needs: Not on file  Physical Activity: Not on file  Stress: Not  on file  Social Connections: Not on file  Intimate Partner Violence: Not on file    Family History:    Family History  Problem Relation Age of Onset   Congestive Heart Failure Mother    Hypertension Mother    Hyperlipidemia Sister    Dementia Sister    Alzheimer's disease Sister    Stroke Maternal Grandmother    Breast cancer Sister    Hyperlipidemia Sister    Allergic rhinitis Neg Hx    Angioedema Neg Hx    Asthma Neg Hx    Eczema Neg Hx    Immunodeficiency Neg Hx    Urticaria Neg Hx    Atopy Neg Hx      ROS:  Please see the history of present illness.   All other ROS reviewed and negative.     Physical Exam/Data:   Vitals:   08/31/21 1730 08/31/21 1745 08/31/21 1824 08/31/21 1825  BP: (!) 117/97 (!) 138/106 (!) 145/80 (!) 146/94  Pulse:   74 95  Resp: (!) 31 (!) 22 (!) 21 20  Temp:      TempSrc:      SpO2:   99% 100%  Weight:      Height:        Intake/Output Summary (Last 24 hours) at 08/31/2021 1849 Last data filed at 08/31/2021 1843 Gross per 24 hour  Intake 933.5 ml  Output 3175 ml  Net -2241.5 ml      08/31/2021    1:21 AM 08/30/2021    2:45 PM 08/29/2021    2:27 PM  Last 3 Weights  Weight (lbs) 151 lb 10.8 oz 151 lb 10.8 oz 146 lb  Weight (kg) 68.8 kg 68.8 kg 66.225 kg     Body mass index is 29.62 kg/m.  General:  Well nourished, well developed, in no acute distress HEENT: normal Neck: no JVD Vascular: No carotid bruits; Distal pulses 2+ bilaterally Cardiac:   Heart rate is tachycardic, irregularly irregular with an apical check murmur. Lungs:  clear to auscultation bilaterally, no wheezing, rhonchi or rales  Abd: soft, nontender, no hepatomegaly  Ext: no  edema Musculoskeletal:  No deformities, BUE and BLE strength normal and equal Skin: warm and dry  Neuro:  CNs 2-12 intact, no focal abnormalities noted Psych:  Normal affect   EKG:  The EKG was personally reviewed and demonstrates: Not performed today Telemetry:  Telemetry was personally reviewed and demonstrates: Atrial fibrillation with rapid ventricular response  Relevant CV Studies: None  Laboratory Data:  High Sensitivity Troponin:   Recent Labs  Lab 08/29/21 1508 08/29/21 1643 08/30/21 0421 08/31/21 1509 08/31/21 1638  TROPONINIHS 33* 36* 43* 32* 36*     Chemistry Recent Labs  Lab 08/29/21 1453 08/30/21 0421 08/31/21 0343  NA 136 134* 138  K 3.0* 3.6 4.0  CL 102 104 110  CO2 25 21* 20*  GLUCOSE 103* 93 86  BUN '11 19 12  '$ CREATININE 1.03* 1.30* 0.95  CALCIUM 9.9 8.5* 8.2*  MG  --  1.6* 2.2  GFRNONAA 55* 42* >60  ANIONGAP '9 9 8    '$ Recent Labs  Lab 08/29/21 1453 08/30/21 0421 08/31/21 0343  PROT 6.6 5.2* 5.5*  ALBUMIN 3.7 3.0* 2.9*  AST 50* 58* 57*  ALT 39 38 37  ALKPHOS 51 42 46  BILITOT 1.4* 1.0 0.8   Lipids No results for input(s): "CHOL", "TRIG", "HDL", "LABVLDL", "LDLCALC", "CHOLHDL" in the last 168 hours.  Hematology Recent Labs  Lab 08/29/21 1453 08/30/21  0421 08/31/21 0343 08/31/21 0345  WBC 9.4 6.1 5.7  --   RBC 4.18 3.36* 3.64* 3.55*  HGB 13.8 10.9* 11.8*  --   HCT 39.6 32.6* 35.2*  --   MCV 94.7 97.0 96.7  --   MCH 33.0 32.4 32.4  --   MCHC 34.8 33.4 33.5  --   RDW 12.6 12.7 12.8  --   PLT 198 151 145*  --    Thyroid  Recent Labs  Lab 08/31/21 1638  TSH 3.744    BNP Recent Labs  Lab 08/31/21 1509  BNP 655.8*    DDimer No results for input(s): "DDIMER" in the last 168 hours.   Radiology/Studies:  DG Chest Port 1 View  Result Date: 08/31/2021 CLINICAL DATA:  Dyspnea. EXAM: PORTABLE CHEST 1 VIEW COMPARISON:  Radiographs 08/29/2021 and 02/12/2021.  CT 09/17/2020. FINDINGS: 1508 hours. Stable cardiomegaly and aortic  atherosclerosis. Stable mild chronic interstitial prominence without evidence of superimposed edema, confluent airspace opacity, pneumothorax or significant pleural effusion. The bones appear unchanged. No acute osseous findings are evident. IMPRESSION: Stable cardiomegaly and chronic interstitial lung disease. No acute process identified. Electronically Signed   By: Richardean Sale M.D.   On: 08/31/2021 15:23   DG Chest Portable 1 View  Result Date: 08/29/2021 CLINICAL DATA:  General weakness EXAM: PORTABLE CHEST 1 VIEW COMPARISON:  Radiograph 02/12/2021, chest CT 09/17/2020 FINDINGS: Unchanged enlarged cardiac silhouette. There is mild interstitial prominence bilaterally, similar to prior exams. There is no focal airspace consolidation. There is no pleural effusion. No pneumothorax. No acute osseous abnormality. IMPRESSION: Cardiomegaly with similar chronic interstitial changes bilaterally. No new airspace disease. Electronically Signed   By: Maurine Simmering M.D.   On: 08/29/2021 14:57     Assessment and Plan:   A-fib with RVR-no prior history of A-fib.  She does have HOCM on verapamil but has been asymptomatic from this.  She currently is on diltiazem at 15 mg an hour which has not affected her heart rate.  2D echo was ordered but has not yet to be performed.  Her lungs are clear on exam and she has no peripheral edema.  I am going to start her on IV heparin and give her an amiodarone load and infusion.  If she does not convert on amiodarone she will need DC cardioversion.   Risk Assessment/Risk Scores:          CHA2DS2-VASc Score =     This indicates a  % annual risk of stroke. The patient's score is based upon:           For questions or updates, please contact [place] Please consult [email or web address] for contact info under    Signed, Quay Burow, MD  08/31/2021 6:49 PM

## 2021-08-31 NOTE — Progress Notes (Signed)
Physical Therapy Treatment Patient Details Name: Laurie Bowman MRN: [id number] DOB: 03-24-1940 Today's Date: 08/31/2021   History of Present Illness Pt is a 81 y.o. female adm 6/14 after fall at home. Pt had recevied osteoposis infusion earlier and then developed body aches and weakness. Weakness thought likely due to Reclast infusion.  PMHx: of osteoporosis, asthma, GERD, hypertension, CKD 2, alopecia, hyperlipidemia, IBS, depression, breast cancer.    PT Comments    Pt supine in bed.  Pt is easily distracted and very concerned about the swelling in her R forearm.  Pt educated to keep arm elevated when not moving.  Encouraged movement with R arm but she refuses.  Pt required cues for pacing and safety.  She reports she has a RW at home but would benefit from youth height RW for improved fit at d/c.      Recommendations for follow up therapy are one component of a multi-disciplinary discharge planning process, led by the attending physician.  Recommendations may be updated based on patient status, additional functional criteria and insurance authorization.  Follow Up Recommendations  Home health PT     Assistance Recommended at Discharge Intermittent Supervision/Assistance  Patient can return home with the following A little help with walking and/or transfers;Help with stairs or ramp for entrance   Equipment Recommendations  None recommended by PT would benefit from a youth height RW at d/c to better fit per her height.    Recommendations for Other Services       Precautions / Restrictions Precautions Precautions: Fall     Mobility  Bed Mobility Overal bed mobility: Needs Assistance Bed Mobility: Supine to Sit, Sit to Supine       Sit to supine: Supervision   General bed mobility comments: Extreme increased time and effort to move to edge of bed. Refused to use R arm due to pain.    Transfers Overall transfer level: Needs assistance Equipment used: Rolling walker (2  wheels) Transfers: Sit to/from Stand Sit to Stand: Min guard           General transfer comment: Cues for hand placement to push into standing.  Cues for hand placement to reach back for seated surface.  Pt with poor safety back to surface before sitting.    Ambulation/Gait Ambulation/Gait assistance: Min guard Gait Distance (Feet): 150 Feet Assistive device: Rolling walker (2 wheels) Gait Pattern/deviations: Step-through pattern, Decreased stride length, Drifts right/left Gait velocity: decr     General Gait Details: Assist for balance and safety.  Cues for RW safety, position in RW and negotiating obstacles in halls.  Pt continues to require cues for pacing.  Easily distracted with conversation during gt training.  Would benefit from a youth height RW at d/c.   Stairs             Wheelchair Mobility    Modified Rankin (Stroke Patients Only)       Balance Overall balance assessment: Needs assistance Sitting-balance support: No upper extremity supported, Feet supported Sitting balance-Leahy Scale: Good       Standing balance-Leahy Scale: Fair                              Cognition Arousal/Alertness: Awake/alert Behavior During Therapy: WFL for tasks assessed/performed Overall Cognitive Status: Within Functional Limits for tasks assessed  Exercises      General Comments        Pertinent Vitals/Pain Pain Assessment Pain Assessment: Faces Faces Pain Scale: Hurts even more Pain Location: "all over" with movement < R lower arm Pain Descriptors / Indicators: Grimacing, Sore Pain Intervention(s): Monitored during session, Repositioned    Home Living                          Prior Function            PT Goals (current goals can now be found in the care plan section) Acute Rehab PT Goals Patient Stated Goal: return home Potential to Achieve Goals: Good Progress towards  PT goals: Progressing toward goals    Frequency    Min 3X/week      PT Plan Current plan remains appropriate    Co-evaluation              AM-PAC PT "6 Clicks" Mobility   Outcome Measure  Help needed turning from your back to your side while in a flat bed without using bedrails?: A Lot Help needed moving from lying on your back to sitting on the side of a flat bed without using bedrails?: A Little Help needed moving to and from a bed to a chair (including a wheelchair)?: A Little Help needed standing up from a chair using your arms (e.g., wheelchair or bedside chair)?: A Little Help needed to walk in hospital room?: A Little Help needed climbing 3-5 steps with a railing? : A Little 6 Click Score: 17    End of Session Equipment Utilized During Treatment: Gait belt Activity Tolerance: Patient limited by fatigue Patient left: in chair;with call bell/phone within reach;with chair alarm set;with family/visitor present Nurse Communication: Mobility status PT Visit Diagnosis: Unsteadiness on feet (R26.81);Muscle weakness (generalized) (M62.81)     Time: 7510-2585 PT Time Calculation (min) (ACUTE ONLY): 27 min  Charges:  $Gait Training: 8-22 mins $Therapeutic Activity: 8-22 mins                     Mylin Gignac R. , PTA Acute Rehabilitation Services Office [phone]    Shelly Shoultz Eli Hose 08/31/2021, 10:50 AM

## 2021-08-31 NOTE — Progress Notes (Signed)
Occupational Therapy Treatment Patient Details Name: Laurie Bowman MRN: [id number] DOB: 19-Dec-1940 Today's Date: 08/31/2021   History of present illness Pt is a 81 y.o. female adm 6/14 after fall at home. Pt had recevied osteoposis infusion earlier and then developed body aches and weakness. Weakness thought likely due to Reclast infusion.  PMHx: of osteoporosis, asthma, GERD, hypertension, CKD 2, alopecia, hyperlipidemia, IBS, depression, breast cancer.   OT comments   Pt education received on energy conservation due to fatigue and pt had just gotten up with nursing to bathroom and pt was finally settled without pain. Pt continues to have pain in RUE at forearm d/t bloodwork- placed on pillow and elevated. Pt understanding conservation techniques and able to state ways at home to use strategies.Pt continues to benefit from OT skilled services. OT following acutely.   Recommendations for follow up therapy are one component of a multi-disciplinary discharge planning process, led by the attending physician.  Recommendations may be updated based on patient status, additional functional criteria and insurance authorization.    Follow Up Recommendations  Home health OT    Assistance Recommended at Discharge Set up Supervision/Assistance  Patient can return home with the following  A little help with walking and/or transfers;A little help with bathing/dressing/bathroom   Equipment Recommendations  BSC/3in1    Recommendations for Other Services      Precautions / Restrictions Precautions Precautions: Fall       Mobility Bed Mobility Overal bed mobility: Needs Assistance Bed Mobility: Rolling Rolling: Supervision              Transfers                   General transfer comment: deferred due to fatigue today     Balance                                           ADL either performed or assessed with clinical judgement   ADL Overall ADL's : Needs  assistance/impaired                                     Functional mobility during ADLs: Min guard;Cueing for safety General ADL Comments: Pt education received on energy conservation duet o fatigue and pt had just gotten up with nursing to bathroom and pt was finally settled without pain. Pt continues to have pain in RUE at forearm d/t bloodwork- placed on pillow and elevated. Pt understanding conservation techniques and able to state ways at home to use strategies.    Extremity/Trunk Assessment Upper Extremity Assessment Upper Extremity Assessment: RUE deficits/detail RUE Deficits / Details: soreness from IV placement and bloodwork RUE Coordination: decreased fine motor   Lower Extremity Assessment Lower Extremity Assessment: Defer to PT evaluation        Vision   Vision Assessment?: No apparent visual deficits   Perception     Praxis      Cognition Arousal/Alertness: Lethargic Behavior During Therapy: WFL for tasks assessed/performed Overall Cognitive Status: Within Functional Limits for tasks assessed                                 General Comments: answering all questions but sleepy  Exercises      Shoulder Instructions       General Comments VSS.    Pertinent Vitals/ Pain       Pain Assessment Pain Assessment: 0-10 Pain Score: 6  Pain Location: "all over" with movement < R lower arm Pain Descriptors / Indicators: Grimacing, Sore Pain Intervention(s): Repositioned, Monitored during session  Home Living                                          Prior Functioning/Environment              Frequency  Min 2X/week        Progress Toward Goals  OT Goals(current goals can now be found in the care plan section)  Progress towards OT goals: Progressing toward goals  Acute Rehab OT Goals Patient Stated Goal: to get better OT Goal Formulation: With patient Time For Goal Achievement:  09/13/21 Potential to Achieve Goals: Good  Plan Discharge plan remains appropriate    Co-evaluation                 AM-PAC OT "6 Clicks" Daily Activity     Outcome Measure   Help from another person eating meals?: None Help from another person taking care of personal grooming?: None Help from another person toileting, which includes using toliet, bedpan, or urinal?: A Little Help from another person bathing (including washing, rinsing, drying)?: A Little Help from another person to put on and taking off regular upper body clothing?: None Help from another person to put on and taking off regular lower body clothing?: A Little 6 Click Score: 21    End of Session    OT Visit Diagnosis: Unsteadiness on feet (R26.81)   Activity Tolerance Patient limited by fatigue   Patient Left in bed;with call bell/phone within reach   Nurse Communication Mobility status        Time: 3817-7116 OT Time Calculation (min): 15 min  Charges: OT General Charges $OT Visit: 1 Visit OT Treatments $Therapeutic Activity: 8-22 mins  Jefferey Pica, OTR/L Acute Rehabilitation Services Office: [phone]  Audie Pinto 08/31/2021, 2:27 PM

## 2021-08-31 NOTE — Progress Notes (Signed)
Echo attempted. While setting up for echo, patient's heart rate jumped to 200 bpm. Nurse notified. Will attempt echo again when heart rate allows for accurate test.

## 2021-08-31 NOTE — Progress Notes (Signed)
Mobility Specialist Progress Note:   08/31/21 1749  Mobility  Activity Contraindicated/medical hold   Pt with high HR. Will follow-up as time allows.   [facility] Terri Rorrer Mobility Specialist

## 2021-08-31 NOTE — Progress Notes (Signed)
   08/31/21 1251  Assess: MEWS Score  BP 124/75  MAP (mmHg) 86  Pulse Rate (!) 130  ECG Heart Rate (!) 138  Resp (!) 27  Level of Consciousness Alert  SpO2 95 %  O2 Device Room Air  Assess: MEWS Score  MEWS Temp 0  MEWS Systolic 0  MEWS Pulse 3  MEWS RR 2  MEWS LOC 0  MEWS Score 5  MEWS Score Color Red  Assess: if the MEWS score is Yellow or Red  Were vital signs taken at a resting state? Yes  Focused Assessment Change from prior assessment (see assessment flowsheet)  Does the patient meet 2 or more of the SIRS criteria? Yes  Does the patient have a confirmed or suspected source of infection? No  MEWS guidelines implemented *See Row Information* Yes  Treat  MEWS Interventions Escalated (See documentation below)  Pain Scale 0-10  Pain Score 2  Take Vital Signs  Increase Vital Sign Frequency  Red: Q 1hr X 4 then Q 4hr X 4, if remains red, continue Q 4hrs  Escalate  MEWS: Escalate Red: discuss with charge nurse/RN and provider, consider discussing with RRT  Notify: Charge Nurse/RN  Name of Charge Nurse/RN Notified [place]  Date Charge Nurse/RN Notified 08/31/21  Time Charge Nurse/RN Notified 1300  Notify: Provider  Provider Name/Title Dr. Cyndia Skeeters  Date Provider Notified 08/31/21  Time Provider Notified 1300  Method of Notification Page  Notification Reason Change in status  Provider response See new orders  Date of Provider Response 08/31/21  Time of Provider Response 1304  Assess: SIRS CRITERIA  SIRS Temperature  0  SIRS Pulse 1  SIRS Respirations  1  SIRS WBC 0  SIRS Score Sum  2

## 2021-08-31 NOTE — Significant Event (Signed)
Rapid Response Event Note   Reason for Call :  Rapid AF  Initial Focused Assessment:  Patient is alert and oriented, lying in bed.  Her arms are very tender.  She is mildly short of breath. She denies chest pain. Lung sounds clear, heart tones irregular  BP 145/80  AF 120-170  RR 20  O2 sat 99% on [place].  Interventions:  Cardiology consult:  Dr Gwenlyn Found at bedside Increase Cardizem gtt to '20mg'$  /hr Amiodarone bolus '150mg'$  and gtt started.  Plan of Care:  Heparin gtt per pharmacy Titrate Cardizem off as HR improves   Event Summary:   MD Notified: Dr Cyndia Skeeters consulted cardiology prior to my arrival Call Time: [place] Time: [place] Time: 1915  Raliegh Ip, RN

## 2021-08-31 NOTE — Progress Notes (Signed)
   08/31/21 1400  Assess: MEWS Score  BP (!) 139/99  MAP (mmHg) 111  Pulse Rate (!) 110  ECG Heart Rate (!) 116  Resp (!) 30  SpO2 98 %  Assess: MEWS Score  MEWS Temp 0  MEWS Systolic 0  MEWS Pulse 2  MEWS RR 2  MEWS LOC 0  MEWS Score 4  MEWS Score Color Red  Treat  Pain Scale 0-10  Pain Score 7  Pain Type Acute pain  Pain Location Chest  Pain Orientation Mid  Pain Descriptors / Indicators Pressure  Pain Frequency Constant  Pain Onset Sudden  Patients Stated Pain Goal 2  Pain Intervention(s) MD notified (Comment)  Assess: SIRS CRITERIA  SIRS Temperature  0  SIRS Pulse 1  SIRS Respirations  1  SIRS WBC 0  SIRS Score Sum  2

## 2021-08-31 NOTE — Plan of Care (Signed)
Cardiology consulted and will see patient.

## 2021-08-31 NOTE — Progress Notes (Signed)
PROGRESS NOTE  Laurie Bowman IWP:[id number] DOB: 26-Aug-1940   PCP: Derrill Center., MD  Patient is from: Home.  Independently ambulates at baseline.  DOA: 08/29/2021 LOS: 0  Chief complaints Chief Complaint  Patient presents with   Weakness     Brief Narrative / Interim history: 81 year old F with PMH of osteoporosis, recent left wrist fracture, HOCM, LBBB, heart murmur, asthma, GERD, HTN, anxiety, depression, IBS, benign breast lesion s/p remote lumpectomy presenting with myalgia and generalized weakness hours after Reclast infusion for osteoporosis.  She fell getting out of the bed the morning of admission and wedged between the bed and the nightstand.  She denies hitting her head or loss of consciousness.  She called EMS and brought to ED for further evaluation.  In ED, stable vitals.  K3.0. Cr 1.03 (baseline).  T. bili 1.4.  CK1 51.  WBC 9.4 with left shift.  Troponin 33>> 36.  EKG with LBBB (chronic).  UA negative.  CXR cardiomegaly with similar chronic interstitial changes bilaterally but no acute finding.  Patient was started on potassium supplementation, IV fluid and pain medication, and admitted for generalized weakness and myalgia.  Patient went into A-fib with RVR with some dyspnea.  IV fluid discontinued.  Started on IV Lasix and IV Cardizem.  Subjective: Seen and examined earlier this morning and this afternoon.  Patient felt well earlier this morning but concerned about swelling in her hands and legs.  She went into A-fib with RVR this afternoon.  She reports wheezing and some shortness of breath.  She also felt significant suprapubic discomfort that has improved after she sat on the edge of the bed and urinated.  She is attributing this to holding her urine.  Patient's son and husband at bedside.  Objective: Vitals:   08/31/21 0403 08/31/21 0713 08/31/21 1251 08/31/21 1359  BP: (!) 160/93 (!) 159/84 124/75 (!) 139/99  Pulse:  79 (!) 130 (!) 123  Resp: 15 18 (!) 27 20   Temp: 97.8 F (36.6 C) 98.5 F (36.9 C)    TempSrc: Oral Oral    SpO2:  95% 95% 99%  Weight:      Height:        Examination:  GENERAL: No apparent distress.  Nontoxic. HEENT: MMM.  Vision and hearing grossly intact.  NECK: Supple.  No apparent JVD.  RESP:  No IWOB.  Fair aeration bilaterally.  Bibasilar crackles. CVS: Irregular rhythm.  HR ranges from 110s to 150s.  Heart sounds normal.  ABD/GI/GU: BS+. Abd soft, NTND.  MSK/EXT:  Moves extremities. No apparent deformity. No edema.  SKIN: no apparent skin lesion or wound NEURO: Awake and alert. Oriented appropriately.  No apparent focal neuro deficit. PSYCH: Calm. Normal affect.   Procedures:  None  Microbiology summarized: COVID-19 PCR nonreactive  Assessment and plan: Principal Problem:   Generalized weakness Active Problems:   Moderate persistent asthma   GERD (gastroesophageal reflux disease)   Hypertrophic cardiomyopathy (HCC)   Benign essential hypertension   Hyperlipidemia, unspecified   Irritable bowel syndrome   Anxiety and depression   Hypokalemia   Elevated troponin   Myalgia   Hypomagnesemia   Elevated AST (SGOT)   Rhabdomyolysis   Dehydration   Paroxysmal atrial fibrillation with RVR (HCC)  Generalized weakness/myalgia/arthralgia-likely due to Reclast infusion.  She is also on Crestor. Rhabdomyolysis-could be due to Reclast infusion, starting or fall.  CK rising. -Discontinuing IV in the setting of A-fib with RVR and possible acute CHF -Continue trending CK -Continue holding  Crestor -PT/OT eval  Paroxysmal A-fib with RVR: New onset.  Likely provoked by IV fluid and holding Verapamil in the setting of hypotension.  Did not resolve with IV Cardizem push.  Blood pressure improved this morning.   -IV Lasix 40 mg x 1 -IV Cardizem drip -IV heparin for anticoagulation -Check CXR, BNP and echocardiogram -Continue telemetry monitoring -Cardiology consult if no improvement.  Dyspnea: Could be due to  A-fib with RVR or possible CHF.  She has bibasilar crackles on exam.  Reports extremity swelling although she does not appear to have significant edema on exam.  She is somewhat anxious.  EKG A-fib with RVR and LBBB (chronic). -IV Lasix as above -Check BNP, troponin and chest x-ray. -Strict intake and output, daily weight, renal functions and electrolytes. -Update echocardiogram  Abdominal pain and tenderness: Improved after urination. -Monitor urine output -Intermittent bladder scan  Elevated creatinine/dehydration: Baseline Cr ~1.1.  Recent Labs    08/29/21 1453 08/30/21 0421 08/31/21 0343  BUN '11 19 12  '$ CREATININE 1.03* 1.30* 0.95  -Continue monitoring  Elevated troponin/chronic LBBB: Elevated troponin likely demand ischemia.  -See dyspnea.  Normocytic anemia: Drop in Hgb likely dilutional from hemodilution.  Denies melena hematochezia. Recent Labs    08/29/21 1453 08/30/21 0421 08/31/21 0343  HGB 13.8 10.9* 11.8*  -Continue monitoring -IV ferric gluconate 250 mg x 1    Hypokalemia/hypomagnesemia: Partly due to HCTZ.  Resolved. -Monitor replenish as appropriate  Elevated AST: Mild.  Likely due to elevated CK.  Improved. -Monitor  Mild intermittent asthma: Stable -Change as needed albuterol to Xopenex. -Continue budesonide  GERD -Continue home PPI  Hypertension: Normotensive -On IV Cardizem -Home verapamil and HCTZ on hold  History of HOCM/MVR: Denies dizziness or syncopal episode. -Cardiac meds as above. -Update echocardiogram  Hyperlipidemia -Hold Crestor given myalgia and rhabdomyolysis  Anxiety/depression/IBS: Appears anxious. -Reassurance. -Continue home meds  Recent wrist fracture: Reports improvement -Continue therapy     DVT prophylaxis:    Code Status: Full code Family Communication: None at bedside Level of care: Telemetry Medical Status is: Observation The patient will require care spanning > 2 midnights and should be moved to  inpatient because: New onset A-fib with RVR, dyspnea, ongoing myalgia, generalized weakness and rhabdomyolysis with rising CK   Final disposition: Likely home once medically stable. Consultants:  None  Sch Meds:  Scheduled Meds:  aspirin  81 mg Oral Daily   budesonide (PULMICORT) nebulizer solution  0.25 mg Nebulization BID   buPROPion  150 mg Oral Daily   calcium-vitamin D  1 tablet Oral Q breakfast   furosemide  40 mg Intravenous Daily   pantoprazole  40 mg Oral Daily   phosphorus  250 mg Oral BID   sodium chloride flush  3 mL Intravenous Q12H   Continuous Infusions:  sodium chloride 10 mL/hr at 08/31/21 1429   diltiazem (CARDIZEM) infusion     ferric gluconate (FERRLECIT) IVPB     PRN Meds:.acetaminophen **OR** acetaminophen, dicyclomine, levalbuterol, nitroGLYCERIN, polyethylene glycol  Antimicrobials: Anti-infectives (From admission, onward)    None        I have personally reviewed the following labs and images: CBC: Recent Labs  Lab 08/29/21 1453 08/30/21 0421 08/31/21 0343  WBC 9.4 6.1 5.7  NEUTROABS 8.8*  --   --   HGB 13.8 10.9* 11.8*  HCT 39.6 32.6* 35.2*  MCV 94.7 97.0 96.7  PLT 198 151 145*   BMP &GFR Recent Labs  Lab 08/29/21 1453 08/30/21 0421 08/31/21 0343  NA  136 134* 138  K 3.0* 3.6 4.0  CL 102 104 110  CO2 25 21* 20*  GLUCOSE 103* 93 86  BUN '11 19 12  '$ CREATININE 1.03* 1.30* 0.95  CALCIUM 9.9 8.5* 8.2*  MG  --  1.6* 2.2  PHOS  --   --  2.0*   Estimated Creatinine Clearance: 41.5 mL/min (by C-G formula based on SCr of 0.95 mg/dL). Liver & Pancreas: Recent Labs  Lab 08/29/21 1453 08/30/21 0421 08/31/21 0343  AST 50* 58* 57*  ALT 39 38 37  ALKPHOS 51 42 46  BILITOT 1.4* 1.0 0.8  PROT 6.6 5.2* 5.5*  ALBUMIN 3.7 3.0* 2.9*   No results for input(s): "LIPASE", "AMYLASE" in the last 168 hours. No results for input(s): "AMMONIA" in the last 168 hours. Diabetic: No results for input(s): "HGBA1C" in the last 72 hours. No  results for input(s): "GLUCAP" in the last 168 hours. Cardiac Enzymes: Recent Labs  Lab 08/29/21 1453 08/30/21 0421 08/31/21 0343  CKTOTAL 151 587* 643*   No results for input(s): "PROBNP" in the last 8760 hours. Coagulation Profile: No results for input(s): "INR", "PROTIME" in the last 168 hours. Thyroid Function Tests: No results for input(s): "TSH", "T4TOTAL", "FREET4", "T3FREE", "THYROIDAB" in the last 72 hours. Lipid Profile: No results for input(s): "CHOL", "HDL", "LDLCALC", "TRIG", "CHOLHDL", "LDLDIRECT" in the last 72 hours. Anemia Panel: Recent Labs    08/31/21 0343 08/31/21 0345  VITAMINB12 184  --   FOLATE  --  25.1  FERRITIN 227  --   TIBC 312  --   IRON 19*  --   RETICCTPCT  --  2.5   Urine analysis:    Component Value Date/Time   COLORURINE YELLOW 08/29/2021 1917   APPEARANCEUR CLEAR 08/29/2021 1917   LABSPEC 1.020 08/29/2021 1917   PHURINE 7.0 08/29/2021 1917   GLUCOSEU NEGATIVE 08/29/2021 1917   HGBUR NEGATIVE 08/29/2021 1917   BILIRUBINUR NEGATIVE 08/29/2021 [place] 08/29/2021 1917   PROTEINUR NEGATIVE 08/29/2021 1917   NITRITE NEGATIVE 08/29/2021 1917   LEUKOCYTESUR NEGATIVE 08/29/2021 1917   Sepsis Labs: Invalid input(s): "PROCALCITONIN", "LACTICIDVEN"  Microbiology: Recent Results (from the past 240 hour(s))  SARS Coronavirus 2 by RT PCR (hospital order, performed in [facility] hospital lab) *cepheid single result test* Anterior Nasal Swab     Status: None   Collection Time: 08/29/21  2:37 PM   Specimen: Anterior Nasal Swab  Result Value Ref Range Status   SARS Coronavirus 2 by RT PCR NEGATIVE NEGATIVE Final    Comment: (NOTE) SARS-CoV-2 target nucleic acids are NOT DETECTED.  The SARS-CoV-2 RNA is generally detectable in upper and lower respiratory specimens during the acute phase of infection. The lowest concentration of SARS-CoV-2 viral copies this assay can detect is 250 copies / mL. A negative result does not  preclude SARS-CoV-2 infection and should not be used as the sole basis for treatment or other patient management decisions.  A negative result may occur with improper specimen collection / handling, submission of specimen other than nasopharyngeal swab, presence of viral mutation(s) within the areas targeted by this assay, and inadequate number of viral copies (<250 copies / mL). A negative result must be combined with clinical observations, patient history, and epidemiological information.  Fact Sheet for Patients:   https://www.patel.info/  Fact Sheet for Healthcare Providers: https://hall.com/  This test is not yet approved or  cleared by the [place] FDA and has been authorized for detection and/or diagnosis of SARS-CoV-2 by  FDA under an Emergency Use Authorization (EUA).  This EUA will remain in effect (meaning this test can be used) for the duration of the COVID-19 declaration under Section 564(b)(1) of the Act, 21 U.S.C. section 360bbb-3(b)(1), unless the authorization is terminated or revoked sooner.  Performed at [facility], [place] [address] [place], [place] [address]     Radiology Studies: No results found.    Mario Voong T. [place]  If 7PM-7AM, please contact night-coverage [email or web address] 08/31/2021, 3:00 PM

## 2021-08-31 NOTE — TOC Benefit Eligibility Note (Signed)
Patient Advocate Encounter  Insurance verification completed.    The patient is currently admitted and upon discharge could be taking Eliquis 5 mg.  The current 30 day co-pay is, $40.00.   The patient is currently admitted and upon discharge could be taking Xarelto 20 mg.  The current 30 day co-pay is, $40.00.   The patient is insured through [employer] Part D    Shirleymae Hauth, CPhT Pharmacy Patient Advocate Specialist [facility] Patient Advocate Team Direct Number: [phone]  Fax: [phone]        

## 2021-09-01 ENCOUNTER — Inpatient Hospital Stay (HOSPITAL_COMMUNITY): Payer: Medicare PPO

## 2021-09-01 DIAGNOSIS — R531 Weakness: Secondary | ICD-10-CM | POA: Diagnosis not present

## 2021-09-01 DIAGNOSIS — I480 Paroxysmal atrial fibrillation: Secondary | ICD-10-CM | POA: Diagnosis not present

## 2021-09-01 DIAGNOSIS — F419 Anxiety disorder, unspecified: Secondary | ICD-10-CM | POA: Diagnosis not present

## 2021-09-01 DIAGNOSIS — R0609 Other forms of dyspnea: Secondary | ICD-10-CM | POA: Diagnosis not present

## 2021-09-01 DIAGNOSIS — I10 Essential (primary) hypertension: Secondary | ICD-10-CM | POA: Diagnosis not present

## 2021-09-01 DIAGNOSIS — R778 Other specified abnormalities of plasma proteins: Secondary | ICD-10-CM | POA: Diagnosis not present

## 2021-09-01 LAB — COMPREHENSIVE METABOLIC PANEL
ALT: 38 U/L (ref 0–44)
AST: 44 U/L — ABNORMAL HIGH (ref 15–41)
Albumin: 3.2 g/dL — ABNORMAL LOW (ref 3.5–5.0)
Alkaline Phosphatase: 55 U/L (ref 38–126)
Anion gap: 11 (ref 5–15)
BUN: 11 mg/dL (ref 8–23)
CO2: 24 mmol/L (ref 22–32)
Calcium: 8.6 mg/dL — ABNORMAL LOW (ref 8.9–10.3)
Chloride: 100 mmol/L (ref 98–111)
Creatinine, Ser: 1 mg/dL (ref 0.44–1.00)
GFR, Estimated: 57 mL/min — ABNORMAL LOW (ref >60)
Glucose, Bld: 121 mg/dL — ABNORMAL HIGH (ref 70–99)
Potassium: 3 mmol/L — ABNORMAL LOW (ref 3.5–5.1)
Sodium: 135 mmol/L (ref 135–145)
Total Bilirubin: 0.8 mg/dL (ref 0.3–1.2)
Total Protein: 6.2 g/dL — ABNORMAL LOW (ref 6.5–8.1)

## 2021-09-01 LAB — ECHOCARDIOGRAM COMPLETE
Area-P 1/2: 2.91 cm2
Height: 60 in
S' Lateral: 3.4 cm
Single Plane A4C EF: 37.9 %
Weight: 2369.6 oz

## 2021-09-01 LAB — CBC
HCT: 36.1 % (ref 36.0–46.0)
Hemoglobin: 12.5 g/dL (ref 12.0–15.0)
MCH: 32.6 pg (ref 26.0–34.0)
MCHC: 34.6 g/dL (ref 30.0–36.0)
MCV: 94 fL (ref 80.0–100.0)
Platelets: 196 10*3/uL (ref 150–400)
RBC: 3.84 MIL/uL — ABNORMAL LOW (ref 3.87–5.11)
RDW: 12.8 % (ref 11.5–15.5)
WBC: 10.5 10*3/uL (ref 4.0–10.5)
nRBC: 0 % (ref 0.0–0.2)

## 2021-09-01 LAB — MAGNESIUM: Magnesium: 1.8 mg/dL (ref 1.7–2.4)

## 2021-09-01 LAB — CK: Total CK: 269 U/L — ABNORMAL HIGH (ref 38–234)

## 2021-09-01 LAB — HEPARIN LEVEL (UNFRACTIONATED): Heparin Unfractionated: 0.31 IU/mL (ref 0.30–0.70)

## 2021-09-01 MED ORDER — AMIODARONE HCL 200 MG PO TABS
200.0000 mg | ORAL_TABLET | Freq: Two times a day (BID) | ORAL | Status: DC
  Administered 2021-09-01 – 2021-09-03 (×5): 200 mg via ORAL
  Filled 2021-09-01 (×5): qty 1

## 2021-09-01 MED ORDER — PERFLUTREN LIPID MICROSPHERE
1.0000 mL | INTRAVENOUS | Status: AC | PRN
  Administered 2021-09-01: 3 mL via INTRAVENOUS

## 2021-09-01 MED ORDER — MAGNESIUM SULFATE IN D5W 1-5 GM/100ML-% IV SOLN
1.0000 g | Freq: Once | INTRAVENOUS | Status: AC
  Administered 2021-09-01: 1 g via INTRAVENOUS
  Filled 2021-09-01: qty 100

## 2021-09-01 MED ORDER — POTASSIUM CHLORIDE CRYS ER 20 MEQ PO TBCR
40.0000 meq | EXTENDED_RELEASE_TABLET | ORAL | Status: AC
  Administered 2021-09-01 (×3): 40 meq via ORAL
  Filled 2021-09-01 (×3): qty 2

## 2021-09-01 NOTE — TOC Initial Note (Signed)
Transition of Care [facility]) - Initial/Assessment Note    Patient Details  Name: Laurie Bowman MRN: [id number] Date of Birth: 03-26-1940  Transition of Care [facility]) CM/SW Contact:    Carles Collet, RN Phone Number: 09/01/2021, 2:42 PM  Clinical Narrative:             Damaris Schooner w patient at bedside. She states that she has shower seat at home, declines 3/1. She is agreeable to PT/ OT recs for [facility]. She currently has 3 visits left for OP services, but with c/o orthostasis she agrees [facility] would be a better plan. She has no preference for agency, and referral accepted by [facility].        Expected Discharge Plan: [place] Barriers to Discharge: Continued Medical Work up   Patient Goals and CMS Choice Patient states their goals for this hospitalization and ongoing recovery are:: to go home CMS Medicare.gov Compare Post Acute Care list provided to:: Patient Choice offered to / list presented to : Patient  Expected Discharge Plan and Services Expected Discharge Plan: [place]   Discharge Planning Services: CM Consult Post Acute Care Choice: [place] arrangements for the past 2 months: Single Family Home                 DME Arranged: N/A         HH Arranged: PT, OT HH Agency: [place] Date [facility] Agency Contacted: 09/01/21 Time HH Agency Contacted: [phone] Representative spoke with at [facility]: Tommi Rumps  Prior Living Arrangements/Services Living arrangements for the past 2 months: [place]     Do you feel safe going back to the place where you live?: Yes               Activities of Daily Living Home Assistive Devices/Equipment: Shower chair with back ADL Screening (condition at time of admission) Patient's cognitive ability adequate to safely complete daily activities?: Yes Is the patient deaf or have difficulty hearing?: No Does the patient have difficulty seeing, even when wearing glasses/contacts?: No Does the patient  have difficulty concentrating, remembering, or making decisions?: No Patient able to express need for assistance with ADLs?: Yes Does the patient have difficulty dressing or bathing?: No Independently performs ADLs?: Yes (appropriate for developmental age) Does the patient have difficulty walking or climbing stairs?: No Weakness of Legs: Both Weakness of Arms/Hands: Both  Permission Sought/Granted                  Emotional Assessment              Admission diagnosis:  Elevated troponin [R77.8] Generalized weakness [R53.1] Paroxysmal atrial fibrillation with RVR ([place]) [I48.0] Patient Active Problem List   Diagnosis Date Noted   Paroxysmal atrial fibrillation with RVR ([place]) 08/31/2021   Myalgia 08/30/2021   Hypomagnesemia 08/30/2021   Elevated AST (SGOT) 08/30/2021   Rhabdomyolysis 08/30/2021   Dehydration 08/30/2021   Generalized weakness 08/29/2021   Elevated troponin 08/29/2021   Fracture, [place], left, closed 05/03/2021   Aortic atherosclerosis ([place]) 09/27/2020   Elevated WBC count 09/27/2020   Hypokalemia 09/27/2020   Infection due to Staphylococcus epidermidis 09/27/2020   Other chest pain 09/27/2020   Urticaria    Cancer ([place])    Hypertrophic cardiomyopathy ([employer]) 04/30/2018   Mitral regurgitation 04/30/2018   LBBB (left bundle branch block) 04/30/2018   PVC's (premature ventricular contractions) 04/30/2018   Asthma with acute exacerbation 06/10/2016   Acute sinusitis 06/10/2016  GERD (gastroesophageal reflux disease) 02/06/2016   Other specified disorders of eyelid 11/09/2015   Moderate persistent asthma 09/28/2015   Allergic rhinitis 09/28/2015   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 06/21/2015   Encounter for long-term (current) use of high-risk medication 06/20/2015   Wound dehiscence 05/08/2015   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   Peripheral visual field defect of both eyes 04/21/2015   Dermatochalasis of both upper eyelids 04/14/2015    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   Asthma 12/16/2013   Benign neoplasm of colon 12/16/2013   Constipation 12/16/2013   Dizziness 12/16/2013   Fatty (change of) liver, not elsewhere classified 12/16/2013   Undiagnosed cardiac murmurs 12/16/2013   Heart murmur, systolic 21/74/7159   Hyperlipidemia, unspecified 12/16/2013   Irritable bowel syndrome 53/96/7289   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   Anxiety and depression 12/16/2013   Pain in unspecified knee 12/16/2013   Carpal tunnel syndrome 12/16/2013   Rotator cuff tendonitis 12/16/2013   Ventricular premature depolarization 12/16/2013   Abnormal mammogram 08/16/2013   Benign essential hypertension 07/08/2011   Fibrocystic breast changes 07/08/2011   PCP:  Derrill Center., MD Pharmacy:   [id number] [place] [id number]: [phone]: [phone]    Social Determinants of Health (SDOH) Interventions    Readmission Risk Interventions     No data to display

## 2021-09-01 NOTE — Plan of Care (Signed)

## 2021-09-01 NOTE — Progress Notes (Signed)
DAILY PROGRESS NOTE   Patient Name: Laurie Bowman Date of Encounter: 09/01/2021 Cardiologist: Shirlee More, MD  Chief Complaint   No complaints  Patient Profile   Laurie Bowman is a 81 y.o. female with a hx of hypertrophic cardiomyopathy who is being seen 08/31/2021 for the evaluation of atrial fibrillation at the request of Dr Cyndia Skeeters.  Subjective   Seen yesterday for new onset afib - underlying LBBB. History of HOCM. Rate not well-controlled with diltiazem, switched to amiodarone and started on IV Heparin by Dr. Gwenlyn Found. Echo in progress. Fortunately, she converted overnight to NSR.  Objective   Vitals:   09/01/21 0254 09/01/21 0559 09/01/21 0810 09/01/21 0817  BP: 121/70 121/68 118/77   Pulse: 67 63 65   Resp: '19 18 20   '$ Temp: 98.6 F (37 C) 98.6 F (37 C) 98.1 F (36.7 C)   TempSrc: Oral Oral Oral   SpO2: 98% 97% 97% 95%  Weight: 67.2 kg     Height:        Intake/Output Summary (Last 24 hours) at 09/01/2021 1115 Last data filed at 09/01/2021 0815 Gross per 24 hour  Intake 1181.15 ml  Output 3200 ml  Net -2018.85 ml   Filed Weights   08/30/21 1445 08/31/21 0121 09/01/21 0254  Weight: 68.8 kg 68.8 kg 67.2 kg    Physical Exam   General appearance: alert and no distress Neck: no carotid bruit, no JVD, and thyroid not enlarged, symmetric, no tenderness/mass/nodules Lungs: clear to auscultation bilaterally Heart: regular rate and rhythm, S1, S2 normal, and systolic murmur: systolic ejection 3/6, crescendo at 2nd right intercostal space Abdomen: soft, non-tender; bowel sounds normal; no masses,  no organomegaly Extremities: extremities normal, atraumatic, no cyanosis or edema Pulses: 2+ and symmetric Skin: Skin color, texture, turgor normal. No rashes or lesions Neurologic: Grossly normal Psych: pleasant  Inpatient Medications    Scheduled Meds:  aspirin  81 mg Oral Daily   budesonide (PULMICORT) nebulizer solution  0.25 mg Nebulization BID   buPROPion  150 mg  Oral Daily   calcium-vitamin D  1 tablet Oral Q breakfast   pantoprazole  40 mg Oral Daily   potassium chloride  40 mEq Oral Q4H   sodium chloride flush  3 mL Intravenous Q12H    Continuous Infusions:  sodium chloride 10 mL/hr at 08/31/21 1843   amiodarone 30 mg/hr (09/01/21 0520)   diltiazem (CARDIZEM) infusion Stopped (08/31/21 2328)   ferric gluconate (FERRLECIT) IVPB     heparin 900 Units/hr (09/01/21 0604)    PRN Meds: acetaminophen **OR** acetaminophen, dicyclomine, levalbuterol, nitroGLYCERIN, polyethylene glycol, traMADol   Labs   Results for orders placed or performed during the hospital encounter of 08/29/21 (from the past 48 hour(s))  Sedimentation rate     Status: Abnormal   Collection Time: 08/31/21  3:43 AM  Result Value Ref Range   Sed Rate 53 (H) 0 - 22 mm/hr    Comment: Performed at [facility], [address], [place], [place] [address]  C-reactive protein     Status: Abnormal   Collection Time: 08/31/21  3:43 AM  Result Value Ref Range   CRP 8.3 (H) <1.0 mg/dL    Comment: Performed at [facility] [address] [place], [place] [address]  CK     Status: Abnormal   Collection Time: 08/31/21  3:43 AM  Result Value Ref Range   Total CK 643 (H) 38 - 234 U/L    Comment: Performed at [facility], [place] [address], [place], [place] [address]  Comprehensive metabolic panel     Status: Abnormal   Collection Time: 08/31/21  3:43 AM  Result Value Ref Range   Sodium 138 135 - 145 mmol/L   Potassium 4.0 3.5 - 5.1 mmol/L   Chloride 110 98 - 111 mmol/L   CO2 20 (L) 22 - 32 mmol/L   Glucose, Bld 86 70 - 99 mg/dL    Comment: Glucose reference range applies only to samples taken after fasting for at least 8 hours.   BUN 12 8 - 23 mg/dL   Creatinine, Ser 0.95 0.44 - 1.00 mg/dL   Calcium 8.2 (L) 8.9 - 10.3 mg/dL   Total Protein 5.5 (L) 6.5 - 8.1 g/dL   Albumin 2.9 (L) 3.5 - 5.0 g/dL   AST 57 (H) 15 - 41 U/L   ALT 37 0 - 44 U/L   Alkaline  Phosphatase 46 38 - 126 U/L   Total Bilirubin 0.8 0.3 - 1.2 mg/dL   GFR, Estimated >60 >60 mL/min    Comment: (NOTE) Calculated using the CKD-EPI Creatinine Equation (2021)    Anion gap 8 5 - 15    Comment: Performed at [facility], [place] [address], [place], [place] [address]  Magnesium     Status: None   Collection Time: 08/31/21  3:43 AM  Result Value Ref Range   Magnesium 2.2 1.7 - 2.4 mg/dL    Comment: Performed at [facility] [address], [place], [place] [address]  Phosphorus     Status: Abnormal   Collection Time: 08/31/21  3:43 AM  Result Value Ref Range   Phosphorus 2.0 (L) 2.5 - 4.6 mg/dL    Comment: Performed at [facility] [address] [place], [place] [address]  CBC     Status: Abnormal   Collection Time: 08/31/21  3:43 AM  Result Value Ref Range   WBC 5.7 4.0 - 10.5 K/uL   RBC 3.64 (L) 3.87 - 5.11 MIL/uL   Hemoglobin 11.8 (L) 12.0 - 15.0 g/dL   HCT 35.2 (L) 36.0 - 46.0 %   MCV 96.7 80.0 - 100.0 fL   MCH 32.4 26.0 - 34.0 pg   MCHC 33.5 30.0 - 36.0 g/dL   RDW 12.8 11.5 - 15.5 %   Platelets 145 (L) 150 - 400 K/uL   nRBC 0.0 0.0 - 0.2 %    Comment: Performed at [facility], [place] [address] [place], [place] [address]  Vitamin B12     Status: None   Collection Time: 08/31/21  3:43 AM  Result Value Ref Range   Vitamin B-12 184 180 - 914 pg/mL    Comment: (NOTE) This assay is not validated for testing neonatal or myeloproliferative syndrome specimens for Vitamin B12 levels. Performed at [facility], [place] [address] [place], [place] [address]   Iron and TIBC     Status: Abnormal   Collection Time: 08/31/21  3:43 AM  Result Value Ref Range   Iron 19 (L) 28 - 170 ug/dL   TIBC 312 250 - 450 ug/dL   Saturation Ratios 6 (L) 10.4 - 31.8 %   UIBC 293 ug/dL    Comment: Performed at [facility], [place] [address] [place], [place] [address]  Ferritin     Status: None   Collection Time: 08/31/21  3:43 AM  Result Value Ref  Range   Ferritin 227 11 -  307 ng/mL    Comment: Performed at [facility], [place] [address], [place], [place] [address]  Folate     Status: None   Collection Time: 08/31/21  3:45 AM  Result Value Ref Range   Folate 25.1 >5.9 ng/mL    Comment: Performed at [facility], [place] [address] [place], [place] [address]  Reticulocytes     Status: Abnormal   Collection Time: 08/31/21  3:45 AM  Result Value Ref Range   Retic Ct Pct 2.5 0.4 - 3.1 %   RBC. 3.55 (L) 3.87 - 5.11 MIL/uL   Retic Count, Absolute 89.1 19.0 - 186.0 K/uL   Immature Retic Fract 24.6 (H) 2.3 - 15.9 %    Comment: Performed at [facility] [address] [place], [place] [address]  Brain natriuretic peptide     Status: Abnormal   Collection Time: 08/31/21  3:09 PM  Result Value Ref Range   B Natriuretic Peptide 655.8 (H) 0.0 - 100.0 pg/mL    Comment: Performed at [facility] [address] [place], [place] [address]  Troponin I (High Sensitivity)     Status: Abnormal   Collection Time: 08/31/21  3:09 PM  Result Value Ref Range   Troponin I (High Sensitivity) 32 (H) <18 ng/L    Comment: (NOTE) Elevated high sensitivity troponin I (hsTnI) values and significant  changes across serial measurements may suggest ACS but many other  chronic and acute conditions are known to elevate hsTnI results.  Refer to the "Links" section for chest pain algorithms and additional  guidance. Performed at [facility], [place] [address] [place], [place] [address]   Troponin I (High Sensitivity)     Status: Abnormal   Collection Time: 08/31/21  4:38 PM  Result Value Ref Range   Troponin I (High Sensitivity) 36 (H) <18 ng/L    Comment: (NOTE) Elevated high sensitivity troponin I (hsTnI) values and significant  changes across serial measurements may suggest ACS but many other  chronic and acute conditions are known to elevate hsTnI results.  Refer to the "Links" section for chest pain algorithms and additional   guidance. Performed at [facility], [place] [address] [place], [place] [address]   TSH     Status: None   Collection Time: 08/31/21  4:38 PM  Result Value Ref Range   TSH 3.744 0.350 - 4.500 uIU/mL    Comment: Performed by a 3rd Generation assay with a functional sensitivity of <=0.01 uIU/mL. Performed at [facility], [place] [address], [place], [place] [address]   CBC     Status: Abnormal   Collection Time: 09/01/21  2:29 AM  Result Value Ref Range   WBC 10.5 4.0 - 10.5 K/uL   RBC 3.84 (L) 3.87 - 5.11 MIL/uL   Hemoglobin 12.5 12.0 - 15.0 g/dL   HCT 36.1 36.0 - 46.0 %   MCV 94.0 80.0 - 100.0 fL   MCH 32.6 26.0 - 34.0 pg   MCHC 34.6 30.0 - 36.0 g/dL   RDW 12.8 11.5 - 15.5 %   Platelets 196 150 - 400 K/uL   nRBC 0.0 0.0 - 0.2 %    Comment: Performed at [facility], [place] [address] [place], [place] [address]  Comprehensive metabolic panel     Status: Abnormal   Collection Time: 09/01/21  2:29 AM  Result Value Ref Range   Sodium 135 135 - 145 mmol/L  Potassium 3.0 (L) 3.5 - 5.1 mmol/L    Comment: DELTA CHECK NOTED   Chloride 100 98 - 111 mmol/L   CO2 24 22 - 32 mmol/L   Glucose, Bld 121 (H) 70 - 99 mg/dL    Comment: Glucose reference range applies only to samples taken after fasting for at least 8 hours.   BUN 11 8 - 23 mg/dL   Creatinine, Ser 1.00 0.44 - 1.00 mg/dL   Calcium 8.6 (L) 8.9 - 10.3 mg/dL   Total Protein 6.2 (L) 6.5 - 8.1 g/dL   Albumin 3.2 (L) 3.5 - 5.0 g/dL   AST 44 (H) 15 - 41 U/L   ALT 38 0 - 44 U/L   Alkaline Phosphatase 55 38 - 126 U/L   Total Bilirubin 0.8 0.3 - 1.2 mg/dL   GFR, Estimated 57 (L) >60 mL/min    Comment: (NOTE) Calculated using the CKD-EPI Creatinine Equation (2021)    Anion gap 11 5 - 15    Comment: Performed at [facility], [place] [address], [place], [place] [address]  Magnesium     Status: None   Collection Time: 09/01/21  2:29 AM  Result Value Ref Range   Magnesium 1.8 1.7 - 2.4 mg/dL    Comment: Performed  at [facility] [address], [place], [place] [address]  CK     Status: Abnormal   Collection Time: 09/01/21  2:29 AM  Result Value Ref Range   Total CK 269 (H) 38 - 234 U/L    Comment: Performed at [facility], [place] [address], [place], [place] [address]    ECG   N/A  Telemetry   Sinus rhythm - Personally Reviewed  Radiology    DG Chest Port 1 View  Result Date: 08/31/2021 CLINICAL DATA:  Dyspnea. EXAM: PORTABLE CHEST 1 VIEW COMPARISON:  Radiographs 08/29/2021 and 02/12/2021.  CT 09/17/2020. FINDINGS: 1508 hours. Stable cardiomegaly and aortic atherosclerosis. Stable mild chronic interstitial prominence without evidence of superimposed edema, confluent airspace opacity, pneumothorax or significant pleural effusion. The bones appear unchanged. No acute osseous findings are evident. IMPRESSION: Stable cardiomegaly and chronic interstitial lung disease. No acute process identified. Electronically Signed   By: Richardean Sale M.D.   On: 08/31/2021 15:23    Cardiac Studies   Echo pending  Assessment   Principal Problem:   Generalized weakness Active Problems:   Moderate persistent asthma   GERD (gastroesophageal reflux disease)   Hypertrophic cardiomyopathy ([facility])   Benign essential hypertension   Hyperlipidemia, unspecified   Irritable bowel syndrome   Anxiety and depression   Hypokalemia   Elevated troponin   Myalgia   Hypomagnesemia   Elevated AST (SGOT)   Rhabdomyolysis   Dehydration   Paroxysmal atrial fibrillation with RVR ([place])   Plan   Successful conversion to NSR overnight on amiodarone. Echo pending - will convert to oral amiodarone 200 mg BID x 7 days, then 200 mg daily. Continue IV heparin pending echo findings, ?possible need for ischemic eval - she had chest pain, but trops flat mildly elevated.  Time Spent Directly with Patient:  I have spent a total of 25 minutes with the patient reviewing hospital notes, telemetry, EKGs, labs and  examining the patient as well as establishing an assessment and plan that was discussed personally with the patient.  > 50% of time was spent in direct patient care.  Length of Stay:  LOS: 1 day   Pixie Casino, MD, [facility], FACP  [place] Director of the Advanced Lipid Disorders &  Cardiovascular Risk Reduction Clinic Diplomate of the American Board of Clinical Lipidology Attending Cardiologist  Direct Dial: [phone]  Fax: [phone]  Website:  [email or web address] 09/01/2021, 11:15 AM

## 2021-09-01 NOTE — Progress Notes (Signed)
PROGRESS NOTE  Laurie Bowman FYB:[id number] DOB: 10/29/1940   PCP: Derrill Center., MD  Patient is from: Home.  Independently ambulates at baseline.  DOA: 08/29/2021 LOS: 1  Chief complaints Chief Complaint  Patient presents with   Weakness     Brief Narrative / Interim history: 81 year old F with PMH of osteoporosis, recent left wrist fracture, HOCM, LBBB, heart murmur, asthma, GERD, HTN, anxiety, depression, IBS, benign breast lesion s/p remote lumpectomy presenting with myalgia and generalized weakness hours after Reclast infusion for osteoporosis.  She fell getting out of the bed the morning of admission and wedged between the bed and the nightstand.  She denies hitting her head or loss of consciousness.  She called EMS and brought to ED for further evaluation.  In ED, stable vitals.  K3.0. Cr 1.03 (baseline).  T. bili 1.4.  CK1 51.  WBC 9.4 with left shift.  Troponin 33>> 36.  EKG with LBBB (chronic).  UA negative.  CXR cardiomegaly with similar chronic interstitial changes bilaterally but no acute finding.  Patient was started on potassium supplementation, IV fluid and pain medication, and admitted for generalized weakness and myalgia.  Patient went into A-fib with RVR with some dyspnea.  IV fluid discontinued.  Started on IV Cardizem but remained in RVR.  Cardiology consulted and restarted amiodarone and IV heparin.  She converted to sinus rhythm.  Subjective: Seen and examined earlier this morning.  No major events overnight of this morning.  Feels better this morning.  Feels some shortness of breath and cough that has improved after nebulizer.  She denies chest pain, GI or UTI symptoms.  Objective: Vitals:   09/01/21 0810 09/01/21 0817 09/01/21 1203 09/01/21 1533  BP: 118/77   (!) 160/84  Pulse: 65   73  Resp: 20   17  Temp: 98.1 F (36.7 C)  97.9 F (36.6 C) 98.5 F (36.9 C)  TempSrc: Oral  Oral Oral  SpO2: 97% 95%  95%  Weight:      Height:         Examination:  GENERAL: No apparent distress.  Nontoxic. HEENT: MMM.  Vision and hearing grossly intact.  NECK: Supple.  No apparent JVD.  RESP:  No IWOB.  Fair aeration bilaterally. CVS:  RRR. Heart sounds normal.  ABD/GI/GU: BS+. Abd soft, NTND.  MSK/EXT:  Moves extremities. No apparent deformity. No edema.  SKIN: no apparent skin lesion or wound NEURO: Awake and alert. Oriented appropriately.  No apparent focal neuro deficit. PSYCH: Calm. Normal affect.   Procedures:  None  Microbiology summarized: COVID-19 PCR nonreactive  Assessment and plan: Principal Problem:   Generalized weakness Active Problems:   Moderate persistent asthma   GERD (gastroesophageal reflux disease)   Hypertrophic cardiomyopathy (HCC)   Benign essential hypertension   Hyperlipidemia, unspecified   Irritable bowel syndrome   Anxiety and depression   Hypokalemia   Elevated troponin   Myalgia   Hypomagnesemia   Elevated AST (SGOT)   Rhabdomyolysis   Dehydration   Paroxysmal atrial fibrillation with RVR (HCC)  Generalized weakness/myalgia/arthralgia-likely due to Reclast infusion.  She is also on Crestor. Rhabdomyolysis-could be due to Reclast infusion, starting or fall.  CK improved. -Continue trending CK -Continue holding Crestor -PT/OT eval  Paroxysmal A-fib with RVR: New onset.  Likely provoked by IV fluid and holding Verapamil in the setting of hypotension.  Converted to sinus rhythm with IV amiodarone.  TSH within normal.  TTE with LVEF of 50 to 55%, severe asymmetric LVH, G1 DD  but no RWMA. -Transition to p.o. amiodarone 200 mg twice daily for 1 week followed by 200 mg daily -On IV heparin for anticoagulation. -Xopenex instead of albuterol for asthma. -Continue telemetry monitoring -Optimize electrolytes -Cardiology following   Elevated creatinine/dehydration: Baseline Cr ~1.1.  Recent Labs    08/29/21 1453 08/30/21 0421 08/31/21 0343 09/01/21 0229  BUN '11 19 12 11   '$ CREATININE 1.03* 1.30* 0.95 1.00  -Continue monitoring  Elevated troponin/chronic LBBB: Elevated troponin likely demand ischemia.  -See dyspnea.  Normocytic anemia: Drop in Hgb likely dilutional from hemodilution.  Denies melena hematochezia. Recent Labs    08/29/21 1453 08/30/21 0421 08/31/21 0343 09/01/21 0229  HGB 13.8 10.9* 11.8* 12.5  -Continue monitoring -IV ferric gluconate 250 mg x 1    Hypokalemia/hypomagnesemia: Likely from diuretics. -Monitor replenish as appropriate  Elevated AST: Mild.  Likely due to elevated CK.  Improved. -Monitor  Mild intermittent asthma: Stable -Change as needed albuterol to Xopenex. -Continue budesonide  Dyspnea: Likely due to A-fib with RVR and underlying asthma.  Improved. -Manage A-fib as above. -Xopenex for asthma  Abdominal pain and tenderness: Resolved  GERD -Continue home PPI  Hypertension: Normotensive -Home verapamil and HCTZ on hold  History of HOCM/MVR: Denies dizziness or syncopal episode.  TTE as above. -Cardiology following  Hyperlipidemia -Hold Crestor given myalgia and rhabdomyolysis  Anxiety/depression/IBS: Appears anxious. -Reassurance. -Continue home meds  Recent wrist fracture: Reports improvement -Continue therapy     DVT prophylaxis:    Code Status: Full code Family Communication: None at bedside Level of care: Telemetry Cardiac Status is: Inpatient  The patient will remain inpatient due to new onset A-fib   Final disposition: Likely home once medically stable. Consultants:  Cardiology  Sch Meds:  Scheduled Meds:  amiodarone  200 mg Oral BID   aspirin  81 mg Oral Daily   budesonide (PULMICORT) nebulizer solution  0.25 mg Nebulization BID   buPROPion  150 mg Oral Daily   calcium-vitamin D  1 tablet Oral Q breakfast   pantoprazole  40 mg Oral Daily   sodium chloride flush  3 mL Intravenous Q12H   Continuous Infusions:  sodium chloride 10 mL/hr at 08/31/21 1843   diltiazem  (CARDIZEM) infusion Stopped (08/31/21 2328)   ferric gluconate (FERRLECIT) IVPB     heparin 950 Units/hr (09/01/21 1450)   PRN Meds:.acetaminophen **OR** acetaminophen, dicyclomine, levalbuterol, nitroGLYCERIN, polyethylene glycol, traMADol  Antimicrobials: Anti-infectives (From admission, onward)    None        I have personally reviewed the following labs and images: CBC: Recent Labs  Lab 08/29/21 1453 08/30/21 0421 08/31/21 0343 09/01/21 0229  WBC 9.4 6.1 5.7 10.5  NEUTROABS 8.8*  --   --   --   HGB 13.8 10.9* 11.8* 12.5  HCT 39.6 32.6* 35.2* 36.1  MCV 94.7 97.0 96.7 94.0  PLT 198 151 145* 196   BMP &GFR Recent Labs  Lab 08/29/21 1453 08/30/21 0421 08/31/21 0343 09/01/21 0229  NA 136 134* 138 135  K 3.0* 3.6 4.0 3.0*  CL 102 104 110 100  CO2 25 21* 20* 24  GLUCOSE 103* 93 86 121*  BUN '11 19 12 11  '$ CREATININE 1.03* 1.30* 0.95 1.00  CALCIUM 9.9 8.5* 8.2* 8.6*  MG  --  1.6* 2.2 1.8  PHOS  --   --  2.0*  --    Estimated Creatinine Clearance: 39 mL/min (by C-G formula based on SCr of 1 mg/dL). Liver & Pancreas: Recent Labs  Lab 08/29/21 1453  08/30/21 0421 08/31/21 0343 09/01/21 0229  AST 50* 58* 57* 44*  ALT 39 38 37 38  ALKPHOS 51 42 46 55  BILITOT 1.4* 1.0 0.8 0.8  PROT 6.6 5.2* 5.5* 6.2*  ALBUMIN 3.7 3.0* 2.9* 3.2*   No results for input(s): "LIPASE", "AMYLASE" in the last 168 hours. No results for input(s): "AMMONIA" in the last 168 hours. Diabetic: No results for input(s): "HGBA1C" in the last 72 hours. No results for input(s): "GLUCAP" in the last 168 hours. Cardiac Enzymes: Recent Labs  Lab 08/29/21 1453 08/30/21 0421 08/31/21 0343 09/01/21 0229  CKTOTAL 151 587* 643* 269*   No results for input(s): "PROBNP" in the last 8760 hours. Coagulation Profile: No results for input(s): "INR", "PROTIME" in the last 168 hours. Thyroid Function Tests: Recent Labs    08/31/21 1638  TSH 3.744   Lipid Profile: No results for input(s):  "CHOL", "HDL", "LDLCALC", "TRIG", "CHOLHDL", "LDLDIRECT" in the last 72 hours. Anemia Panel: Recent Labs    08/31/21 0343 08/31/21 0345  VITAMINB12 184  --   FOLATE  --  25.1  FERRITIN 227  --   TIBC 312  --   IRON 19*  --   RETICCTPCT  --  2.5   Urine analysis:    Component Value Date/Time   COLORURINE YELLOW 08/29/2021 1917   APPEARANCEUR CLEAR 08/29/2021 1917   LABSPEC 1.020 08/29/2021 1917   PHURINE 7.0 08/29/2021 1917   GLUCOSEU NEGATIVE 08/29/2021 1917   HGBUR NEGATIVE 08/29/2021 1917   BILIRUBINUR NEGATIVE 08/29/2021 [place] 08/29/2021 1917   PROTEINUR NEGATIVE 08/29/2021 1917   NITRITE NEGATIVE 08/29/2021 1917   LEUKOCYTESUR NEGATIVE 08/29/2021 1917   Sepsis Labs: Invalid input(s): "PROCALCITONIN", "LACTICIDVEN"  Microbiology: Recent Results (from the past 240 hour(s))  SARS Coronavirus 2 by RT PCR (hospital order, performed in [facility] hospital lab) *cepheid single result test* Anterior Nasal Swab     Status: None   Collection Time: 08/29/21  2:37 PM   Specimen: Anterior Nasal Swab  Result Value Ref Range Status   SARS Coronavirus 2 by RT PCR NEGATIVE NEGATIVE Final    Comment: (NOTE) SARS-CoV-2 target nucleic acids are NOT DETECTED.  The SARS-CoV-2 RNA is generally detectable in upper and lower respiratory specimens during the acute phase of infection. The lowest concentration of SARS-CoV-2 viral copies this assay can detect is 250 copies / mL. A negative result does not preclude SARS-CoV-2 infection and should not be used as the sole basis for treatment or other patient management decisions.  A negative result may occur with improper specimen collection / handling, submission of specimen other than nasopharyngeal swab, presence of viral mutation(s) within the areas targeted by this assay, and inadequate number of viral copies (<250 copies / mL). A negative result must be combined with clinical observations, patient history, and  epidemiological information.  Fact Sheet for Patients:   https://www.patel.info/  Fact Sheet for Healthcare Providers: https://hall.com/  This test is not yet approved or  cleared by the [place] FDA and has been authorized for detection and/or diagnosis of SARS-CoV-2 by FDA under an Emergency Use Authorization (EUA).  This EUA will remain in effect (meaning this test can be used) for the duration of the COVID-19 declaration under Section 564(b)(1) of the Act, 21 U.S.C. section 360bbb-3(b)(1), unless the authorization is terminated or revoked sooner.  Performed at [facility], [place] [address], [place], [place] [address]     Radiology Studies: ECHOCARDIOGRAM COMPLETE  Result Date:  09/01/2021    ECHOCARDIOGRAM REPORT   Patient Name:   Zoeann Mory Date of Exam: 09/01/2021 Medical Rec #:  [id number]    Height:       60.0 in Accession #:    [id number]   Weight:       148.1 lb Date of Birth:  24-Jul-1940    BSA:          1.643 m Patient Age:    40 years     BP:           118/77 mmHg Patient Gender: F            HR:           62 bpm. Exam Location:  Inpatient Procedure: 2D Echo and Intracardiac Opacification Agent Indications:    dyspnea  History:        Patient has prior history of Echocardiogram examinations.                 Hypertrophic Cardiomyopathy; Arrythmias:Atrial Fibrillation and                 LBBB.  Sonographer:    Kings Grant Referring Phys: [id number] Kerry Chisolm T Christe Tellez IMPRESSIONS  1. Left ventricular ejection fraction, by estimation, is 50 to 55%. The left ventricle has low normal function. The left ventricle has no regional wall motion abnormalities, save fo septal changes realted to conduction delay. There is severe asymmetric left ventricular hypertrophy of the basal-septal segment (15 mm thickness of septal bulge). Left ventricular diastolic parameters are consistent with Grade I diastolic dysfunction (impaired relaxation). No  LVOT Obstruction seen in this study.  2. Right ventricular systolic function is hyperdynamic. The right ventricular size is normal. There is normal pulmonary artery systolic pressure. The estimated right ventricular systolic pressure is 85.6 mmHg.  3. The pericardial effusion is posterior to the left ventricle.  4. The mitral valve is normal in structure. Mild mitral valve regurgitation. No evidence of mitral stenosis.  5. The aortic valve is tricuspid. There is mild calcification of the aortic valve. There is mild thickening of the aortic valve. Aortic valve regurgitation is not visualized. Aortic valve sclerosis is present, with no evidence of aortic valve stenosis. Comparison(s): Unclear change in LVEF: LVEF is worse from prior on contrast portion, contrast not done in prior imaging. FINDINGS  Left Ventricle: Left ventricular ejection fraction, by estimation, is 50 to 55%. The left ventricle has low normal function. The left ventricle has no regional wall motion abnormalities. Definity contrast agent was given IV to delineate the left ventricular endocardial borders. The left ventricular internal cavity size was normal in size. There is severe asymmetric left ventricular hypertrophy of the basal-septal segment. Abnormal (paradoxical) septal motion, consistent with left bundle branch block. Left ventricular diastolic parameters are consistent with Grade I diastolic dysfunction (impaired relaxation). Right Ventricle: The right ventricular size is normal. No increase in right ventricular wall thickness. Right ventricular systolic function is hyperdynamic. There is normal pulmonary artery systolic pressure. The tricuspid regurgitant velocity is 2.84 m/s, and with an assumed right atrial pressure of 3 mmHg, the estimated right ventricular systolic pressure is 31.4 mmHg. Left Atrium: Left atrial size was normal in size. Right Atrium: Right atrial size was normal in size. Pericardium: Trivial pericardial effusion is  present. The pericardial effusion is posterior to the left ventricle. Presence of epicardial fat layer. Mitral Valve: The mitral valve is normal in structure. Mild mitral valve regurgitation. No evidence of mitral valve stenosis.  Tricuspid Valve: The tricuspid valve is normal in structure. Tricuspid valve regurgitation is mild . No evidence of tricuspid stenosis. Aortic Valve: The aortic valve is tricuspid. There is mild calcification of the aortic valve. There is mild thickening of the aortic valve. There is mild aortic valve annular calcification. Aortic valve regurgitation is not visualized. Aortic valve sclerosis is present, with no evidence of aortic valve stenosis. Pulmonic Valve: The pulmonic valve was not well visualized. Pulmonic valve regurgitation is trivial. No evidence of pulmonic stenosis. Aorta: The aortic root is normal in size and structure. Pulmonary Artery: The pulmonary artery is of normal size. IAS/Shunts: No atrial level shunt detected by color flow Doppler.  LEFT VENTRICLE PLAX 2D LVIDd:         4.50 cm     Diastology LVIDs:         3.40 cm     LV e' medial:    9.30 cm/s LV PW:         1.10 cm     LV E/e' medial:  9.4 LV IVS:        1.10 cm     LV e' lateral:   4.58 cm/s LVOT diam:     1.40 cm     LV E/e' lateral: 19.1 LV SV:         50 LV SV Index:   30 LVOT Area:     1.54 cm  LV Volumes (MOD) LV vol d, MOD A4C: 55.2 ml LV vol s, MOD A4C: 34.3 ml LV SV MOD A4C:     55.2 ml RIGHT VENTRICLE             IVC RV S prime:     12.80 cm/s  IVC diam: 1.60 cm LEFT ATRIUM             Index LA diam:        4.00 cm 2.43 cm/m LA Vol (A2C):   52.6 ml 32.02 ml/m LA Vol (A4C):   32.9 ml 20.03 ml/m LA Biplane Vol: 42.2 ml 25.69 ml/m  AORTIC VALVE LVOT Vmax:   155.00 cm/s LVOT Vmean:  101.000 cm/s LVOT VTI:    0.322 m  AORTA Ao Root diam: 2.70 cm Ao Asc diam:  2.60 cm MITRAL VALVE                TRICUSPID VALVE MV Area (PHT): 2.91 cm     TR Peak grad:   32.3 mmHg MV Decel Time: 261 msec     TR Vmax:         284.00 cm/s MV E velocity: 87.50 cm/s MV A velocity: 104.00 cm/s  SHUNTS MV E/A ratio:  0.84         Systemic VTI:  0.32 m                             Systemic Diam: 1.40 cm Rudean Haskell MD Electronically signed by Rudean Haskell MD Signature Date/Time: 09/01/2021/1:35:55 PM    Final       Evalena Fujii T. [place]  If 7PM-7AM, please contact night-coverage [email or web address] 09/01/2021, 3:51 PM

## 2021-09-01 NOTE — Progress Notes (Signed)
  Echocardiogram 2D Echocardiogram has been performed.  Laurie Bowman 09/01/2021, 11:43 AM

## 2021-09-01 NOTE — Progress Notes (Signed)
[facility] floor coverage for both [facility] and [facility] (remote) on night of 08/31/21 into morning of 09/01/21:    I was notified by RN, that this patient who is here for atrial fibrillation with RVR, remains on both diltiazem drip as well as amiodarone drip at this time.  RN notes that the patient is now converted to normal sinus rhythm, and the diltiazem drip has been reduced to 5 mg/h.  I subsequently requested that diltiazem drip be held while maintaining existing amiodarone drip.    Babs Bertin, DO Hospitalist

## 2021-09-01 NOTE — Progress Notes (Addendum)
ANTICOAGULATION CONSULT NOTE - Initial Consult  Pharmacy Consult for Enoxaparin>>heparin Indication: atrial fibrillation  Allergies  Allergen Reactions   Cefaclor Itching    Other reaction(s): Flushing (ALLERGY/intolerance)   Codeine Itching and Nausea And Vomiting   Fructose Swelling    Swelling and GI upset, Fructose Granules    Sulfa Antibiotics Swelling   Meperidine Nausea And Vomiting and Itching   Elemental Sulfur Swelling    Patient Measurements: Height: 5' (152.4 cm) Weight: 67.2 kg (148 lb 1.6 oz) IBW/kg (Calculated) : 45.5 Heparin Dosing Weight: 60.5 kg   Vital Signs: Temp: 97.9 F (36.6 C) (06/17 1203) Temp Source: Oral (06/17 1203) BP: 118/77 (06/17 0810) Pulse Rate: 65 (06/17 0810)  Labs: Recent Labs    08/30/21 0421 08/31/21 0343 08/31/21 1509 08/31/21 1638 09/01/21 0229 09/01/21 1349  HGB 10.9* 11.8*  --   --  12.5  --   HCT 32.6* 35.2*  --   --  36.1  --   PLT 151 145*  --   --  196  --   HEPARINUNFRC  --   --   --   --   --  0.31  CREATININE 1.30* 0.95  --   --  1.00  --   CKTOTAL 587* 643*  --   --  269*  --   TROPONINIHS 43*  --  32* 36*  --   --      Estimated Creatinine Clearance: 39 mL/min (by C-G formula based on SCr of 1 mg/dL).   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 Possibly aggravated by Tamoxifen   Fibrocystic breast changes 07/08/2011   Fracture, [place], left, closed 05/03/2021   GERD (gastroesophageal reflux disease) 02/06/2016   Heart murmur, systolic 41/05/2438   Overview:  Overview:  Mild MR/TR, echo 2007 without change since 2001   Hyperlipidemia LDL goal <70 12/16/2013   Hyperlipidemia, unspecified 12/16/2013   Hypertension    Hypertensive heart disease 04/30/2018   Hypertrophic cardiomyopathy ([place]) 04/30/2018   Hypokalemia 09/27/2020   Infection due to Staphylococcus epidermidis 09/27/2020   Irritable bowel syndrome 12/16/2013   LBBB (left bundle branch block) 03/20/7251   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   Major depressive disorder, recurrent, unspecified ([place]) 12/16/2013   Mitral regurgitation 04/30/2018   Moderate persistent asthma 09/28/2015   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 Pharmacy consulted for heparin for Afib. No anticoagulation prior to admission. Hgb 12.5. Plts 196. Heparin level is therapeutic at 0.31. Pt received dose of lovenox  on 06/16 at 1700. No bleeding noted.   Goal of Therapy:  Heparin level: 0.3-0.7 Monitor platelets by anticoagulation protocol: Yes   Plan:  Increase heparin to 950 units/hr to remain therapeutic Will check HL with AM labs (around 8 hours) Monitor plts closely  Monitor renal function, CBC and s/s of bleeding  Follow up transition to oral anticoagulant   Thank you for allowing pharmacy to participate in this patient's care.  Reatha Harps, PharmD PGY1 Pharmacy Resident 09/01/2021 2:30 PM Check [email or web address] for unit specific pharmacy number

## 2021-09-02 DIAGNOSIS — I10 Essential (primary) hypertension: Secondary | ICD-10-CM | POA: Diagnosis not present

## 2021-09-02 DIAGNOSIS — K219 Gastro-esophageal reflux disease without esophagitis: Secondary | ICD-10-CM | POA: Diagnosis not present

## 2021-09-02 DIAGNOSIS — F419 Anxiety disorder, unspecified: Secondary | ICD-10-CM | POA: Diagnosis not present

## 2021-09-02 DIAGNOSIS — T796XXA Traumatic ischemia of muscle, initial encounter: Secondary | ICD-10-CM | POA: Diagnosis not present

## 2021-09-02 DIAGNOSIS — R531 Weakness: Secondary | ICD-10-CM | POA: Diagnosis not present

## 2021-09-02 DIAGNOSIS — I480 Paroxysmal atrial fibrillation: Secondary | ICD-10-CM | POA: Diagnosis not present

## 2021-09-02 LAB — RENAL FUNCTION PANEL
Albumin: 2.8 g/dL — ABNORMAL LOW (ref 3.5–5.0)
Anion gap: 7 (ref 5–15)
BUN: 9 mg/dL (ref 8–23)
CO2: 21 mmol/L — ABNORMAL LOW (ref 22–32)
Calcium: 8.7 mg/dL — ABNORMAL LOW (ref 8.9–10.3)
Chloride: 107 mmol/L (ref 98–111)
Creatinine, Ser: 1.08 mg/dL — ABNORMAL HIGH (ref 0.44–1.00)
GFR, Estimated: 52 mL/min — ABNORMAL LOW (ref >60)
Glucose, Bld: 88 mg/dL (ref 70–99)
Phosphorus: 2 mg/dL — ABNORMAL LOW (ref 2.5–4.6)
Potassium: 4.2 mmol/L (ref 3.5–5.1)
Sodium: 135 mmol/L (ref 135–145)

## 2021-09-02 LAB — CBC
HCT: 32.5 % — ABNORMAL LOW (ref 36.0–46.0)
Hemoglobin: 11.1 g/dL — ABNORMAL LOW (ref 12.0–15.0)
MCH: 32.7 pg (ref 26.0–34.0)
MCHC: 34.2 g/dL (ref 30.0–36.0)
MCV: 95.9 fL (ref 80.0–100.0)
Platelets: 186 10*3/uL (ref 150–400)
RBC: 3.39 MIL/uL — ABNORMAL LOW (ref 3.87–5.11)
RDW: 12.7 % (ref 11.5–15.5)
WBC: 7.9 10*3/uL (ref 4.0–10.5)
nRBC: 0 % (ref 0.0–0.2)

## 2021-09-02 LAB — HEPARIN LEVEL (UNFRACTIONATED)
Heparin Unfractionated: 0.14 IU/mL — ABNORMAL LOW (ref 0.30–0.70)
Heparin Unfractionated: 0.24 IU/mL — ABNORMAL LOW (ref 0.30–0.70)

## 2021-09-02 LAB — MAGNESIUM: Magnesium: 1.8 mg/dL (ref 1.7–2.4)

## 2021-09-02 MED ORDER — LOSARTAN POTASSIUM 25 MG PO TABS
25.0000 mg | ORAL_TABLET | Freq: Every day | ORAL | Status: DC
  Administered 2021-09-02 – 2021-09-03 (×2): 25 mg via ORAL
  Filled 2021-09-02 (×2): qty 1

## 2021-09-02 MED ORDER — MAGNESIUM SULFATE 2 GM/50ML IV SOLN
2.0000 g | Freq: Once | INTRAVENOUS | Status: AC
Start: 2021-09-02 — End: 2021-09-03
  Administered 2021-09-02: 2 g via INTRAVENOUS
  Filled 2021-09-02: qty 50

## 2021-09-02 MED ORDER — APIXABAN 5 MG PO TABS
5.0000 mg | ORAL_TABLET | Freq: Two times a day (BID) | ORAL | Status: DC
  Administered 2021-09-02 – 2021-09-03 (×3): 5 mg via ORAL
  Filled 2021-09-02 (×3): qty 1

## 2021-09-02 MED ORDER — HYDROCHLOROTHIAZIDE 25 MG PO TABS
25.0000 mg | ORAL_TABLET | Freq: Every day | ORAL | Status: DC
  Administered 2021-09-02 – 2021-09-03 (×2): 25 mg via ORAL
  Filled 2021-09-02 (×2): qty 1

## 2021-09-02 NOTE — Assessment & Plan Note (Addendum)
Patient has converted to sinus rhythm. Echocardiogram with preserved LV systolic function. EF 50 to 55%, with severe asymmetric left ventricular hypertrophy of the basal septal segment. No LVOT obstruction seen. Preserved RV systolic function. RVSP 35.3 mmHg. Trivial pericardial effusion.   Continue rhythm control with amiodarone and transition to apixaban for anticoagulation.  Will discontinue aspirin to prevent bleeding complications

## 2021-09-02 NOTE — Assessment & Plan Note (Signed)
No signs of decompensated heart failure,  Plan to continue blood pressure control with losartan and HZTC.

## 2021-09-02 NOTE — Assessment & Plan Note (Signed)
Continue with pantoprazole/.  

## 2021-09-02 NOTE — Progress Notes (Addendum)
Progress Note   Patient: Laurie Bowman:[id number] DOB: 08-17-1940 DOA: 08/29/2021     2 DOS: the patient was seen and examined on 09/02/2021   Brief hospital course: Laurie Bowman was admitted to the hospital with the working diagnosis of rhabdomyolysis.   81 yo female with the past medical history of asthma, hypertension, CKD and dyslipidemia, who presented with body aches and weakness. Reported 2 to 3 days of persistent symptoms. She sustained a mechanical fall at home and was wedged between the bed and the nightstand. EMS was called and she was transported to the hospital. On her initial physical examination her blood pressure was 125/72, HR 82, RR 12 and 02 saturation 97%, lungs with no wheezing or rales, heart with S1 and S2 present and rhythmic with no gallops, abdomen not distended, no lower extremity edema but tender muscles to palpation.   Na 136, K 3,0 Cl 102 bicarbonate 25, glucose 103 bun 11 cr 10  CK 151, 587, 643, 269  High sensitive troponin 33  Urine analysis with SG 1.020 Negative hgb dipstick.   Chest radiograph with cardiomegaly with infiltrates.   EKG 84 bpm, left axis deviation, left bundle branch block, sinus rhythm with poor R wave progression, J point elevation in V3 and V4 with no significant T wave changes.   Patient received K supplementation and IV fluids. She developed atrial fibrillation with rapid ventricular response.  IV fluids were discontinued and she was placed on diltiazem infusion for heart rate control.   Cardiology was consulted and she was placed on amiodarone and IV heparin.  Converted to sinus rhythm.     Assessment and Plan: * Rhabdomyolysis Clinically resolved.  Renal function is stable.  Patient is tolerating po well, continue to hold on IV fluids.  Out of bed tid with meals, continue to encourage mobility. Consult PT and OT  Essential hypertension Uncontrolled hypertension.  At home patient on verapamil and HCTZ.   Verapamil has  been discontinued.  Will add losartan and resume HCTZ for blood pressure control.   Hypertrophic cardiomyopathy ([place]) No signs of decompensated heart failure,  Plan to continue blood pressure control with losartan and HZTC.   Paroxysmal atrial fibrillation with RVR ([place]) Patient has converted to sinus rhythm. Echocardiogram with preserved LV systolic function. EF 50 to 55%, with severe asymmetric left ventricular hypertrophy of the basal septal segment. No LVOT obstruction seen. Preserved RV systolic function. RVSP 35.3 mmHg. Trivial pericardial effusion.   Continue rhythm control with amiodarone and transition to apixaban for anticoagulation.  Will discontinue aspirin to prevent bleeding complications   Hypokalemia Hypomagnesemia.   Renal function stable with serum cr at 1.0. K has been corrected to 4,2 and serum bicarbonate is 21. Mg is 1,8.   Plan to add 2 g mag sulfate today and follow up renal function and electrolytes in am.  Continue to hold on IV fluids.  No clinical signs of myoglobinuria.    GERD (gastroesophageal reflux disease) Continue with pantoprazole.   Hyperlipidemia, unspecified Follow as outpatient,         Subjective: Patient is feeling better, no chest pain or palpitations.   Physical Exam: Vitals:   09/01/21 2025 09/02/21 0546 09/02/21 0906 09/02/21 1114  BP: (!) 162/97 133/67  (!) 172/100  Pulse: 78 65  65  Resp: '17 16  18  '$ Temp: 98.3 F (36.8 C) 98.3 F (36.8 C)  97.9 F (36.6 C)  TempSrc: Oral Oral  Oral  SpO2: 97% 96% 97% 97%  Weight:  67.5 kg    Height:       Neurology awake and alert ENT With no pallor Cardiovascular with S1 and S2 present and rhythmic with no gallops, rubs or murmurs Respiratory with no rales or wheezing Abdomen not distended No lower extremity edema  Data Reviewed:    Family Communication: no family at the bedside   Disposition: Status is: Inpatient Remains inpatient appropriate because: atrial  fibrillation and uncontrolled hypertension,   Planned Discharge Destination: Home     Author: Tawni Millers, MD 09/02/2021 4:49 PM  For on call review [email or web address].

## 2021-09-02 NOTE — Progress Notes (Signed)
Mobility Specialist: Progress Note   09/02/21 1527  Mobility  Activity Ambulated with assistance in hallway  Level of Assistance Minimal assist, patient does 75% or more  Assistive Device Front wheel walker  Distance Ambulated (ft) 240 ft  Activity Response Tolerated well  $Mobility charge 1 Mobility   Pre-Mobility: 72 HR During Mobility: 84 HR Post-Mobility: 74 HR  Pt received in the bed and agreeable to ambulation. C/o SOB during session, otherwise asymptomatic. Pt back to bed after session with call bell and phone at her side.   [facility] Roneka Gilpin Mobility Specialist Mobility Specialist 4 [address]: [phone]

## 2021-09-02 NOTE — Hospital Course (Signed)
Laurie Bowman was admitted to the hospital with the working diagnosis of rhabdomyolysis.   81 yo female with the past medical history of asthma, hypertension, CKD and dyslipidemia, who presented with body aches and weakness. Reported 2 to 3 days of persistent symptoms. She sustained a mechanical fall at home and was wedged between the bed and the nightstand. EMS was called and she was transported to the hospital. On her initial physical examination her blood pressure was 125/72, HR 82, RR 12 and 02 saturation 97%, lungs with no wheezing or rales, heart with S1 and S2 present and rhythmic with no gallops, abdomen not distended, no lower extremity edema but tender muscles to palpation.   Na 136, K 3,0 Cl 102 bicarbonate 25, glucose 103 bun 11 cr 10  CK 151, 587, 643, 269  High sensitive troponin 33  Urine analysis with SG 1.020 Negative hgb dipstick.   Chest radiograph with cardiomegaly with infiltrates.   EKG 84 bpm, left axis deviation, left bundle branch block, sinus rhythm with poor R wave progression, J point elevation in V3 and V4 with no significant T wave changes.   Patient received K supplementation and IV fluids. She developed atrial fibrillation with rapid ventricular response.  IV fluids were discontinued and she was placed on diltiazem infusion for heart rate control.   Cardiology was consulted and she was placed on amiodarone and IV heparin.  Converted to sinus rhythm.

## 2021-09-02 NOTE — Progress Notes (Signed)
DAILY PROGRESS NOTE   Patient Name: Laurie Bowman Date of Encounter: 09/02/2021 Cardiologist: Shirlee More, MD  Chief Complaint   No complaints  Patient Profile   Laurie Bowman is a 81 y.o. female with a hx of hypertrophic cardiomyopathy who is being seen 08/31/2021 for the evaluation of atrial fibrillation at the request of Dr Cyndia Skeeters.  Subjective   Maintaining sinus rhythm on oral amiodarone. Echo yesterday shows LVEF 50-55%, severe basal septal hypertrophy without LVOT obstruction.  Objective   Vitals:   09/01/21 2025 09/02/21 0546 09/02/21 0906 09/02/21 1114  BP: (!) 162/97 133/67  (!) 172/100  Pulse: 78 65  65  Resp: '17 16  18  '$ Temp: 98.3 F (36.8 C) 98.3 F (36.8 C)  97.9 F (36.6 C)  TempSrc: Oral Oral  Oral  SpO2: 97% 96% 97% 97%  Weight:  67.5 kg    Height:        Intake/Output Summary (Last 24 hours) at 09/02/2021 1212 Last data filed at 09/02/2021 4098 [place] per 24 hour  Intake 935.93 ml  Output 1350 ml  Net -414.07 ml   Filed Weights   08/31/21 0121 09/01/21 0254 09/02/21 0546  Weight: 68.8 kg 67.2 kg 67.5 kg    Physical Exam   General appearance: alert and no distress Neck: no carotid bruit, no JVD, and thyroid not enlarged, symmetric, no tenderness/mass/nodules Lungs: clear to auscultation bilaterally Heart: regular rate and rhythm, S1, S2 normal, and systolic murmur: systolic ejection 3/6, crescendo at 2nd right intercostal space Abdomen: soft, non-tender; bowel sounds normal; no masses,  no organomegaly Extremities: extremities normal, atraumatic, no cyanosis or edema Pulses: 2+ and symmetric Skin: Skin color, texture, turgor normal. No rashes or lesions Neurologic: Grossly normal Psych: pleasant  Inpatient Medications    Scheduled Meds:  amiodarone  200 mg Oral BID   aspirin  81 mg Oral Daily   budesonide (PULMICORT) nebulizer solution  0.25 mg Nebulization BID   buPROPion  150 mg Oral Daily   calcium-vitamin D  1 tablet Oral Q breakfast    pantoprazole  40 mg Oral Daily   sodium chloride flush  3 mL Intravenous Q12H    Continuous Infusions:  sodium chloride 10 mL/hr at 08/31/21 1843   diltiazem (CARDIZEM) infusion Stopped (08/31/21 2328)   ferric gluconate (FERRLECIT) IVPB     heparin 1,050 Units/hr (09/02/21 0610)    PRN Meds: acetaminophen **OR** acetaminophen, dicyclomine, levalbuterol, nitroGLYCERIN, polyethylene glycol, traMADol   Labs   Results for orders placed or performed during the hospital encounter of 08/29/21 (from the past 48 hour(s))  Brain natriuretic peptide     Status: Abnormal   Collection Time: 08/31/21  3:09 PM  Result Value Ref Range   B Natriuretic Peptide 655.8 (H) 0.0 - 100.0 pg/mL    Comment: Performed at [facility], [address], [place], [place] [address]  Troponin I (High Sensitivity)     Status: Abnormal   Collection Time: 08/31/21  3:09 PM  Result Value Ref Range   Troponin I (High Sensitivity) 32 (H) <18 ng/L    Comment: (NOTE) Elevated high sensitivity troponin I (hsTnI) values and significant  changes across serial measurements may suggest ACS but many other  chronic and acute conditions are known to elevate hsTnI results.  Refer to the "Links" section for chest pain algorithms and additional  guidance. Performed at [facility], [place] [address] [place], [place] [address]   Troponin I (High Sensitivity)  Status: Abnormal   Collection Time: 08/31/21  4:38 PM  Result Value Ref Range   Troponin I (High Sensitivity) 36 (H) <18 ng/L    Comment: (NOTE) Elevated high sensitivity troponin I (hsTnI) values and significant  changes across serial measurements may suggest ACS but many other  chronic and acute conditions are known to elevate hsTnI results.  Refer to the "Links" section for chest pain algorithms and additional  guidance. Performed at [facility], [place] [address] [place], [place] [address]   TSH     Status: None   Collection Time: 08/31/21   4:38 PM  Result Value Ref Range   TSH 3.744 0.350 - 4.500 uIU/mL    Comment: Performed by a 3rd Generation assay with a functional sensitivity of <=0.01 uIU/mL. Performed at [facility], [place] [address], [place], [place] [address]   CBC     Status: Abnormal   Collection Time: 09/01/21  2:29 AM  Result Value Ref Range   WBC 10.5 4.0 - 10.5 K/uL   RBC 3.84 (L) 3.87 - 5.11 MIL/uL   Hemoglobin 12.5 12.0 - 15.0 g/dL   HCT 36.1 36.0 - 46.0 %   MCV 94.0 80.0 - 100.0 fL   MCH 32.6 26.0 - 34.0 pg   MCHC 34.6 30.0 - 36.0 g/dL   RDW 12.8 11.5 - 15.5 %   Platelets 196 150 - 400 K/uL   nRBC 0.0 0.0 - 0.2 %    Comment: Performed at [facility], [place] [address], [place], [place] [address]  Comprehensive metabolic panel     Status: Abnormal   Collection Time: 09/01/21  2:29 AM  Result Value Ref Range   Sodium 135 135 - 145 mmol/L   Potassium 3.0 (L) 3.5 - 5.1 mmol/L    Comment: DELTA CHECK NOTED   Chloride 100 98 - 111 mmol/L   CO2 24 22 - 32 mmol/L   Glucose, Bld 121 (H) 70 - 99 mg/dL    Comment: Glucose reference range applies only to samples taken after fasting for at least 8 hours.   BUN 11 8 - 23 mg/dL   Creatinine, Ser 1.00 0.44 - 1.00 mg/dL   Calcium 8.6 (L) 8.9 - 10.3 mg/dL   Total Protein 6.2 (L) 6.5 - 8.1 g/dL   Albumin 3.2 (L) 3.5 - 5.0 g/dL   AST 44 (H) 15 - 41 U/L   ALT 38 0 - 44 U/L   Alkaline Phosphatase 55 38 - 126 U/L   Total Bilirubin 0.8 0.3 - 1.2 mg/dL   GFR, Estimated 57 (L) >60 mL/min    Comment: (NOTE) Calculated using the CKD-EPI Creatinine Equation (2021)    Anion gap 11 5 - 15    Comment: Performed at [facility], [place] [address] [place], [place] [address]  Magnesium     Status: None   Collection Time: 09/01/21  2:29 AM  Result Value Ref Range   Magnesium 1.8 1.7 - 2.4 mg/dL    Comment: Performed at [place] [address] [place], [place] [address]  CK     Status: Abnormal   Collection Time: 09/01/21  2:29 AM  Result Value  Ref Range   Total CK 269 (H) 38 - 234 U/L    Comment: Performed at [facility], [place] [address], [place], [place] [address]  Heparin level (unfractionated)     Status: None   Collection Time: 09/01/21  1:49 PM  Result Value Ref Range   Heparin Unfractionated 0.31 0.30 - 0.70 IU/mL    Comment: (NOTE) The clinical reportable range upper limit is being lowered to >1.10 to align with the FDA approved guidance for the current laboratory assay.  If heparin results are below expected values, and patient dosage has  been confirmed, suggest follow up testing of antithrombin III levels. Performed at [facility], [place] [address], [place], [place] [address]   CBC     Status: Abnormal   Collection Time: 09/02/21  3:24 AM  Result Value Ref Range   WBC 7.9 4.0 - 10.5 K/uL   RBC 3.39 (L) 3.87 - 5.11 MIL/uL   Hemoglobin 11.1 (L) 12.0 - 15.0 g/dL   HCT 32.5 (L) 36.0 - 46.0 %   MCV 95.9 80.0 - 100.0 fL   MCH 32.7 26.0 - 34.0 pg   MCHC 34.2 30.0 - 36.0 g/dL   RDW 12.7 11.5 - 15.5 %   Platelets 186 150 - 400 K/uL   nRBC 0.0 0.0 - 0.2 %    Comment: Performed at [facility], [place] [address], [place], [place] [address]  Heparin level (unfractionated)     Status: Abnormal   Collection Time: 09/02/21  3:24 AM  Result Value Ref Range   Heparin Unfractionated 0.14 (L) 0.30 - 0.70 IU/mL    Comment: (NOTE) The clinical reportable range upper limit is being lowered to >1.10 to align with the FDA approved guidance for the current laboratory assay.  If heparin results are below expected values, and patient dosage has  been confirmed, suggest follow up testing of antithrombin III levels. Performed at [facility], [place] [address] [place], [place] [address]   Renal function panel     Status: Abnormal   Collection Time: 09/02/21  3:24 AM  Result Value Ref Range   Sodium 135 135 - 145 mmol/L   Potassium 4.2 3.5 - 5.1 mmol/L    Comment: NO VISIBLE HEMOLYSIS   Chloride 107 98 -  111 mmol/L   CO2 21 (L) 22 - 32 mmol/L   Glucose, Bld 88 70 - 99 mg/dL    Comment: Glucose reference range applies only to samples taken after fasting for at least 8 hours.   BUN 9 8 - 23 mg/dL   Creatinine, Ser 1.08 (H) 0.44 - 1.00 mg/dL   Calcium 8.7 (L) 8.9 - 10.3 mg/dL   Phosphorus 2.0 (L) 2.5 - 4.6 mg/dL   Albumin 2.8 (L) 3.5 - 5.0 g/dL   GFR, Estimated 52 (L) >60 mL/min    Comment: (NOTE) Calculated using the CKD-EPI Creatinine Equation (2021)    Anion gap 7 5 - 15    Comment: Performed at [place] [address] [place], [place] [address]  Magnesium     Status: None   Collection Time: 09/02/21  3:24 AM  Result Value Ref Range   Magnesium 1.8 1.7 - 2.4 mg/dL    Comment: Performed at [place] [address] [place],  [address]    ECG   N/A  Telemetry   Sinus rhythm - Personally Reviewed  Radiology    ECHOCARDIOGRAM COMPLETE  Result Date: 09/01/2021    ECHOCARDIOGRAM REPORT   Patient Name:   Laurie Bowman Date of Exam: 09/01/2021 Medical Rec #:  [id number]    Height:       60.0 in Accession #:    [id number]   Weight:  148.1 lb Date of Birth:  1940/05/28    BSA:          1.643 m Patient Age:    25 years     BP:           118/77 mmHg Patient Gender: F            HR:           62 bpm. Exam Location:  Inpatient Procedure: 2D Echo and Intracardiac Opacification Agent Indications:    dyspnea  History:        Patient has prior history of Echocardiogram examinations.                 Hypertrophic Cardiomyopathy; Arrythmias:Atrial Fibrillation and                 LBBB.  Sonographer:    [place] Referring Phys: [id number] TAYE T GONFA IMPRESSIONS  1. Left ventricular ejection fraction, by estimation, is 50 to 55%. The left ventricle has low normal function. The left ventricle has no regional wall motion abnormalities, save fo septal changes realted to conduction delay. There is severe asymmetric left ventricular hypertrophy of the basal-septal  segment (15 mm thickness of septal bulge). Left ventricular diastolic parameters are consistent with Grade I diastolic dysfunction (impaired relaxation). No LVOT Obstruction seen in this study.  2. Right ventricular systolic function is hyperdynamic. The right ventricular size is normal. There is normal pulmonary artery systolic pressure. The estimated right ventricular systolic pressure is 41.3 mmHg.  3. The pericardial effusion is posterior to the left ventricle.  4. The mitral valve is normal in structure. Mild mitral valve regurgitation. No evidence of mitral stenosis.  5. The aortic valve is tricuspid. There is mild calcification of the aortic valve. There is mild thickening of the aortic valve. Aortic valve regurgitation is not visualized. Aortic valve sclerosis is present, with no evidence of aortic valve stenosis. Comparison(s): Unclear change in LVEF: LVEF is worse from prior on contrast portion, contrast not done in prior imaging. FINDINGS  Left Ventricle: Left ventricular ejection fraction, by estimation, is 50 to 55%. The left ventricle has low normal function. The left ventricle has no regional wall motion abnormalities. Definity contrast agent was given IV to delineate the left ventricular endocardial borders. The left ventricular internal cavity size was normal in size. There is severe asymmetric left ventricular hypertrophy of the basal-septal segment. Abnormal (paradoxical) septal motion, consistent with left bundle branch block. Left ventricular diastolic parameters are consistent with Grade I diastolic dysfunction (impaired relaxation). Right Ventricle: The right ventricular size is normal. No increase in right ventricular wall thickness. Right ventricular systolic function is hyperdynamic. There is normal pulmonary artery systolic pressure. The tricuspid regurgitant velocity is 2.84 m/s, and with an assumed right atrial pressure of 3 mmHg, the estimated right ventricular systolic pressure is 24.4  mmHg. Left Atrium: Left atrial size was normal in size. Right Atrium: Right atrial size was normal in size. Pericardium: Trivial pericardial effusion is present. The pericardial effusion is posterior to the left ventricle. Presence of epicardial fat layer. Mitral Valve: The mitral valve is normal in structure. Mild mitral valve regurgitation. No evidence of mitral valve stenosis. Tricuspid Valve: The tricuspid valve is normal in structure. Tricuspid valve regurgitation is mild . No evidence of tricuspid stenosis. Aortic Valve: The aortic valve is tricuspid. There is mild calcification of the aortic valve. There is mild thickening of the aortic valve. There is mild aortic valve annular calcification.  Aortic valve regurgitation is not visualized. Aortic valve sclerosis is present, with no evidence of aortic valve stenosis. Pulmonic Valve: The pulmonic valve was not well visualized. Pulmonic valve regurgitation is trivial. No evidence of pulmonic stenosis. Aorta: The aortic root is normal in size and structure. Pulmonary Artery: The pulmonary artery is of normal size. IAS/Shunts: No atrial level shunt detected by color flow Doppler.  LEFT VENTRICLE PLAX 2D LVIDd:         4.50 cm     Diastology LVIDs:         3.40 cm     LV e' medial:    9.30 cm/s LV PW:         1.10 cm     LV E/e' medial:  9.4 LV IVS:        1.10 cm     LV e' lateral:   4.58 cm/s LVOT diam:     1.40 cm     LV E/e' lateral: 19.1 LV SV:         50 LV SV Index:   30 LVOT Area:     1.54 cm  LV Volumes (MOD) LV vol d, MOD A4C: 55.2 ml LV vol s, MOD A4C: 34.3 ml LV SV MOD A4C:     55.2 ml RIGHT VENTRICLE             IVC RV S prime:     12.80 cm/s  IVC diam: 1.60 cm LEFT ATRIUM             Index LA diam:        4.00 cm 2.43 cm/m LA Vol (A2C):   52.6 ml 32.02 ml/m LA Vol (A4C):   32.9 ml 20.03 ml/m LA Biplane Vol: 42.2 ml 25.69 ml/m  AORTIC VALVE LVOT Vmax:   155.00 cm/s LVOT Vmean:  101.000 cm/s LVOT VTI:    0.322 m  AORTA Ao Root diam: 2.70 cm Ao Asc  diam:  2.60 cm MITRAL VALVE                TRICUSPID VALVE MV Area (PHT): 2.91 cm     TR Peak grad:   32.3 mmHg MV Decel Time: 261 msec     TR Vmax:        284.00 cm/s MV E velocity: 87.50 cm/s MV A velocity: 104.00 cm/s  SHUNTS MV E/A ratio:  0.84         Systemic VTI:  0.32 m                             Systemic Diam: 1.40 cm Rudean Haskell MD Electronically signed by Rudean Haskell MD Signature Date/Time: 09/01/2021/1:35:55 PM    Final    DG Chest Port 1 View  Result Date: 08/31/2021 CLINICAL DATA:  Dyspnea. EXAM: PORTABLE CHEST 1 VIEW COMPARISON:  Radiographs 08/29/2021 and 02/12/2021.  CT 09/17/2020. FINDINGS: 1508 hours. Stable cardiomegaly and aortic atherosclerosis. Stable mild chronic interstitial prominence without evidence of superimposed edema, confluent airspace opacity, pneumothorax or significant pleural effusion. The bones appear unchanged. No acute osseous findings are evident. IMPRESSION: Stable cardiomegaly and chronic interstitial lung disease. No acute process identified. Electronically Signed   By: Richardean Sale M.D.   On: 08/31/2021 15:23    Cardiac Studies   Echo pending  Assessment   Principal Problem:   Generalized weakness Active Problems:   Moderate persistent asthma   GERD (gastroesophageal reflux disease)   Hypertrophic  cardiomyopathy (HCC)   Benign essential hypertension   Hyperlipidemia, unspecified   Irritable bowel syndrome   Anxiety and depression   Hypokalemia   Elevated troponin   Myalgia   Hypomagnesemia   Elevated AST (SGOT)   Rhabdomyolysis   Dehydration   Paroxysmal atrial fibrillation with RVR (HCC)   Plan   Maintaining sinus rhythm - low normal LVEF on echo, possibly rate-related. On oral amiodarone 200 mg BID x 7 days, then 200 mg daily. Ok to switch to DOAC today, will start Eliquis 5 mg BID.  Time Spent Directly with Patient:  I have spent a total of 25 minutes with the patient reviewing hospital notes, telemetry, EKGs,  labs and examining the patient as well as establishing an assessment and plan that was discussed personally with the patient.  > 50% of time was spent in direct patient care.  Length of Stay:  LOS: 2 days   Pixie Casino, MD, [facility], [place] Director of the Advanced Lipid Disorders &  Cardiovascular Risk Reduction Clinic Diplomate of the American Board of Clinical Lipidology Attending Cardiologist  Direct Dial: [phone]  Fax: [phone]  Website:  [email or web address] 09/02/2021, 12:12 PM

## 2021-09-02 NOTE — Progress Notes (Signed)
Occupational Therapy Treatment Patient Details Name: Laurie Bowman MRN: [id number] DOB: 1940/09/08 Today's Date: 09/02/2021   History of present illness Pt is a 81 y.o. female adm 6/14 after fall at home. Pt had recevied osteoposis infusion earlier and then developed body aches and weakness. Weakness thought likely due to Reclast infusion.  PMHx: of osteoporosis, asthma, GERD, hypertension, CKD 2, alopecia, hyperlipidemia, IBS, depression, breast cancer.   OT comments  Pt willing to get OOB with OT. Pt did share husband is not able to help her much.  Son from [place] will be available PRN.  Husband with limited ability to A.  Varie was doing all the cooking and ADL actviity   Recommendations for follow up therapy are one component of a multi-disciplinary discharge planning process, led by the attending physician.  Recommendations may be updated based on patient status, additional functional criteria and insurance authorization.    Follow Up Recommendations  Home health OT    Assistance Recommended at Discharge Set up Supervision/Assistance  Patient can return home with the following  A little help with walking and/or transfers;A little help with bathing/dressing/bathroom             Mobility Bed Mobility Overal bed mobility: Needs Assistance       Supine to sit: Min guard          Transfers Overall transfer level: Needs assistance Equipment used: 1 person hand held assist Transfers: Sit to/from Stand Sit to Stand: Min assist                     ADL either performed or assessed with clinical judgement   ADL   Eating/Feeding: Set up;Sitting (using left hand due to R arm being sore)   Grooming: Brushing hair;Minimal assistance (with L hand)                   Toilet Transfer: Min guard;Ambulation   Toileting- Clothing Manipulation and Hygiene: Min guard;Sitting/lateral lean;Sit to/from stand       Functional mobility during ADLs: Min guard;Cueing for  safety      Extremity/Trunk Assessment Upper Extremity Assessment RUE Deficits / Details: soreness from IV placement and bloodwork RUE Coordination: decreased fine motor            Vision Baseline Vision/History: 1 Wears glasses            Cognition Arousal/Alertness: Awake/alert Behavior During Therapy: WFL for tasks assessed/performed Overall Cognitive Status: Within Functional Limits for tasks assessed                                                     Pertinent Vitals/ Pain       Pain Assessment Pain Score: 3  Pain Location: R arm Pain Descriptors / Indicators: Grimacing, Sore Pain Intervention(s): Limited activity within patient's tolerance, Repositioned      Progress Toward Goals  OT Goals(current goals can now be found in the care plan section)  Progress towards OT goals: Progressing toward goals  Acute Rehab OT Goals OT Goal Formulation: With patient Time For Goal Achievement: 09/13/21 Potential to Achieve Goals: Good  Plan Discharge plan remains appropriate       AM-PAC OT "6 Clicks" Daily Activity     Outcome Measure   Help from another person eating meals?: None Help from another person  taking care of personal grooming?: A Little Help from another person toileting, which includes using toliet, bedpan, or urinal?: A Little Help from another person bathing (including washing, rinsing, drying)?: A Little Help from another person to put on and taking off regular upper body clothing?: A Little Help from another person to put on and taking off regular lower body clothing?: A Little 6 Click Score: 19    End of Session    OT Visit Diagnosis: Unsteadiness on feet (R26.81)   Activity Tolerance Patient tolerated treatment well   Patient Left with call bell/phone within reach;in chair   Nurse Communication Mobility status        Time: 0930-1010 OT Time Calculation (min): 40 min  Charges: OT General Charges $OT Visit: 1  Visit OT Treatments $Self Care/Home Management : 23-37 mins  Kari Baars, OT Acute Rehabilitation Services [phone] Office- [phone]     Shondra Capps, Edwena Felty D 09/02/2021, 11:05 AM

## 2021-09-02 NOTE — Assessment & Plan Note (Signed)
Uncontrolled hypertension.  Calcium channel blockade was discontinued and patient has placed on HCTZ and losartan for blood pressure control with good toleration.   Plan to follow up as outpatient.

## 2021-09-02 NOTE — Assessment & Plan Note (Signed)
Follow as outpatient,

## 2021-09-02 NOTE — Discharge Instructions (Signed)
Information on my medicine - ELIQUIS (apixaban)  This medication education was reviewed with me or my healthcare representative as part of my discharge preparation.  The pharmacist that spoke with me during my hospital stay was:  Ursula Beath, [facility]  Why was Eliquis prescribed for you? Eliquis was prescribed for you to reduce the risk of a blood clot forming that can cause a stroke if you have a medical condition called atrial fibrillation (a type of irregular heartbeat).  What do You need to know about Eliquis ? Take your Eliquis TWICE DAILY - one tablet in the morning and one tablet in the evening with or without food. If you have difficulty swallowing the tablet whole please discuss with your pharmacist how to take the medication safely.  Take Eliquis exactly as prescribed by your doctor and DO NOT stop taking Eliquis without talking to the doctor who prescribed the medication.  Stopping may increase your risk of developing a stroke.  Refill your prescription before you run out.  After discharge, you should have regular check-up appointments with your healthcare provider that is prescribing your Eliquis.  In the future your dose may need to be changed if your kidney function or weight changes by a significant amount or as you get older.  What do you do if you miss a dose? If you miss a dose, take it as soon as you remember on the same day and resume taking twice daily.  Do not take more than one dose of ELIQUIS at the same time to make up a missed dose.  Important Safety Information A possible side effect of Eliquis is bleeding. You should call your healthcare provider right away if you experience any of the following: Bleeding from an injury or your nose that does not stop. Unusual colored urine (red or dark brown) or unusual colored stools (red or black). Unusual bruising for unknown reasons. A serious fall or if you hit your head (even if there is no bleeding).  Some medicines  may interact with Eliquis and might increase your risk of bleeding or clotting while on Eliquis. To help avoid this, consult your healthcare provider or pharmacist prior to using any new prescription or non-prescription medications, including herbals, vitamins, non-steroidal anti-inflammatory drugs (NSAIDs) and supplements.  This website has more information on Eliquis (apixaban): http://www.eliquis.com/eliquis/home

## 2021-09-02 NOTE — Assessment & Plan Note (Signed)
Hypomagnesemia.   Renal function stable with serum cr at 1.0. K has been corrected to 4,2 and serum bicarbonate is 21. Mg is 1,8.   Plan to add 2 g mag sulfate today and follow up renal function and electrolytes in am.  Continue to hold on IV fluids.  No clinical signs of myoglobinuria.

## 2021-09-02 NOTE — Progress Notes (Signed)
[place] for Enoxaparin>>heparin Indication: atrial fibrillation  Allergies  Allergen Reactions   Cefaclor Itching    Other reaction(s): Flushing (ALLERGY/intolerance)   Codeine Itching and Nausea And Vomiting   Fructose Swelling    Swelling and GI upset, Fructose Granules    Sulfa Antibiotics Swelling   Meperidine Nausea And Vomiting and Itching   Elemental Sulfur Swelling    Patient Measurements: Height: 5' (152.4 cm) Weight: 67.2 kg (148 lb 1.6 oz) IBW/kg (Calculated) : 45.5 Heparin Dosing Weight: 60.5 kg   Vital Signs: Temp: 98.3 F (36.8 C) (06/17 2025) Temp Source: Oral (06/17 2025) BP: 162/97 (06/17 2025) Pulse Rate: 78 (06/17 2025)  Labs: Recent Labs    08/31/21 0343 08/31/21 1509 08/31/21 1638 09/01/21 0229 09/01/21 1349 09/02/21 0324  HGB 11.8*  --   --  12.5  --  11.1*  HCT 35.2*  --   --  36.1  --  32.5*  PLT 145*  --   --  196  --  186  HEPARINUNFRC  --   --   --   --  0.31 0.14*  CREATININE 0.95  --   --  1.00  --   --   CKTOTAL 643*  --   --  269*  --   --   TROPONINIHS  --  32* 36*  --   --   --      Estimated Creatinine Clearance: 39 mL/min (by C-G formula based on SCr of 1 mg/dL).   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   Hypokalemia 09/27/2020   Infection due to Staphylococcus epidermidis 09/27/2020   Irritable bowel syndrome 12/16/2013   LBBB (left bundle branch block) 03/23/2692   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   Major depressive disorder, recurrent, unspecified ([place]) 12/16/2013   Mitral regurgitation 04/30/2018   Moderate persistent asthma 09/28/2015   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 Pharmacy consulted for heparin for Afib. No anticoagulation prior to admission. Hgb 12.5. Plts 196. Heparin level is therapeutic at 0.31. Pt received dose of lovenox on 06/16 at  1700. No bleeding noted.   6/18 AM update:  Heparin level low  Goal of Therapy:  Heparin level: 0.3-0.7 Monitor platelets by anticoagulation protocol: Yes   Plan:  Inc heparin to 1050 units/hr 1200 heparin level Follow up transition to oral anticoagulant   Narda Bonds, PharmD, [place] Pharmacist Phone: [phone]

## 2021-09-02 NOTE — Assessment & Plan Note (Addendum)
Patient was placed on IV fluids with good toleration.  Renal function remained stable.  At the time of her discharge it has been resolved.   Patient was evaluated by PT and OT and will continue with home health services.

## 2021-09-03 ENCOUNTER — Telehealth: Payer: Self-pay

## 2021-09-03 ENCOUNTER — Other Ambulatory Visit (HOSPITAL_COMMUNITY): Payer: Self-pay

## 2021-09-03 DIAGNOSIS — I480 Paroxysmal atrial fibrillation: Secondary | ICD-10-CM | POA: Diagnosis not present

## 2021-09-03 DIAGNOSIS — F419 Anxiety disorder, unspecified: Secondary | ICD-10-CM | POA: Diagnosis not present

## 2021-09-03 DIAGNOSIS — T796XXA Traumatic ischemia of muscle, initial encounter: Secondary | ICD-10-CM | POA: Diagnosis not present

## 2021-09-03 DIAGNOSIS — I10 Essential (primary) hypertension: Secondary | ICD-10-CM | POA: Diagnosis not present

## 2021-09-03 DIAGNOSIS — K219 Gastro-esophageal reflux disease without esophagitis: Secondary | ICD-10-CM | POA: Diagnosis not present

## 2021-09-03 LAB — BASIC METABOLIC PANEL
Anion gap: 11 (ref 5–15)
BUN: 11 mg/dL (ref 8–23)
CO2: 23 mmol/L (ref 22–32)
Calcium: 10.2 mg/dL (ref 8.9–10.3)
Chloride: 103 mmol/L (ref 98–111)
Creatinine, Ser: 1.17 mg/dL — ABNORMAL HIGH (ref 0.44–1.00)
GFR, Estimated: 47 mL/min — ABNORMAL LOW (ref >60)
Glucose, Bld: 96 mg/dL (ref 70–99)
Potassium: 4.1 mmol/L (ref 3.5–5.1)
Sodium: 137 mmol/L (ref 135–145)

## 2021-09-03 MED ORDER — HYDROCHLOROTHIAZIDE 25 MG PO TABS
25.0000 mg | ORAL_TABLET | Freq: Every day | ORAL | 0 refills | Status: DC
  Filled 2021-09-03: qty 30, 30d supply, fill #0

## 2021-09-03 MED ORDER — AMIODARONE HCL 200 MG PO TABS
ORAL_TABLET | ORAL | 0 refills | Status: DC
  Filled 2021-09-03: qty 30, 26d supply, fill #0

## 2021-09-03 MED ORDER — LOSARTAN POTASSIUM 25 MG PO TABS
25.0000 mg | ORAL_TABLET | Freq: Every day | ORAL | 0 refills | Status: DC
  Filled 2021-09-03: qty 30, 30d supply, fill #0

## 2021-09-03 MED ORDER — APIXABAN 5 MG PO TABS
5.0000 mg | ORAL_TABLET | Freq: Two times a day (BID) | ORAL | 0 refills | Status: DC
  Filled 2021-09-03: qty 60, 30d supply, fill #0

## 2021-09-03 NOTE — Discharge Summary (Signed)
Physician Discharge Summary   Patient: Laurie Bowman MRN: [id number] DOB: 05-16-1940  Admit date:     08/29/2021  Discharge date: 09/03/21  Discharge Physician: Jimmy Picket Klover Priestly   PCP: Derrill Center., MD   Recommendations at discharge:    Patient has been placed on amiodarone for rate and rhythm control atrial fibrillation. 200 mg bid until 09/07/21 than on 09/08/21 start taking 200 mg daily.  Anticoagulation with apixaban. (Discontinue aspirin to decrease risk of bleeding).  Blood pressure control with losartan and HCTZ.  Follow up as outpatient   Discharge Diagnoses: Principal Problem:   Rhabdomyolysis Active Problems:   Essential hypertension   Hypertrophic cardiomyopathy (HCC)   GERD (gastroesophageal reflux disease)   Hypokalemia   Paroxysmal atrial fibrillation with RVR (HCC)   Hyperlipidemia, unspecified   Anxiety and depression  Resolved Problems:   * No resolved hospital problems. [facility] Course: Laurie Bowman was admitted to the hospital with the working diagnosis of rhabdomyolysis.   81 yo female with the past medical history of asthma, hypertension, CKD and dyslipidemia, who presented with body aches and weakness. Reported 2 to 3 days of persistent symptoms. She sustained a mechanical fall at home and was wedged between the bed and the nightstand. EMS was called and she was transported to the hospital. On her initial physical examination her blood pressure was 125/72, HR 82, RR 12 and 02 saturation 97%, lungs with no wheezing or rales, heart with S1 and S2 present and rhythmic with no gallops, abdomen not distended, no lower extremity edema but tender muscles to palpation.   Na 136, K 3,0 Cl 102 bicarbonate 25, glucose 103 bun 11 cr 10  CK 151, 587, 643, 269  High sensitive troponin 33  Urine analysis with SG 1.020 Negative hgb dipstick.   Chest radiograph with cardiomegaly with infiltrates.   EKG 84 bpm, left axis deviation, left bundle branch block,  sinus rhythm with poor R wave progression, J point elevation in V3 and V4 with no significant T wave changes.   Patient received K supplementation and IV fluids. She developed atrial fibrillation with rapid ventricular response.  IV fluids were discontinued and she was placed on diltiazem infusion for heart rate control.   Cardiology was consulted and she was placed on amiodarone and IV heparin.  Converted to sinus rhythm.     Assessment and Plan: * Rhabdomyolysis Patient was placed on IV fluids with good toleration.  Renal function remained stable.  At the time of her discharge it has been resolved.   Patient was evaluated by PT and OT and will continue with home health services.     Essential hypertension Uncontrolled hypertension.  Calcium channel blockade was discontinued and patient has placed on HCTZ and losartan for blood pressure control with good toleration.   Plan to follow up as outpatient.   Hypertrophic cardiomyopathy (HCC) No signs of decompensated heart failure,   Echocardiogram with preserved LV systolic function with no dynamic obstruction. Severe asymmetric left ventricular hypertrophy of the basal septal segment. No LVOT obstruction seen. Right ventricular systolic function is preserved. RVSP 35.3 mmHg. No significant valvular disease.    Continue with blood pressure control with losartan and HZTC.  Follow up as outpatient.   Paroxysmal atrial fibrillation with RVR ([place]) Patient has converted to sinus rhythm.  Echocardiogram with preserved LV systolic function. EF 50 to 55%, with severe asymmetric left ventricular hypertrophy of the basal septal segment. No LVOT obstruction seen. Preserved RV systolic function. RVSP  35.3 mmHg. Trivial pericardial effusion.   Continue rhythm control with amiodarone, in the setting of hypertrophic cardiomyopathy patient benefits from rhythm control. Continue anticoagulation with apixaban. Plan to follow up as outpatient.    Hypokalemia Hypomagnesemia.   Electrolytes were corrected. At the time of her discharge her renal function has a serum cr of 1.17 with K at 4,1 and serum bicarbonate at 23.  Plan to continue blood pressure control and K supplementation.  Follow up electrolytes and renal function as outpatient.   GERD (gastroesophageal reflux disease) Continue with pantoprazole.   Hyperlipidemia, unspecified Follow as outpatient,   Anxiety and depression Continue with buspirone         Consultants: cardiology  Procedures performed: none   Disposition: Home Diet recommendation:  Cardiac diet DISCHARGE MEDICATION: Allergies as of 09/03/2021       Reactions   Cefaclor Itching   Other reaction(s): Flushing (ALLERGY/intolerance)   Codeine Itching, Nausea And Vomiting   Fructose Swelling   Swelling and GI upset, Fructose Granules    Sulfa Antibiotics Swelling   Meperidine Nausea And Vomiting, Itching   Elemental Sulfur Swelling        Medication List     STOP taking these medications    alendronate 70 MG tablet Commonly known as: FOSAMAX   aspirin 81 MG chewable tablet   calcium carbonate 600 MG Tabs tablet Commonly known as: OS-CAL   traMADol 50 MG tablet Commonly known as: ULTRAM   verapamil 180 MG CR tablet Commonly known as: CALAN-SR       TAKE these medications    albuterol 108 (90 Base) MCG/ACT inhaler Commonly known as: ProAir HFA Use 1-2 puffs every 4-6 hours as needed for cough or wheeze.   albuterol (2.5 MG/3ML) 0.083% nebulizer solution Commonly known as: PROVENTIL Take 3 mLs (2.5 mg total) by nebulization every 6 (six) hours as needed for wheezing or shortness of breath.   amiodarone 200 MG tablet Commonly known as: PACERONE Take twice daily until 09/07/21 and starting om 09/08/21 start taking once daily.   apixaban 5 MG Tabs tablet Commonly known as: ELIQUIS Take 1 tablet (5 mg total) by mouth 2 (two) times daily.   augmented betamethasone  dipropionate 0.05 % cream Commonly known as: DIPROLENE-AF Apply 9.47 application topically daily.   benzonatate 100 MG capsule Commonly known as: TESSALON Take 100 mg by mouth daily as needed for cough.   Biotin 1000 MCG tablet Take 1,000 mcg by mouth daily.   buPROPion 150 MG 12 hr tablet Commonly known as: WELLBUTRIN SR Take 150 mg by mouth daily.   calcium-vitamin D 500-200 MG-UNIT Tabs tablet Commonly known as: OSCAL WITH D Take 1 tablet by mouth daily.   clobetasol cream 0.05 % Commonly known as: TEMOVATE Apply 1 application. topically daily.   clotrimazole-betamethasone cream Commonly known as: LOTRISONE Apply 1 application. topically 2 (two) times daily.   diclofenac Sodium 1 % Gel Commonly known as: VOLTAREN Apply 2 g topically 4 (four) times daily. Both knees   dicyclomine 20 MG tablet Commonly known as: BENTYL Take 20 mg by mouth daily as needed for spasms.   fexofenadine 180 MG tablet Commonly known as: ALLEGRA Take 180 mg by mouth daily.   fluocinonide cream 0.05 % Commonly known as: LIDEX Apply 1 application topically 2 (two) times daily.   fluticasone 110 MCG/ACT inhaler Commonly known as: FLOVENT HFA Inhale 2 puffs into the lungs 2 (two) times daily. Use with spacer. What changed:  when to take this  reasons to take this additional instructions   hydrochlorothiazide 25 MG tablet Commonly known as: HYDRODIURIL Take 1 tablet (25 mg total) by mouth daily. Start taking on: September 04, 2021 What changed:  medication strength how much to take   lansoprazole 30 MG capsule Commonly known as: PREVACID Take 1 capsule (30 mg total) by mouth 2 (two) times daily before a meal.   losartan 25 MG tablet Commonly known as: COZAAR Take 1 tablet (25 mg total) by mouth daily. Start taking on: September 04, 2021   montelukast 10 MG tablet Commonly known as: SINGULAIR TAKE 1 TABLET BY MOUTH EVERYDAY AT BEDTIME What changed:  how much to take how to take  this when to take this additional instructions   olopatadine 0.1 % ophthalmic solution Commonly known as: Patanol Place 1 drop into both eyes 2 (two) times daily as needed.   PEG 3350 17 GM/SCOOP Powd Take 17 g by mouth daily.   potassium chloride 10 MEQ tablet Commonly known as: KLOR-CON M Take 10 mEq by mouth 2 (two) times daily.   rosuvastatin 10 MG tablet Commonly known as: CRESTOR Take 10 mg by mouth daily.   tiZANidine 4 MG tablet Commonly known as: ZANAFLEX Take 4 mg by mouth at bedtime as needed for muscle spasms.   triamcinolone cream 0.1 % Commonly known as: KENALOG Apply 1 application. topically 2 (two) times daily.   Vitamin D 50 MCG (2000 UT) Caps Take 2,000 Units by mouth daily.        Follow-up Information     [facility], [facility] Follow up.   Specialty: Home Health Services Why: For home health PT and OT. They will call you to set up a time to come out to your house for therapy Contact information: [address] [place]  [address] [phone]                Discharge Exam: Filed Weights   09/01/21 0254 09/02/21 0546 09/03/21 0100  Weight: 67.2 kg 67.5 kg 66.5 kg   BP (!) 162/86 (BP Location: Right Wrist)   Pulse 60   Temp 98 F (36.7 C) (Oral)   Resp 20   Ht 5' (1.524 m)   Wt 66.5 kg   LMP  (LMP Unknown)   SpO2 96%   BMI 28.61 kg/m   Patient is feeling well, no dyspnea or chest pain  Neurology awake and alert ENT with no pallor Cardiovascular with S1 and S2 present and rhythmic with no gallops, rubs or murmurs Respiratory with no rales or wheezing Abdomen not distended No lower extremity edema   Condition at discharge: stable  The results of significant diagnostics from this hospitalization (including imaging, microbiology, ancillary and laboratory) are listed below for reference.   Imaging Studies: ECHOCARDIOGRAM COMPLETE  Result Date: 09/01/2021    ECHOCARDIOGRAM REPORT   Patient Name:   Avice  Schirmer Date of Exam: 09/01/2021 Medical Rec #:  [id number]    Height:       60.0 in Accession #:    [id number]   Weight:       148.1 lb Date of Birth:  [birth year]    BSA:          1.643 m Patient Age:    59 years     BP:           118/77 mmHg Patient Gender: F            HR:  62 bpm. Exam Location:  Inpatient Procedure: 2D Echo and Intracardiac Opacification Agent Indications:    dyspnea  History:        Patient has prior history of Echocardiogram examinations.                 Hypertrophic Cardiomyopathy; Arrythmias:Atrial Fibrillation and                 LBBB.  Sonographer:    [place] Referring Phys: [id number] TAYE T GONFA IMPRESSIONS  1. Left ventricular ejection fraction, by estimation, is 50 to 55%. The left ventricle has low normal function. The left ventricle has no regional wall motion abnormalities, save fo septal changes realted to conduction delay. There is severe asymmetric left ventricular hypertrophy of the basal-septal segment (15 mm thickness of septal bulge). Left ventricular diastolic parameters are consistent with Grade I diastolic dysfunction (impaired relaxation). No LVOT Obstruction seen in this study.  2. Right ventricular systolic function is hyperdynamic. The right ventricular size is normal. There is normal pulmonary artery systolic pressure. The estimated right ventricular systolic pressure is 97.6 mmHg.  3. The pericardial effusion is posterior to the left ventricle.  4. The mitral valve is normal in structure. Mild mitral valve regurgitation. No evidence of mitral stenosis.  5. The aortic valve is tricuspid. There is mild calcification of the aortic valve. There is mild thickening of the aortic valve. Aortic valve regurgitation is not visualized. Aortic valve sclerosis is present, with no evidence of aortic valve stenosis. Comparison(s): Unclear change in LVEF: LVEF is worse from prior on contrast portion, contrast not done in prior imaging. FINDINGS  Left Ventricle:  Left ventricular ejection fraction, by estimation, is 50 to 55%. The left ventricle has low normal function. The left ventricle has no regional wall motion abnormalities. Definity contrast agent was given IV to delineate the left ventricular endocardial borders. The left ventricular internal cavity size was normal in size. There is severe asymmetric left ventricular hypertrophy of the basal-septal segment. Abnormal (paradoxical) septal motion, consistent with left bundle branch block. Left ventricular diastolic parameters are consistent with Grade I diastolic dysfunction (impaired relaxation). Right Ventricle: The right ventricular size is normal. No increase in right ventricular wall thickness. Right ventricular systolic function is hyperdynamic. There is normal pulmonary artery systolic pressure. The tricuspid regurgitant velocity is 2.84 m/s, and with an assumed right atrial pressure of 3 mmHg, the estimated right ventricular systolic pressure is 73.4 mmHg. Left Atrium: Left atrial size was normal in size. Right Atrium: Right atrial size was normal in size. Pericardium: Trivial pericardial effusion is present. The pericardial effusion is posterior to the left ventricle. Presence of epicardial fat layer. Mitral Valve: The mitral valve is normal in structure. Mild mitral valve regurgitation. No evidence of mitral valve stenosis. Tricuspid Valve: The tricuspid valve is normal in structure. Tricuspid valve regurgitation is mild . No evidence of tricuspid stenosis. Aortic Valve: The aortic valve is tricuspid. There is mild calcification of the aortic valve. There is mild thickening of the aortic valve. There is mild aortic valve annular calcification. Aortic valve regurgitation is not visualized. Aortic valve sclerosis is present, with no evidence of aortic valve stenosis. Pulmonic Valve: The pulmonic valve was not well visualized. Pulmonic valve regurgitation is trivial. No evidence of pulmonic stenosis. Aorta: The  aortic root is normal in size and structure. Pulmonary Artery: The pulmonary artery is of normal size. IAS/Shunts: No atrial level shunt detected by color flow Doppler.  LEFT VENTRICLE PLAX 2D  LVIDd:         4.50 cm     Diastology LVIDs:         3.40 cm     LV e' medial:    9.30 cm/s LV PW:         1.10 cm     LV E/e' medial:  9.4 LV IVS:        1.10 cm     LV e' lateral:   4.58 cm/s LVOT diam:     1.40 cm     LV E/e' lateral: 19.1 LV SV:         50 LV SV Index:   30 LVOT Area:     1.54 cm  LV Volumes (MOD) LV vol d, MOD A4C: 55.2 ml LV vol s, MOD A4C: 34.3 ml LV SV MOD A4C:     55.2 ml RIGHT VENTRICLE             IVC RV S prime:     12.80 cm/s  IVC diam: 1.60 cm LEFT ATRIUM             Index LA diam:        4.00 cm 2.43 cm/m LA Vol (A2C):   52.6 ml 32.02 ml/m LA Vol (A4C):   32.9 ml 20.03 ml/m LA Biplane Vol: 42.2 ml 25.69 ml/m  AORTIC VALVE LVOT Vmax:   155.00 cm/s LVOT Vmean:  101.000 cm/s LVOT VTI:    0.322 m  AORTA Ao Root diam: 2.70 cm Ao Asc diam:  2.60 cm MITRAL VALVE                TRICUSPID VALVE MV Area (PHT): 2.91 cm     TR Peak grad:   32.3 mmHg MV Decel Time: 261 msec     TR Vmax:        284.00 cm/s MV E velocity: 87.50 cm/s MV A velocity: 104.00 cm/s  SHUNTS MV E/A ratio:  0.84         Systemic VTI:  0.32 m                             Systemic Diam: 1.40 cm Rudean Haskell MD Electronically signed by Rudean Haskell MD Signature Date/Time: 09/01/2021/1:35:55 PM    Final    DG Chest Port 1 View  Result Date: 08/31/2021 CLINICAL DATA:  Dyspnea. EXAM: PORTABLE CHEST 1 VIEW COMPARISON:  Radiographs 08/29/2021 and 02/12/2021.  CT 09/17/2020. FINDINGS: 1508 hours. Stable cardiomegaly and aortic atherosclerosis. Stable mild chronic interstitial prominence without evidence of superimposed edema, confluent airspace opacity, pneumothorax or significant pleural effusion. The bones appear unchanged. No acute osseous findings are evident. 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 No acute process identified. Electronically Signed   By: Richardean Sale M.D.   On: 08/31/2021 15:23   DG Chest Portable 1 View  Result Date: 08/29/2021 CLINICAL DATA:  General weakness EXAM: PORTABLE CHEST 1 VIEW COMPARISON:  Radiograph 02/12/2021, chest CT 09/17/2020 FINDINGS: Unchanged enlarged cardiac silhouette. There is mild interstitial prominence bilaterally, similar to prior exams. There is no focal airspace consolidation. There is no pleural effusion. No pneumothorax. No acute osseous abnormality. IMPRESSION: Cardiomegaly with similar chronic interstitial changes bilaterally. No new airspace disease. Electronically Signed   By: Maurine Simmering M.D.   On: 08/29/2021 14:57    Microbiology: Results for orders placed or performed during the hospital encounter of 08/29/21  SARS Coronavirus 2  by RT PCR (hospital order, performed in [facility] hospital lab) *cepheid single result test* Anterior Nasal Swab     Status: None   Collection Time: 08/29/21  2:37 PM   Specimen: Anterior Nasal Swab  Result Value Ref Range Status   SARS Coronavirus 2 by RT PCR NEGATIVE NEGATIVE Final    Comment: (NOTE) SARS-CoV-2 target nucleic acids are NOT DETECTED.  The SARS-CoV-2 RNA is generally detectable in upper and lower respiratory specimens during the acute phase of infection. The lowest concentration of SARS-CoV-2 viral copies this assay can detect is 250 copies / mL. A negative result does not preclude SARS-CoV-2 infection and should not be used as the sole basis for treatment or other patient management decisions.  A negative result may occur with improper specimen collection / handling, submission of specimen other than nasopharyngeal swab, presence of viral mutation(s) within the areas targeted by this assay, and inadequate number of viral copies (<250 copies / mL). A negative result must be combined with clinical observations, patient history, and epidemiological information.  Fact  Sheet for Patients:   https://www.patel.info/  Fact Sheet for Healthcare Providers: https://hall.com/  This test is not yet approved or  cleared by the [place] FDA and has been authorized for detection and/or diagnosis of SARS-CoV-2 by FDA under an Emergency Use Authorization (EUA).  This EUA will remain in effect (meaning this test can be used) for the duration of the COVID-19 declaration under Section 564(b)(1) of the Act, 21 U.S.C. section 360bbb-3(b)(1), unless the authorization is terminated or revoked sooner.  Performed at [facility], [place] [address], [place], [place] [address]     Labs: CBC: Recent Labs  Lab 08/29/21 1453 08/30/21 0421 08/31/21 0343 09/01/21 0229 09/02/21 0324  WBC 9.4 6.1 5.7 10.5 7.9  NEUTROABS 8.8*  --   --   --   --   HGB 13.8 10.9* 11.8* 12.5 11.1*  HCT 39.6 32.6* 35.2* 36.1 32.5*  MCV 94.7 97.0 96.7 94.0 95.9  PLT 198 151 145* 196 696   Basic Metabolic Panel: Recent Labs  Lab 08/30/21 0421 08/31/21 0343 09/01/21 0229 09/02/21 0324 09/03/21 0357  NA 134* 138 135 135 137  K 3.6 4.0 3.0* 4.2 4.1  CL 104 110 100 107 103  CO2 21* 20* 24 21* 23  GLUCOSE 93 86 121* 88 96  BUN '19 12 11 9 11  '$ CREATININE 1.30* 0.95 1.00 1.08* 1.17*  CALCIUM 8.5* 8.2* 8.6* 8.7* 10.2  MG 1.6* 2.2 1.8 1.8  --   PHOS  --  2.0*  --  2.0*  --    Liver Function Tests: Recent Labs  Lab 08/29/21 1453 08/30/21 0421 08/31/21 0343 09/01/21 0229 09/02/21 0324  AST 50* 58* 57* 44*  --   ALT 39 38 37 38  --   ALKPHOS 51 42 46 55  --   BILITOT 1.4* 1.0 0.8 0.8  --   PROT 6.6 5.2* 5.5* 6.2*  --   ALBUMIN 3.7 3.0* 2.9* 3.2* 2.8*   CBG: No results for input(s): "GLUCAP" in the last 168 hours.  Discharge time spent: greater than 30 minutes.  Signed: Tawni Millers, MD [facility] 09/03/2021

## 2021-09-03 NOTE — Care Management Important Message (Signed)
Important Message  Patient Details  Name: Laurie Bowman MRN: [id number] Date of Birth: [birth year]   Medicare Important Message Given:  Other (see comment)     [name] 09/03/2021, 2:07 PM

## 2021-09-03 NOTE — Progress Notes (Signed)
Physical Therapy Treatment Patient Details Name: Laurie Bowman MRN: [id number] DOB: 1941/01/07 Today's Date: 09/03/2021   History of Present Illness Pt is a 81 y.o. female adm 6/14 after fall at home. Pt had recevied osteoposis infusion earlier and then developed body aches and weakness. Weakness thought likely due to Reclast infusion.  PMHx: of osteoporosis, asthma, GERD, hypertension, CKD 2, alopecia, hyperlipidemia, IBS, depression, breast cancer.    PT Comments    Pt was seen for final visit prior to dc home today, and was initially not interested in therapy.  However, did want to return to bed so encouraged her to walk on the new RW for home.  Pt can maneuver the walker without help, and is now expecting to leave this afternoon.  Have discussed all questions and pt is comfortable with her follow up plan.   HHPT to follow up as expected to continue strengthening and balance training to reduce her fall risk.   Recommendations for follow up therapy are one component of a multi-disciplinary discharge planning process, led by the attending physician.  Recommendations may be updated based on patient status, additional functional criteria and insurance authorization.  Follow Up Recommendations  Home health PT     Assistance Recommended at Discharge Intermittent Supervision/Assistance  Patient can return home with the following A little help with walking and/or transfers;A little help with bathing/dressing/bathroom;Assistance with cooking/housework;Assist for transportation   Equipment Recommendations  None recommended by PT    Recommendations for Other Services       Precautions / Restrictions Precautions Precautions: Fall Restrictions Weight Bearing Restrictions: No     Mobility  Bed Mobility Overal bed mobility: Needs Assistance Bed Mobility: Sit to Supine       Sit to supine: Supervision   General bed mobility comments: requires help with all lines    Transfers Overall  transfer level: Needs assistance Equipment used: 1 person hand held assist Transfers: Sit to/from Stand Sit to Stand: Min guard                Ambulation/Gait Ambulation/Gait assistance: Min guard Gait Distance (Feet): 45 Feet Assistive device: Rolling walker (2 wheels) Gait Pattern/deviations: Step-through pattern, Decreased stride length, Narrow base of support Gait velocity: reduced Gait velocity interpretation: <1.31 ft/sec, indicative of household ambulator Pre-gait activities: standing balance and lines General Gait Details: min guard for safety and lines   Stairs             Wheelchair Mobility    Modified Rankin (Stroke Patients Only)       Balance Overall balance assessment: Needs assistance Sitting-balance support: Feet supported Sitting balance-Leahy Scale: Good       Standing balance-Leahy Scale: Fair Standing balance comment: BUE support on walker with no assist to maintain balance otherwse                            Cognition Arousal/Alertness: Awake/alert Behavior During Therapy: WFL for tasks assessed/performed Overall Cognitive Status: Within Functional Limits for tasks assessed                                          Exercises      General Comments General comments (skin integrity, edema, etc.): pt was required to demonstrate that she could maneuver without help and that her balance was supported with walker.  Off telemetry briefly to  perform and no issues with pulse or sats afterward noted      Pertinent Vitals/Pain Pain Assessment Pain Assessment: No/denies pain    Home Living                          Prior Function            PT Goals (current goals can now be found in the care plan section) Acute Rehab PT Goals Patient Stated Goal: return home Progress towards PT goals: Progressing toward goals    Frequency    Min 3X/week      PT Plan Current plan remains appropriate     Co-evaluation              AM-PAC PT "6 Clicks" Mobility   Outcome Measure  Help needed turning from your back to your side while in a flat bed without using bedrails?: A Little Help needed moving from lying on your back to sitting on the side of a flat bed without using bedrails?: A Little Help needed moving to and from a bed to a chair (including a wheelchair)?: A Little Help needed standing up from a chair using your arms (e.g., wheelchair or bedside chair)?: A Little Help needed to walk in hospital room?: A Little Help needed climbing 3-5 steps with a railing? : A Little 6 Click Score: 18    End of Session Equipment Utilized During Treatment: Gait belt Activity Tolerance: Patient limited by fatigue Patient left: in bed;with call bell/phone within reach;with bed alarm set Nurse Communication: Mobility status PT Visit Diagnosis: Unsteadiness on feet (R26.81);Muscle weakness (generalized) (M62.81)     Time: 3532-9924 PT Time Calculation (min) (ACUTE ONLY): 25 min  Charges:  $Gait Training: 8-22 mins $Therapeutic Activity: 8-22 mins   Ramond Dial 09/03/2021, 4:23 PM  Mee Hives, PT PhD Acute Rehab Dept. Number: [place] and [place]

## 2021-09-03 NOTE — TOC Transition Note (Addendum)
Transition of Care [facility]) - CM/SW Discharge Note   Patient Details  Name: Laurie Bowman MRN: [id number] Date of Birth: Sep 11, 1940  Transition of Care [facility]) CM/SW Contact:  Zenon Mayo, RN Phone Number: 09/03/2021, 11:22 AM   Clinical Narrative:    Patient is for dc today, she states her son will transport her home, he is 2 hrs away.  She states she will also need a youth rolling walker, she is ok with [employer] supplying this for her.  NCM made referral to [facility] with [facility].  This will be  brought to patient's room prior to dc. NCM notified Tommi Rumps with [name] of the dc today.    Final next level of care: [name] to Discharge: No Barriers Identified   Patient Goals and CMS Choice Patient states their goals for this hospitalization and ongoing recovery are:: return home CMS Medicare.gov Compare Post Acute Care list provided to:: Patient Choice offered to / list presented to : Patient  Discharge Placement                       Discharge Plan and Services   Discharge Planning Services: CM Consult Post Acute Care Choice: Home Health          DME Arranged: Walker rolling DME Agency: [employer] Date DME Agency Contacted: 09/03/21 Time DME Agency Contacted: 1122 Representative spoke with at DME Agency: Jodell Cipro HH Arranged: PT, OT [place] Agency: [place] Date [place]: 09/01/21 Time [place]: [place] Representative spoke with at [place]: Tommi Rumps  Social Determinants of Health ([place]) Interventions     Readmission Risk Interventions     No data to display

## 2021-09-03 NOTE — Progress Notes (Signed)
Progress Note  Patient Name: Laurie Bowman Date of Encounter: 09/03/2021  [facility] Cardiologist: Shirlee More, MD   Subjective   Talkative.  Feeling better.  She is very concerned however about her mobility.  Ray, her husband is a patient of Dr. Tanna Furry.  Pacemaker.  Inpatient Medications    Scheduled Meds:  amiodarone  200 mg Oral BID   apixaban  5 mg Oral BID   budesonide (PULMICORT) nebulizer solution  0.25 mg Nebulization BID   buPROPion  150 mg Oral Daily   calcium-vitamin D  1 tablet Oral Q breakfast   hydrochlorothiazide  25 mg Oral Daily   losartan  25 mg Oral Daily   pantoprazole  40 mg Oral Daily   sodium chloride flush  3 mL Intravenous Q12H   Continuous Infusions:  sodium chloride 10 mL/hr at 08/31/21 1843   ferric gluconate (FERRLECIT) IVPB     PRN Meds: acetaminophen **OR** acetaminophen, dicyclomine, levalbuterol, nitroGLYCERIN, polyethylene glycol, traMADol   Vital Signs    Vitals:   09/03/21 0100 09/03/21 0657 09/03/21 0748 09/03/21 0913  BP:  (!) 158/76  (!) 162/86  Pulse:  60  60  Resp:  16  20  Temp:  98.5 F (36.9 C)  98 F (36.7 C)  TempSrc:  Oral  Oral  SpO2:  98% 96%   Weight: 66.5 kg     Height:        Intake/Output Summary (Last 24 hours) at 09/03/2021 0932 Last data filed at 09/03/2021 0917 Gross per 24 hour  Intake 1299.44 ml  Output 2950 ml  Net -1650.56 ml      09/03/2021    1:00 AM 09/02/2021    5:46 AM 09/01/2021    2:54 AM  Last 3 Weights  Weight (lbs) 146 lb 8 oz 148 lb 12.8 oz 148 lb 1.6 oz  Weight (kg) 66.452 kg 67.495 kg 67.178 kg      Telemetry    Sinus rhythm, no further atrial fibrillation- Personally Reviewed  ECG    No new- Personally Reviewed  Physical Exam   GEN: No acute distress.   Neck: No JVD Cardiac: RRR, no murmurs, rubs, or gallops.  Respiratory: Clear to auscultation bilaterally. GI: Soft, nontender, non-distended  MS: No edema; No deformity. Neuro:  Nonfocal  Psych: Normal affect    Labs    High Sensitivity Troponin:   Recent Labs  Lab 08/29/21 1508 08/29/21 1643 08/30/21 0421 08/31/21 1509 08/31/21 1638  TROPONINIHS 33* 36* 43* 32* 36*     Chemistry Recent Labs  Lab 08/30/21 0421 08/31/21 0343 09/01/21 0229 09/02/21 0324 09/03/21 0357  NA 134* 138 135 135 137  K 3.6 4.0 3.0* 4.2 4.1  CL 104 110 100 107 103  CO2 21* 20* 24 21* 23  GLUCOSE 93 86 121* 88 96  BUN '19 12 11 9 11  '$ CREATININE 1.30* 0.95 1.00 1.08* 1.17*  CALCIUM 8.5* 8.2* 8.6* 8.7* 10.2  MG 1.6* 2.2 1.8 1.8  --   PROT 5.2* 5.5* 6.2*  --   --   ALBUMIN 3.0* 2.9* 3.2* 2.8*  --   AST 58* 57* 44*  --   --   ALT 38 37 38  --   --   ALKPHOS 42 46 55  --   --   BILITOT 1.0 0.8 0.8  --   --   GFRNONAA 42* >60 57* 52* 47*  ANIONGAP '9 8 11 7 11    '$ Lipids No results for input(s): "  CHOL", "TRIG", "HDL", "LABVLDL", "LDLCALC", "CHOLHDL" in the last 168 hours.  Hematology Recent Labs  Lab 08/31/21 0343 08/31/21 0345 09/01/21 0229 09/02/21 0324  WBC 5.7  --  10.5 7.9  RBC 3.64* 3.55* 3.84* 3.39*  HGB 11.8*  --  12.5 11.1*  HCT 35.2*  --  36.1 32.5*  MCV 96.7  --  94.0 95.9  MCH 32.4  --  32.6 32.7  MCHC 33.5  --  34.6 34.2  RDW 12.8  --  12.8 12.7  PLT 145*  --  196 186   Thyroid  Recent Labs  Lab 08/31/21 1638  TSH 3.744    BNP Recent Labs  Lab 08/31/21 1509  BNP 655.8*    DDimer No results for input(s): "DDIMER" in the last 168 hours.   Radiology    ECHOCARDIOGRAM COMPLETE  Result Date: 09/01/2021    ECHOCARDIOGRAM REPORT   Patient Name:   Ethell Murnane Date of Exam: 09/01/2021 Medical Rec #:  [id number]    Height:       60.0 in Accession #:    [id number]   Weight:       148.1 lb Date of Birth:  Oct 04, 1940    BSA:          1.643 m Patient Age:    81 years     BP:           118/77 mmHg Patient Gender: F            HR:           62 bpm. Exam Location:  Inpatient Procedure: 2D Echo and Intracardiac Opacification Agent Indications:    dyspnea  History:        Patient has prior  history of Echocardiogram examinations.                 Hypertrophic Cardiomyopathy; Arrythmias:Atrial Fibrillation and                 LBBB.  Sonographer:    [place] Referring Phys: [id number] TAYE T GONFA IMPRESSIONS  1. Left ventricular ejection fraction, by estimation, is 50 to 55%. The left ventricle has low normal function. The left ventricle has no regional wall motion abnormalities, save fo septal changes realted to conduction delay. There is severe asymmetric left ventricular hypertrophy of the basal-septal segment (15 mm thickness of septal bulge). Left ventricular diastolic parameters are consistent with Grade I diastolic dysfunction (impaired relaxation). No LVOT Obstruction seen in this study.  2. Right ventricular systolic function is hyperdynamic. The right ventricular size is normal. There is normal pulmonary artery systolic pressure. The estimated right ventricular systolic pressure is 23.5 mmHg.  3. The pericardial effusion is posterior to the left ventricle.  4. The mitral valve is normal in structure. Mild mitral valve regurgitation. No evidence of mitral stenosis.  5. The aortic valve is tricuspid. There is mild calcification of the aortic valve. There is mild thickening of the aortic valve. Aortic valve regurgitation is not visualized. Aortic valve sclerosis is present, with no evidence of aortic valve stenosis. Comparison(s): Unclear change in LVEF: LVEF is worse from prior on contrast portion, contrast not done in prior imaging. FINDINGS  Left Ventricle: Left ventricular ejection fraction, by estimation, is 50 to 55%. The left ventricle has low normal function. The left ventricle has no regional wall motion abnormalities. Definity contrast agent was given IV to delineate the left ventricular endocardial borders. The left ventricular internal cavity size was normal in size.  There is severe asymmetric left ventricular hypertrophy of the basal-septal segment. Abnormal (paradoxical)  septal motion, consistent with left bundle branch block. Left ventricular diastolic parameters are consistent with Grade I diastolic dysfunction (impaired relaxation). Right Ventricle: The right ventricular size is normal. No increase in right ventricular wall thickness. Right ventricular systolic function is hyperdynamic. There is normal pulmonary artery systolic pressure. The tricuspid regurgitant velocity is 2.84 m/s, and with an assumed right atrial pressure of 3 mmHg, the estimated right ventricular systolic pressure is 99.3 mmHg. Left Atrium: Left atrial size was normal in size. Right Atrium: Right atrial size was normal in size. Pericardium: Trivial pericardial effusion is present. The pericardial effusion is posterior to the left ventricle. Presence of epicardial fat layer. Mitral Valve: The mitral valve is normal in structure. Mild mitral valve regurgitation. No evidence of mitral valve stenosis. Tricuspid Valve: The tricuspid valve is normal in structure. Tricuspid valve regurgitation is mild . No evidence of tricuspid stenosis. Aortic Valve: The aortic valve is tricuspid. There is mild calcification of the aortic valve. There is mild thickening of the aortic valve. There is mild aortic valve annular calcification. Aortic valve regurgitation is not visualized. Aortic valve sclerosis is present, with no evidence of aortic valve stenosis. Pulmonic Valve: The pulmonic valve was not well visualized. Pulmonic valve regurgitation is trivial. No evidence of pulmonic stenosis. Aorta: The aortic root is normal in size and structure. Pulmonary Artery: The pulmonary artery is of normal size. IAS/Shunts: No atrial level shunt detected by color flow Doppler.  LEFT VENTRICLE PLAX 2D LVIDd:         4.50 cm     Diastology LVIDs:         3.40 cm     LV e' medial:    9.30 cm/s LV PW:         1.10 cm     LV E/e' medial:  9.4 LV IVS:        1.10 cm     LV e' lateral:   4.58 cm/s LVOT diam:     1.40 cm     LV E/e' lateral:  19.1 LV SV:         50 LV SV Index:   30 LVOT Area:     1.54 cm  LV Volumes (MOD) LV vol d, MOD A4C: 55.2 ml LV vol s, MOD A4C: 34.3 ml LV SV MOD A4C:     55.2 ml RIGHT VENTRICLE             IVC RV S prime:     12.80 cm/s  IVC diam: 1.60 cm LEFT ATRIUM             Index LA diam:        4.00 cm 2.43 cm/m LA Vol (A2C):   52.6 ml 32.02 ml/m LA Vol (A4C):   32.9 ml 20.03 ml/m LA Biplane Vol: 42.2 ml 25.69 ml/m  AORTIC VALVE LVOT Vmax:   155.00 cm/s LVOT Vmean:  101.000 cm/s LVOT VTI:    0.322 m  AORTA Ao Root diam: 2.70 cm Ao Asc diam:  2.60 cm MITRAL VALVE                TRICUSPID VALVE MV Area (PHT): 2.91 cm     TR Peak grad:   32.3 mmHg MV Decel Time: 261 msec     TR Vmax:        284.00 cm/s MV E velocity: 87.50 cm/s MV A velocity: 104.00 cm/s  SHUNTS MV E/A ratio:  0.84         Systemic VTI:  0.32 m                             Systemic Diam: 1.40 cm Rudean Haskell MD Electronically signed by Rudean Haskell MD Signature Date/Time: 09/01/2021/1:35:55 PM    Final     Cardiac Studies   Echocardiogram shows EF 50 to 55% with severe asymmetric left ventricular hypertrophy basal septum.  Mild MR.  Mild TR.  Unchanged.  Patient Profile     81 y.o. female with hypertrophic cardiomyopathy, basal septal hypertrophy here with rhabdomyolysis  Assessment & Plan    Hypertrophic cardiomyopathy - No outflow tract obstruction on echocardiogram.  Low normal EF. - Symptoms were exacerbated by atrial fibrillation.  She is currently on oral amiodarone 200 twice daily for 7 days then 200 mg daily. -IV diltiazem used transiently.  We are staying off of the verapamil given use of amiodarone. -Had new onset atrial fibrillation.  Has underlying left bundle branch block.  She was not well rate controlled with diltiazem/verapamil only.  Essential hypertension - Agree with losartan 25. -Agree with HCTZ 25 -She states that in the past he has had excellent blood pressure 120/80.  At 1 point she was taking  low-dose HCTZ 12.5 every other day. -  magnesium repleted.  Elevated troponin - Low-level, flat myocardial injury pattern in the setting of hypertrophic cardiomyopathy.  Paroxysmal atrial fibrillation - Currently on amiodarone p.o.  See above.  Given her hypertrophic cardiomyopathy, would not tolerate atrial fibrillation well.  Agree with antiarrhythmic.  New onset.  Chronic anticoagulation - Eliquis.  Watch for any signs of bleeding.  No changes made.  Mobility is her main concern.  Continue to work with physical therapy/mobility specialist.  No new recommendations at this time.  We will go ahead and set up outpatient follow-up.  We will sign off.    For questions or updates, please contact [place] Please consult [email or web address] for contact info under        Signed, Candee Furbish, MD  09/03/2021, 9:32 AM

## 2021-09-03 NOTE — Progress Notes (Signed)
Pt discharge education and instructions completed with pt and son at bedside. Both voices understanding and denies any questions. Pt IV's and telemetry removed prior to discharge. Pt DME rolling walker and TOC prescriptions delivered to pt at bedside. Pt discharged home with son to transport her home. Pt transported off unit via wheelchair with belongings including the walker and son at side. Delia Heady RN

## 2021-09-03 NOTE — Progress Notes (Signed)
Mobility Specialist: Progress Note   09/03/21 1203  Mobility  Activity Ambulated with assistance in hallway  Level of Assistance Minimal assist, patient does 75% or more  Assistive Device Front wheel walker  Distance Ambulated (ft) 200 ft  Activity Response Tolerated well  $Mobility charge 1 Mobility   Pt received in the bed and agreeable to mobility. Mild posterior lean upon standing requiring minA for balance, asymptomatic during session. Pt to the chair after session with call bell at her side.   [facility] Laurie Bowman Mobility Specialist Mobility Specialist [address]: [phone]

## 2021-09-03 NOTE — Assessment & Plan Note (Signed)
Continue with buspirone

## 2021-09-03 NOTE — Telephone Encounter (Signed)
Left message with husband for Pt to call us back/kbl 09/03/2021

## 2021-09-03 NOTE — Telephone Encounter (Signed)
-----   Message from Margie Billet, PA-C sent at 09/03/2021  2:27 PM EDT ----- Regarding: Hospital Follow Up Please arrange a hospital follow up appointment with Dr. Bettina Gavia in the next month.   Thanks! KJ

## 2021-09-03 NOTE — Progress Notes (Signed)
6/19 Spoke to patient via telephone and patient has rcv'd the initial letter. The letter explained and acknowledged.

## 2021-09-20 ENCOUNTER — Other Ambulatory Visit: Payer: Self-pay | Admitting: Cardiology

## 2021-09-21 ENCOUNTER — Telehealth (HOSPITAL_COMMUNITY): Payer: Self-pay

## 2021-09-21 ENCOUNTER — Other Ambulatory Visit (HOSPITAL_COMMUNITY): Payer: Self-pay

## 2021-09-21 NOTE — Telephone Encounter (Signed)
Pharmacy Transitions of Care Follow-up Telephone Call  Date of discharge: 09/03/21  Discharge Diagnosis: afib  How have you been since you were released from the hospital? Patient stated she has been slowly getting stronger with physical therapy. She is still unable to make it up the stairs in her home to sleep in her bed. She has not been getting out lately; just to the mailbox and back. Her most recent complaint was "continually burping." She was not aware that she was not provided with medication refills upon discharge and was unable to obtain a follow up appointment prior to early August. I recommended reaching out to the cardiologist and family medicine physicians to get see if either would prescribe refills prior to her appointment.   Medication changes made at discharge:   amiodarone 200 MG tablet Commonly known as: PACERONE Take twice daily until 09/07/21 and starting on 09/08/21 start taking once daily.  Eliquis 5 MG Tabs tablet Generic drug: apixaban Take 1 tablet (5 mg total) by mouth 2 (two) times daily.  losartan 25 MG tablet Commonly known as: COZAAR Take 1 tablet (25 mg total) by mouth daily.   CHANGE how you take these medications  CHANGE how you take these medications  fluticasone 110 MCG/ACT inhaler Commonly known as: FLOVENT HFA Inhale 2 puffs into the lungs 2 (two) times daily. Use with spacer. What changed:  when to take this reasons to take this additional instructions  hydrochlorothiazide 25 MG tablet Commonly known as: HYDRODIURIL Take 1 tablet (25 mg total) by mouth daily. What changed:  medication strength how much to take  montelukast 10 MG tablet Commonly known as: SINGULAIR TAKE 1 TABLET BY MOUTH EVERYDAY AT BEDTIME What changed:  how much to take how to take this when to take this additional instructions   CONTINUE taking these medications  CONTINUE taking these medications  * albuterol 108 (90 Base) MCG/ACT inhaler Commonly known as:  ProAir HFA Use 1-2 puffs every 4-6 hours as needed for cough or wheeze.  * albuterol (2.5 MG/3ML) 0.083% nebulizer solution Commonly known as: PROVENTIL Take 3 mLs (2.5 mg total) by nebulization every 6 (six) hours as needed for wheezing or shortness of breath.  augmented betamethasone dipropionate 0.05 % cream Commonly known as: DIPROLENE-AF  benzonatate 100 MG capsule Commonly known as: TESSALON  Biotin 1000 MCG tablet  buPROPion 150 MG 12 hr tablet Commonly known as: WELLBUTRIN SR  calcium-vitamin D 500-200 MG-UNIT Tabs tablet Commonly known as: OSCAL WITH D  clobetasol cream 0.05 % Commonly known as: TEMOVATE  clotrimazole-betamethasone cream Commonly known as: LOTRISONE  diclofenac Sodium 1 % Gel Commonly known as: VOLTAREN  dicyclomine 20 MG tablet Commonly known as: BENTYL  fexofenadine 180 MG tablet Commonly known as: ALLEGRA  fluocinonide cream 0.05 % Commonly known as: LIDEX  lansoprazole 30 MG capsule Commonly known as: PREVACID Take 1 capsule (30 mg total) by mouth 2 (two) times daily before a meal.  olopatadine 0.1 % ophthalmic solution Commonly known as: Patanol Place 1 drop into both eyes 2 (two) times daily as needed.  PEG 3350 17 GM/SCOOP Powd  potassium chloride 10 MEQ tablet Commonly known as: KLOR-CON M  rosuvastatin 10 MG tablet Commonly known as: CRESTOR  tiZANidine 4 MG tablet Commonly known as: ZANAFLEX  triamcinolone cream 0.1 % Commonly known as: KENALOG  Vitamin D 69 MCG (2000 UT) Caps   very important  * This list has 2 medication(s) that are the same as other medications prescribed for you. Read  the directions carefully, and ask your doctor or other care provider to review them with you.    STOP taking these medications  STOP taking these medications  alendronate 70 MG tablet Commonly known as: FOSAMAX  aspirin 81 MG chewable tablet  calcium carbonate 600 MG Tabs tablet Commonly known as: OS-CAL  traMADol 50 MG tablet Commonly known  as: ULTRAM  verapamil 180 MG CR tablet Commonly known as: CALAN-SR    Medication changes verified by the patient? yes    Medication Accessibility:  [facility]   Was the patient provided with refills on discharged medications? no    Medication Review: APIXABAN (ELIQUIS)  Apixaban 5 mg BID initiated on 09/03/21.  - Discussed importance of taking medication around the same time everyday  - Reviewed potential DDIs with patient  - Advised patient of medications to avoid (NSAIDs, ASA)  - Educated that Tylenol (acetaminophen) will be the preferred analgesic to prevent risk of bleeding  - Emphasized importance of monitoring for signs and symptoms of bleeding (abnormal bruising, prolonged bleeding, nose bleeds, bleeding from gums, discolored urine, black tarry stools)  - Advised patient to alert all providers of anticoagulation therapy prior to starting a new medication or having a procedure   Follow-up Appointments:        Follow up with [facility] [facility] [place] For home health PT and OT. They will call you to set up a time to come out to your house for therapy [place] [address] [phone]         Follow up with Derrill Center, MD Specialty: Family Medicine lease follow up in a week. [address]  [place] [address] [phone]         Follow up with Shirlee More, MD Specialty: Cardiology, Radiology [facility] [place]  [address]  [place] [address] [phone]         Follow up with [facility](y[facility]) [address]  [place] [address] [phone]  Aug11 Office Visit with Shirlee More, MD Friday Oct 26, 2021 3:00 PM (Arrive by 2:45 PM) Please arrive 15 minutes prior to your appointment. This will allow [place] to verify and update your medical record and ensure a full appointment for you within the time allotted. [facility]  [facility] [address] [place]  [place] [address] [phone]  [place] Visit with Valentina Shaggy, MD Tuesday Jan 08, 2022 11:15 AM (Arrive by 11:00 AM) Please arrive 15 minutes prior to your appointment. This will allow [place] to verify and update your medical record and ensure a full appointment for you within the time allotted. [facility] [place]    Final Patient Assessment: Patient stated she has been slowly getting stronger with physical therapy. She is still unable to make it up the stairs in her home to sleep in her bed. She has not been getting out lately; just to the mailbox and back. Her most recent complaint was "continually burping." She was not aware that she was not provided with medication refills upon discharge and was unable to obtain a follow up appointment prior to early August. I recommended reaching out to the cardiologist and family medicine physicians to get see if either would prescribe refills prior to her appointment.

## 2021-10-16 ENCOUNTER — Other Ambulatory Visit: Payer: Self-pay

## 2021-10-16 ENCOUNTER — Telehealth: Payer: Self-pay | Admitting: Cardiology

## 2021-10-16 MED ORDER — AMIODARONE HCL 100 MG PO TABS: 100.0000 mg | ORAL_TABLET | Freq: Every day | ORAL | 3 refills | Status: DC

## 2021-10-16 NOTE — Telephone Encounter (Signed)
Pt c/o medication issue:  1. Name of Medication: Amiodarone 200 mg  2. How are you currently taking this medication (dosage and times per day)?   3. Are you having a reaction (difficulty breathing--STAT)?   4. What is your medication issue?  Patient said they put her on this medicine while she was in the hospital- patient wants to stop it- she feels like it is too large of dose. She said she she is going to stop it, until her appointment on 10-26-21. Is this alright for her to stop it?ake

## 2021-10-16 NOTE — Telephone Encounter (Signed)
Called patient and she reported that she was started on this medication in the hospital and the patient feels like it is too large of a dose for her. Spoke with Dr. Bettina Gavia regarding her concerns and he recommended decreasing Amiodarone to 100 mg daily. An order was placed in Epic and the medication was sent to the patient's pharmacy. Patient had no further questions at this time.

## 2021-10-26 ENCOUNTER — Ambulatory Visit: Payer: Medicare PPO | Admitting: Cardiology

## 2021-10-26 VITALS — BP 140/82 | Ht 60.0 in | Wt 137.8 lb

## 2021-10-26 DIAGNOSIS — Z7901 Long term (current) use of anticoagulants: Secondary | ICD-10-CM

## 2021-10-26 DIAGNOSIS — Z79899 Other long term (current) drug therapy: Secondary | ICD-10-CM | POA: Diagnosis not present

## 2021-10-26 DIAGNOSIS — I447 Left bundle-branch block, unspecified: Secondary | ICD-10-CM

## 2021-10-26 DIAGNOSIS — I480 Paroxysmal atrial fibrillation: Secondary | ICD-10-CM | POA: Diagnosis not present

## 2021-10-26 DIAGNOSIS — I119 Hypertensive heart disease without heart failure: Secondary | ICD-10-CM | POA: Diagnosis not present

## 2021-10-26 NOTE — Patient Instructions (Signed)
Medication Instructions:  Your physician has recommended you make the following change in your medication:   STOP: Amiodarone  *If you need a refill on your cardiac medications before your next appointment, please call your pharmacy*   Lab Work: None If you have labs (blood work) drawn today and your tests are completely normal, you will receive your results only by: [place] (if you have MyChart) OR A paper copy in the mail If you have any lab test that is abnormal or we need to change your treatment, we will call you to review the results.   Testing/Procedures: None   Follow-Up: At [facility], you and your health needs are our priority.  As part of our continuing mission to provide you with exceptional heart care, we have created designated Provider Care Teams.  These Care Teams include your primary Cardiologist (physician) and Advanced Practice Providers (APPs -  Physician Assistants and Nurse Practitioners) who all work together to provide you with the care you need, when you need it.  We recommend signing up for the patient portal called "MyChart".  Sign up information is provided on this After Visit Summary.  MyChart is used to connect with patients for Virtual Visits (Telemedicine).  Patients are able to view lab/test results, encounter notes, upcoming appointments, etc.  Non-urgent messages can be sent to your provider as well.   To learn more about what you can do with MyChart, go to [email or web address].    Your next appointment:   Follow up with Laurie Bowman  The format for your next appointment:   In Person  Provider:   Cristopher Peru, MD {   Other Instructions Use Mobile [place] and record daily  Check and record BP and HR at home daily  Important Information About [place]

## 2021-10-26 NOTE — Progress Notes (Signed)
Cardiology Office Note:    Date:  10/26/2021   ID:  Oran Rein, DOB 1940-08-08, MRN [id number]  PCP:  Derrill Center., MD  Cardiologist:  Shirlee More, MD    Referring MD: Derrill Center., MD    ASSESSMENT:    1. Paroxysmal atrial fibrillation (HCC)   2. On amiodarone therapy   3. Chronic anticoagulation   4. Hypertensive heart disease without heart failure   5. LBBB (left bundle branch block)    PLAN:    In order of problems listed above:  It is obvious she is not can be able to remain on amiodarone long-term stop today her husband sees Dr. Crissie Sickles and referred for EP consultation.  This is a difficult case with limited options for antiarrhythmic drug therapy with her hypertrophic cardiomyopathy.  Other considerations would be atrial fibrillation catheter ablation or even consideration of AV nodal ablation and physiologic pacing. Continue her current anticoagulant Fortunately she has a nonobstructive hypertrophic cardiomyopathy and presently is not on beta-blocker or calcium channel blocker verapamil stopped in the hospital Stable EKG pattern BP at target continue current treatment includes ARB and low-dose thiazide diuretic She needs to be seen by EP as quickly as possible I will see her back in the office afterwards.    Medication Adjustments/Labs and Tests Ordered: Current medicines are reviewed at length with the patient today.  Concerns regarding medicines are outlined above.  Orders Placed This Encounter  Procedures   Ambulatory referral to Cardiac Electrophysiology   EKG 12-Lead   No orders of the defined types were placed in this encounter.   Chief complaint follow-up for my atrial fibrillation I feel terribly taking amiodarone dizzy nauseous and tremulous and I need to stop taking the drug   History of Present Illness:    Laurie Bowman is a 81 y.o. 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  Last seen by my partner Dr. Marlou Porch as an inpatient prior to discharge 09/03/2021.  She was admitted with atrial fibrillation elevated troponin low-level flat pattern not indicative of ACS she was treated with amiodarone loading and transition to oral and anticoagulated.  Compliance with diet, lifestyle and medications: Yes  Echocardiogram 09/01/2021 showed low normal EF 50 to 55% with severe asymmetric left ventricular hypertrophy.  She had mild mitral regurgitation and there was no left ventricular outflow tract obstruction.  Is very complex visit She is very emotionally distressed by her hospitalization He feels very badly and contributes with amiodarone she was dizzy dose reduced that is improved but she is still nauseous and tells me she needs a different medication Unfortunately she is not trending heart rate or blood pressure at home and does not have a smart watch to track her heart rhythm She does not think she has been back in atrial fibrillation.  Not having palpitation chest pain shortness of breath or syncope. 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 Possibly aggravated by Tamoxifen   Fibrocystic breast changes 07/08/2011   Fracture, [place], left, closed 05/03/2021   GERD (gastroesophageal reflux disease) 02/06/2016   Heart murmur, systolic 11/24/8336   Overview:  Overview:  Mild MR/TR, echo 2007 without change since 2001   Hyperlipidemia LDL goal <70 12/16/2013   Hyperlipidemia, unspecified 12/16/2013   Hypertension    Hypertensive heart disease 04/30/2018   Hypertrophic cardiomyopathy ([place]) 04/30/2018   Hypokalemia 09/27/2020   Infection due to Staphylococcus epidermidis 09/27/2020   Irritable bowel syndrome 12/16/2013   LBBB (left bundle branch block) 2/50/5397   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   Major depressive disorder, recurrent, unspecified ([place]) 12/16/2013   Mitral regurgitation 04/30/2018   Moderate persistent asthma 09/28/2015   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 06/21/2015   Other chest pain 09/27/2020   Other specified disorders of eyelid 11/09/2015   Pain in unspecified knee 12/16/2013   Peripheral visual field defect of both eyes 04/21/2015   PVC's (premature ventricular contractions) 04/30/2018   Rotator cuff tendonitis 12/16/2013   Undiagnosed cardiac murmurs 12/16/2013   Overview:  Mild MR/TR, echo 2007 without change since 2001   Urticaria    Ventricular premature depolarization 12/16/2013   Wound dehiscence 05/08/2015    Past Surgical History:  Procedure Laterality Date   ABDOMINAL ADHESION SURGERY     ADENOIDECTOMY     BREAST SURGERY     carpel  tunnel     CESAREAN SECTION     KNEE SURGERY     LEG SURGERY     bone graft   TONSILLECTOMY     VEIN SURGERY     Right Leg    Current Medications: No outpatient medications have been marked as taking for the 10/26/21 encounter (Office Visit) with Richardo Priest, MD.     Allergies:   Cefaclor, Codeine, Fructose, Sulfa antibiotics, Meperidine, and Elemental sulfur   Social History   Socioeconomic History   Marital status: Married    Spouse name: Not on file   Number of children: Not on file   Years of education: Not on file   Highest education level: Not on file  Occupational History   Not on file  Tobacco Use   Smoking status: Former    Types: Cigarettes    Passive exposure: Past   Smokeless tobacco: Never  Vaping Use   Vaping Use: Never used  Substance and Sexual Activity   Alcohol use: No    Alcohol/week: 0.0 standard drinks of alcohol   Drug use: No   Sexual activity: Never  Other Topics Concern   Not on file  Social History Narrative   Not on file   Social Determinants of Health   Financial Resource Strain: Not  on file  Food Insecurity: Not on file  Transportation Needs: Not on file  Physical Activity: Not on file  Stress: Not on file  Social Connections: Not on file     Family History: The patient's family history includes Alzheimer's disease in her sister; Breast cancer in her sister; Congestive Heart Failure in her mother; Dementia in her sister; Hyperlipidemia in her sister and sister; Hypertension in her mother; Stroke in her maternal grandmother. There is no history of Allergic rhinitis, Angioedema, Asthma, Eczema, Immunodeficiency, Urticaria, or Atopy. ROS:   Please see the history of present illness.    All other systems reviewed and are negative.  EKGs/Labs/Other Studies Reviewed:    The following studies were reviewed today:  EKG:  EKG ordered today and personally reviewed.  The ekg ordered today demonstrates sinus rhythm left bundle branch  block  Recent Labs: 08/31/2021: B Natriuretic Peptide 655.8; TSH 3.744 09/01/2021: ALT 38 09/02/2021: Hemoglobin 11.1; Magnesium 1.8; Platelets 186 09/03/2021: BUN 11; Creatinine, Ser 1.17; Potassium 4.1; Sodium 137  Recent Lipid Panel No results found for: "CHOL", "TRIG", "HDL", "CHOLHDL", "VLDL", "LDLCALC", "LDLDIRECT"  Physical Exam:    VS:  BP (!) 140/82 (BP Location: Right Arm, Patient Position: Sitting, Cuff Size: Normal)   Ht 5' (1.524 m)   Wt 137 lb 12.8 oz (62.5 kg)   LMP  (LMP Unknown)   BMI 26.91 kg/m     Wt Readings from Last 3 Encounters:  10/26/21 137 lb 12.8 oz (62.5 kg)  09/03/21 146 lb 8 oz (66.5 kg)  08/16/21 149 lb 0.6 oz (67.6 kg)     GEN:  Well nourished, well developed in no acute distress HEENT: Normal NECK: No JVD; No carotid bruits LYMPHATICS: No lymphadenopathy CARDIAC: RRR, no murmurs, rubs, gallops RESPIRATORY:  Clear to auscultation without rales, wheezing or rhonchi  ABDOMEN: Soft, non-tender, non-distended MUSCULOSKELETAL:  No edema; No deformity  SKIN: Warm and dry NEUROLOGIC:  Alert and oriented x 3 PSYCHIATRIC:  Normal affect    Signed, Shirlee More, MD  10/26/2021 3:53 PM    [facility]

## 2021-10-31 ENCOUNTER — Other Ambulatory Visit: Payer: Self-pay | Admitting: Cardiology

## 2021-10-31 NOTE — Telephone Encounter (Signed)
Prescription refill request for Eliquis received. Indication:Afib Last office visit:8/23 Scr:1.2 Age: 81 Weight:62.5 kg  Prescription refilled

## 2021-12-20 ENCOUNTER — Ambulatory Visit: Payer: Medicare PPO | Attending: Internal Medicine | Admitting: Internal Medicine

## 2021-12-20 ENCOUNTER — Ambulatory Visit: Payer: Medicare PPO | Attending: Internal Medicine

## 2021-12-20 VITALS — BP 132/82 | HR 98 | Ht 60.0 in | Wt 141.0 lb

## 2021-12-20 DIAGNOSIS — I10 Essential (primary) hypertension: Secondary | ICD-10-CM | POA: Diagnosis not present

## 2021-12-20 DIAGNOSIS — I480 Paroxysmal atrial fibrillation: Secondary | ICD-10-CM

## 2021-12-20 MED ORDER — VERAPAMIL HCL ER 120 MG PO TBCR: 120.0000 mg | EXTENDED_RELEASE_TABLET | Freq: Every day | ORAL | 5 refills | Status: DC

## 2021-12-20 NOTE — Progress Notes (Signed)
HPI Laurie Bowman is referred by Dr. Bettina Gavia for evaluation of atrial fib. She is a pleasant 81 yo woman with HCM, LBBB, and recent decline in her LV function from 65% to 50%. She had been on amiodarone but this medication was stopped. She had an episode of what sounds like rhabdo and developed atrial fib with a RVR.. she notes that her bp has been a little low. She was treated with amiodarone but this has been stopped.  Allergies  Allergen Reactions   Cefaclor Itching    Other reaction(s): Flushing (ALLERGY/intolerance)   Codeine Itching and Nausea And Vomiting   Fructose Swelling    Swelling and GI upset, Fructose Granules    Sulfa Antibiotics Swelling   Meperidine Nausea And Vomiting and Itching   Elemental Sulfur Swelling     Current Outpatient Medications  Medication Sig Dispense Refill   albuterol (PROAIR HFA) 108 (90 Base) MCG/ACT inhaler Use 1-2 puffs every 4-6 hours as needed for cough or wheeze. 18 g 1   albuterol (PROVENTIL) (2.5 MG/3ML) 0.083% nebulizer solution Take 3 mLs (2.5 mg total) by nebulization every 6 (six) hours as needed for wheezing or shortness of breath. 75 mL 0   apixaban (ELIQUIS) 5 MG TABS tablet TAKE 1 TABLET (5 MG TOTAL) BY MOUTH 2 TIMES DAILY FOR 30 DAYS. 60 tablet 5   augmented betamethasone dipropionate (DIPROLENE-AF) 0.05 % cream Apply 8.10 application topically daily.     benzonatate (TESSALON) 100 MG capsule Take 100 mg by mouth daily as needed for cough.     Biotin 1000 MCG tablet Take 1,000 mcg by mouth daily.      buPROPion (WELLBUTRIN SR) 150 MG 12 hr tablet Take 150 mg by mouth daily.      buPROPion (WELLBUTRIN XL) 150 MG 24 hr tablet Take 150 mg by mouth daily.     Cholecalciferol (VITAMIN D) 50 MCG (2000 UT) CAPS Take 2,000 Units by mouth daily.     Cholecalciferol 50 MCG (2000 UT) TABS Take by mouth.     clobetasol cream (TEMOVATE) 1.75 % Apply 1 application. topically daily.     clotrimazole-betamethasone (LOTRISONE) cream Apply 1  application. topically 2 (two) times daily.     diclofenac Sodium (VOLTAREN) 1 % GEL Apply 2 g topically 4 (four) times daily. Both knees     diclofenac Sodium (VOLTAREN) 1 % GEL Apply topically.     dicyclomine (BENTYL) 20 MG tablet Take 20 mg by mouth daily as needed for spasms.     fexofenadine (ALLEGRA) 180 MG tablet Take 180 mg by mouth daily.      fluocinonide cream (LIDEX) 1.02 % Apply 1 application topically 2 (two) times daily.      fluticasone (FLOVENT HFA) 110 MCG/ACT inhaler Inhale 2 puffs into the lungs 2 (two) times daily. Use with spacer. (Patient taking differently: Inhale 2 puffs into the lungs 2 (two) times daily as needed (shortness of breath and wheezing).) 12 g 5   lansoprazole (PREVACID) 30 MG capsule Take 1 capsule (30 mg total) by mouth 2 (two) times daily before a meal. 30 capsule 5   montelukast (SINGULAIR) 10 MG tablet TAKE 1 TABLET BY MOUTH EVERYDAY AT BEDTIME (Patient taking differently: Take 10 mg by mouth at bedtime.) 30 tablet 5   olopatadine (PATANOL) 0.1 % ophthalmic solution Place 1 drop into both eyes 2 (two) times daily as needed. 5 mL 5   Polyethylene Glycol 3350 (PEG 3350) POWD Take 17 g by mouth  daily.      potassium chloride (KLOR-CON) 10 MEQ tablet Take 10 mEq by mouth 2 (two) times daily.     rosuvastatin (CRESTOR) 10 MG tablet Take 10 mg by mouth daily.     tiZANidine (ZANAFLEX) 4 MG tablet Take 4 mg by mouth at bedtime as needed for muscle spasms.     triamcinolone cream (KENALOG) 0.1 % Apply 1 application. topically 2 (two) times daily.     triamcinolone cream (KENALOG) 0.1 % Apply topically.     verapamil (CALAN-SR) 120 MG CR tablet Take 1 tablet (120 mg total) by mouth daily. 60 tablet 5   calcium-vitamin D (OSCAL WITH D) 500-200 MG-UNIT TABS tablet Take 1 tablet by mouth daily. (Patient not taking: Reported on 12/20/2021)     hydrochlorothiazide (HYDRODIURIL) 25 MG tablet Take 1 tablet (25 mg total) by mouth daily. 30 tablet 0   No current  facility-administered medications for this visit.     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 Possibly aggravated by Tamoxifen   Fibrocystic breast changes 07/08/2011   Fracture, [place], left, closed 05/03/2021   GERD (gastroesophageal reflux disease) 02/06/2016   Heart murmur, systolic 88/07/275   Overview:  Overview:  Mild MR/TR, echo 2007 without change since 2001   Hyperlipidemia LDL goal <70 12/16/2013   Hyperlipidemia, unspecified 12/16/2013   Hypertension    Hypertensive heart disease 04/30/2018   Hypertrophic cardiomyopathy ([place]) 04/30/2018   Hypokalemia 09/27/2020   Infection due to Staphylococcus epidermidis 09/27/2020   Irritable bowel syndrome 12/16/2013   LBBB (left bundle branch block) 06/27/8784   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   Major depressive disorder, recurrent, unspecified ([place]) 12/16/2013   Mitral regurgitation 04/30/2018   Moderate persistent asthma 09/28/2015   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   Myogenic ptosis  of eyelid of both eyes 04/14/2015   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 06/21/2015   Other chest pain 09/27/2020   Other specified disorders of eyelid 11/09/2015   Pain in unspecified knee 12/16/2013   Peripheral visual field defect of both eyes 04/21/2015   PVC's (premature ventricular contractions) 04/30/2018   Rotator cuff tendonitis 12/16/2013   Undiagnosed cardiac murmurs 12/16/2013   Overview:  Mild MR/TR, echo 2007 without change since 2001   Urticaria    Ventricular premature depolarization 12/16/2013   Wound dehiscence 05/08/2015    ROS:   All systems reviewed and negative except as noted in the HPI.   Past Surgical History:  Procedure Laterality Date   ABDOMINAL ADHESION SURGERY  ADENOIDECTOMY     BREAST SURGERY     carpel tunnel     CESAREAN SECTION     KNEE SURGERY     LEG SURGERY     bone graft   TONSILLECTOMY     VEIN SURGERY     Right Leg     Family History  Problem Relation Age of Onset   Congestive Heart Failure Mother    Hypertension Mother    Hyperlipidemia Sister    Dementia Sister    Alzheimer's disease Sister    Stroke Maternal Grandmother    Breast cancer Sister    Hyperlipidemia Sister    Allergic rhinitis Neg Hx    Angioedema Neg Hx    Asthma Neg Hx    Eczema Neg Hx    Immunodeficiency Neg Hx    Urticaria Neg Hx    Atopy Neg Hx      Social History   Socioeconomic History   Marital status: Married    Spouse name: Not on file   Number of children: Not on file   Years of education: Not on file   Highest education level: Not on file  Occupational History   Not on file  Tobacco Use   Smoking status: Former    Types: Cigarettes    Passive exposure: Past   Smokeless tobacco: Never  Vaping Use   Vaping Use: Never used  Substance and Sexual Activity   Alcohol use: No    Alcohol/week: 0.0 standard drinks of alcohol   Drug use: No   Sexual activity: Never  Other Topics Concern   Not on file  Social History Narrative   Not on file    Social Determinants of Health   Financial Resource Strain: Not on file  Food Insecurity: Not on file  Transportation Needs: Not on file  Physical Activity: Not on file  Stress: Not on file  Social Connections: Not on file  Intimate Partner Violence: Not on file     BP 132/82   Pulse 98   Ht 5' (1.524 m)   Wt 141 lb (64 kg)   LMP  (LMP Unknown)   SpO2 (!) 68%   BMI 27.54 kg/m   Physical Exam:  Well appearing NAD HEENT: Unremarkable Neck:  No JVD, no thyromegally Lymphatics:  No adenopathy Back:  No CVA tenderness Lungs:  Clear HEART:  Regular rate rhythm, no murmurs, no rubs, no clicks Abd:  soft, positive bowel sounds, no organomegally, no rebound, no guarding Ext:  2 plus pulses, no edema, no cyanosis, no clubbing Skin:  No rashes no nodules Neuro:  CN II through XII intact, motor grossly intact  EKG - nsr with LBBB  DEVICE  Normal device function.  See PaceArt for details.   Assess/Plan:  HCM - I think she would do better with a low dose of verapamil rather than after load reduction. I will restart low dose verapamil. If she develops hypotension, we may have to reconsider. LV dysfunction - her EF is down from 65 to 50%.  PAF - I do not think that she has had enough atrial fib to warrant an ablation either PVI or AV node. No indication for PPM at this time.  Laurie Bowman Akera Snowberger,MD

## 2021-12-20 NOTE — Progress Notes (Unsigned)
Enrolled for [employer] to mail a ZIO XT long term holter monitor to the patients address on file.  

## 2021-12-20 NOTE — Patient Instructions (Addendum)
Medication Instructions:  Your physician has recommended you make the following change in your medication:  STOP Taking: - Losartan  ( Today )  START TAKING: - VERAPAMIL ( CALAN SR ) 120 Mg  Take one ( 120 Mg ) tablet by mouth ONCE a day.     Lab Work: None ordered.  If you have labs (blood work) drawn today and your tests are completely normal, you will receive your results only by: [place] (if you have MyChart) OR A paper copy in the mail If you have any lab test that is abnormal or we need to change your treatment, we will call you to review the results.  Testing/Procedures: 14 DAY ZIO MONITOR ORDER BY DR Lovena Le.   Follow-Up: At [facility], you and your health needs are our priority.  As part of our continuing mission to provide you with exceptional heart care, we have created designated Provider Care Teams.  These Care Teams include your primary Cardiologist (physician) and Advanced Practice Providers (APPs -  Physician Assistants and Nurse Practitioners) who all work together to provide you with the care you need, when you need it.  We recommend signing up for the patient portal called "MyChart".  Sign up information is provided on this After Visit Summary.  MyChart is used to connect with patients for Virtual Visits (Telemedicine).  Patients are able to view lab/test results, encounter notes, upcoming appointments, etc.  Non-urgent messages can be sent to your provider as well.   To learn more about what you can do with MyChart, go to [email or web address].    Your next appointment:   PLEASE SCHEDULE A 3 MONTH FOLLOW UP APPOINTMENT WITH DR Lovena Le.    The format for your next appointment:   In Person  Provider:   Cristopher Peru, MD{or one of the following Advanced Practice Providers on your designated Care Team:   Tommye Standard, [place] Legrand Como "Jonni Sanger" Tillery, PA-C   [place] Monitor Instructions  Your physician has requested you wear a ZIO patch monitor  for 14 days.  This is a single patch monitor. [employer] supplies one patch monitor per enrollment. Additional stickers are not available. Please do not apply patch if you will be having a Nuclear Stress Test,  Echocardiogram, Cardiac CT, MRI, or Chest Xray during the period you would be wearing the  monitor. The patch cannot be worn during these tests. You cannot remove and re-apply the  ZIO XT patch monitor.  Your ZIO patch monitor will be mailed 3 day USPS to your address on file. It may take 3-5 days  to receive your monitor after you have been enrolled.  Once you have received your monitor, please review the enclosed instructions. Your monitor  has already been registered assigning a specific monitor serial # to you.  Billing and Patient Assistance Program Information  We have supplied [employer] with any of your insurance information on file for billing purposes. [employer] offers a sliding scale Patient Assistance Program for patients that do not have  insurance, or whose insurance does not completely cover the cost of the ZIO monitor.  You must apply for the Patient Assistance Program to qualify for this discounted rate.  To apply, please call [employer] at [phone], select option 4, select option 2, ask to apply for  Patient Assistance Program. Theodore Demark will ask your household income, and how many people  are in your household. They will quote your out-of-pocket cost based on that information.  [employer] will also be  able to set up a 30-month interest-free payment plan if needed.  Applying the monitor   Shave hair from upper left chest.  Hold abrader disc by orange tab. Rub abrader in 40 strokes over the upper left chest as  indicated in your monitor instructions.  Clean area with 4 enclosed alcohol pads. Let dry.  Apply patch as indicated in monitor instructions. Patch will be placed under collarbone on left  side of chest with arrow pointing upward.  Rub patch adhesive wings for 2  minutes. Remove white label marked "1". Remove the white  label marked "2". Rub patch adhesive wings for 2 additional minutes.  While looking in a mirror, press and release button in center of patch. A small green light will  flash 3-4 times. This will be your only indicator that the monitor has been turned on.  Do not shower for the first 24 hours. You may shower after the first 24 hours.  Press the button if you feel a symptom. You will hear a small click. Record Date, Time and  Symptom in the Patient Logbook.  When you are ready to remove the patch, follow instructions on the last 2 pages of Patient  Logbook. Stick patch monitor onto the last page of Patient Logbook.  Place Patient Logbook in the blue and white box. Use locking tab on box and tape box closed  securely. The blue and white box has prepaid postage on it. Please place it in the mailbox as  soon as possible. Your physician should have your test results approximately 7 days after the  monitor [facility]  Call [place]at [phone] you have questions regarding  your ZIO XT patch monitor. Call them immediately if you see an orange light blinking on your  monitor.  If your monitor falls off in less than 4 days, contact our Monitor department at [phone]  If your monitor becomes loose or falls off after 4 days call [employer] at [phone]  suggestions on securing your monitor

## 2021-12-24 DIAGNOSIS — I480 Paroxysmal atrial fibrillation: Secondary | ICD-10-CM

## 2021-12-24 DIAGNOSIS — I10 Essential (primary) hypertension: Secondary | ICD-10-CM

## 2021-12-24 NOTE — Addendum Note (Signed)
Addended by: Janan Halter F on: 12/24/2021 10:41 AM   Modules accepted: Orders

## 2022-01-08 ENCOUNTER — Ambulatory Visit: Payer: Medicare PPO | Admitting: Allergy & Immunology

## 2022-01-08 ENCOUNTER — Encounter: Payer: Self-pay | Admitting: Allergy & Immunology

## 2022-01-08 VITALS — BP 160/84 | HR 78 | Temp 98.2°F | Resp 18 | Ht 60.0 in | Wt 144.4 lb

## 2022-01-08 DIAGNOSIS — K219 Gastro-esophageal reflux disease without esophagitis: Secondary | ICD-10-CM

## 2022-01-08 DIAGNOSIS — J4540 Moderate persistent asthma, uncomplicated: Secondary | ICD-10-CM | POA: Diagnosis not present

## 2022-01-08 DIAGNOSIS — J3089 Other allergic rhinitis: Secondary | ICD-10-CM | POA: Diagnosis not present

## 2022-01-08 MED ORDER — MOMETASONE FUROATE 50 MCG/ACT NA SUSP: 2.0000 | Freq: Every day | NASAL | 5 refills | Status: DC

## 2022-01-08 MED ORDER — ALBUTEROL SULFATE HFA 108 (90 BASE) MCG/ACT IN AERS
INHALATION_SPRAY | RESPIRATORY_TRACT | 1 refills | Status: DC
Start: 2022-01-08 — End: 2022-03-15

## 2022-01-08 NOTE — Patient Instructions (Addendum)
1. Moderate persistent asthma, uncomplicated - Lung testing looks great today. - I think that we are not going to make any medication changes at this time.  - Daily controller medication(s): Singulair '10mg'$  daily  - Prior to physical activity: albuterol 2 puffs 10-15 minutes before physical activity. - Rescue medications: albuterol 4 puffs every 4-6 hours as needed or albuterol nebulizer one vial every 4-6 hours as needed - Changes during respiratory infections or worsening symptoms: Add on Flovent 155mg to 4 puffs twice daily for TWO WEEKS. - Asthma control goals:  * Full participation in all desired activities (may need albuterol before activity) * Albuterol use two time or less a week on average (not counting use with activity) * Cough interfering with sleep two time or less a month * Oral steroids no more than once a year * No hospitalizations  2. Allergic rhinitis - Continue with the Nasonex one spray per nostril twice daily as needed.  - Continue with Allegra daily (can use twice daily on the worst days).  - Continue with Singulair (montelukast) '10mg'$  daiy.   3. Gastroesophageal reflux disease - Continue with your Prevacid every morning and dietary restrictions.   4. Return in about 6 months (around 07/10/2022).   Please inform [place] any Emergency Department visits, hospitalizations, or changes in symptoms. Call [place]efore going to the ED for breathing or allergy symptoms since we might be able to fit you in for a sick visit. Feel free to contact u[place]anytime with any questions, problems, or concerns. We do have sick visits open EVERY DAY!   It was a pleasure to see you again today!  Websites that have reliable patient information: 1. American Academy of Asthma, Allergy, and Immunology: www.aaaai.org 2. Food Allergy Research and Education (FARE): foodallergy.org 3. Mothers of Asthmatics: http://www.asthmacommunitynetwork.org 4. American College of Allergy, Asthma, and Immunology:  www.acaai.org   COVID-19 Vaccine Information can be found at: [email or web address] questions related to vaccine distribution or appointments, please email [email or web address] or call [phone]     "Like" [facility]on Facebook and Instagram for our latest updates!       Make sure you are registered to vote! If you have moved or changed any of your contact information, you will need to get this updated before voting!  In some cases, you MAY be able to register to vote online: [email or web address]

## 2022-01-08 NOTE — Progress Notes (Signed)
FOLLOW UP  Date of Service/Encounter:  01/08/22   Assessment:   Moderate persistent asthma, uncomplicated   Perennial and seasonal allergic rhinitis   GERD  Complicated cardiac history including hypertrophic cardiomyopathy as well as left ventricular dysfunction and paroxysmal atrial fibrillation (followed by Dr. Lovena Le with Cardiology)  Plan/Recommendations:   1. Moderate persistent asthma, uncomplicated - Lung testing looks great today. - I think that we are not going to make any medication changes at this time.  - Daily controller medication(s): Singulair '10mg'$  daily  - Prior to physical activity: albuterol 2 puffs 10-15 minutes before physical activity. - Rescue medications: albuterol 4 puffs every 4-6 hours as needed or albuterol nebulizer one vial every 4-6 hours as needed - Changes during respiratory infections or worsening symptoms: Add on Flovent 160mg to 4 puffs twice daily for TWO WEEKS. - Asthma control goals:  * Full participation in all desired activities (may need albuterol before activity) * Albuterol use two time or less a week on average (not counting use with activity) * Cough interfering with sleep two time or less a month * Oral steroids no more than once a year * No hospitalizations  2. Allergic rhinitis - Continue with the Nasonex one spray per nostril twice daily as needed.  - Continue with Allegra daily (can use twice daily on the worst days).  - Continue with Singulair (montelukast) '10mg'$  daiy.   3. Gastroesophageal reflux disease - Continue with your Prevacid every morning and dietary restrictions.   4. Return in about 6 months (around 07/10/2022).    Subjective:   Laurie Bowman a 81y.o. female presenting today for follow up of  Chief Complaint  Patient presents with   Asthma    6 mth f/u - Pretty Good    Laurie Bowman a history of the following: Patient Active Problem List   Diagnosis Date Noted   Paroxysmal atrial fibrillation  with RVR ([facility] 08/31/2021   Rhabdomyolysis 08/30/2021   Fracture, Colles, left, closed 05/03/2021   Aortic atherosclerosis ([facility] 09/27/2020   Elevated WBC count 09/27/2020   Hypokalemia 09/27/2020   Infection due to Staphylococcus epidermidis 09/27/2020   Other chest pain 09/27/2020   Urticaria    Cancer ([facility]    Hypertrophic cardiomyopathy ([facility] 04/30/2018   Mitral regurgitation 04/30/2018   LBBB (left bundle branch block) 04/30/2018   PVC's (premature ventricular contractions) 04/30/2018   Asthma with acute exacerbation 06/10/2016   Acute sinusitis 06/10/2016   GERD (gastroesophageal reflux disease) 02/06/2016   Other specified disorders of eyelid 11/09/2015   Allergic rhinitis 09/28/2015   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 06/21/2015   Encounter for long-term (current) use of high-risk medication 06/20/2015   Wound dehiscence 05/08/2015   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   Peripheral visual field defect of both eyes 04/21/2015   Dermatochalasis of both upper eyelids 04/14/2015   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   Asthma 12/16/2013   Benign neoplasm of colon 12/16/2013   Constipation 12/16/2013   Dizziness 12/16/2013   Fatty (change of) liver, not elsewhere classified 12/16/2013   Undiagnosed cardiac murmurs 12/16/2013   Heart murmur, systolic 152/84/1324  Hyperlipidemia, unspecified 140/12/2723  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   Anxiety and depression 12/16/2013   Pain in unspecified knee 12/16/2013   Carpal tunnel syndrome 12/16/2013   Rotator cuff tendonitis 12/16/2013   Ventricular premature depolarization 12/16/2013   Abnormal mammogram 08/16/2013   Hypertension 07/08/2011   Fibrocystic breast changes  07/08/2011    History obtained from: chart review and patient.  Laurie Bowman is a 81 y.o. female presenting for a follow up visit.  She was last seen in April 2023.  At that time, we made Flovent as needed.  We continue with Singulair 10  mg daily.  For her rhinitis, we will continue with Nasonex as well as Allegra.  Reflux was controlled with Prevacid daily.  Since last visit, she has done well. She had a fracture on her left arm and wrist.  Per the record, this was a close Colles fracture.  She was diagnosed with osteoporosis. This was February 2023. She did not need surgery. She was with a trainer and unfortunately she has not been able to work out as much as she wanted. She had a calcium infusion and she unfortunately developed rhabdomyolysis. She was discharged on some new cardiac medications.  It looks like she was discharged on amiodarone.  At her last visit, her losartan was discontinued and she was started on verapamil.  Asthma/Respiratory Symptom History: She has only used Flovent a couple of times since the last time that I saw her. She was on albuterol nebulizers twice daily while in the hospital.  Her breathing has really been fairly decent.  If she has not experienced a hospitalization secondary to her rhabdomyolysis, she would actually be in pretty good shape.  She has not been using her Flovent much at all.  She does need new albuterol inhalers because they are out of date.  Allergic Rhinitis Symptom History: She remains on the Singulair 10 mg daily.  She also has a Nasonex and Allegra.  She has not been on antibiotics for any sinus infections.  GERD Symptom History: She remains on the Pepcid every morning.  She also has made some dietary changes to make this better.  She has had some issues with her husband.  He has heart failure as well with an ejection fraction less than 30%.  Otherwise, there have been no changes to her past medical history, surgical history, family history, or social history.    Review of Systems  Constitutional: Negative.  Negative for chills, fever, malaise/fatigue and weight loss.  HENT: Negative.  Negative for congestion, ear discharge and ear pain.   Eyes:  Negative for pain, discharge and  redness.  Respiratory:  Negative for cough, sputum production, shortness of breath and wheezing.   Cardiovascular: Negative.  Negative for chest pain and palpitations.  Gastrointestinal:  Negative for abdominal pain, constipation, diarrhea, heartburn, nausea and vomiting.  Skin: Negative.  Negative for itching and rash.  Neurological:  Negative for dizziness and headaches.  Endo/Heme/Allergies:  Negative for environmental allergies. Does not bruise/bleed easily.       Objective:   Blood pressure (!) 160/84, pulse 78, temperature 98.2 F (36.8 C), resp. rate 18, height 5' (1.524 m), weight 144 lb 6.4 oz (65.5 kg), SpO2 98 %. Body mass index is 28.2 kg/m.    Physical Exam Vitals reviewed.  Constitutional:      Appearance: She is well-developed.     Interventions: She is not intubated. HENT:     Head: Normocephalic and atraumatic.     Right Ear: Tympanic membrane, ear canal and external ear normal.     Left Ear: Tympanic membrane, ear canal and external ear normal.     Nose: No nasal deformity, septal deviation, mucosal edema or rhinorrhea.     Right Turbinates: Enlarged, swollen and pale.     Left Turbinates: Enlarged,  swollen and pale.     Right Sinus: No maxillary sinus tenderness or frontal sinus tenderness.     Left Sinus: No maxillary sinus tenderness or frontal sinus tenderness.     Mouth/Throat:     Mouth: Mucous membranes are not pale and not dry.     Pharynx: Uvula midline.  Eyes:     General: Lids are normal. No allergic shiner.       Right eye: No discharge.        Left eye: No discharge.     Conjunctiva/sclera: Conjunctivae normal.     Right eye: Right conjunctiva is not injected. No chemosis.    Left eye: Left conjunctiva is not injected. No chemosis.    Pupils: Pupils are equal, round, and reactive to light.  Cardiovascular:     Rate and Rhythm: Normal rate and regular rhythm.     Heart sounds: Normal heart sounds.  Pulmonary:     Effort: Pulmonary effort  is normal. No tachypnea, accessory muscle usage or respiratory distress. She is not intubated.     Breath sounds: Normal breath sounds. No wheezing, rhonchi or rales.     Comments: Moving air well in all lung fields. Chest:     Chest wall: No tenderness.  Lymphadenopathy:     Cervical: No cervical adenopathy.  Skin:    Coloration: Skin is not pale.     Findings: No abrasion, erythema, petechiae or rash. Rash is not papular, urticarial or vesicular.  Neurological:     Mental Status: She is alert.  Psychiatric:        Behavior: Behavior is cooperative.      Diagnostic studies:   Spirometry: results normal (FEV1: 1.42/84%, FVC: 1.87/85%, FEV1/FVC: 76%).    Spirometry consistent with normal pattern.    Allergy Studies: none        Salvatore Marvel, MD  [facility]

## 2022-01-11 ENCOUNTER — Other Ambulatory Visit: Payer: Self-pay | Admitting: Allergy & Immunology

## 2022-01-23 ENCOUNTER — Telehealth: Payer: Self-pay

## 2022-01-23 NOTE — Telephone Encounter (Signed)
-----   Message from Evans Lance, MD sent at 01/18/2022 11:03 PM EDT ----- No additional treatment. She only had NS SVT and no pauses

## 2022-01-23 NOTE — Telephone Encounter (Signed)
Pt called and shared Zio Monitor results with per Dr. Lovena Le as stated below.  Pt saw the report, and had no additional questions regarding Dr. Tanna Furry assessment.   Pt states that sometimes she has intermittent dizziness prior to taking her morning verapamil.  She records her blood pressures.  Pt advised to continue monitoring her BP, and if her verapamil is poorly controlling her BP, a provider visit may be required.  Pt states her BP is better as the day goes by, and the dizziness goes away.   Pt has upcoming appointment on 03/26/22 at 1030 am. Will review any symptoms then, but Pt advised to call us or go to ER for worsening symptoms.

## 2022-03-05 ENCOUNTER — Other Ambulatory Visit: Payer: Self-pay | Admitting: Cardiology

## 2022-03-15 ENCOUNTER — Other Ambulatory Visit: Payer: Self-pay | Admitting: Allergy & Immunology

## 2022-03-22 ENCOUNTER — Ambulatory Visit: Payer: Medicare PPO | Admitting: Internal Medicine

## 2022-03-26 ENCOUNTER — Ambulatory Visit: Payer: Medicare PPO | Attending: Internal Medicine | Admitting: Internal Medicine

## 2022-03-26 ENCOUNTER — Encounter: Payer: Self-pay | Admitting: Internal Medicine

## 2022-03-26 VITALS — BP 176/82 | HR 82 | Ht 60.0 in | Wt 148.8 lb

## 2022-03-26 DIAGNOSIS — I480 Paroxysmal atrial fibrillation: Secondary | ICD-10-CM | POA: Diagnosis not present

## 2022-03-26 DIAGNOSIS — I493 Ventricular premature depolarization: Secondary | ICD-10-CM | POA: Diagnosis not present

## 2022-03-26 DIAGNOSIS — I422 Other hypertrophic cardiomyopathy: Secondary | ICD-10-CM

## 2022-03-26 NOTE — Progress Notes (Signed)
HPI Laurie Bowman is referred by Dr. Bettina Gavia for evaluation of atrial fib. She is a pleasant 82 yo woman with HCM, LBBB, and recent decline in her LV function from 65% to 50%. She had been on amiodarone but this medication was stopped. She had an episode of what sounds like rhabdo and developed atrial fib with a RVR.. she notes that her bp has been a little low.   Allergies  Allergen Reactions   Cefaclor Itching    Other reaction(s): Flushing (ALLERGY/intolerance)   Codeine Itching and Nausea And Vomiting   Fructose Swelling    Swelling and GI upset, Fructose Granules    Sulfa Antibiotics Swelling   Meperidine Nausea And Vomiting and Itching   Elemental Sulfur Swelling     Current Outpatient Medications  Medication Sig Dispense Refill   albuterol (PROVENTIL) (2.5 MG/3ML) 0.083% nebulizer solution Take 3 mLs (2.5 mg total) by nebulization every 6 (six) hours as needed for wheezing or shortness of breath. 75 mL 0   albuterol (VENTOLIN HFA) 108 (90 Base) MCG/ACT inhaler USE 1-2 PUFFS EVERY 4-6 HOURS AS NEEDED FOR COUGH OR WHEEZE. 8.5 each 1   apixaban (ELIQUIS) 5 MG TABS tablet TAKE 1 TABLET (5 MG TOTAL) BY MOUTH 2 TIMES DAILY FOR 30 DAYS. 60 tablet 5   augmented betamethasone dipropionate (DIPROLENE-AF) 0.05 % cream Apply 9.50 application topically daily.     benzonatate (TESSALON) 100 MG capsule Take 100 mg by mouth daily as needed for cough.     Biotin 1000 MCG tablet Take 1,000 mcg by mouth daily.      buPROPion (WELLBUTRIN XL) 150 MG 24 hr tablet Take 150 mg by mouth daily.     calcium-vitamin D (OSCAL WITH D) 500-200 MG-UNIT TABS tablet Take 1 tablet by mouth daily.     Cholecalciferol (VITAMIN D) 50 MCG (2000 UT) CAPS Take 2,000 Units by mouth daily.     clobetasol cream (TEMOVATE) 9.32 % Apply 1 application. topically daily.     clotrimazole-betamethasone (LOTRISONE) cream Apply 1 application. topically 2 (two) times daily.     diclofenac Sodium (VOLTAREN) 1 % GEL Apply 2 g  topically 4 (four) times daily. Both knees     dicyclomine (BENTYL) 20 MG tablet Take 20 mg by mouth daily as needed for spasms.     fexofenadine (ALLEGRA) 180 MG tablet Take 180 mg by mouth daily.      fluocinonide cream (LIDEX) 6.71 % Apply 1 application topically 2 (two) times daily.      fluticasone (FLOVENT HFA) 110 MCG/ACT inhaler Inhale 2 puffs into the lungs 2 (two) times daily. Use with spacer. (Patient taking differently: Inhale 2 puffs into the lungs 2 (two) times daily as needed (shortness of breath and wheezing).) 12 g 5   lansoprazole (PREVACID) 30 MG capsule Take 1 capsule (30 mg total) by mouth 2 (two) times daily before a meal. 30 capsule 5   mometasone (NASONEX) 50 MCG/ACT nasal spray Place 2 sprays into the nose daily. 17 g 5   montelukast (SINGULAIR) 10 MG tablet TAKE 1 TABLET BY MOUTH EVERYDAY AT BEDTIME 90 tablet 1   olopatadine (PATANOL) 0.1 % ophthalmic solution Place 1 drop into both eyes 2 (two) times daily as needed. 5 mL 5   Polyethylene Glycol 3350 (PEG 3350) POWD Take 17 g by mouth daily.      potassium chloride (KLOR-CON) 10 MEQ tablet Take 10 mEq by mouth 2 (two) times daily.     rosuvastatin (  CRESTOR) 10 MG tablet Take 10 mg by mouth daily.     tiZANidine (ZANAFLEX) 4 MG tablet Take 4 mg by mouth at bedtime as needed for muscle spasms.     triamcinolone cream (KENALOG) 0.1 % Apply 1 application. topically 2 (two) times daily.     triamcinolone cream (KENALOG) 0.1 % Apply topically.     verapamil (CALAN-SR) 120 MG CR tablet Take 1 tablet (120 mg total) by mouth daily. 60 tablet 5   hydrochlorothiazide (HYDRODIURIL) 25 MG tablet Take 1 tablet (25 mg total) by mouth daily. 30 tablet 0   No current facility-administered medications for this visit.     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 Possibly aggravated by Tamoxifen   Fibrocystic breast changes 07/08/2011   Fracture, Colles, left, closed 05/03/2021   GERD (gastroesophageal reflux disease) 02/06/2016   Heart murmur, systolic 63/03/4968   Overview:  Overview:  Mild MR/TR, echo 2007 without change since 2001   Hyperlipidemia LDL goal <70 12/16/2013   Hyperlipidemia, unspecified 12/16/2013   Hypertension    Hypertensive heart disease 04/30/2018   Hypertrophic cardiomyopathy ([place]) 04/30/2018   Hypokalemia 09/27/2020   Infection due to Staphylococcus epidermidis 09/27/2020   Irritable bowel syndrome 12/16/2013   LBBB (left bundle branch block) 2/63/7858   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   Major depressive disorder, recurrent, unspecified ([place]) 12/16/2013   Mitral regurgitation 04/30/2018   Moderate persistent asthma 09/28/2015   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   Past Surgical History:  Procedure Laterality Date   ABDOMINAL ADHESION SURGERY     ADENOIDECTOMY     BREAST SURGERY     carpel tunnel     CESAREAN SECTION     KNEE SURGERY     LEG SURGERY     bone graft   TONSILLECTOMY     VEIN SURGERY     Right  Leg     Family History  Problem Relation Age of Onset   Congestive Heart Failure Mother    Hypertension Mother    Hyperlipidemia Sister    Dementia Sister    Alzheimer's disease Sister    Stroke Maternal Grandmother    Breast cancer Sister    Hyperlipidemia Sister    Allergic rhinitis Neg Hx    Angioedema Neg Hx    Asthma Neg Hx    Eczema Neg Hx    Immunodeficiency Neg Hx    Urticaria Neg Hx    Atopy Neg Hx      Social History   Socioeconomic History   Marital status: Married    Spouse name: Not on file   Number of children: Not on file   Years of education: Not on file   Highest education level: Not on file  Occupational History   Not on file  Tobacco Use   Smoking status: Former    Types: Cigarettes    Passive exposure: Past   Smokeless tobacco: Never  Vaping Use   Vaping Use: Never used  Substance and Sexual Activity   Alcohol use: No    Alcohol/week: 0.0 standard drinks of alcohol   Drug use: No   Sexual activity: Never  Other Topics Concern   Not on file  Social History Narrative   Not on file   Social Determinants of Health   Financial Resource Strain: Not on file  Food Insecurity: Not on file  Transportation Needs: Not on file  Physical Activity: Not on file  Stress: Not on file  Social Connections: Not on file  Intimate Partner Violence: Not  on file     BP (!) 176/82   Pulse 82   Ht 5' (1.524 m)   Wt 148 lb 12.8 oz (67.5 kg)   LMP  (LMP Unknown)   SpO2 97%   BMI 29.06 kg/m   Physical Exam:  Well appearing NAD HEENT: Unremarkable Neck:  No JVD, no thyromegally Lymphatics:  No adenopathy Back:  No CVA tenderness Lungs:  Clear HEART:  Regular rate rhythm, no murmurs, no rubs, no clicks Abd:  soft, positive bowel sounds, no organomegally, no rebound, no guarding Ext:  2 plus pulses, no edema, no cyanosis, no clubbing Skin:  No rashes no nodules Neuro:  CN II through XII intact, motor grossly intact  EKG  DEVICE  Normal device function.  See PaceArt for details.   Assess/Plan:  HCM - I think she has done better since we restarted low dose verapamil. If she develops hypotension, we may have to reconsider. LV dysfunction - her EF is down from 65 to 50%.  PAF - I do not think that she has had enough atrial fib to warrant an ablation either PVI or AV node. No indication for PPM at this time. HTN - she has a log her her HR's and blood pressures and her bp 's are welll controlled.   Carleene Overlie Fahed Morten,MD

## 2022-03-26 NOTE — Patient Instructions (Addendum)
Medication Instructions:  Your physician recommends that you continue on your current medications as directed. Please refer to the Current Medication list given to you today.  *If you need a refill on your cardiac medications before your next appointment, please call your pharmacy*  Lab Work: None ordered.  If you have labs (blood work) drawn today and your tests are completely normal, you will receive your results only by: MyChart Message (if you have MyChart) OR A paper copy in the mail If you have any lab test that is abnormal or we need to change your treatment, we will call you to review the results.  Testing/Procedures: None ordered.  Follow-Up: At [facility], you and your health needs are our priority.  As part of our continuing mission to provide you with exceptional heart care, we have created designated Provider Care Teams.  These Care Teams include your primary Cardiologist (physician) and Advanced Practice Providers (APPs -  Physician Assistants and Nurse Practitioners) who all work together to provide you with the care you need, when you need it.  We recommend signing up for the patient portal called "MyChart".  Sign up information is provided on this After Visit Summary.  MyChart is used to connect with patients for Virtual Visits (Telemedicine).  Patients are able to view lab/test results, encounter notes, upcoming appointments, etc.  Non-urgent messages can be sent to your provider as well.   To learn more about what you can do with MyChart, go to [email or web address].    Your next appointment:   Please schedule a 6 month follow up appointment with Dr. Taylor  The format for your next appointment:   In Person  Provider:   Gregg Taylor, MD{or one of the following Advanced Practice Providers on your designated Care Team:   Renee Ursuy, PA-C Michael "Andy" Tillery, PA-C   Important Information About Sugar       

## 2022-05-03 ENCOUNTER — Other Ambulatory Visit: Payer: Self-pay | Admitting: Cardiology

## 2022-05-03 DIAGNOSIS — I480 Paroxysmal atrial fibrillation: Secondary | ICD-10-CM

## 2022-05-03 NOTE — Telephone Encounter (Signed)
Eliquis 7m refill request received. Patient is 82years old, weight-67.5kg, Crea-1.17 on 09/03/21, Diagnosis-Afib, and last seen by Dr. TLovena Leon 03/26/22. Dose is appropriate based on dosing criteria. Will send in refill to requested pharmacy.

## 2022-05-17 ENCOUNTER — Telehealth: Payer: Self-pay | Admitting: *Deleted

## 2022-05-17 NOTE — Telephone Encounter (Signed)
Patient with diagnosis of atrial fibrillation on Eliquis for anticoagulation.    Procedure: left total knee replacement Date of procedure: TBD   CHA2DS2-VASc Score = 4   This indicates a 4.8% annual risk of stroke. The patient's score is based upon: CHF History: 0 HTN History: 1 Diabetes History: 0 Stroke History: 0 Vascular Disease History: 0 Age Score: 2 Gender Score: 1   CrCl 44 Platelet count 212  Per office protocol, patient can hold Eliquis for 3 days prior to procedure.   Patient will not need bridging with Lovenox (enoxaparin) around procedure.  **This guidance is not considered finalized until pre-operative APP has relayed final recommendations.**

## 2022-05-17 NOTE — Telephone Encounter (Signed)
   Pre-operative Risk Assessment    Patient Name: Laurie Bowman  DOB: 03-13-1941 MRN: [id number]      Request for Surgical Clearance    Procedure:   LEFT TOTAL KNEE REPLACEMENT  Date of Surgery:  Clearance TBD                                 Surgeon:   Surgeon's Group or Practice Name:  [facility] Phone number:  [phone] Fax number:  [phone]   Type of Clearance Requested:   - Medical  - Pharmacy:  Hold Apixaban (Eliquis) NOT INDICATED HOW LONG   Type of Anesthesia:   SPINAL W/ BLOCK   Additional requests/questions:    Astrid Divine   05/17/2022, 12:31 PM

## 2022-05-17 NOTE — Telephone Encounter (Signed)
Pt has been scheduled for tele pre op appt 07/01/22 @ 10:20. Med rec and consent are done.

## 2022-05-17 NOTE — Telephone Encounter (Signed)
Pt has been scheduled for tele pre op appt 07/01/22 @ 10:20. Med rec and consent are done.      Patient Consent for Virtual Visit        Laurie Bowman has provided verbal consent on 05/17/2022 for a virtual visit (video or telephone).   CONSENT FOR VIRTUAL VISIT FOR:  Laurie Bowman  By participating in this virtual visit I agree to the following:  I hereby voluntarily request, consent and authorize [facility] and its employed or contracted physicians, physician assistants, nurse practitioners or other licensed health care professionals (the Practitioner), to provide me with telemedicine health care services (the "Services") as deemed necessary by the treating Practitioner. I acknowledge and consent to receive the Services by the Practitioner via telemedicine. I understand that the telemedicine visit will involve communicating with the Practitioner through live audiovisual communication technology and the disclosure of certain medical information by electronic transmission. I acknowledge that I have been given the opportunity to request an in-person assessment or other available alternative prior to the telemedicine visit and am voluntarily participating in the telemedicine visit.  I understand that I have the right to withhold or withdraw my consent to the use of telemedicine in the course of my care at any time, without affecting my right to future care or treatment, and that the Practitioner or I may terminate the telemedicine visit at any time. I understand that I have the right to inspect all information obtained and/or recorded in the course of the telemedicine visit and may receive copies of available information for a reasonable fee.  I understand that some of the potential risks of receiving the Services via telemedicine include:  Delay or interruption in medical evaluation due to technological equipment failure or disruption; Information transmitted may not be sufficient (e.g. poor  resolution of images) to allow for appropriate medical decision making by the Practitioner; and/or  In rare instances, security protocols could fail, causing a breach of personal health information.  Furthermore, I acknowledge that it is my responsibility to provide information about my medical history, conditions and care that is complete and accurate to the best of my ability. I acknowledge that Practitioner's advice, recommendations, and/or decision may be based on factors not within their control, such as incomplete or inaccurate data provided by me or distortions of diagnostic images or specimens that may result from electronic transmissions. I understand that the practice of medicine is not an exact science and that Practitioner makes no warranties or guarantees regarding treatment outcomes. I acknowledge that a copy of this consent can be made available to me via my patient portal ([facility]), or I can request a printed copy by calling the office of [facility].    I understand that my insurance will be billed for this visit.   I have read or had this consent read to me. I understand the contents of this consent, which adequately explains the benefits and risks of the Services being provided via telemedicine.  I have been provided ample opportunity to ask questions regarding this consent and the Services and have had my questions answered to my satisfaction. I give my informed consent for the services to be provided through the use of telemedicine in my medical care

## 2022-05-17 NOTE — Telephone Encounter (Signed)
   Name: Laurie Bowman  DOB: 1941/03/16  MRN: [id number]  Primary Cardiologist: Shirlee More, MD  Chart reviewed as part of pre-operative protocol coverage. Because of Laurie Bowman past medical history and time since last visit, she will require a follow-up telephone visit in order to better assess preoperative cardiovascular risk.  Pre-op covering staff: - Please schedule appointment and call patient to inform them. If patient already had an upcoming appointment within acceptable timeframe, please add "pre-op clearance" to the appointment notes so provider is aware. - Please contact requesting surgeon's office via preferred method (i.e, phone, fax) to inform them of need for appointment prior to surgery.  Per office protocol, patient can hold Eliquis for 3 days prior to procedure.   Patient will not need bridging with Lovenox (enoxaparin) around procedure.  Elgie Collard, PA-C  05/17/2022, 4:50 PM

## 2022-06-04 ENCOUNTER — Other Ambulatory Visit: Payer: Self-pay | Admitting: Cardiology

## 2022-06-04 ENCOUNTER — Telehealth: Payer: Self-pay | Admitting: Cardiology

## 2022-06-04 NOTE — Telephone Encounter (Signed)
Pt stated she received a letter in the mail reminding her to make her 1 yr f/u with Dr. Bettina Gavia but she already has an appt with 7/18 with Dr. Lovena Le. Pt would like a callback regarding this matter to see if she should disregard or make the appt. Please advise

## 2022-07-01 ENCOUNTER — Ambulatory Visit: Payer: Medicare PPO | Attending: Cardiovascular Disease

## 2022-07-01 DIAGNOSIS — Z01810 Encounter for preprocedural cardiovascular examination: Secondary | ICD-10-CM | POA: Diagnosis not present

## 2022-07-01 NOTE — Progress Notes (Signed)
Virtual Visit via Telephone Note   Because of Laurie Bowman's co-morbid illnesses, she is at least at moderate risk for complications without adequate follow up.  This format is felt to be most appropriate for this patient at this time.  The patient did not have access to video technology/had technical difficulties with video requiring transitioning to audio format only (telephone).  All issues noted in this document were discussed and addressed.  No physical exam could be performed with this format.  Please refer to the patient's chart for her consent to telehealth for [facility].  Evaluation Performed:  Preoperative cardiovascular risk assessment _____________   Date:  07/01/2022   Patient ID:  Laurie Bowman, DOB 10-01-1940, MRN [id number] Patient Location:  Home Provider location:   Office  Primary Care Provider:  Elijio Miles., MD Primary Cardiologist:  Norman Herrlich, MD  Chief Complaint / Patient Profile   82 y.o. y/o female with a h/o paroxysmal atrial fibrillation, hypertension, LBBB, chronic anticoagulation who is pending left total knee replacement and presents today for telephonic preoperative cardiovascular risk assessment.  History of Present Illness    Laurie Bowman is a 82 y.o. female who presents via audio/video conferencing for a telehealth visit today.  Pt was last seen in cardiology clinic on 10/26/21 by Dr. Dulce Sellar.  At that time Laurie Bowman was emotional distressed by her hospitalization.  The patient is now pending procedure as outlined above. Since her last visit, she had no had any issues with her heart since she saw Dr. Ladona Ridgel in January. She tells me that her knees cause her a lot of pain and she knows her blood pressure is higher on those days.  No episodes of A-fib to her knowledge.  She is to go to the gym 3 days a week and work with a trainer but now it is too hard on her knees.  No shortness of breath or chest pain.  She has asthma but it is under  control.  BP had been on the higher side back in January when she saw Dr. Ladona Ridgel.  Her twin sister is having the same surgery a day after her.  She is able to meet 4 METS.  Per office protocol, patient can hold Eliquis for 3 days prior to procedure.   Patient will not need bridging with Lovenox (enoxaparin) around procedure.  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 Possibly aggravated by Tamoxifen   Fibrocystic breast changes 07/08/2011   Fracture, Colles, left,  closed 05/03/2021   GERD (gastroesophageal reflux disease) 02/06/2016   Heart murmur, systolic 12/16/2013   Overview:  Overview:  Mild MR/TR, echo 2007 without change since 2001   Hyperlipidemia LDL goal <70 12/16/2013   Hyperlipidemia, unspecified 12/16/2013   Hypertension    Hypertensive heart disease 04/30/2018   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   Hypokalemia 09/27/2020   Infection due to Staphylococcus epidermidis 09/27/2020   Irritable bowel  syndrome 12/16/2013   LBBB (left bundle branch block) 04/30/2018   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   Major depressive disorder, recurrent, unspecified (HCC) 12/16/2013   Mitral regurgitation 04/30/2018   Moderate persistent asthma 09/28/2015   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 06/21/2015   Other chest pain 09/27/2020   Other specified disorders of eyelid 11/09/2015   Pain in unspecified knee 12/16/2013   Peripheral visual field defect of both eyes 04/21/2015   PVC's (premature ventricular contractions) 04/30/2018   Rotator cuff tendonitis 12/16/2013   Undiagnosed cardiac murmurs 12/16/2013   Overview:  Mild MR/TR, echo 2007 without change since 2001   Urticaria    Ventricular premature depolarization 12/16/2013   Wound dehiscence 05/08/2015   Past Surgical History:  Procedure Laterality Date   ABDOMINAL ADHESION SURGERY     ADENOIDECTOMY     BREAST SURGERY     carpel tunnel     CESAREAN SECTION     KNEE SURGERY     LEG SURGERY     bone graft   TONSILLECTOMY     VEIN SURGERY     Right Leg    Allergies  Allergies  Allergen Reactions   Cefaclor Itching    Other reaction(s): Flushing (ALLERGY/intolerance)   Codeine Itching and Nausea And Vomiting   Fructose Swelling    Swelling and GI upset, Fructose Granules    Sulfa Antibiotics Swelling   Meperidine Nausea And Vomiting and Itching   Elemental Sulfur Swelling    Home Medications    Prior to Admission medications   Medication Sig Start Date End Date Taking? Authorizing Provider  albuterol (PROVENTIL) (2.5 MG/3ML) 0.083% nebulizer solution Take 3 mLs (2.5 mg total) by nebulization every 6 (six) hours as needed for wheezing or shortness of breath. 06/18/21   Alfonse Spruce, MD  albuterol (VENTOLIN HFA) 108 (90 Base) MCG/ACT inhaler USE 1-2 PUFFS EVERY 4-6 HOURS AS NEEDED FOR COUGH OR WHEEZE. 03/15/22   Alfonse Spruce, MD   apixaban (ELIQUIS) 5 MG TABS tablet Take 1 tablet (5 mg total) by mouth 2 (two) times daily. 05/03/22   Marinus Maw, MD  augmented betamethasone dipropionate (DIPROLENE-AF) 0.05 % cream Apply 0.05 application topically daily.    [provider]  benzonatate (TESSALON) 100 MG capsule Take 100 mg by mouth daily as needed for cough. 07/02/18   [provider]  Biotin 1000 MCG tablet Take 1,000 mcg by mouth daily.     [provider]  buPROPion (WELLBUTRIN XL) 150 MG 24 hr tablet Take 150 mg by mouth daily. 12/02/21   [provider]  calcium-vitamin D (OSCAL WITH D) 500-200 MG-UNIT TABS tablet Take 1 tablet by mouth daily.    [provider]  Cholecalciferol (VITAMIN D) 50 MCG (2000 UT) CAPS Take 2,000 Units by mouth daily.    [provider]  clobetasol cream (TEMOVATE) 0.05 % Apply 1 application. topically daily. 03/22/18   [provider]  clotrimazole-betamethasone Thurmond Butts)  cream Apply 1 application. topically 2 (two) times daily. 03/16/19   [provider]  diclofenac Sodium (VOLTAREN) 1 % GEL Apply 2 g topically 4 (four) times daily. Both knees 04/16/19   [provider]  dicyclomine (BENTYL) 20 MG tablet Take 20 mg by mouth daily as needed for spasms. 04/14/18   [provider]  fexofenadine (ALLEGRA) 180 MG tablet Take 180 mg by mouth daily.     [provider]  fluocinonide cream (LIDEX) 0.05 % Apply 1 application topically 2 (two) times daily.     [provider]  fluticasone (FLOVENT HFA) 110 MCG/ACT inhaler Inhale 2 puffs into the lungs 2 (two) times daily. Use with spacer. Patient taking differently: Inhale 2 puffs into the lungs 2 (two) times daily as needed (shortness of breath and wheezing). 07/10/21   Alfonse Spruce, MD  hydrochlorothiazide (HYDRODIURIL) 25 MG tablet Take 1 tablet (25 mg total) by mouth daily. 09/04/21 05/17/22  Arrien, York Ram, MD  lansoprazole  (PREVACID) 30 MG capsule Take 1 capsule (30 mg total) by mouth 2 (two) times daily before a meal. 07/10/21   Alfonse Spruce, MD  mometasone (NASONEX) 50 MCG/ACT nasal spray Place 2 sprays into the nose daily. 01/08/22   Alfonse Spruce, MD  montelukast (SINGULAIR) 10 MG tablet TAKE 1 TABLET BY MOUTH EVERYDAY AT BEDTIME 01/11/22   Alfonse Spruce, MD  olopatadine (PATANOL) 0.1 % ophthalmic solution Place 1 drop into both eyes 2 (two) times daily as needed. 04/18/20   Alfonse Spruce, MD  Polyethylene Glycol 3350 (PEG 3350) POWD Take 17 g by mouth daily.  06/24/16   [provider]  potassium chloride (KLOR-CON) 10 MEQ tablet Take 10 mEq by mouth 2 (two) times daily. 04/21/15   [provider]  rosuvastatin (CRESTOR) 10 MG tablet Take 10 mg by mouth daily. 07/13/21   [provider]  tiZANidine (ZANAFLEX) 4 MG tablet Take 4 mg by mouth at bedtime as needed for muscle spasms. 11/16/19   [provider]  triamcinolone cream (KENALOG) 0.1 % Apply 1 application. topically 2 (two) times daily. 12/14/18   [provider]  triamcinolone cream (KENALOG) 0.1 % Apply topically. 12/14/18   [provider]  verapamil (CALAN-SR) 120 MG CR tablet Take 1 tablet (120 mg total) by mouth daily. 12/20/21   Marinus Maw, MD    Physical Exam    Vital Signs:  Elizaveta Mattice does not have vital signs available for review today.  Given telephonic nature of communication, physical exam is limited. AAOx3. NAD. Normal affect.  Speech and respirations are unlabored.  Accessory Clinical Findings    None  Assessment & Plan    1.  Preoperative Cardiovascular Risk Assessment:  Ms. Holderman perioperative risk of a major cardiac event is 0.4% according to the Revised Cardiac Risk Index (RCRI).  Therefore, she is at low risk for perioperative complications.   Her functional capacity is good at 4.73 METs according to the Duke Activity Status Index  (DASI). Recommendations: According to ACC/AHA guidelines, no further cardiovascular testing needed.  The patient may proceed to surgery at acceptable risk.   Antiplatelet and/or Anticoagulation Recommendations:  Eliquis (Apixaban) can be held for 3 days prior to surgery.  Please resume post op when felt to be safe.     A copy of this note will be routed to requesting surgeon.  Time:   Today, I have spent 25 minutes with the patient with telehealth technology discussing medical  history, symptoms, and management plan.     Sharlene Dory, PA-C  07/01/2022, 8:08 AM

## 2022-07-02 ENCOUNTER — Other Ambulatory Visit: Payer: Self-pay | Admitting: Allergy & Immunology

## 2022-07-05 ENCOUNTER — Other Ambulatory Visit: Payer: Self-pay | Admitting: Allergy & Immunology

## 2022-07-11 ENCOUNTER — Other Ambulatory Visit: Payer: Self-pay

## 2022-07-11 ENCOUNTER — Encounter: Payer: Self-pay | Admitting: Allergy & Immunology

## 2022-07-11 ENCOUNTER — Ambulatory Visit: Payer: Medicare PPO | Admitting: Allergy & Immunology

## 2022-07-11 VITALS — BP 138/80 | HR 72 | Temp 98.2°F | Resp 20 | Ht 60.0 in | Wt 148.2 lb

## 2022-07-11 DIAGNOSIS — J3089 Other allergic rhinitis: Secondary | ICD-10-CM | POA: Diagnosis not present

## 2022-07-11 DIAGNOSIS — J0100 Acute maxillary sinusitis, unspecified: Secondary | ICD-10-CM

## 2022-07-11 DIAGNOSIS — K219 Gastro-esophageal reflux disease without esophagitis: Secondary | ICD-10-CM

## 2022-07-11 DIAGNOSIS — J4540 Moderate persistent asthma, uncomplicated: Secondary | ICD-10-CM | POA: Diagnosis not present

## 2022-07-11 MED ORDER — MONTELUKAST SODIUM 10 MG PO TABS: 10.0000 mg | ORAL_TABLET | Freq: Every day | ORAL | 1 refills | Status: DC

## 2022-07-11 MED ORDER — MOMETASONE FUROATE 50 MCG/ACT NA SUSP: 2.0000 | Freq: Every day | NASAL | 5 refills | Status: DC

## 2022-07-11 MED ORDER — PREDNISONE 10 MG PO TABS: ORAL_TABLET | ORAL | 0 refills | Status: DC

## 2022-07-11 NOTE — Progress Notes (Signed)
FOLLOW UP  Date of Service/Encounter:  07/11/22   Assessment:   Moderate persistent asthma, uncomplicated   Perennial and seasonal allergic rhinitis   GERD   Complicated cardiac history including hypertrophic cardiomyopathy as well as left ventricular dysfunction (followed by Dr. Ladona Ridgel with Cardiology)    Plan/Recommendations:   1. Moderate persistent asthma, uncomplicated - Lung testing looks great today. - I think that we are not going to make any medication changes at this time.  - Daily controller medication(s): Singulair 10mg  daily  - Prior to physical activity: albuterol 2 puffs 10-15 minutes before physical activity. - Rescue medications: albuterol 4 puffs every 4-6 hours as needed or albuterol nebulizer one vial every 4-6 hours as needed - Changes during respiratory infections or worsening symptoms: Add on Flovent to 4 puffs twice daily for TWO WEEKS. - Asthma control goals:  * Full participation in all desired activities (may need albuterol before activity) * Albuterol use two time or less a week on average (not counting use with activity) * Cough interfering with sleep two time or less a month * Oral steroids no more than once a year * No hospitalizations  2. Allergic rhinitis - Let's aggressively start the Nasonex one spray per nostril twice daily as well as Mucinex twice daily (samples provided). - Start the prednisone taper that I sent in (start tonight since we gave you two prednisone tablets here in clinic).  - Drink plenty of fluids to help the Mucinex work better. - Continue with the Singulair (montelukast) 10mg  daily.  - Call us if there is there is no improvement and we can send in an antibiotic.   3. Gastroesophageal reflux disease - Continue with your Prevacid every morning and dietary restrictions.   4. Return in about 6 months (around 01/10/2023).    Subjective:   Laurie Bowman is a 82 y.o. female presenting today for follow up of   Chief Complaint  Patient presents with   Asthma   Follow-up   Allergies    Watery and itching eye, drainage at the back of throat.    Laurie Bowman has a history of the following: Patient Active Problem List   Diagnosis Date Noted   Paroxysmal atrial fibrillation with RVR 08/31/2021   Rhabdomyolysis 08/30/2021   Fracture, Colles, left, closed 05/03/2021   Aortic atherosclerosis 09/27/2020   Elevated WBC count 09/27/2020   Hypokalemia 09/27/2020   Infection due to Staphylococcus epidermidis 09/27/2020   Other chest pain 09/27/2020   Urticaria    Cancer    Hypertrophic cardiomyopathy 04/30/2018   Mitral regurgitation 04/30/2018   LBBB (left bundle branch block) 04/30/2018   PVC's (premature ventricular contractions) 04/30/2018   Asthma with acute exacerbation 06/10/2016   Acute sinusitis 06/10/2016   GERD (gastroesophageal reflux disease) 02/06/2016   Other specified disorders of eyelid 11/09/2015   Allergic rhinitis 09/28/2015   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 06/21/2015   Encounter for long-term (current) use of high-risk medication 06/20/2015   Wound dehiscence 05/08/2015   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   Peripheral visual field defect of both eyes 04/21/2015   Dermatochalasis of both upper eyelids 04/14/2015   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   Asthma 12/16/2013   Benign neoplasm of colon 12/16/2013   Constipation 12/16/2013   Dizziness 12/16/2013   Fatty (change of) liver, not elsewhere classified 12/16/2013   Undiagnosed cardiac murmurs 12/16/2013   Heart murmur, systolic 12/16/2013   Hyperlipidemia, unspecified 12/16/2013   Lichen  sclerosus et atrophicus 12/16/2013   Anxiety and depression 12/16/2013   Pain in unspecified knee 12/16/2013   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  Ta is a 82 y.o. female presenting for a follow up visit.  She was last seen in October 2024.  At that time, lung testing looked great.  We continue with Singulair 10 mg daily as well as albuterol as needed.  She has Flovent that she adds during respiratory flares.  For the allergic rhinitis, we continue with Nasonex as well as Allegra and Singulair.  GERD was controlled with Prevacid every morning.  Since last visit, she has done fairly well.  Asthma/Respiratory Symptom History: Breathing is well controlled at this time. She did have some mucous production when she wasdoing the spirometry today. She remains on the Flovent which she uses during flares. She has not used her rescue inhaler too often. She does have some "tea kettling" occasionally but which she means wheezing. This is fairly rare.  She has not been to the emergency room.  She has not been on prednisone since we last saw her.  She does not member the last time she needed to add Flovent.  Allergic Rhinitis Symptom History: She is having itchy watery eyes and drainage down the back of her throat.  She does report that she started having the facial soreness 2-3 days ago. She is using Nasonex which she only started using in the last few days. She reports a lot of postnasal drip. She has been using Mucinex occasionally, but has not added it to this current flare.  GERD Symptom History: She remains on Prevacid brand-name every evening.  She also does some dietary changes to help prevent flares.  She has a gastroenterologist.  She has a history of hypertension. She did wear a monitor and she keeps a log of her blood pressures.   She is having a full knee replacement on May 20th. She is going to be attending "knee replacement camp". She had a visit recently and did not get along with the NP who was seeing her.   Otherwise, there have been no changes to her past  medical history, surgical history, family history, or social history.    Review of Systems  Constitutional: Negative.  Negative for chills, fever, malaise/fatigue and weight loss.  HENT:  Positive for congestion and sinus pain. Negative for ear discharge and ear pain.   Eyes:  Negative for pain, discharge and redness.  Respiratory:  Negative for cough, sputum production, shortness of breath and wheezing.   Cardiovascular: Negative.  Negative for chest pain and palpitations.  Gastrointestinal:  Negative for abdominal pain, constipation, diarrhea, heartburn, nausea and vomiting.  Skin: Negative.  Negative for itching and rash.  Neurological:  Negative for dizziness and headaches.  Endo/Heme/Allergies:  Negative for environmental allergies. Does not bruise/bleed easily.       Objective:   Blood pressure (!) 140/78, pulse 72, temperature 98.2 F (36.8 C), temperature source Temporal, resp. rate 20, weight 148 lb 3.2 oz (67.2 kg), SpO2 97 %. Body mass index is 28.94 kg/m.    Physical Exam Vitals reviewed.  Constitutional:      Appearance: She is well-developed.     Interventions: She is not intubated. HENT:     Head: Normocephalic and  atraumatic.     Right Ear: Tympanic membrane, ear canal and external ear normal.     Left Ear: Tympanic membrane, ear canal and external ear normal.     Nose: No nasal deformity, septal deviation, mucosal edema or rhinorrhea.     Right Turbinates: Enlarged, swollen and pale.     Left Turbinates: Enlarged, swollen and pale.     Right Sinus: Maxillary sinus tenderness present. No frontal sinus tenderness.     Left Sinus: Maxillary sinus tenderness present. No frontal sinus tenderness.     Comments: No nasal polyps.    Mouth/Throat:     Lips: Pink.     Mouth: Mucous membranes are moist. Mucous membranes are not pale and not dry.     Pharynx: Uvula midline.     Comments: Cobblestoning present. Eyes:     General: Lids are normal. No allergic  shiner.       Right eye: No discharge.        Left eye: No discharge.     Conjunctiva/sclera: Conjunctivae normal.     Right eye: Right conjunctiva is not injected. No chemosis.    Left eye: Left conjunctiva is not injected. No chemosis.    Pupils: Pupils are equal, round, and reactive to light.  Cardiovascular:     Rate and Rhythm: Normal rate and regular rhythm.     Heart sounds: Normal heart sounds.  Pulmonary:     Effort: Pulmonary effort is normal. No tachypnea, accessory muscle usage or respiratory distress. She is not intubated.     Breath sounds: Normal breath sounds. No wheezing, rhonchi or rales.     Comments: Moving air well in all lung fields. Chest:     Chest wall: No tenderness.  Lymphadenopathy:     Cervical: No cervical adenopathy.  Skin:    Coloration: Skin is not pale.     Findings: No abrasion, erythema, petechiae or rash. Rash is not papular, urticarial or vesicular.  Neurological:     Mental Status: She is alert.  Psychiatric:        Behavior: Behavior is cooperative.      Diagnostic studies:    Spirometry: results normal (FEV1: 1.68/100%, FVC: 2.16/99%, FEV1/FVC: 78%).    Spirometry consistent with normal pattern.    Allergy Studies: none       Malachi Bonds, MD  [facility]

## 2022-07-11 NOTE — Patient Instructions (Addendum)
1. Moderate persistent asthma, uncomplicated - Lung testing looks great today. - I think that we are not going to make any medication changes at this time.  - Daily controller medication(s): Singulair  daily  - Prior to physical activity: albuterol 2 puffs 10-15 minutes before physical activity. - Rescue medications: albuterol 4 puffs every 4-6 hours as needed or albuterol nebulizer one vial every 4-6 hours as needed - Changes during respiratory infections or worsening symptoms: Add on Flovent to 4 puffs twice daily for TWO WEEKS. - Asthma control goals:  * Full participation in all desired activities (may need albuterol before activity) * Albuterol use two time or less a week on average (not counting use with activity) * Cough interfering with sleep two time or less a month * Oral steroids no more than once a year * No hospitalizations  2. Allergic rhinitis - Let's aggressively start the Nasonex one spray per nostril twice daily as well as Mucinex twice daily (samples provided). - Start the prednisone taper that I sent in (start tonight since we gave you two prednisone tablets here in clinic).  - Drink plenty of fluids to help the Mucinex work better. - Continue with the Singulair (montelukast)  daily.  - Call us if there is there is no improvement and we can send in an antibiotic.   3. Gastroesophageal reflux disease - Continue with your Prevacid every morning and dietary restrictions.   4. Return in about 6 months (around 01/10/2023).   Please inform us of any Emergency Department visits, hospitalizations, or changes in symptoms. Call us before going to the ED for breathing or allergy symptoms since we might be able to fit you in for a sick visit. Feel free to contact us anytime with any questions, problems, or concerns. We do have sick visits open EVERY DAY!   It was a pleasure to see you again today!  Websites that have reliable patient information: 1. American  Academy of Asthma, Allergy, and Immunology: www.aaaai.org 2. Food Allergy Research and Education (FARE): foodallergy.org 3. Mothers of Asthmatics: http://www.asthmacommunitynetwork.org 4. American College of Allergy, Asthma, and Immunology: www.acaai.org   COVID-19 Vaccine Information can be found at: [email or web address] For questions related to vaccine distribution or appointments, please email [email or web address] or call [phone].     "Like" [place] on Facebook and Instagram for our latest updates!       Make sure you are registered to vote! If you have moved or changed any of your contact information, you will need to get this updated before voting!  In some cases, you MAY be able to register to vote online: [email or web address]

## 2022-08-05 HISTORY — PX: REPLACEMENT TOTAL KNEE: SUR1224

## 2022-08-08 DIAGNOSIS — I10 Essential (primary) hypertension: Secondary | ICD-10-CM | POA: Diagnosis not present

## 2022-08-08 DIAGNOSIS — I480 Paroxysmal atrial fibrillation: Secondary | ICD-10-CM | POA: Diagnosis not present

## 2022-08-08 DIAGNOSIS — E785 Hyperlipidemia, unspecified: Secondary | ICD-10-CM | POA: Diagnosis not present

## 2022-08-08 DIAGNOSIS — Z96652 Presence of left artificial knee joint: Secondary | ICD-10-CM | POA: Diagnosis not present

## 2022-09-04 ENCOUNTER — Other Ambulatory Visit: Payer: Self-pay | Admitting: Allergy & Immunology

## 2022-10-03 ENCOUNTER — Encounter: Payer: Self-pay | Admitting: Internal Medicine

## 2022-10-03 ENCOUNTER — Ambulatory Visit: Payer: Medicare PPO | Attending: Internal Medicine | Admitting: Internal Medicine

## 2022-10-03 VITALS — BP 120/78 | HR 85 | Ht 60.0 in | Wt 136.0 lb

## 2022-10-03 DIAGNOSIS — I519 Heart disease, unspecified: Secondary | ICD-10-CM | POA: Diagnosis not present

## 2022-10-03 DIAGNOSIS — I480 Paroxysmal atrial fibrillation: Secondary | ICD-10-CM

## 2022-10-03 DIAGNOSIS — I422 Other hypertrophic cardiomyopathy: Secondary | ICD-10-CM | POA: Diagnosis not present

## 2022-10-03 NOTE — Progress Notes (Signed)
HPI Laurie Bowman is referred by Dr. Dulce Sellar for evaluation of atrial fib. She is a pleasant 82 yo woman with HCM, LBBB, and recent decline in her LV function from 65% to 50%. She had been on amiodarone but this medication was stopped. She had an episode of what sounds like rhabdo and developed atrial fib with a RVR.. she notes that her bp has been a little low.   Allergies  Allergen Reactions   Cefaclor Itching    Other reaction(s): Flushing (ALLERGY/intolerance)   Codeine Itching and Nausea And Vomiting   Fructose Swelling    Swelling and GI upset, Fructose Granules    Sulfa Antibiotics Swelling   Meperidine Nausea And Vomiting and Itching   Elemental Sulfur Swelling     Current Outpatient Medications  Medication Sig Dispense Refill   acetaminophen (TYLENOL) 500 MG tablet Take 500 mg by mouth every 4 (four) hours as needed.     albuterol (PROVENTIL) (2.5 MG/3ML) 0.083% nebulizer solution Take 3 mLs (2.5 mg total) by nebulization every 6 (six) hours as needed for wheezing or shortness of breath. 75 mL 0   albuterol (VENTOLIN HFA) 108 (90 Base) MCG/ACT inhaler USE 1-2 PUFFS EVERY 4-6 HOURS AS NEEDED FOR COUGH OR WHEEZE. 8.5 each 1   apixaban (ELIQUIS) 5 MG TABS tablet Take 1 tablet (5 mg total) by mouth 2 (two) times daily. 60 tablet 5   augmented betamethasone dipropionate (DIPROLENE-AF) 0.05 % cream Apply 0.05 application topically daily.     benzonatate (TESSALON) 100 MG capsule Take 100 mg by mouth daily as needed for cough.     buPROPion (WELLBUTRIN XL) 150 MG 24 hr tablet Take 150 mg by mouth daily.     Cholecalciferol (VITAMIN D) 50 MCG (2000 UT) CAPS Take 2,000 Units by mouth daily.     clobetasol cream (TEMOVATE) 0.05 % Apply 1 application. topically daily.     clotrimazole-betamethasone (LOTRISONE) cream Apply 1 application. topically 2 (two) times daily.     diclofenac Sodium (VOLTAREN) 1 % GEL Apply 2 g topically 4 (four) times daily. Both knees     dicyclomine (BENTYL)  20 MG tablet Take 20 mg by mouth daily as needed for spasms.     fexofenadine (ALLEGRA) 180 MG tablet Take 180 mg by mouth daily.      fluocinonide cream (LIDEX) 0.05 % Apply 1 application topically 2 (two) times daily.      fluticasone (FLOVENT HFA) 110 MCG/ACT inhaler Inhale 2 puffs into the lungs 2 (two) times daily. Use with spacer. (Patient taking differently: Inhale 2 puffs into the lungs 2 (two) times daily as needed (shortness of breath and wheezing).) 12 g 5   lansoprazole (PREVACID) 30 MG capsule Take 1 capsule (30 mg total) by mouth 2 (two) times daily before a meal. 30 capsule 5   mometasone (NASONEX) 50 MCG/ACT nasal spray PLACE 2 SPRAYS INTO THE NOSE DAILY. 34 each 2   montelukast (SINGULAIR) 10 MG tablet Take 1 tablet (10 mg total) by mouth at bedtime. 90 tablet 1   olopatadine (PATANOL) 0.1 % ophthalmic solution Place 1 drop into both eyes 2 (two) times daily as needed. 5 mL 5   Polyethylene Glycol 3350 (PEG 3350) POWD Take 17 g by mouth daily.      potassium chloride (KLOR-CON) 10 MEQ tablet Take 10 mEq by mouth 2 (two) times daily.     rosuvastatin (CRESTOR) 10 MG tablet Take 10 mg by mouth daily.  tiZANidine (ZANAFLEX) 4 MG tablet Take 4 mg by mouth at bedtime as needed for muscle spasms.     triamcinolone cream (KENALOG) 0.1 % Apply 1 application. topically 2 (two) times daily.     verapamil (CALAN-SR) 120 MG CR tablet Take 1 tablet (120 mg total) by mouth daily. 60 tablet 5   Biotin 1000 MCG tablet Take 1,000 mcg by mouth daily.      calcium-vitamin D (OSCAL WITH D) 500-200 MG-UNIT TABS tablet Take 1 tablet by mouth daily.     hydrochlorothiazide (HYDRODIURIL) 25 MG tablet Take 1 tablet (25 mg total) by mouth daily. 30 tablet 0   predniSONE (DELTASONE) 10 MG tablet Take two tablets (20mg ) twice daily for three days, then one tablet (10mg ) twice daily for three days, then STOP. 18 tablet 0   No current facility-administered medications for this visit.     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   Hypokalemia 09/27/2020   Infection due to Staphylococcus epidermidis 09/27/2020   Irritable bowel syndrome 12/16/2013   LBBB (left bundle branch block) 04/30/2018   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   Major depressive disorder, recurrent, unspecified (HCC) 12/16/2013   Mitral regurgitation 04/30/2018   Moderate persistent asthma 09/28/2015   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   Osteopenia with high risk of  fracture 06/21/2015   Other chest pain 09/27/2020   Other specified disorders of eyelid 11/09/2015   Pain in unspecified knee 12/16/2013   Peripheral visual field defect of both eyes 04/21/2015   PVC's (premature ventricular contractions) 04/30/2018   Rotator cuff tendonitis 12/16/2013   Undiagnosed cardiac murmurs 12/16/2013   Overview:  Mild MR/TR, echo 2007 without change since 2001   Urticaria    Ventricular premature depolarization 12/16/2013   Wound dehiscence 05/08/2015    ROS:   All systems reviewed and negative except as noted in the HPI.   Past Surgical History:  Procedure Laterality Date   ABDOMINAL ADHESION SURGERY     ADENOIDECTOMY     BREAST SURGERY     carpel  tunnel     CESAREAN SECTION     KNEE SURGERY     LEG SURGERY     bone graft   TONSILLECTOMY     VEIN SURGERY     Right Leg     Family History  Problem Relation Age of Onset   Congestive Heart Failure Mother    Hypertension Mother    Hyperlipidemia Sister    Dementia Sister    Alzheimer's disease Sister    Stroke Maternal Grandmother    Breast cancer Sister    Hyperlipidemia Sister    Allergic rhinitis Neg Hx    Angioedema Neg Hx    Asthma Neg Hx    Eczema Neg Hx    Immunodeficiency Neg Hx    Urticaria Neg Hx    Atopy Neg Hx      Social History   Socioeconomic History   Marital status: Married    Spouse name: Not on file   Number of children: Not on file   Years of education: Not on file   Highest education level: Not on file  Occupational History   Not on file  Tobacco Use   Smoking status: Former    Types: Cigarettes    Passive exposure: Past   Smokeless tobacco: Never  Vaping Use   Vaping status: Never Used  Substance and Sexual Activity   Alcohol use: No    Alcohol/week: 0.0 standard drinks of alcohol   Drug use: No   Sexual activity: Never  Other Topics Concern   Not on file  Social History Narrative   Not on file   Social Determinants of Health   Financial Resource  Strain: Not on file  Food Insecurity: Low Risk  (08/30/2022)   [facility]   Food vital sign    Within the past 12 months, you worried that your food would run out before you got money to buy more: Never true    Within the past 12 months, the food you bought just didn't last and you didn't have money to get more. : Never true  Transportation Needs: Not on file (08/30/2022)  Physical Activity: Not on file  Stress: Not on file  Social Connections: Not on file  Intimate Partner Violence: Not on file     BP 120/78   Pulse 85   Ht 5' (1.524 m)   Wt 136 lb (61.7 kg)   LMP  (LMP Unknown)   SpO2 96%   BMI 26.56 kg/m   Physical Exam:  Well appearing NAD HEENT: Unremarkable Neck:  No JVD, no thyromegally Lymphatics:  No adenopathy Back:  No CVA tenderness Lungs:  Clear with no wheezes HEART:  Regular rate rhythm, no murmurs, no rubs, no clicks Abd:  soft, positive bowel sounds, no organomegally, no rebound, no guarding Ext:  2 plus pulses, no edema, no cyanosis, no clubbing Skin:  No rashes no nodules Neuro:  CN II through XII intact, motor grossly intact  EKG - nsr with LBBB  DEVICE  Normal device function.  See PaceArt for details.   Assess/Plan: HCM -she is minimally symptomatic. No change in meds. Atrial fib - she is minimally symptomatic. We will follow. HTN - her bp is controlled at home based on the log she brings in today. We will follow. Coags - she has not had any bleeding on eliquis. Continue.  Sharlot Gowda Nekeya Briski,MD

## 2022-10-03 NOTE — Patient Instructions (Signed)
Medication Instructions:  Your physician recommends that you continue on your current medications as directed. Please refer to the Current Medication list given to you today.  *If you need a refill on your cardiac medications before your next appointment, please call your pharmacy*   Lab Work: None ordered.  If you have labs (blood work) drawn today and your tests are completely normal, you will receive your results only by: MyChart Message (if you have MyChart) OR A paper copy in the mail If you have any lab test that is abnormal or we need to change your treatment, we will call you to review the results.   Testing/Procedures: None ordered.    Follow-Up: At [facility], you and your health needs are our priority.  As part of our continuing mission to provide you with exceptional heart care, we have created designated Provider Care Teams.  These Care Teams include your primary Cardiologist (physician) and Advanced Practice Providers (APPs -  Physician Assistants and Nurse Practitioners) who all work together to provide you with the care you need, when you need it.  We recommend signing up for the patient portal called "MyChart".  Sign up information is provided on this After Visit Summary.  MyChart is used to connect with patients for Virtual Visits (Telemedicine).  Patients are able to view lab/test results, encounter notes, upcoming appointments, etc.  Non-urgent messages can be sent to your provider as well.   To learn more about what you can do with MyChart, go to [email or web address].    Your next appointment:   6 months with Dr Ladona Ridgel

## 2022-10-04 ENCOUNTER — Other Ambulatory Visit: Payer: Self-pay | Admitting: Internal Medicine

## 2022-10-14 ENCOUNTER — Other Ambulatory Visit: Payer: Self-pay | Admitting: Cardiology

## 2022-10-26 ENCOUNTER — Other Ambulatory Visit: Payer: Self-pay | Admitting: Internal Medicine

## 2022-10-26 DIAGNOSIS — I480 Paroxysmal atrial fibrillation: Secondary | ICD-10-CM

## 2022-10-28 NOTE — Telephone Encounter (Signed)
Prescription refill request for Eliquis received. Indication:afib Last office visit:7/24 Scr:0.94  7/24 Age: 82 Weight:61.7  kg  Prescription refilled

## 2023-01-03 ENCOUNTER — Other Ambulatory Visit: Payer: Self-pay | Admitting: Allergy & Immunology

## 2023-01-09 ENCOUNTER — Encounter: Payer: Self-pay | Admitting: Allergy & Immunology

## 2023-01-09 ENCOUNTER — Ambulatory Visit: Payer: Medicare PPO | Admitting: Allergy & Immunology

## 2023-01-09 ENCOUNTER — Other Ambulatory Visit: Payer: Self-pay | Admitting: Allergy & Immunology

## 2023-01-09 ENCOUNTER — Other Ambulatory Visit: Payer: Self-pay

## 2023-01-09 VITALS — BP 130/78 | HR 68 | Temp 98.2°F | Resp 14

## 2023-01-09 DIAGNOSIS — K219 Gastro-esophageal reflux disease without esophagitis: Secondary | ICD-10-CM

## 2023-01-09 DIAGNOSIS — J4540 Moderate persistent asthma, uncomplicated: Secondary | ICD-10-CM | POA: Diagnosis not present

## 2023-01-09 DIAGNOSIS — R519 Headache, unspecified: Secondary | ICD-10-CM | POA: Diagnosis not present

## 2023-01-09 DIAGNOSIS — J0100 Acute maxillary sinusitis, unspecified: Secondary | ICD-10-CM

## 2023-01-09 DIAGNOSIS — J3089 Other allergic rhinitis: Secondary | ICD-10-CM

## 2023-01-09 MED ORDER — MONTELUKAST SODIUM 10 MG PO TABS: 10.0000 mg | ORAL_TABLET | Freq: Every day | ORAL | 1 refills | Status: DC

## 2023-01-09 MED ORDER — FLUTICASONE PROPIONATE HFA 110 MCG/ACT IN AERO: INHALATION_SPRAY | RESPIRATORY_TRACT | 5 refills | Status: DC

## 2023-01-09 MED ORDER — DOXYCYCLINE HYCLATE 100 MG PO TABS: 100.0000 mg | ORAL_TABLET | Freq: Two times a day (BID) | ORAL | 0 refills | Status: AC

## 2023-01-09 NOTE — Telephone Encounter (Signed)
Pts insurance doesn't cover flovent prefers arnuity
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
## 2023-01-09 NOTE — Patient Instructions (Addendum)
1. Moderate persistent asthma, uncomplicated - Lung testing not done today.  - We are not going to make any changes today.  - Daily controller medication(s): Singulair 10mg  daily  - Prior to physical activity: albuterol 2 puffs 10-15 minutes before physical activity. - Rescue medications: albuterol 4 puffs every 4-6 hours as needed or albuterol nebulizer one vial every 4-6 hours as needed - Changes during respiratory infections or worsening symptoms: Add on Flovent to 4 puffs twice daily for TWO WEEKS. - Asthma control goals:  * Full participation in all desired activities (may need albuterol before activity) * Albuterol use two time or less a week on average (not counting use with activity) * Cough interfering with sleep two time or less a month * Oral steroids no more than once a year * No hospitalizations  2. Allergic rhinitis with overlying sinusitis and bilateral cerumen impaction (ear wax) - COVID testing was negative.  - Add on doxycycline 100 mg twice daily for 7 days. - Continue with Nasonex one spray per nostril twice daily for another couple of weeks. - Continue with the Singulair (montelukast) 10mg  daily.  - We are referring you to ENT so that they can take care of your earwax.  3. Gastroesophageal reflux disease - Continue with your Prevacid every morning and dietary restrictions.   4. Return in about 6 months (around 07/10/2023).   Please inform us of any Emergency Department visits, hospitalizations, or changes in symptoms. Call us before going to the ED for breathing or allergy symptoms since we might be able to fit you in for a sick visit. Feel free to contact us anytime with any questions, problems, or concerns. We do have sick visits open EVERY DAY!   It was a pleasure to see you again today!  Websites that have reliable patient information: 1. American Academy of Asthma, Allergy, and Immunology: www.aaaai.org 2. Food Allergy Research and Education (FARE):  foodallergy.org 3. Mothers of Asthmatics: http://www.asthmacommunitynetwork.org 4. American College of Allergy, Asthma, and Immunology: www.acaai.org   COVID-19 Vaccine Information can be found at: [email or web address] For questions related to vaccine distribution or appointments, please email [email or web address] or call [phone].     "Like" [place] on Facebook and Instagram for our latest updates!       Make sure you are registered to vote! If you have moved or changed any of your contact information, you will need to get this updated before voting!  In some cases, you MAY be able to register to vote online: [email or web address]

## 2023-01-13 ENCOUNTER — Telehealth: Payer: Self-pay | Admitting: Allergy & Immunology

## 2023-01-13 DIAGNOSIS — H6123 Impacted cerumen, bilateral: Secondary | ICD-10-CM

## 2023-01-13 NOTE — Telephone Encounter (Signed)
Patient called stating she was supposed to have a referral to the ENT office but  has not heard anything asking for a call from the nurse to clarify

## 2023-01-13 NOTE — Telephone Encounter (Signed)
Can you look into this it appears no referral has been placed but its mentioned in dr gallaghers note

## 2023-01-15 NOTE — Addendum Note (Signed)
Addended by: Alfonse Spruce on: 01/15/2023 04:53 PM   Modules accepted: Orders

## 2023-01-15 NOTE — Telephone Encounter (Signed)
Can you please advise in regards to the referral for the patient?

## 2023-01-15 NOTE — Telephone Encounter (Signed)
Placed referral and routed to the [employer].   Malachi Bonds, MD [facility]

## 2023-01-16 ENCOUNTER — Encounter (INDEPENDENT_AMBULATORY_CARE_PROVIDER_SITE_OTHER): Payer: Self-pay | Admitting: Otolaryngology

## 2023-01-16 NOTE — Telephone Encounter (Signed)
Patient is scheduled to see Dr. Allena Katz with [facility] on 11/21 @ 10:30 A.M.   Thanks

## 2023-01-16 NOTE — Telephone Encounter (Signed)
Called and spoke with the patient and advised her of the change in inhaler to Arnuity from [employer]. Patient verbalized understanding and stated she did get the appointment scheduled with the ENT.

## 2023-01-18 NOTE — Telephone Encounter (Signed)
Thanks much 

## 2023-01-24 ENCOUNTER — Encounter (INDEPENDENT_AMBULATORY_CARE_PROVIDER_SITE_OTHER): Payer: Self-pay

## 2023-01-27 ENCOUNTER — Other Ambulatory Visit (HOSPITAL_BASED_OUTPATIENT_CLINIC_OR_DEPARTMENT_OTHER): Payer: Self-pay

## 2023-01-27 ENCOUNTER — Ambulatory Visit (INDEPENDENT_AMBULATORY_CARE_PROVIDER_SITE_OTHER): Payer: Medicare PPO | Admitting: Physician Assistant

## 2023-01-27 ENCOUNTER — Encounter (INDEPENDENT_AMBULATORY_CARE_PROVIDER_SITE_OTHER): Payer: Self-pay

## 2023-01-27 VITALS — Ht 60.0 in | Wt 139.0 lb

## 2023-01-27 DIAGNOSIS — H6123 Impacted cerumen, bilateral: Secondary | ICD-10-CM

## 2023-01-27 MED ORDER — INFLUENZA VAC A&B SURF ANT ADJ 0.5 ML IM SUSY
0.5000 mL | PREFILLED_SYRINGE | Freq: Once | INTRAMUSCULAR | 0 refills | Status: AC
  Filled 2023-01-27: qty 0.5, 1d supply, fill #0

## 2023-01-27 NOTE — Progress Notes (Signed)
Dear Dr. Nelson Chimes, Here is my assessment for our mutual patient, Laurie Bowman. Thank you for allowing me the opportunity to care for your patient. Please do not hesitate to contact me should you have any other questions. Sincerely, Burna Forts PA-C  Otolaryngology Clinic Note Referring provider: Dr. Nelson Chimes HPI:  Laurie Bowman is a 82 y.o. female kindly referred by Dr. Nelson Chimes for evaluation of cerumen impaction.  The patient notes that she had discomfort in her left ear.  She followed up with her primary care provider who informed her that she had rather significant cerumen impaction in both the ears.  At the time she was having a sinus infection was placed on doxycycline which she has now finished.  She denies any of the infectious signs or symptoms that she had at this time including sinus pressure.  Of the sinus related symptoms that she was having including sinus pressure.  She was referred to our office for evaluation of the impacted cerumen.  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  She denies any history of head or neck cancer, no history head or neck surgeries other than TMJ surgery.  She has a history of seasonal allergies and asthma and is currently taking albuterol, Flonase, Nasonex, Singulair for the symptoms.     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   Hypokalemia 09/27/2020   Infection due to Staphylococcus epidermidis 09/27/2020   Irritable bowel syndrome 12/16/2013   LBBB (left bundle branch block) 04/30/2018   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   Major depressive disorder, recurrent, unspecified (HCC) 12/16/2013   Mitral regurgitation 04/30/2018   Moderate persistent asthma 09/28/2015   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 06/21/2015   Other chest pain 09/27/2020   Other specified disorders of eyelid  11/09/2015   Pain in unspecified knee 12/16/2013   Peripheral visual field defect of both eyes 04/21/2015   PVC's (premature ventricular contractions) 04/30/2018   Rotator cuff tendonitis 12/16/2013   Undiagnosed cardiac murmurs 12/16/2013   Overview:  Mild MR/TR, echo 2007 without change since 2001   Urticaria     Ventricular premature depolarization 12/16/2013   Wound dehiscence 05/08/2015     Past Surgical History:  Procedure Laterality Date   ABDOMINAL ADHESION SURGERY     ADENOIDECTOMY     BREAST SURGERY     carpel tunnel     CESAREAN SECTION     KNEE SURGERY     LEG SURGERY     bone graft   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/2022   TONSILLECTOMY     VEIN SURGERY     Right Leg    Family History  Problem Relation Age of Onset   Congestive Heart Failure Mother    Hypertension Mother    Hyperlipidemia Sister    Dementia Sister    Alzheimer's disease Sister    Stroke Maternal Grandmother    Breast cancer Sister    Hyperlipidemia Sister    Allergic rhinitis Neg Hx    Angioedema Neg Hx    Asthma Neg Hx    Eczema Neg Hx    Immunodeficiency Neg Hx    Urticaria Neg Hx    Atopy Neg Hx      Social Connections: Not on file      Current Outpatient Medications:    acetaminophen (TYLENOL) 500 MG tablet, Take 500 mg by mouth every 4 (four) hours as needed., Disp: , Rfl:    albuterol (PROVENTIL) (2.5 MG/3ML) 0.083% nebulizer solution, Take 3 mLs (2.5 mg total) by nebulization every 6 (six) hours as needed for wheezing or shortness of breath., Disp: 75 mL, Rfl: 0   albuterol (VENTOLIN HFA) 108 (90 Base) MCG/ACT inhaler, USE 1-2 PUFFS EVERY 4-6 HOURS AS NEEDED FOR COUGH OR WHEEZE., Disp: 8.5 each, Rfl: 1   augmented betamethasone dipropionate (DIPROLENE-AF) 0.05 % cream, Apply 0.05 application topically daily., Disp: , Rfl:    benzonatate (TESSALON) 100 MG capsule, Take 100 mg by mouth daily as needed for cough., Disp: , Rfl:    buPROPion (WELLBUTRIN XL) 150 MG 24 hr tablet, Take 150 mg by mouth daily., Disp: , Rfl:    Cholecalciferol (VITAMIN D) 50 MCG (2000 UT) CAPS, Take 2,000 Units by mouth daily., Disp: , Rfl:    clobetasol cream (TEMOVATE) 0.05 %, Apply 1 application. topically daily., Disp: , Rfl:    clotrimazole-betamethasone (LOTRISONE) cream, Apply 1 application. topically 2 (two) times  daily., Disp: , Rfl:    diclofenac Sodium (VOLTAREN) 1 % GEL, Apply 2 g topically 4 (four) times daily. Both knees, Disp: , Rfl:    dicyclomine (BENTYL) 20 MG tablet, Take 20 mg by mouth daily as needed for spasms., Disp: , Rfl:    ELIQUIS 5 MG TABS tablet, TAKE 1 TABLET BY MOUTH TWICE A DAY, Disp: 60 tablet, Rfl: 5   fexofenadine (ALLEGRA) 180 MG tablet, Take 180 mg by mouth daily. , Disp: , Rfl:    fluocinonide cream (LIDEX) 0.05 %, Apply 1 application topically 2 (two) times daily. , Disp: , Rfl:    Fluticasone Furoate (ARNUITY ELLIPTA) 100 MCG/ACT AEPB, Inhale 1 puff into the lungs daily., Disp: 30 each, Rfl: 5   lansoprazole (PREVACID) 30 MG capsule, Take 1 capsule (30 mg total) by mouth 2 (two) times daily before a  meal., Disp: 30 capsule, Rfl: 5   mometasone (NASONEX) 50 MCG/ACT nasal spray, PLACE 2 SPRAYS INTO THE NOSE DAILY., Disp: 34 each, Rfl: 2   montelukast (SINGULAIR) 10 MG tablet, Take 1 tablet (10 mg total) by mouth at bedtime., Disp: 90 tablet, Rfl: 1   olopatadine (PATANOL) 0.1 % ophthalmic solution, Place 1 drop into both eyes 2 (two) times daily as needed., Disp: 5 mL, Rfl: 5   Polyethylene Glycol 3350 (PEG 3350) POWD, Take 17 g by mouth daily. , Disp: , Rfl:    potassium chloride (KLOR-CON) 10 MEQ tablet, Take 10 mEq by mouth 2 (two) times daily., Disp: , Rfl:    rosuvastatin (CRESTOR) 10 MG tablet, Take 10 mg by mouth daily., Disp: , Rfl:    tiZANidine (ZANAFLEX) 4 MG tablet, Take 4 mg by mouth at bedtime as needed for muscle spasms., Disp: , Rfl:    triamcinolone cream (KENALOG) 0.1 %, Apply 1 application. topically 2 (two) times daily., Disp: , Rfl:    verapamil (CALAN-SR) 120 MG CR tablet, TAKE 1 TABLET BY MOUTH EVERY DAY, Disp: 90 tablet, Rfl: 3   hydrochlorothiazide (HYDRODIURIL) 25 MG tablet, Take 1 tablet (25 mg total) by mouth daily., Disp: 30 tablet, Rfl: 0   Physical Exam:   Ht 5' (1.524 m)   Wt 139 lb (63 kg)   LMP  (LMP Unknown)   BMI 27.15 kg/m   Pertinent  Findings  CN II-XII intact  Bilateral EAC with impacted cerumen Weber 512: Equal Rinne 512: AC > BC b/l  Anterior rhinoscopy: Septum midline; bilateral inferior turbinates with enlarged, swollen, no obvious nasal polyps No lesions of oral cavity/oropharynx; dentition within normal limits No obviously palpable neck masses/lymphadenopathy/thyromegaly No respiratory distress or stridor  Seprately Identifiable Procedures:  Procedure: Bilateral ear microscopy and cerumen removal using microscope (CPT 08657) - Mod 50 Pre-procedure diagnosis: Cerumen impaction bilateral external ears Post-procedure diagnosis: same Indication:  cerumen impaction; given patient's otologic complaints and history as well as for improved and comprehensive examination of external ear and tympanic membrane, bilateral otologic examination using microscope was performed and impacted cerumen removed  Procedure: Patient was placed semi-recumbent. Both ear canals were examined using the microscope with findings above. Cerumen removed on left and on right using suction and currette with improvement in EAC examination and patency. Left: EAC was patent. TM was intact . Middle ear was aerated. Drainage: None Right: EAC had persistent hard impacted wax along the external auditory canal, centrally it was cleared. TM was intact . Middle ear was aerated . Drainage: None Patient tolerated the procedure well.      Impression & Plans:  Colita Fenech is a 82 y.o. female with bilateral cerumen impaction.  I was able to remove the cerumen on the left external auditory canal, however the right was severely impacted and very hard.  I was able to clear centrally but the remaining cerumen along the anterior canal was too hard to be removed without significant discomfort or trauma to the canal.  I did recommend the patient's start Debrox, and like to see her back in the office in 1 week for repeat evaluation and removal of the remaining  cerumen.  The patient verbalized understanding and agreement to today's plan had no further questions or concerns.   - f/u 1 weeks   Thank you for allowing me the opportunity to care for your patient. Please do not hesitate to contact me should you have any other questions.  Sincerely, Burna Forts PA-C  [facility] Phone: [phone] Fax: [phone]  01/27/2023, 10:07 AM

## 2023-01-27 NOTE — Patient Instructions (Addendum)
Please place 5 to 10 drops of carbamide peroxide (Debrox) into the affected ear. Keep the drops in your ear for several minutes by keeping your head tilted or placing a cotton ball in your ear. If needed, continue to use carbamide peroxide (Debrox) twice daily for up to 7 days. Please come see me in 1 weeks.

## 2023-02-06 ENCOUNTER — Encounter (INDEPENDENT_AMBULATORY_CARE_PROVIDER_SITE_OTHER): Payer: Self-pay

## 2023-02-06 ENCOUNTER — Institutional Professional Consult (permissible substitution) (INDEPENDENT_AMBULATORY_CARE_PROVIDER_SITE_OTHER): Payer: Medicare PPO

## 2023-02-06 ENCOUNTER — Ambulatory Visit (INDEPENDENT_AMBULATORY_CARE_PROVIDER_SITE_OTHER): Payer: Medicare PPO | Admitting: Physician Assistant

## 2023-02-06 ENCOUNTER — Ambulatory Visit (INDEPENDENT_AMBULATORY_CARE_PROVIDER_SITE_OTHER): Payer: Medicare PPO | Admitting: Audiology

## 2023-02-06 DIAGNOSIS — H903 Sensorineural hearing loss, bilateral: Secondary | ICD-10-CM | POA: Diagnosis not present

## 2023-02-06 DIAGNOSIS — H6123 Impacted cerumen, bilateral: Secondary | ICD-10-CM

## 2023-02-06 NOTE — Progress Notes (Signed)
Dear Dr. Alben Spittle, Here is my assessment for our mutual patient, Laurie Bowman. Thank you for allowing me the opportunity to care for your patient. Please do not hesitate to contact me should you have any other questions. Sincerely, Burna Forts PA-C  Otolaryngology Clinic Note Referring provider: Dr. Alben Spittle HPI:  Laurie Bowman is a 82 y.o. female kindly referred by Dr. Alben Spittle for follow-up evaluation of impacted cerumen.  Below is a recap of her previous office visit  Laurie Bowman is a 82 y.o. female kindly referred by Dr. Nelson Chimes for evaluation of cerumen impaction.  The patient notes that she had discomfort in her left ear.  She followed up with her primary care provider who informed her that she had rather significant cerumen impaction in both the ears.  At the time she was having a sinus infection was placed on doxycycline which she has now finished.  She denies any of the infectious signs or symptoms that she had at this time including sinus pressure.  Of the sinus related symptoms that she was having including sinus pressure.  She was referred to our office for evaluation of the impacted cerumen.  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  She denies any history of head or neck cancer, no history head or neck surgeries other than TMJ surgery.  She has a history of seasonal allergies and asthma and is currently taking albuterol, Flonase, Nasonex, Singulair for the symptoms.      At her last office visit I was able to remove the cerumen of the left ear, the right ear was too impacted, I started her on Debrox.  She notes she did use it for approximately 5 days.  Since that time she has denied any significant changes to her symptoms, she denies any significant pain, drainage, fever, swelling, or hearing changes.  She notes her right ear does feel stopped up.   Independent Review of Additional Tests or Records:  02/07/2023 Audiogram was independently  reviewed and interpreted by me and it reveals Right ear: Normal hearing sensitivity from 250 to 2000 Hz sloping to moderately severe sensorineural hearing loss from 3000-8000; 100% % word interpretation at 65 dB; type A tympanogram Left ear: Normal hearing sensitivities from 250 to 2000 Hz sloping to moderate sensorineural hearing loss from 3000-8000; 100% word interpretation at 65 dB; type A tympanogram  SNHL= Sensorineural hearing loss    PMH/Meds/All/SocHx/FamHx/ROS:   Past Medical History:  Diagnosis Date   Abnormal mammogram 08/16/2013   Acute sinusitis 06/10/2016   Allergic rhinitis 09/28/2015   Alopecia 12/16/2013   Aortic atherosclerosis (HCC) 09/27/2020   Asthma    Asthma with acute exacerbation 06/10/2016   Benign essential hypertension 07/08/2011   Benign neoplasm of colon 12/16/2013   Cancer (HCC)    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   Hypokalemia 09/27/2020   Infection due to Staphylococcus epidermidis 09/27/2020   Irritable bowel syndrome 12/16/2013   LBBB (left bundle branch block) 04/30/2018   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   Major depressive disorder, recurrent, unspecified (HCC) 12/16/2013   Mitral regurgitation 04/30/2018   Moderate persistent asthma 09/28/2015   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 06/21/2015   Other chest pain 09/27/2020   Other specified disorders of eyelid 11/09/2015   Pain in unspecified knee 12/16/2013   Peripheral visual field defect of both eyes 04/21/2015   PVC's (premature ventricular contractions) 04/30/2018   Rotator cuff tendonitis 12/16/2013   Undiagnosed cardiac murmurs 12/16/2013   Overview:  Mild MR/TR, echo 2007 without change since 2001   Urticaria    Ventricular premature depolarization 12/16/2013   Wound dehiscence 05/08/2015     Past Surgical History:  Procedure Laterality Date   ABDOMINAL ADHESION SURGERY     ADENOIDECTOMY     BREAST SURGERY     carpel tunnel     CESAREAN SECTION     KNEE SURGERY     LEG SURGERY     bone graft   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/2022   TONSILLECTOMY     VEIN SURGERY     Right Leg    Family History  Problem Relation Age of Onset   Congestive Heart Failure Mother    Hypertension Mother    Hyperlipidemia Sister    Dementia Sister    Alzheimer's disease Sister    Stroke Maternal Grandmother    Breast cancer Sister    Hyperlipidemia Sister    Allergic rhinitis Neg Hx    Angioedema Neg Hx    Asthma Neg Hx    Eczema Neg Hx    Immunodeficiency Neg Hx    Urticaria Neg Hx    Atopy Neg Hx      Social Connections: Not on file      Current Outpatient Medications:    acetaminophen (TYLENOL) 500 MG tablet, Take 500 mg by mouth every 4 (four) hours as needed., Disp: , Rfl:    albuterol (PROVENTIL) (2.5 MG/3ML)  0.083% nebulizer solution, Take 3 mLs (2.5 mg total) by nebulization every 6 (six) hours as needed for wheezing or shortness of breath., Disp: 75 mL, Rfl: 0   albuterol (VENTOLIN HFA) 108 (90 Base) MCG/ACT inhaler, USE 1-2 PUFFS EVERY 4-6 HOURS AS NEEDED FOR COUGH OR WHEEZE., Disp: 8.5 each, Rfl: 1   augmented betamethasone dipropionate (DIPROLENE-AF) 0.05 % cream, Apply 0.05 application topically daily., Disp: , Rfl:    benzonatate (TESSALON) 100 MG capsule, Take 100 mg by mouth daily as needed for cough., Disp: , Rfl:    buPROPion (WELLBUTRIN XL) 150 MG 24 hr tablet, Take 150 mg by mouth daily., Disp: , Rfl:    Cholecalciferol (VITAMIN D) 50 MCG (2000 UT) CAPS, Take 2,000 Units by mouth daily., Disp: , Rfl:    clobetasol cream (TEMOVATE) 0.05 %, Apply 1 application. topically daily., Disp: , Rfl:  clotrimazole-betamethasone (LOTRISONE) cream, Apply 1 application. topically 2 (two) times daily., Disp: , Rfl:    diclofenac Sodium (VOLTAREN) 1 % GEL, Apply 2 g topically 4 (four) times daily. Both knees, Disp: , Rfl:    dicyclomine (BENTYL) 20 MG tablet, Take 20 mg by mouth daily as needed for spasms., Disp: , Rfl:    ELIQUIS 5 MG TABS tablet, TAKE 1 TABLET BY MOUTH TWICE A DAY, Disp: 60 tablet, Rfl: 5   fexofenadine (ALLEGRA) 180 MG tablet, Take 180 mg by mouth daily. , Disp: , Rfl:    fluocinonide cream (LIDEX) 0.05 %, Apply 1 application topically 2 (two) times daily. , Disp: , Rfl:    Fluticasone Furoate (ARNUITY ELLIPTA) 100 MCG/ACT AEPB, Inhale 1 puff into the lungs daily., Disp: 30 each, Rfl: 5   hydrochlorothiazide (HYDRODIURIL) 25 MG tablet, Take 1 tablet (25 mg total) by mouth daily., Disp: 30 tablet, Rfl: 0   lansoprazole (PREVACID) 30 MG capsule, Take 1 capsule (30 mg total) by mouth 2 (two) times daily before a meal., Disp: 30 capsule, Rfl: 5   mometasone (NASONEX) 50 MCG/ACT nasal spray, PLACE 2 SPRAYS INTO THE NOSE DAILY., Disp: 34 each, Rfl: 2   montelukast (SINGULAIR) 10 MG tablet,  Take 1 tablet (10 mg total) by mouth at bedtime., Disp: 90 tablet, Rfl: 1   olopatadine (PATANOL) 0.1 % ophthalmic solution, Place 1 drop into both eyes 2 (two) times daily as needed., Disp: 5 mL, Rfl: 5   Polyethylene Glycol 3350 (PEG 3350) POWD, Take 17 g by mouth daily. , Disp: , Rfl:    potassium chloride (KLOR-CON) 10 MEQ tablet, Take 10 mEq by mouth 2 (two) times daily., Disp: , Rfl:    rosuvastatin (CRESTOR) 10 MG tablet, Take 10 mg by mouth daily., Disp: , Rfl:    tiZANidine (ZANAFLEX) 4 MG tablet, Take 4 mg by mouth at bedtime as needed for muscle spasms., Disp: , Rfl:    triamcinolone cream (KENALOG) 0.1 %, Apply 1 application. topically 2 (two) times daily., Disp: , Rfl:    verapamil (CALAN-SR) 120 MG CR tablet, TAKE 1 TABLET BY MOUTH EVERY DAY, Disp: 90 tablet, Rfl: 3   Physical Exam:   LMP  (LMP Unknown)   Pertinent Findings  CN II-XII intact Left EAC clear, TM intact with well pneumatic size middle ear space, right EAC impacted Weber 512: Equal Rinne 512: AC > BC b/l  Anterior rhinoscopy: Septum  No lesions of oral cavity/oropharynx; dentition within normal limits No obviously palpable neck masses/lymphadenopathy/thyromegaly No respiratory distress or stridor  Seprately Identifiable Procedures:  Procedure: Bilateral ear microscopy and cerumen removal right EAC  using microscope (CPT [phone]) - Mod 25 Pre-procedure diagnosis: Cerumen impaction right external ears Post-procedure diagnosis: same Indication:  cerumen impaction; given patient's otologic complaints and history as well as for improved and comprehensive examination of external ear and tympanic membrane, bilateral otologic examination using microscope was performed and impacted cerumen removed  Procedure: Patient was placed semi-recumbent. Both ear canals were examined using the microscope with findings above. Cerumen removed on left and on right using suction and currette with improvement in EAC examination and  patency. Left: EAC was patent. TM was intact . Middle ear was aerated. Drainage: none Right: EAC was patent. TM was intact . Middle ear was aerated. Drainage: none Patient tolerated the procedure well.      Impression & Plans:  Laurie Bowman is a 82 y.o. female with cerumen impaction  Cerumen impaction-  I was able  to completely remove the cerumen on the right side, she tolerated this without difficulty.  The patient may return on a as needed basis with any acute concerns or issues.  - f/u PRN   Thank you for allowing me the opportunity to care for your patient. Please do not hesitate to contact me should you have any other questions.  Sincerely, Burna Forts PA-C [facility] Phone: [phone] Fax: [phone]  02/06/2023, 10:39 AM

## 2023-02-06 NOTE — Patient Instructions (Signed)
To help with your buildup of earwax try using sweet oil. This can be purchased over the counter at your local pharmacy. Using a sterilized ear dropper, place a few drops into your ear twice per week. Cover the ear with a cotton ball, or warm compress, for 5 to 10 minutes. Rub gently. Wipe out any excess ear wax, and the oil, with a cotton ball or a wet cloth.

## 2023-02-06 NOTE — Progress Notes (Signed)
  [address] [place], [place] [address] [phone]  Audiological Evaluation    Name: Laurie Bowman     DOB:   1941/03/10      MRN:   [id number]                                                                                     Service Date: 02/06/2023        Patient was referred today for a hearing evaluation by Eyvonne Mechanic, PA-C.  Symptoms Yes Details  Hearing loss  []  Patient denied perceiving any hearing loss.  Tinnitus  []  Patient denied experiencing tinnitus.  Balance problems  [x]  Patient reported imbalance sensations after surgery.  Previous ear surgeries  []  Patient denied any previous ear surgeries.    Tympanogram: Right ear: Normal external ear canal volume with normal middle ear pressure and tympanic membrane compliance (Type A).  Left ear: Normal external ear canal volume with normal middle ear pressure and low tympanic membrane compliance (Type As).    Hearing Evaluation: The audiogram was completed using conventional audiometric techniques under insert earphones with good reliability.   The hearing test results indicate: Right ear: Normal hearing sensitivity from [phone] sloping to moderately-severe sensorineural hearing loss from 3000-8000 Hz. Left ear: Normal hearing sensitivity from [phone] Hz sloping to moderate sensorineural hearing loss from 3000-8000 Hz.  Speech Audiometry: Right ear- Speech Reception Threshold (SRT) was obtained at 25 dBHL. Left ear- Speech Reception Threshold (SRT) was obtained at 25 dBHL.   Word Recognition Score Tested using NU-6 (MLV) Right ear: 100% was obtained at a presentation level of 65 dBHL which is deemed as excellent understanding. Left ear: 100% was obtained at a presentation level of 65 dBHL which is deemed as excellent understanding.    Impression:  There is not a significant difference between puretone thresholds and word recognition scores between ears.   Recommendations: Repeat audiogram  when changes are perceived or per provider.   Conley Rolls Tomma Ehinger, AUD, CCC-A 02/06/23

## 2023-02-27 ENCOUNTER — Other Ambulatory Visit: Payer: Self-pay

## 2023-02-27 MED ORDER — ARNUITY ELLIPTA 100 MCG/ACT IN AEPB: 1.0000 | INHALATION_SPRAY | Freq: Every day | RESPIRATORY_TRACT | 1 refills | Status: DC

## 2023-03-02 ENCOUNTER — Other Ambulatory Visit: Payer: Self-pay

## 2023-03-02 ENCOUNTER — Emergency Department (HOSPITAL_BASED_OUTPATIENT_CLINIC_OR_DEPARTMENT_OTHER): Payer: Medicare PPO

## 2023-03-02 ENCOUNTER — Emergency Department (HOSPITAL_BASED_OUTPATIENT_CLINIC_OR_DEPARTMENT_OTHER)
Admission: EM | Admit: 2023-03-02 | Discharge: 2023-03-02 | Disposition: A | Discharge status: Home / Self Care | Payer: Medicare PPO | Attending: Emergency Medicine | Admitting: Emergency Medicine

## 2023-03-02 ENCOUNTER — Encounter (HOSPITAL_BASED_OUTPATIENT_CLINIC_OR_DEPARTMENT_OTHER): Payer: Self-pay | Admitting: Emergency Medicine

## 2023-03-02 DIAGNOSIS — R7989 Other specified abnormal findings of blood chemistry: Secondary | ICD-10-CM | POA: Diagnosis not present

## 2023-03-02 DIAGNOSIS — Z7901 Long term (current) use of anticoagulants: Secondary | ICD-10-CM | POA: Diagnosis not present

## 2023-03-02 DIAGNOSIS — R319 Hematuria, unspecified: Secondary | ICD-10-CM | POA: Diagnosis present

## 2023-03-02 DIAGNOSIS — D72829 Elevated white blood cell count, unspecified: Secondary | ICD-10-CM | POA: Insufficient documentation

## 2023-03-02 DIAGNOSIS — I10 Essential (primary) hypertension: Secondary | ICD-10-CM | POA: Insufficient documentation

## 2023-03-02 DIAGNOSIS — Z79899 Other long term (current) drug therapy: Secondary | ICD-10-CM | POA: Insufficient documentation

## 2023-03-02 LAB — CBC WITH DIFFERENTIAL/PLATELET
Abs Immature Granulocytes: 0.09 10*3/uL — ABNORMAL HIGH (ref 0.00–0.07)
Basophils Absolute: 0 10*3/uL (ref 0.0–0.1)
Basophils Relative: 0 %
Eosinophils Absolute: 0 10*3/uL (ref 0.0–0.5)
Eosinophils Relative: 0 %
HCT: 42 % (ref 36.0–46.0)
Hemoglobin: 14 g/dL (ref 12.0–15.0)
Immature Granulocytes: 1 %
Lymphocytes Relative: 19 %
Lymphs Abs: 2.2 10*3/uL (ref 0.7–4.0)
MCH: 31.4 pg (ref 26.0–34.0)
MCHC: 33.3 g/dL (ref 30.0–36.0)
MCV: 94.2 fL (ref 80.0–100.0)
Monocytes Absolute: 1 10*3/uL (ref 0.1–1.0)
Monocytes Relative: 9 %
Neutro Abs: 8.1 10*3/uL — ABNORMAL HIGH (ref 1.7–7.7)
Neutrophils Relative %: 71 %
Platelets: 259 10*3/uL (ref 150–400)
RBC: 4.46 MIL/uL (ref 3.87–5.11)
RDW: 12.7 % (ref 11.5–15.5)
WBC: 11.5 10*3/uL — ABNORMAL HIGH (ref 4.0–10.5)
nRBC: 0 % (ref 0.0–0.2)

## 2023-03-02 LAB — URINALYSIS, ROUTINE W REFLEX MICROSCOPIC
Bacteria, UA: NONE SEEN
Bilirubin Urine: NEGATIVE
Glucose, UA: NEGATIVE mg/dL
Ketones, ur: NEGATIVE mg/dL
Nitrite: NEGATIVE
RBC / HPF: >50 RBC/hpf (ref 0–5)
Specific Gravity, Urine: 1.01 (ref 1.005–1.030)
WBC, UA: >50 WBC/hpf (ref 0–5)
pH: 6.5 (ref 5.0–8.0)

## 2023-03-02 LAB — BASIC METABOLIC PANEL
Anion gap: 8 (ref 5–15)
BUN: 21 mg/dL (ref 8–23)
CO2: 29 mmol/L (ref 22–32)
Calcium: 9.9 mg/dL (ref 8.9–10.3)
Chloride: 99 mmol/L (ref 98–111)
Creatinine, Ser: 1.08 mg/dL — ABNORMAL HIGH (ref 0.44–1.00)
GFR, Estimated: 52 mL/min — ABNORMAL LOW (ref >60)
Glucose, Bld: 81 mg/dL (ref 70–99)
Potassium: 4.4 mmol/L (ref 3.5–5.1)
Sodium: 136 mmol/L (ref 135–145)

## 2023-03-02 LAB — CK: Total CK: 150 U/L (ref 38–234)

## 2023-03-02 MED ORDER — NITROFURANTOIN MONOHYD MACRO 100 MG PO CAPS
100.0000 mg | ORAL_CAPSULE | Freq: Once | ORAL | Status: AC
  Administered 2023-03-02: 100 mg via ORAL
  Filled 2023-03-02: qty 1

## 2023-03-02 MED ORDER — NITROFURANTOIN MONOHYD MACRO 100 MG PO CAPS: 100.0000 mg | ORAL_CAPSULE | Freq: Two times a day (BID) | ORAL | 0 refills | Status: AC

## 2023-03-02 NOTE — Discharge Instructions (Signed)
As discussed, we are treating you for a urinary tract infection.  Please take Macrobid 100 mg twice a day for the next 5 days.  Please complete full course of antibiotics to ensure resolution of infection.  We have sent your urine for culture, if the bacteria is not susceptible to the antibiotic you are currently taking we will switch you to another one at that time.  If you do not hear from [place] keep taking the prescribed medication.    Please follow-up with your primary care provider this week for reevaluation of your symptoms.  If your hematuria persists after resolution of antibiotics then you might need to follow-up with urology.  Get help right away if: You develop severe vomiting and are unable to take medicine without vomiting. You develop severe pain in your back or abdomen even though you are taking medicine. You pass a large amount of blood in your urine. You pass blood clots in your urine. You feel very weak or like you might faint. You faint.

## 2023-03-02 NOTE — ED Triage Notes (Signed)
Past 2 days urine looked amber,states she has been drinking water. On Tuesday she had an injection for her eye by opth (food coloring and urine was going to be yellow). No other symptoms

## 2023-03-03 NOTE — ED Provider Notes (Signed)
[facility] AT [facility] Provider Note   CSN: [id number] Arrival date & time: 03/02/23  1402     History  No chief complaint on file.   Laurie Bowman is a 82 y.o. female with a history of cancer, lichen sclerosus, and hypertension who presents the ED today for hematuria.  Patient reports for the past 2 days her urine has looked darker in color.  She states that when she was wiping earlier today she saw blood on the toilet paper and decided come in for further evaluation.  She denies pain or difficulty with urination, abdominal pain, back pain, or fever.  Endorses increased urination.  She states that she has never experienced anything like this before.  No additional complaints or concerns at this time.    Home Medications Prior to Admission medications   Medication Sig Start Date End Date Taking? Authorizing Provider  nitrofurantoin, macrocrystal-monohydrate, (MACROBID) 100 MG capsule Take 1 capsule (100 mg total) by mouth 2 (two) times daily for 5 days. 03/02/23 03/07/23 Yes Maxwell Marion, PA-C  acetaminophen (TYLENOL) 500 MG tablet Take 500 mg by mouth every 4 (four) hours as needed. 09/18/20   [provider]  albuterol (PROVENTIL) (2.5 MG/3ML) 0.083% nebulizer solution Take 3 mLs (2.5 mg total) by nebulization every 6 (six) hours as needed for wheezing or shortness of breath. 06/18/21   Alfonse Spruce, MD  albuterol (VENTOLIN HFA) 108 (90 Base) MCG/ACT inhaler USE 1-2 PUFFS EVERY 4-6 HOURS AS NEEDED FOR COUGH OR WHEEZE. 07/02/22   Alfonse Spruce, MD  augmented betamethasone dipropionate (DIPROLENE-AF) 0.05 % cream Apply 0.05 application topically daily.    [provider]  benzonatate (TESSALON) 100 MG capsule Take 100 mg by mouth daily as needed for cough. 07/02/18   [provider]  buPROPion (WELLBUTRIN XL) 150 MG 24 hr tablet Take 150 mg by mouth daily. 12/02/21   [provider]  Cholecalciferol (VITAMIN D)  50 MCG (2000 UT) CAPS Take 2,000 Units by mouth daily.    [provider]  clobetasol cream (TEMOVATE) 0.05 % Apply 1 application. topically daily. 03/22/18   [provider]  clotrimazole-betamethasone (LOTRISONE) cream Apply 1 application. topically 2 (two) times daily. 03/16/19   [provider]  diclofenac Sodium (VOLTAREN) 1 % GEL Apply 2 g topically 4 (four) times daily. Both knees 04/16/19   [provider]  dicyclomine (BENTYL) 20 MG tablet Take 20 mg by mouth daily as needed for spasms. 04/14/18   [provider]  ELIQUIS 5 MG TABS tablet TAKE 1 TABLET BY MOUTH TWICE A DAY 10/28/22   Marinus Maw, MD  fexofenadine (ALLEGRA) 180 MG tablet Take 180 mg by mouth daily.     [provider]  fluocinonide cream (LIDEX) 0.05 % Apply 1 application topically 2 (two) times daily.     [provider]  Fluticasone Furoate (ARNUITY ELLIPTA) 100 MCG/ACT AEPB Inhale 1 puff into the lungs daily. 02/27/23   Alfonse Spruce, MD  hydrochlorothiazide (HYDRODIURIL) 25 MG tablet Take 1 tablet (25 mg total) by mouth daily. 09/04/21 05/17/22  Arrien, York Ram, MD  lansoprazole (PREVACID) 30 MG capsule Take 1 capsule (30 mg total) by mouth 2 (two) times daily before a meal. 07/10/21   Alfonse Spruce, MD  mometasone (NASONEX) 50 MCG/ACT nasal spray PLACE 2 SPRAYS INTO THE NOSE DAILY. 09/04/22   Alfonse Spruce, MD  montelukast (SINGULAIR) 10 MG tablet Take 1 tablet (10 mg total) by mouth  at bedtime. 01/09/23   Alfonse Spruce, MD  olopatadine (PATANOL) 0.1 % ophthalmic solution Place 1 drop into both eyes 2 (two) times daily as needed. 04/18/20   Alfonse Spruce, MD  Polyethylene Glycol 3350 (PEG 3350) POWD Take 17 g by mouth daily.  06/24/16   [provider]  potassium chloride (KLOR-CON) 10 MEQ tablet Take 10 mEq by mouth 2 (two) times daily. 04/21/15   [provider]  rosuvastatin (CRESTOR) 10 MG tablet Take  10 mg by mouth daily. 07/13/21   [provider]  tiZANidine (ZANAFLEX) 4 MG tablet Take 4 mg by mouth at bedtime as needed for muscle spasms. 11/16/19   [provider]  triamcinolone cream (KENALOG) 0.1 % Apply 1 application. topically 2 (two) times daily. 12/14/18   [provider]  verapamil (CALAN-SR) 120 MG CR tablet TAKE 1 TABLET BY MOUTH EVERY DAY 10/07/22   Marinus Maw, MD      Allergies    Cefaclor, Codeine, Fructose, Sulfa antibiotics, Meperidine, Elemental sulfur, and Propoxyphene    Review of Systems   Review of Systems  Genitourinary:  Positive for hematuria.  All other systems reviewed and are negative.   Physical Exam Updated Vital Signs BP (!) 173/86   Pulse (!) 59   Temp 98 F (36.7 C) (Oral)   Resp 16   LMP  (LMP Unknown)   SpO2 96%  Physical Exam Vitals and nursing note reviewed.  Constitutional:      General: She is not in acute distress.    Appearance: Normal appearance.  HENT:     Head: Normocephalic and atraumatic.     Mouth/Throat:     Mouth: Mucous membranes are moist.  Eyes:     Conjunctiva/sclera: Conjunctivae normal.     Pupils: Pupils are equal, round, and reactive to light.  Cardiovascular:     Rate and Rhythm: Normal rate and regular rhythm.     Pulses: Normal pulses.     Heart sounds: Normal heart sounds.  Pulmonary:     Effort: Pulmonary effort is normal.     Breath sounds: Normal breath sounds.  Abdominal:     Palpations: Abdomen is soft.     Tenderness: There is no abdominal tenderness. There is no right CVA tenderness or left CVA tenderness.  Musculoskeletal:        General: Normal range of motion.  Skin:    General: Skin is warm and dry.     Findings: No rash.  Neurological:     General: No focal deficit present.     Mental Status: She is alert.     Sensory: No sensory deficit.     Motor: No weakness.  Psychiatric:        Mood and Affect: Mood normal.        Behavior: Behavior normal.    ED  Results / Procedures / Treatments   Labs (all labs ordered are listed, but only abnormal results are displayed) Labs Reviewed  URINALYSIS, ROUTINE W REFLEX MICROSCOPIC - Abnormal; Notable for the following components:      Result Value   Color, Urine BROWN (*)    APPearance HAZY (*)    Hgb urine dipstick LARGE (*)    Protein, ur TRACE (*)    Leukocytes,Ua TRACE (*)    All other components within normal limits  BASIC METABOLIC PANEL - Abnormal; Notable for the following components:   Creatinine, Ser 1.08 (*)    GFR, Estimated 52 (*)  All other components within normal limits  CBC WITH DIFFERENTIAL/PLATELET - Abnormal; Notable for the following components:   WBC 11.5 (*)    Neutro Abs 8.1 (*)    Abs Immature Granulocytes 0.09 (*)    All other components within normal limits  URINE CULTURE  CK    EKG None  Radiology CT Renal Stone Study Result Date: 03/02/2023 CLINICAL DATA:  Abdominal/flank pain, stone suspected, hematuria for 2 days EXAM: CT ABDOMEN AND PELVIS WITHOUT CONTRAST TECHNIQUE: Multidetector CT imaging of the abdomen and pelvis was performed following the standard protocol without IV contrast. RADIATION DOSE REDUCTION: This exam was performed according to the departmental dose-optimization program which includes automated exposure control, adjustment of the mA and/or kV according to patient size and/or use of iterative reconstruction technique. COMPARISON:  CT 09/17/2020 FINDINGS: Lower chest: No acute abnormality. Hepatobiliary: Unremarkable liver. Normal gallbladder. No biliary dilation. Pancreas: Unremarkable. Spleen: Unremarkable. Adrenals/Urinary Tract: Normal adrenal glands. No urinary calculi or hydronephrosis. Bladder is unremarkable. Stomach/Bowel: Normal caliber large and small bowel. No bowel wall thickening. The appendix is not visualized.Stomach is within normal limits. Vascular/Lymphatic: Aortic atherosclerosis. No enlarged abdominal or pelvic lymph nodes.  Reproductive: Unremarkable. Other: No free intraperitoneal fluid or air. Musculoskeletal: No acute fracture. IMPRESSION: 1. No acute abnormality in the abdomen or pelvis. No urinary calculi or hydronephrosis. Aortic Atherosclerosis (ICD10-I70.0). Electronically Signed   By: Minerva Fester M.D.   On: 03/02/2023 19:01    Procedures Procedures: not indicated.   Medications Ordered in ED Medications  nitrofurantoin (macrocrystal-monohydrate) (MACROBID) capsule 100 mg (100 mg Oral Given 03/02/23 1948)    ED Course/ Medical Decision Making/ A&P                                 Medical Decision Making Amount and/or Complexity of Data Reviewed Labs: ordered. Radiology: ordered.  Risk Prescription drug management.   This patient presents to the ED for concern of hematuria, this involves an extensive number of treatment options, and is a complaint that carries with it a high risk of complications and morbidity.   Differential diagnosis includes: UTI, pyelonephritis, nephrolithiasis, urolithiasis, rhabdomyolysis, kidney injury, etc.    Comorbidities  See HPI above   Additional History  Additional history obtained from prior records.   Lab Tests  I ordered and personally interpreted labs.  The pertinent results include:   Urine showed trace leukocytes, trace protein, large hemoglobin with greater than 50 are BC/hpf, brown in appearance.  Urine culture pending. Elevated white count of 11.5 Creatinine slightly elevated at 1.8, GFR of 52, otherwise BMP is unremarkable Negative CK   Imaging Studies  I ordered imaging studies including CT renal stone study  I independently visualized and interpreted imaging which showed: No acute abnormality in the abdomen or pelvis.  No renal calculi or hydronephrosis. I agree with the radiologist interpretation   Problem List / ED Course / Critical Interventions / Medication Management  Hematuria I ordered medications including: Macrobid  for first dose antibiotic for UTI - medication given prior to discharge. I have reviewed the patients home medicines and have made adjustments as needed   Social Determinants of Health  Tobacco use   Test / Admission - Considered  Discussed findings with patient.  All questions were answered. She is hemodynamically stable and safe for discharge home. Prescription for Macrobid sent to the pharmacy.  Advise close follow-up with primary care.   Return precautions given.  Final Clinical Impression(s) / ED Diagnoses Final diagnoses:  Hematuria, unspecified type    Rx / DC Orders ED Discharge Orders          Ordered    nitrofurantoin, macrocrystal-monohydrate, (MACROBID) 100 MG capsule  2 times daily        03/02/23 1937              Maxwell Marion, PA-C 03/03/23 0113    Arby Barrette, MD 03/07/23 1311

## 2023-03-04 LAB — URINE CULTURE

## 2023-04-04 ENCOUNTER — Ambulatory Visit: Payer: Medicare PPO | Attending: Internal Medicine | Admitting: Internal Medicine

## 2023-04-04 ENCOUNTER — Encounter: Payer: Self-pay | Admitting: Internal Medicine

## 2023-04-04 VITALS — BP 156/90 | HR 74 | Ht 60.0 in | Wt 144.0 lb

## 2023-04-04 DIAGNOSIS — I480 Paroxysmal atrial fibrillation: Secondary | ICD-10-CM

## 2023-04-04 NOTE — Patient Instructions (Signed)

## 2023-04-04 NOTE — Progress Notes (Signed)
HPI Ms. Cuffie returns for ongoing evaluation of atrial fib. She is a pleasant 83 yo woman with HCM, LBBB, and recent decline in her LV function from 65% to 50%. She had been on amiodarone but this medication was stopped. She  developed atrial fib with a RVR. She notes that her bp has been well controlled at home. She has not had syncope.    Allergies  Allergen Reactions   Cefaclor Itching    Other reaction(s): Flushing (ALLERGY/intolerance)   Codeine Itching and Nausea And Vomiting   Fructose Swelling and Nausea And Vomiting    Swelling and GI upset, Fructose Granules  Fructose Granules   Sulfa Antibiotics Swelling   Meperidine Nausea And Vomiting and Itching   Elemental Sulfur Swelling   Propoxyphene Nausea And Vomiting     Current Outpatient Medications  Medication Sig Dispense Refill   acetaminophen (TYLENOL) 500 MG tablet Take 500 mg by mouth every 4 (four) hours as needed.     albuterol (PROVENTIL) (2.5 MG/3ML) 0.083% nebulizer solution Take 3 mLs (2.5 mg total) by nebulization every 6 (six) hours as needed for wheezing or shortness of breath. 75 mL 0   albuterol (VENTOLIN HFA) 108 (90 Base) MCG/ACT inhaler USE 1-2 PUFFS EVERY 4-6 HOURS AS NEEDED FOR COUGH OR WHEEZE. 8.5 each 1   augmented betamethasone dipropionate (DIPROLENE-AF) 0.05 % cream Apply 0.05 application topically daily.     benzonatate (TESSALON) 100 MG capsule Take 100 mg by mouth daily as needed for cough.     buPROPion (WELLBUTRIN XL) 150 MG 24 hr tablet Take 150 mg by mouth daily.     Cholecalciferol (VITAMIN D) 50 MCG (2000 UT) CAPS Take 2,000 Units by mouth daily.     clobetasol cream (TEMOVATE) 0.05 % Apply 1 application. topically daily.     clotrimazole-betamethasone (LOTRISONE) cream Apply 1 application. topically 2 (two) times daily.     diclofenac Sodium (VOLTAREN) 1 % GEL Apply 2 g topically 4 (four) times daily. Both knees     dicyclomine (BENTYL) 20 MG tablet Take 20 mg by mouth daily as needed  for spasms.     ELIQUIS 5 MG TABS tablet TAKE 1 TABLET BY MOUTH TWICE A DAY 60 tablet 5   fexofenadine (ALLEGRA) 180 MG tablet Take 180 mg by mouth daily.      fluocinonide cream (LIDEX) 0.05 % Apply 1 application topically 2 (two) times daily.      Fluticasone Furoate (ARNUITY ELLIPTA) 100 MCG/ACT AEPB Inhale 1 puff into the lungs daily. 30 each 1   lansoprazole (PREVACID) 30 MG capsule Take 1 capsule (30 mg total) by mouth 2 (two) times daily before a meal. 30 capsule 5   mometasone (NASONEX) 50 MCG/ACT nasal spray PLACE 2 SPRAYS INTO THE NOSE DAILY. 34 each 2   montelukast (SINGULAIR) 10 MG tablet Take 1 tablet (10 mg total) by mouth at bedtime. 90 tablet 1   olopatadine (PATANOL) 0.1 % ophthalmic solution Place 1 drop into both eyes 2 (two) times daily as needed. 5 mL 5   Polyethylene Glycol 3350 (PEG 3350) POWD Take 17 g by mouth daily.      potassium chloride (KLOR-CON) 10 MEQ tablet Take 10 mEq by mouth 2 (two) times daily.     rosuvastatin (CRESTOR) 10 MG tablet Take 10 mg by mouth daily.     tiZANidine (ZANAFLEX) 4 MG tablet Take 4 mg by mouth at bedtime as needed for muscle spasms.     triamcinolone  cream (KENALOG) 0.1 % Apply 1 application. topically 2 (two) times daily.     verapamil (CALAN-SR) 120 MG CR tablet TAKE 1 TABLET BY MOUTH EVERY DAY 90 tablet 3   hydrochlorothiazide (HYDRODIURIL) 25 MG tablet Take 1 tablet (25 mg total) by mouth daily. 30 tablet 0   No current facility-administered medications for this visit.     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   Hypokalemia 09/27/2020   Infection due to Staphylococcus epidermidis 09/27/2020   Irritable bowel syndrome 12/16/2013   LBBB (left bundle branch block) 04/30/2018   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   Major depressive disorder, recurrent, unspecified (HCC) 12/16/2013   Mitral regurgitation 04/30/2018   Moderate persistent asthma 09/28/2015   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   Past Surgical History:  Procedure Laterality Date   ABDOMINAL ADHESION SURGERY     ADENOIDECTOMY     BREAST SURGERY     carpel tunnel     CESAREAN SECTION     KNEE SURGERY     LEG SURGERY     bone graft   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/2022   TONSILLECTOMY     VEIN SURGERY     Right Leg     Family History  Problem Relation Age of Onset   Congestive Heart Failure Mother    Hypertension Mother    Hyperlipidemia Sister    Dementia Sister    Alzheimer's disease Sister  Stroke Maternal Grandmother    Breast cancer Sister    Hyperlipidemia Sister    Allergic rhinitis Neg Hx    Angioedema Neg Hx    Asthma Neg Hx    Eczema Neg Hx    Immunodeficiency Neg Hx    Urticaria Neg Hx    Atopy Neg Hx      Social History   Socioeconomic History   Marital status: Married    Spouse name: Not on file   Number of children: Not on file   Years of education: Not on file   Highest education level: Not on file  Occupational History   Not on file  Tobacco Use   Smoking status: Former    Types: Cigarettes    Passive exposure: Past   Smokeless tobacco: Never  Vaping Use   Vaping status: Never Used  Substance and Sexual Activity   Alcohol use: No    Alcohol/week: 0.0 standard drinks of alcohol   Drug use: No   Sexual activity: Never  Other Topics Concern   Not on file  Social History Narrative   Not on file   Social Drivers of Health   Financial Resource Strain: Not on file  Food Insecurity: Low Risk  (08/30/2022)   [facility]   Hunger Vital Sign    Worried About Running Out of Food in the Last Year: Never true    Ran Out of Food in the Last Year: Never true  Transportation Needs: Not on file (08/30/2022)  Physical Activity: Not on file  Stress: Not on file  Social Connections: Not on file  Intimate Partner Violence: Not on file     BP  (!) 156/90   Pulse 74   Ht 5' (1.524 m)   Wt 144 lb (65.3 kg)   LMP  (LMP Unknown)   SpO2 98%   BMI 28.12 kg/m   Physical Exam:  Well appearing NAD HEENT: Unremarkable Neck:  No JVD, no thyromegally Lymphatics:  No adenopathy Back:  No CVA tenderness Lungs:  Clear with no wheezes HEART:  Regular rate rhythm, 3/6 systolic murmur, no rubs, no clicks Abd:  soft, positive bowel sounds, no organomegally, no rebound, no guarding Ext:  2 plus pulses, no edema, no cyanosis, no clubbing Skin:  No rashes no nodules Neuro:  CN II through XII intact, motor grossly intact   DEVICE  Normal device function.  See PaceArt for details.   Assess/Plan:  HCM -she is minimally symptomatic. No change in meds. Atrial fib - she is minimally symptomatic. We will follow. She could not tolerate amiodarone. HTN - her bp is controlled at home based on the log she brings in today. We will follow. Coags - she has not had any bleeding on eliquis. Continue.   Sharlot Gowda Breven Guidroz,MD

## 2023-05-15 ENCOUNTER — Other Ambulatory Visit (HOSPITAL_BASED_OUTPATIENT_CLINIC_OR_DEPARTMENT_OTHER): Payer: Self-pay

## 2023-05-15 MED ORDER — BOOSTRIX 5-2.5-18.5 LF-MCG/0.5 IM SUSY
0.5000 mL | PREFILLED_SYRINGE | Freq: Once | INTRAMUSCULAR | 0 refills | Status: AC
  Filled 2023-05-15: qty 0.5, 1d supply, fill #0

## 2023-06-19 ENCOUNTER — Telehealth: Payer: Self-pay | Admitting: Internal Medicine

## 2023-06-19 DIAGNOSIS — I480 Paroxysmal atrial fibrillation: Secondary | ICD-10-CM

## 2023-06-19 MED ORDER — APIXABAN 5 MG PO TABS: 5.0000 mg | ORAL_TABLET | Freq: Two times a day (BID) | ORAL | 1 refills | Status: DC

## 2023-06-19 NOTE — Telephone Encounter (Signed)
*  STAT* If patient is at the pharmacy, call can be transferred to refill team.   1. Which medications need to be refilled? (please list name of each medication and dose if known) ELIQUIS 5 MG TABS tablet    2. Would you like to learn more about the convenience, safety, & potential cost savings by using the [facility]? No  3. Are you open to using the [facility] (Type [facility].  ). No   4. Which pharmacy/location (including street and city if local pharmacy) is medication to be sent to? [id number] [place], [place] - [address]    5. Do they need a 30 day or 90 day supply? 90 day

## 2023-06-19 NOTE — Telephone Encounter (Signed)
 Prescription refill request for Eliquis received. Indication: Afib  Last office visit: 04/04/23 Laurie Bowman)  Scr: 1.06 (05/02/23)  Age: 83 Weight: 65.3kg  Appropriate dose. Refill sent.

## 2023-06-26 ENCOUNTER — Ambulatory Visit: Payer: Medicare PPO | Admitting: Allergy & Immunology

## 2023-06-26 ENCOUNTER — Encounter: Payer: Self-pay | Admitting: Allergy & Immunology

## 2023-06-26 VITALS — BP 148/92 | HR 80 | Temp 97.6°F | Ht 60.0 in | Wt 145.0 lb

## 2023-06-26 DIAGNOSIS — J0100 Acute maxillary sinusitis, unspecified: Secondary | ICD-10-CM

## 2023-06-26 DIAGNOSIS — J4540 Moderate persistent asthma, uncomplicated: Secondary | ICD-10-CM

## 2023-06-26 DIAGNOSIS — J3089 Other allergic rhinitis: Secondary | ICD-10-CM

## 2023-06-26 DIAGNOSIS — K219 Gastro-esophageal reflux disease without esophagitis: Secondary | ICD-10-CM

## 2023-06-26 MED ORDER — MOMETASONE FUROATE 50 MCG/ACT NA SUSP: 2.0000 | Freq: Every day | NASAL | 2 refills | Status: AC

## 2023-06-26 MED ORDER — FLUTICASONE PROPIONATE HFA 110 MCG/ACT IN AERO: INHALATION_SPRAY | RESPIRATORY_TRACT | 3 refills | Status: AC

## 2023-06-26 MED ORDER — DOXYCYCLINE HYCLATE 100 MG PO TABS: 100.0000 mg | ORAL_TABLET | Freq: Two times a day (BID) | ORAL | 0 refills | Status: AC
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
## 2023-06-26 NOTE — Patient Instructions (Addendum)
 1. Moderate persistent asthma, uncomplicated - Lung testing looks good today.   - Daily controller medication(s): Singulair 10mg  daily  - Prior to physical activity: albuterol 2 puffs 10-15 minutes before physical activity. - Rescue medications: albuterol 4 puffs every 4-6 hours as needed or albuterol nebulizer one vial every 4-6 hours as needed - Changes during respiratory infections or worsening symptoms: Add on Flovent to 4 puffs twice daily for TWO WEEKS. - Asthma control goals:  * Full participation in all desired activities (may need albuterol before activity) * Albuterol use two time or less a week on average (not counting use with activity) * Cough interfering with sleep two time or less a month * Oral steroids no more than once a year * No hospitalizations  2. Allergic rhinitis with overlying sinusitis - Add on doxycycline 100 mg twice daily for 10 days. - Continue with Nasonex one spray per nostril twice daily for another couple of weeks. - Continue with the Singulair (montelukast) 10mg  daily.   3. Gastroesophageal reflux disease - Continue with your Prevacid every morning and dietary restrictions.   4. Return in about 6 months (around 12/26/2023). You can have the follow up appointment with Dr. Dellis Anes or a Nurse Practicioner (our Nurse Practitioners are excellent and always have Physician oversight!).    Please inform us of any Emergency Department visits, hospitalizations, or changes in symptoms. Call us before going to the ED for breathing or allergy symptoms since we might be able to fit you in for a sick visit. Feel free to contact us anytime with any questions, problems, or concerns.  It was a pleasure to see you again today!   Websites that have reliable patient information: 1. American Academy of Asthma, Allergy, and Immunology: www.aaaai.org 2. Food Allergy Research and Education (FARE): foodallergy.org 3. Mothers of Asthmatics:  http://www.asthmacommunitynetwork.org 4. American College of Allergy, Asthma, and Immunology: www.acaai.org      "Like" [place] on Facebook and Instagram for our latest updates!      A healthy democracy works best when [employer] participate! Make sure you are registered to vote! If you have moved or changed any of your contact information, you will need to get this updated before voting! Scan the QR codes below to learn more!

## 2023-06-27 NOTE — Addendum Note (Signed)
 Addended by: Kellie Simmering, Jawaan Adachi on: 06/27/2023 05:23 PM   Modules accepted: Orders

## 2023-08-06 ENCOUNTER — Ambulatory Visit (INDEPENDENT_AMBULATORY_CARE_PROVIDER_SITE_OTHER): Payer: Medicare PPO

## 2023-08-07 ENCOUNTER — Encounter (INDEPENDENT_AMBULATORY_CARE_PROVIDER_SITE_OTHER): Payer: Self-pay | Admitting: Physician Assistant

## 2023-08-07 ENCOUNTER — Ambulatory Visit (INDEPENDENT_AMBULATORY_CARE_PROVIDER_SITE_OTHER): Payer: Medicare PPO | Admitting: Physician Assistant

## 2023-08-07 VITALS — BP 148/85 | HR 80

## 2023-08-07 DIAGNOSIS — Z09 Encounter for follow-up examination after completed treatment for conditions other than malignant neoplasm: Secondary | ICD-10-CM

## 2023-08-07 DIAGNOSIS — Z8669 Personal history of other diseases of the nervous system and sense organs: Secondary | ICD-10-CM

## 2023-08-07 DIAGNOSIS — H6123 Impacted cerumen, bilateral: Secondary | ICD-10-CM

## 2023-08-07 NOTE — Progress Notes (Signed)
 Dear Dr. Reyne Cave, Here is my assessment for our mutual patient, Laurie Bowman. Thank you for allowing me the opportunity to care for your patient. Please do not hesitate to contact me should you have any other questions. Sincerely, Belma Boxer PA-C  Otolaryngology Clinic Note Referring provider: Dr. Reyne Cave HPI:  Laurie Bowman is a 83 y.o. female kindly referred by Dr. Reyne Cave   The patient is an 83 year old female seen in our office for follow-up evaluation of cerumen impaction.  She was last seen in the office on 02/06/2023.  Below is a recap of that encounter.  Laurie Bowman is a 83 y.o. female kindly referred by Dr. Reyne Cave for follow-up evaluation of impacted cerumen.  Below is a recap of her previous office visit   Laurie Bowman is a 83 y.o. female kindly referred by Dr. Ariel Begun for evaluation of cerumen impaction.  The patient notes that she had discomfort in her left ear.  She followed up with her primary care provider who informed her that she had rather significant cerumen impaction in both the ears.  At the time she was having a sinus infection was placed on doxycycline  which she has now finished.  She denies any of the infectious signs or symptoms that she had at this time including sinus pressure.  Of the sinus related symptoms that she was having including sinus pressure.  She was referred to our office for evaluation of the impacted cerumen.  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  She denies any history of head or neck cancer, no history head or neck surgeries other than TMJ surgery.  She has a history of seasonal allergies and asthma and is currently taking albuterol , Flonase , Nasonex , Singulair  for the symptoms.       At her last office visit I was able to remove the cerumen of the left ear, the right ear was too impacted, I started her on Debrox.  She notes she did use it for approximately 5 days.  Since that time she has denied any  significant changes to her symptoms, she denies any significant pain, drainage, fever, swelling, or hearing changes.  She notes her right ear does feel stopped up  Update 08/07/2023  Since her last office visit she notes she has been doing well.  She denies any changes to her hearing, she notes she did have some soreness in the right ear after cerumen removal.  She denies any drainage.  She has no other acute concerns or complaints at today's office visit.   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 Possibly aggravated by Tamoxifen   Fibrocystic breast changes 07/08/2011   Fracture, Colles, left, closed 05/03/2021   GERD (gastroesophageal reflux disease) 02/06/2016   Heart murmur, systolic 12/16/2013   Overview:  Overview:  Mild MR/TR, echo 2007 without change since 2001   Hyperlipidemia LDL goal <70 12/16/2013   Hyperlipidemia, unspecified 12/16/2013    Hypertension    Hypertensive heart disease 04/30/2018   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   Hypokalemia 09/27/2020   Infection due to Staphylococcus epidermidis 09/27/2020   Irritable bowel syndrome 12/16/2013   LBBB (left bundle branch block) 04/30/2018   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   Major depressive disorder, recurrent, unspecified (HCC) 12/16/2013   Mitral regurgitation 04/30/2018   Moderate persistent asthma 09/28/2015   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 06/21/2015   Other chest pain 09/27/2020   Other specified disorders of eyelid 11/09/2015   Pain in unspecified knee 12/16/2013   Peripheral visual field defect of both eyes 04/21/2015   PVC's (premature ventricular contractions) 04/30/2018   Rotator cuff tendonitis 12/16/2013   Undiagnosed cardiac murmurs 12/16/2013   Overview:  Mild MR/TR, echo 2007 without change since 2001   Urticaria    Ventricular premature depolarization 12/16/2013   Wound dehiscence 05/08/2015     Past Surgical History:  Procedure Laterality Date   ABDOMINAL ADHESION SURGERY     ADENOIDECTOMY     BREAST SURGERY     carpel tunnel     CESAREAN SECTION     KNEE SURGERY     LEG SURGERY     bone graft   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/2022   TONSILLECTOMY     VEIN SURGERY     Right Leg    Family History  Problem Relation Age of Onset   Congestive Heart Failure Mother    Hypertension Mother    Hyperlipidemia Sister    Dementia Sister    Alzheimer's disease Sister    Stroke Maternal Grandmother    Breast cancer Sister    Hyperlipidemia Sister    Allergic rhinitis Neg Hx    Angioedema Neg Hx    Asthma Neg Hx    Eczema Neg Hx    Immunodeficiency Neg Hx    Urticaria Neg Hx    Atopy Neg Hx      Social Connections: Not on file      Current Outpatient Medications:    acetaminophen  (TYLENOL ) 500 MG tablet, Take 500 mg by mouth every 4 (four)  hours as needed., Disp: , Rfl:    albuterol  (PROVENTIL ) (2.5 MG/3ML) 0.083% nebulizer solution, Take 3 mLs (2.5 mg total) by nebulization every 6 (six) hours as needed for wheezing or shortness of breath., Disp: 75 mL, Rfl: 0   albuterol  (VENTOLIN  HFA) 108 (90 Base) MCG/ACT inhaler, USE 1-2 PUFFS EVERY 4-6 HOURS AS NEEDED FOR COUGH OR WHEEZE., Disp: 8.5 each, Rfl: 1   apixaban  (ELIQUIS ) 5 MG TABS tablet, Take 1 tablet (5 mg total) by mouth 2 (two) times daily., Disp: 180 tablet, Rfl: 1   augmented betamethasone dipropionate (DIPROLENE-AF) 0.05 % cream, Apply 0.05 application topically daily., Disp: , Rfl:    benzonatate  (TESSALON ) 100 MG capsule, Take 100 mg by mouth daily as needed for cough., Disp: ,  Rfl:    buPROPion  (WELLBUTRIN  XL) 150 MG 24 hr tablet, Take 150 mg by mouth daily., Disp: , Rfl:    Cholecalciferol  (VITAMIN D) 50 MCG (2000 UT) CAPS, Take 2,000 Units by mouth daily., Disp: , Rfl:    clobetasol cream (TEMOVATE) 0.05 %, Apply 1 application. topically daily., Disp: , Rfl:    clotrimazole-betamethasone (LOTRISONE) cream, Apply 1 application. topically 2 (two) times daily., Disp: , Rfl:    diclofenac Sodium (VOLTAREN) 1 % GEL, Apply 2 g topically 4 (four) times daily. Both knees, Disp: , Rfl:    dicyclomine  (BENTYL ) 20 MG tablet, Take 20 mg by mouth daily as needed for spasms., Disp: , Rfl:    fexofenadine (ALLEGRA) 180 MG tablet, Take 180 mg by mouth daily. , Disp: , Rfl:    fluocinonide cream (LIDEX) 0.05 %, Apply 1 application topically 2 (two) times daily. , Disp: , Rfl:    fluticasone  (FLOVENT  HFA) 110 MCG/ACT inhaler, Use two puffs twice daily for one to two weeks during flares, Disp: 1 each, Rfl: 3   Fluticasone  Furoate (ARNUITY ELLIPTA ) 100 MCG/ACT AEPB, Inhale 1 puff into the lungs daily., Disp: 30 each, Rfl: 1   hydrochlorothiazide  (HYDRODIURIL ) 25 MG tablet, Take 1 tablet (25 mg total) by mouth daily., Disp: 30 tablet, Rfl: 0   lansoprazole  (PREVACID ) 30 MG capsule, Take 1  capsule (30 mg total) by mouth 2 (two) times daily before a meal., Disp: 30 capsule, Rfl: 5   mometasone  (NASONEX ) 50 MCG/ACT nasal spray, Place 2 sprays into the nose daily., Disp: 34 each, Rfl: 2   montelukast  (SINGULAIR ) 10 MG tablet, Take 1 tablet (10 mg total) by mouth at bedtime., Disp: 90 tablet, Rfl: 1   olopatadine  (PATANOL) 0.1 % ophthalmic solution, Place 1 drop into both eyes 2 (two) times daily as needed., Disp: 5 mL, Rfl: 5   Polyethylene Glycol 3350  (PEG 3350 ) POWD, Take 17 g by mouth daily. , Disp: , Rfl:    potassium chloride  (KLOR-CON ) 10 MEQ tablet, Take 10 mEq by mouth 2 (two) times daily., Disp: , Rfl:    rosuvastatin  (CRESTOR ) 10 MG tablet, Take 10 mg by mouth daily., Disp: , Rfl:    tiZANidine (ZANAFLEX) 4 MG tablet, Take 4 mg by mouth at bedtime as needed for muscle spasms., Disp: , Rfl:    triamcinolone cream (KENALOG) 0.1 %, Apply 1 application. topically 2 (two) times daily., Disp: , Rfl:    verapamil  (CALAN -SR) 120 MG CR tablet, TAKE 1 TABLET BY MOUTH EVERY DAY, Disp: 90 tablet, Rfl: 3   Physical Exam:   LMP  (LMP Unknown)   Pertinent Findings  CN II-XII intact Bilateral EAC clear and TM intact with well pneumatized middle ear spaces Anterior rhinoscopy: Septum midline; bilateral inferior turbinates with hypertrophy No lesions of oral cavity/oropharynx; dentition within normal limits No obviously palpable neck masses/lymphadenopathy/thyromegaly No respiratory distress or stridor  Seprately Identifiable Procedures:  None  Impression & Plans:  Keyuna Cuthrell is a 83 y.o. female with the following   Cerumen impaction-  This is an 83 year old female following up today status post cerumen removal.  She is doing well today, she has no reaccumulation of cerumen on exam today.  She has no other acute concerns.  The patient would like to follow-up in 6 months as she does not want to have any cerumen impaction in the future.  I do find this reasonable I will see her back  in the office in 6 months for repeat evaluation.   -  f/u 6 months in office    Thank you for allowing me the opportunity to care for your patient. Please do not hesitate to contact me should you have any other questions.  Sincerely, Belma Boxer PA-C [facility] Phone: [phone] Fax: [phone]  08/07/2023, 9:59 AM

## 2023-08-17 ENCOUNTER — Encounter (HOSPITAL_COMMUNITY): Payer: Self-pay | Admitting: Emergency Medicine

## 2023-08-17 ENCOUNTER — Other Ambulatory Visit: Payer: Self-pay

## 2023-08-17 ENCOUNTER — Emergency Department (HOSPITAL_COMMUNITY)

## 2023-08-17 ENCOUNTER — Inpatient Hospital Stay (HOSPITAL_COMMUNITY)
Admission: EM | Admit: 2023-08-17 | Discharge: 2023-08-28 | DRG: 656 | Disposition: A | Discharge status: Skilled Nursing Facility | Attending: Obstetrics and Gynecology | Admitting: Obstetrics and Gynecology

## 2023-08-17 DIAGNOSIS — R1084 Generalized abdominal pain: Secondary | ICD-10-CM

## 2023-08-17 DIAGNOSIS — R310 Gross hematuria: Secondary | ICD-10-CM | POA: Diagnosis not present

## 2023-08-17 DIAGNOSIS — R7989 Other specified abnormal findings of blood chemistry: Secondary | ICD-10-CM | POA: Insufficient documentation

## 2023-08-17 DIAGNOSIS — C679 Malignant neoplasm of bladder, unspecified: Secondary | ICD-10-CM | POA: Diagnosis not present

## 2023-08-17 DIAGNOSIS — I129 Hypertensive chronic kidney disease with stage 1 through stage 4 chronic kidney disease, or unspecified chronic kidney disease: Secondary | ICD-10-CM | POA: Diagnosis present

## 2023-08-17 DIAGNOSIS — Z888 Allergy status to other drugs, medicaments and biological substances status: Secondary | ICD-10-CM

## 2023-08-17 DIAGNOSIS — Z8249 Family history of ischemic heart disease and other diseases of the circulatory system: Secondary | ICD-10-CM

## 2023-08-17 DIAGNOSIS — Z860100 Personal history of colon polyps, unspecified: Secondary | ICD-10-CM

## 2023-08-17 DIAGNOSIS — K589 Irritable bowel syndrome without diarrhea: Secondary | ICD-10-CM | POA: Diagnosis present

## 2023-08-17 DIAGNOSIS — I4891 Unspecified atrial fibrillation: Secondary | ICD-10-CM | POA: Insufficient documentation

## 2023-08-17 DIAGNOSIS — Z7901 Long term (current) use of anticoagulants: Secondary | ICD-10-CM

## 2023-08-17 DIAGNOSIS — N3000 Acute cystitis without hematuria: Secondary | ICD-10-CM | POA: Diagnosis not present

## 2023-08-17 DIAGNOSIS — Z882 Allergy status to sulfonamides status: Secondary | ICD-10-CM

## 2023-08-17 DIAGNOSIS — R5381 Other malaise: Secondary | ICD-10-CM | POA: Diagnosis present

## 2023-08-17 DIAGNOSIS — T796XXA Traumatic ischemia of muscle, initial encounter: Secondary | ICD-10-CM

## 2023-08-17 DIAGNOSIS — R531 Weakness: Principal | ICD-10-CM

## 2023-08-17 DIAGNOSIS — T83192A Other mechanical complication of urinary stent, initial encounter: Secondary | ICD-10-CM | POA: Diagnosis not present

## 2023-08-17 DIAGNOSIS — I10 Essential (primary) hypertension: Secondary | ICD-10-CM | POA: Diagnosis present

## 2023-08-17 DIAGNOSIS — I422 Other hypertrophic cardiomyopathy: Secondary | ICD-10-CM | POA: Diagnosis present

## 2023-08-17 DIAGNOSIS — Z87442 Personal history of urinary calculi: Secondary | ICD-10-CM

## 2023-08-17 DIAGNOSIS — M25522 Pain in left elbow: Secondary | ICD-10-CM | POA: Diagnosis present

## 2023-08-17 DIAGNOSIS — Z820 Family history of epilepsy and other diseases of the nervous system: Secondary | ICD-10-CM

## 2023-08-17 DIAGNOSIS — G9341 Metabolic encephalopathy: Secondary | ICD-10-CM | POA: Diagnosis present

## 2023-08-17 DIAGNOSIS — Z96659 Presence of unspecified artificial knee joint: Secondary | ICD-10-CM | POA: Diagnosis present

## 2023-08-17 DIAGNOSIS — I21A1 Myocardial infarction type 2: Secondary | ICD-10-CM | POA: Diagnosis present

## 2023-08-17 DIAGNOSIS — M6282 Rhabdomyolysis: Secondary | ICD-10-CM | POA: Diagnosis present

## 2023-08-17 DIAGNOSIS — E869 Volume depletion, unspecified: Secondary | ICD-10-CM | POA: Diagnosis present

## 2023-08-17 DIAGNOSIS — R140 Abdominal distension (gaseous): Secondary | ICD-10-CM | POA: Diagnosis not present

## 2023-08-17 DIAGNOSIS — E860 Dehydration: Secondary | ICD-10-CM | POA: Diagnosis present

## 2023-08-17 DIAGNOSIS — E871 Hypo-osmolality and hyponatremia: Secondary | ICD-10-CM | POA: Diagnosis not present

## 2023-08-17 DIAGNOSIS — Y838 Other surgical procedures as the cause of abnormal reaction of the patient, or of later complication, without mention of misadventure at the time of the procedure: Secondary | ICD-10-CM | POA: Diagnosis present

## 2023-08-17 DIAGNOSIS — Z83438 Family history of other disorder of lipoprotein metabolism and other lipidemia: Secondary | ICD-10-CM

## 2023-08-17 DIAGNOSIS — I2510 Atherosclerotic heart disease of native coronary artery without angina pectoris: Secondary | ICD-10-CM | POA: Diagnosis present

## 2023-08-17 DIAGNOSIS — Z87891 Personal history of nicotine dependence: Secondary | ICD-10-CM

## 2023-08-17 DIAGNOSIS — N136 Pyonephrosis: Secondary | ICD-10-CM | POA: Diagnosis present

## 2023-08-17 DIAGNOSIS — Z823 Family history of stroke: Secondary | ICD-10-CM

## 2023-08-17 DIAGNOSIS — D630 Anemia in neoplastic disease: Secondary | ICD-10-CM | POA: Diagnosis present

## 2023-08-17 DIAGNOSIS — N179 Acute kidney failure, unspecified: Secondary | ICD-10-CM | POA: Diagnosis present

## 2023-08-17 DIAGNOSIS — Z818 Family history of other mental and behavioral disorders: Secondary | ICD-10-CM

## 2023-08-17 DIAGNOSIS — D696 Thrombocytopenia, unspecified: Secondary | ICD-10-CM | POA: Diagnosis not present

## 2023-08-17 DIAGNOSIS — I421 Obstructive hypertrophic cardiomyopathy: Secondary | ICD-10-CM | POA: Diagnosis present

## 2023-08-17 DIAGNOSIS — Z7951 Long term (current) use of inhaled steroids: Secondary | ICD-10-CM

## 2023-08-17 DIAGNOSIS — B9620 Unspecified Escherichia coli [E. coli] as the cause of diseases classified elsewhere: Secondary | ICD-10-CM | POA: Diagnosis present

## 2023-08-17 DIAGNOSIS — Z91018 Allergy to other foods: Secondary | ICD-10-CM

## 2023-08-17 DIAGNOSIS — Z885 Allergy status to narcotic agent status: Secondary | ICD-10-CM

## 2023-08-17 DIAGNOSIS — F32A Depression, unspecified: Secondary | ICD-10-CM | POA: Diagnosis present

## 2023-08-17 DIAGNOSIS — R112 Nausea with vomiting, unspecified: Secondary | ICD-10-CM | POA: Diagnosis present

## 2023-08-17 DIAGNOSIS — J3089 Other allergic rhinitis: Secondary | ICD-10-CM | POA: Diagnosis present

## 2023-08-17 DIAGNOSIS — Z881 Allergy status to other antibiotic agents status: Secondary | ICD-10-CM

## 2023-08-17 DIAGNOSIS — I482 Chronic atrial fibrillation, unspecified: Secondary | ICD-10-CM | POA: Diagnosis present

## 2023-08-17 DIAGNOSIS — N1831 Chronic kidney disease, stage 3a: Secondary | ICD-10-CM | POA: Diagnosis present

## 2023-08-17 DIAGNOSIS — E785 Hyperlipidemia, unspecified: Secondary | ICD-10-CM | POA: Diagnosis present

## 2023-08-17 DIAGNOSIS — N390 Urinary tract infection, site not specified: Secondary | ICD-10-CM | POA: Insufficient documentation

## 2023-08-17 DIAGNOSIS — R627 Adult failure to thrive: Secondary | ICD-10-CM | POA: Diagnosis present

## 2023-08-17 DIAGNOSIS — Z803 Family history of malignant neoplasm of breast: Secondary | ICD-10-CM

## 2023-08-17 DIAGNOSIS — I447 Left bundle-branch block, unspecified: Secondary | ICD-10-CM | POA: Diagnosis present

## 2023-08-17 DIAGNOSIS — Z79899 Other long term (current) drug therapy: Secondary | ICD-10-CM

## 2023-08-17 DIAGNOSIS — K219 Gastro-esophageal reflux disease without esophagitis: Secondary | ICD-10-CM | POA: Diagnosis present

## 2023-08-17 DIAGNOSIS — J4540 Moderate persistent asthma, uncomplicated: Secondary | ICD-10-CM | POA: Diagnosis present

## 2023-08-17 DIAGNOSIS — E876 Hypokalemia: Secondary | ICD-10-CM | POA: Diagnosis present

## 2023-08-17 LAB — URINALYSIS, ROUTINE W REFLEX MICROSCOPIC
Bilirubin Urine: NEGATIVE
Glucose, UA: NEGATIVE mg/dL
Ketones, ur: 5 mg/dL — AB
Nitrite: NEGATIVE
Protein, ur: 30 mg/dL — AB
Specific Gravity, Urine: 1.013 (ref 1.005–1.030)
WBC, UA: >50 WBC/hpf (ref 0–5)
pH: 6 (ref 5.0–8.0)

## 2023-08-17 LAB — LIPASE, BLOOD: Lipase: 21 U/L (ref 11–51)

## 2023-08-17 LAB — COMPREHENSIVE METABOLIC PANEL WITH GFR
ALT: 23 U/L (ref 0–44)
AST: 36 U/L (ref 15–41)
Albumin: 3.6 g/dL (ref 3.5–5.0)
Alkaline Phosphatase: 55 U/L (ref 38–126)
Anion gap: 13 (ref 5–15)
BUN: 24 mg/dL — ABNORMAL HIGH (ref 8–23)
CO2: 21 mmol/L — ABNORMAL LOW (ref 22–32)
Calcium: 9.5 mg/dL (ref 8.9–10.3)
Chloride: 95 mmol/L — ABNORMAL LOW (ref 98–111)
Creatinine, Ser: 1.46 mg/dL — ABNORMAL HIGH (ref 0.44–1.00)
GFR, Estimated: 36 mL/min — ABNORMAL LOW (ref >60)
Glucose, Bld: 94 mg/dL (ref 70–99)
Potassium: 3.6 mmol/L (ref 3.5–5.1)
Sodium: 129 mmol/L — ABNORMAL LOW (ref 135–145)
Total Bilirubin: 2.5 mg/dL — ABNORMAL HIGH (ref 0.0–1.2)
Total Protein: 7 g/dL (ref 6.5–8.1)

## 2023-08-17 LAB — CBC
HCT: 39.5 % (ref 36.0–46.0)
Hemoglobin: 13.1 g/dL (ref 12.0–15.0)
MCH: 31.6 pg (ref 26.0–34.0)
MCHC: 33.2 g/dL (ref 30.0–36.0)
MCV: 95.4 fL (ref 80.0–100.0)
Platelets: 178 10*3/uL (ref 150–400)
RBC: 4.14 MIL/uL (ref 3.87–5.11)
RDW: 13.2 % (ref 11.5–15.5)
WBC: 11.2 10*3/uL — ABNORMAL HIGH (ref 4.0–10.5)
nRBC: 0 % (ref 0.0–0.2)

## 2023-08-17 LAB — TROPONIN I (HIGH SENSITIVITY)
Troponin I (High Sensitivity): 35 ng/L — ABNORMAL HIGH (ref <18)
Troponin I (High Sensitivity): 147 ng/L (ref <18)
Troponin I (High Sensitivity): 287 ng/L (ref <18)

## 2023-08-17 LAB — CK: Total CK: 1770 U/L — ABNORMAL HIGH (ref 38–234)

## 2023-08-17 LAB — BRAIN NATRIURETIC PEPTIDE: B Natriuretic Peptide: 459.6 pg/mL — ABNORMAL HIGH (ref 0.0–100.0)

## 2023-08-17 MED ORDER — VERAPAMIL HCL ER 120 MG PO TBCR
120.0000 mg | EXTENDED_RELEASE_TABLET | Freq: Every day | ORAL | Status: DC
  Administered 2023-08-19 – 2023-08-28 (×10): 120 mg via ORAL
  Filled 2023-08-17 (×12): qty 1

## 2023-08-17 MED ORDER — LEVOFLOXACIN IN D5W 750 MG/150ML IV SOLN: 750.0000 mg | INTRAVENOUS | Status: DC

## 2023-08-17 MED ORDER — DILTIAZEM LOAD VIA INFUSION
10.0000 mg | Freq: Once | INTRAVENOUS | Status: AC
  Administered 2023-08-17: 10 mg via INTRAVENOUS
  Filled 2023-08-17: qty 10

## 2023-08-17 MED ORDER — LACTATED RINGERS IV BOLUS
1000.0000 mL | Freq: Once | INTRAVENOUS | Status: AC
  Administered 2023-08-17: 1000 mL via INTRAVENOUS

## 2023-08-17 MED ORDER — LACTATED RINGERS IV SOLN: INTRAVENOUS | Status: AC

## 2023-08-17 MED ORDER — LEVOFLOXACIN IN D5W 500 MG/100ML IV SOLN
500.0000 mg | Freq: Once | INTRAVENOUS | Status: AC
  Administered 2023-08-17: 500 mg via INTRAVENOUS
  Filled 2023-08-17: qty 100

## 2023-08-17 MED ORDER — AMIODARONE HCL IN DEXTROSE 360-4.14 MG/200ML-% IV SOLN
60.0000 mg/h | INTRAVENOUS | Status: DC
  Administered 2023-08-17: 60 mg/h via INTRAVENOUS
  Filled 2023-08-17: qty 200

## 2023-08-17 MED ORDER — BUPROPION HCL ER (XL) 150 MG PO TB24
150.0000 mg | ORAL_TABLET | Freq: Every day | ORAL | Status: DC
  Administered 2023-08-18 – 2023-08-28 (×10): 150 mg via ORAL
  Filled 2023-08-17 (×10): qty 1

## 2023-08-17 MED ORDER — DILTIAZEM HCL-DEXTROSE 125-5 MG/125ML-% IV SOLN (PREMIX)
5.0000 mg/h | INTRAVENOUS | Status: DC
  Administered 2023-08-17: 5 mg/h via INTRAVENOUS
  Filled 2023-08-17 (×2): qty 125

## 2023-08-17 MED ORDER — APIXABAN 5 MG PO TABS
5.0000 mg | ORAL_TABLET | Freq: Two times a day (BID) | ORAL | Status: DC
  Administered 2023-08-18 (×3): 5 mg via ORAL
  Filled 2023-08-17 (×3): qty 1

## 2023-08-17 MED ORDER — AMIODARONE HCL IN DEXTROSE 360-4.14 MG/200ML-% IV SOLN: 30.0000 mg/h | INTRAVENOUS | Status: DC

## 2023-08-17 MED ORDER — DILTIAZEM HCL 25 MG/5ML IV SOLN
INTRAVENOUS | Status: AC
  Filled 2023-08-17: qty 5

## 2023-08-17 MED ORDER — IOHEXOL 300 MG/ML  SOLN
100.0000 mL | Freq: Once | INTRAMUSCULAR | Status: AC | PRN
Start: 2023-08-17 — End: 2023-08-17
  Administered 2023-08-17: 100 mL via INTRAVENOUS

## 2023-08-17 NOTE — ED Notes (Signed)
 X-ray at bedside

## 2023-08-17 NOTE — ED Notes (Signed)
 Pads placed at this time, pt is now stating that she will agree to cardioversion

## 2023-08-17 NOTE — ED Notes (Signed)
 MD, Moses Arenas at bedside updating pt.

## 2023-08-17 NOTE — H&P (Signed)
 History and Physical    Laurie Bowman WUJ:[id number] DOB: 10-23-1940 DOA: 08/17/2023  Patient coming from: Home.  Chief Complaint: Fall and weakness.  HPI: Laurie Bowman is a 83 y.o. female with history of hypertrophic cardiomyopathy, paroxysmal atrial fibrillation, hyperlipidemia, recently diagnosed bladder cancer underwent BCG treatment last week at [facility] since then has been feeling weak tired fatigued poor appetite and also has been having increasing pain on micturition.  Had subjective feeling of fever chills.  Patient felt so weak today that she slid out of the bed did not hit her head or lose consciousness.  Did hit her left elbow.  Patient states recently her verapamil  dose was increased from 120 mg by her primary care physician.  ED Course: In the ER x-rays do not show any fracture.  Labs show sodium 129 creatinine 1.4 increased from baseline of around 1.1.  UA is concerning for UTI.  CT abdomen pelvis done shows features concerning perivesical stranding concerning for cystitis and also right-sided mild hydronephrosis with features concerning for either recently passed stone or ascending urinary tract infection.  Patient was started on Levaquin.  While in the ER patient went into A-fib with RVR and ER physician gave 1 dose of Cardizem  following which heart rate did not improve.  ER physician discussed with cardiologist on-call who advised starting patient on amiodarone  infusion but before amiodarone  could be started patient converted to sinus rhythm.  Patient's troponin was showing increasing trend.  Denies any chest pain.  Review of Systems: As per HPI, rest all negative.   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   Hypokalemia 09/27/2020   Infection due to Staphylococcus epidermidis 09/27/2020   Irritable bowel syndrome 12/16/2013   LBBB (left bundle branch block) 04/30/2018   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   Major depressive disorder, recurrent, unspecified (HCC) 12/16/2013   Mitral regurgitation 04/30/2018   Moderate persistent asthma 09/28/2015   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 06/21/2015   Other chest pain 09/27/2020   Other specified  disorders of eyelid 11/09/2015   Pain in unspecified knee 12/16/2013   Peripheral visual field defect of both eyes 04/21/2015   PVC's (premature ventricular contractions) 04/30/2018   Rotator cuff tendonitis 12/16/2013   Undiagnosed cardiac  murmurs 12/16/2013   Overview:  Mild MR/TR, echo 2007 without change since 2001   Urticaria    Ventricular premature depolarization 12/16/2013   Wound dehiscence 05/08/2015    Past Surgical History:  Procedure Laterality Date   ABDOMINAL ADHESION SURGERY     ADENOIDECTOMY     BREAST SURGERY     carpel tunnel     CESAREAN SECTION     KNEE SURGERY     LEG SURGERY     bone graft   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/2022   TONSILLECTOMY     VEIN SURGERY     Right Leg     reports that she has quit smoking. Her smoking use included cigarettes. She has been exposed to tobacco smoke. She has never used smokeless tobacco. She reports that she does not drink alcohol and does not use drugs.  Allergies  Allergen Reactions   Cefaclor Itching    Other reaction(s): Flushing (ALLERGY/intolerance)   Codeine Itching and Nausea And Vomiting   Fructose Swelling and Nausea And Vomiting    Swelling and GI upset, Fructose Granules  Fructose Granules   Sulfa Antibiotics Swelling   Meperidine Nausea And Vomiting and Itching   Elemental Sulfur Swelling   Propoxyphene Nausea And Vomiting    Family History  Problem Relation Age of Onset   Congestive Heart Failure Mother    Hypertension Mother    Hyperlipidemia Sister    Dementia Sister    Alzheimer's disease Sister    Stroke Maternal Grandmother    Breast cancer Sister    Hyperlipidemia Sister    Allergic rhinitis Neg Hx    Angioedema Neg Hx    Asthma Neg Hx    Eczema Neg Hx    Immunodeficiency Neg Hx    Urticaria Neg Hx    Atopy Neg Hx     Prior to Admission medications   Medication Sig Start Date End Date Taking? Authorizing Provider  acetaminophen  (TYLENOL ) 500 MG tablet Take 500 mg by mouth every 4 (four) hours as needed. 09/18/20   [provider]  albuterol  (PROVENTIL ) (2.5 MG/3ML) 0.083% nebulizer solution Take 3 mLs (2.5 mg total) by nebulization every 6 (six) hours as needed for wheezing or shortness of breath. 06/18/21    Rochester Chuck, MD  albuterol  (VENTOLIN  HFA) 108 [phone] Base) MCG/ACT inhaler USE 1-2 PUFFS EVERY 4-6 HOURS AS NEEDED FOR COUGH OR WHEEZE. 07/02/22   Rochester Chuck, MD  apixaban  (ELIQUIS ) 5 MG TABS tablet Take 1 tablet (5 mg total) by mouth 2 (two) times daily. 06/19/23   Tammie Fall, MD  augmented betamethasone dipropionate (DIPROLENE-AF) 0.05 % cream Apply 0.05 application topically daily.    [provider]  benzonatate  (TESSALON ) 100 MG capsule Take 100 mg by mouth daily as needed for cough. 07/02/18   [provider]  buPROPion  (WELLBUTRIN  XL) 150 MG 24 hr tablet Take 150 mg by mouth daily. 12/02/21   [provider]  Cholecalciferol  (VITAMIN D) 50 MCG (2000 UT) CAPS Take 2,000 Units by mouth daily.    [provider]  clobetasol cream (TEMOVATE) 0.05 % Apply 1 application. topically daily. 03/22/18   [provider]  clotrimazole-betamethasone (LOTRISONE) cream Apply 1 application. topically 2 (two) times daily. 03/16/19  [provider]  diclofenac Sodium (VOLTAREN) 1 % GEL Apply 2 g topically 4 (four) times daily. Both knees 04/16/19   [provider]  dicyclomine  (BENTYL ) 20 MG tablet Take 20 mg by mouth daily as needed for spasms. 04/14/18   [provider]  fexofenadine (ALLEGRA) 180 MG tablet Take 180 mg by mouth daily.     [provider]  fluocinonide cream (LIDEX) 0.05 % Apply 1 application topically 2 (two) times daily.     [provider]  fluticasone  (FLOVENT  HFA) 110 MCG/ACT inhaler Use two puffs twice daily for one to two weeks during flares 06/26/23   Rochester Chuck, MD  Fluticasone  Furoate (ARNUITY ELLIPTA ) 100 MCG/ACT AEPB Inhale 1 puff into the lungs daily. 02/27/23   Rochester Chuck, MD  hydrochlorothiazide  (HYDRODIURIL ) 25 MG tablet Take 1 tablet (25 mg total) by mouth daily. 09/04/21 05/17/22  Arrien, Curlee Doss, MD  lansoprazole  (PREVACID ) 30 MG capsule Take 1  capsule (30 mg total) by mouth 2 (two) times daily before a meal. 07/10/21   Rochester Chuck, MD  mometasone  (NASONEX ) 50 MCG/ACT nasal spray Place 2 sprays into the nose daily. 06/26/23   Rochester Chuck, MD  montelukast  (SINGULAIR ) 10 MG tablet Take 1 tablet (10 mg total) by mouth at bedtime. 01/09/23   Rochester Chuck, MD  olopatadine  (PATANOL) 0.1 % ophthalmic solution Place 1 drop into both eyes 2 (two) times daily as needed. 04/18/20   Rochester Chuck, MD  Polyethylene Glycol 3350  (PEG 3350 ) POWD Take 17 g by mouth daily.  06/24/16   [provider]  potassium chloride  (KLOR-CON ) 10 MEQ tablet Take 10 mEq by mouth 2 (two) times daily. 04/21/15   [provider]  rosuvastatin  (CRESTOR ) 10 MG tablet Take 10 mg by mouth daily. 07/13/21   [provider]  tiZANidine (ZANAFLEX) 4 MG tablet Take 4 mg by mouth at bedtime as needed for muscle spasms. 11/16/19   [provider]  triamcinolone cream (KENALOG) 0.1 % Apply 1 application. topically 2 (two) times daily. 12/14/18   [provider]  verapamil  (CALAN -SR) 120 MG CR tablet TAKE 1 TABLET BY MOUTH EVERY DAY 10/07/22   Tammie Fall, MD    Physical Exam: Constitutional: Moderately built and nourished. Vitals:   08/17/23 2055 08/17/23 2115 08/17/23 2133 08/17/23 2224  BP:  128/71  117/62  Pulse: 92 88  83  Resp: (!) 30 (!) 28  20  Temp:   98.8 F (37.1 C) 100 F (37.8 C)  TempSrc:   Oral Oral  SpO2: 100% 99%  95%  Weight:      Height:       Eyes: Anicteric no pallor. ENMT: No discharge from the ears/nose or mouth. Neck: No mass felt.  No neck rigidity. Respiratory: No rhonchi or crepitations. Cardiovascular: S1-S2 heard. Abdomen: Soft nontender bowel sound present. Musculoskeletal: No edema. Skin: No rash. Neurologic: Alert awake oriented time place and person.  Moves all extremities. Psychiatric: Appears normal.  Normal affect.   Labs on Admission: I have personally  reviewed following labs and imaging studies  CBC: Recent Labs  Lab 08/17/23 1612  WBC 11.2*  HGB 13.1  HCT 39.5  MCV 95.4  PLT 178   Basic Metabolic Panel: Recent Labs  Lab 08/17/23 1612  NA 129*  K 3.6  CL 95*  CO2 21*  GLUCOSE 94  BUN 24*  CREATININE 1.46*  CALCIUM  9.5   GFR: Estimated Creatinine Clearance: 25 mL/min (  A) (by C-G formula based on SCr of 1.46 mg/dL (H)). Liver Function Tests: Recent Labs  Lab 08/17/23 1612  AST 36  ALT 23  ALKPHOS 55  BILITOT 2.5*  PROT 7.0  ALBUMIN 3.6   Recent Labs  Lab 08/17/23 1612  LIPASE 21   No results for input(s): "AMMONIA" in the last 168 hours. Coagulation Profile: No results for input(s): "INR", "PROTIME" in the last 168 hours. Cardiac Enzymes: No results for input(s): "CKTOTAL", "CKMB", "CKMBINDEX", "TROPONINI" in the last 168 hours. BNP (last 3 results) No results for input(s): "PROBNP" in the last 8760 hours. HbA1C: No results for input(s): "HGBA1C" in the last 72 hours. CBG: No results for input(s): "GLUCAP" in the last 168 hours. Lipid Profile: No results for input(s): "CHOL", "HDL", "LDLCALC", "TRIG", "CHOLHDL", "LDLDIRECT" in the last 72 hours. Thyroid Function Tests: No results for input(s): "TSH", "T4TOTAL", "FREET4", "T3FREE", "THYROIDAB" in the last 72 hours. Anemia Panel: No results for input(s): "VITAMINB12", "FOLATE", "FERRITIN", "TIBC", "IRON", "RETICCTPCT" in the last 72 hours. Urine analysis:    Component Value Date/Time   COLORURINE YELLOW 08/17/2023 1558   APPEARANCEUR HAZY (A) 08/17/2023 1558   LABSPEC 1.013 08/17/2023 1558   PHURINE 6.0 08/17/2023 1558   GLUCOSEU NEGATIVE 08/17/2023 1558   HGBUR MODERATE (A) 08/17/2023 1558   BILIRUBINUR NEGATIVE 08/17/2023 1558   KETONESUR 5 (A) 08/17/2023 1558   PROTEINUR 30 (A) 08/17/2023 1558   NITRITE NEGATIVE 08/17/2023 1558   LEUKOCYTESUR SMALL (A) 08/17/2023 1558   Sepsis Labs: @LABRCNTIP (procalcitonin:4,lacticidven:4) )No results  found for this or any previous visit (from the past 240 hours).   Radiological Exams on Admission: CT ABDOMEN PELVIS W CONTRAST Result Date: 08/17/2023 EXAM: CT ABDOMEN AND PELVIS WITH CONTRAST 08/17/2023 08:49:42 PM TECHNIQUE: CT of the abdomen and pelvis was performed with the administration of intravenous contrast. Multiplanar reformatted images are provided for review. Automated exposure control, iterative reconstruction, and/or weight based adjustment of the mA/kV was utilized to reduce the radiation dose to as low as reasonably achievable. COMPARISON: 03/02/2023 CLINICAL HISTORY: Abdominal pain, acute, nonlocalized. Table formatting from the original note was not included. Images from the original note were not included. Pt. BIBEMS from home w/ c/o a fall. No LOC. Pt is on eliquis  BID. Pt. C/o pain and tenderness to her left elbow. Pt. States she had first txt for bladder cancer last Tuesday and has been feeling weak and has had chills since. Per EMS stroke screen; negative. FINDINGS: LOWER CHEST: Mild subpleural reticulation/fibrosis at the lung bases. LIVER: The liver is unremarkable. GALLBLADDER AND BILE DUCTS: Gallbladder is unremarkable. No biliary ductal dilatation. SPLEEN: No acute abnormality. PANCREAS: No acute abnormality. ADRENAL GLANDS: No acute abnormality. KIDNEYS, URETERS AND BLADDER: Simple left upper pole renal cyst measuring up to 18 mm, benign. No follow-up is recommended. Mild diminished/delayed enhancement of the right kidney with associated mild right hydroureteronephrosis, although without a visualized ureteral or bladder calculus. Differential consideration to include recent passage of a ureteral calculus or ascending infection. Trace nondependent gas in the bladder with subtle perivesical stranding, correlate for cystitis. GI AND BOWEL: The appendix is not discretely visualized. PERITONEUM AND RETROPERITONEUM: No ascites. No free air. VASCULATURE: Atherosclerotic calcifications of  the abdominal aorta and branch vessels. LYMPH NODES: No lymphadenopathy. REPRODUCTIVE ORGANS: No acute abnormality. BONES AND SOFT TISSUES: No acute osseous abnormality. No focal soft tissue abnormality. IMPRESSION: 1. Trace nondependent gas in the bladder and subtle perivesical stranding, correlate for cystitis. 2. Mild diminished/delayed enhancement of the right kidney with  associated mild right hydroureteronephrosis. Differential considerations include recent passage of a ureteral calculus or ascending infection. Electronically signed by: Zadie Herter MD 08/17/2023 08:57 PM EDT RP Workstation: [id number]   DG Chest Port 1 View Result Date: 08/17/2023 CLINICAL DATA:  Pain. Fall. History of recent treatment for bladder cancer. Weakness and chills. EXAM: PORTABLE CHEST 1 VIEW COMPARISON:  07/24/2022 FINDINGS: Cardiac enlargement with mild pulmonary vascular congestion. Perihilar and basilar infiltrates are new since prior study, likely edema. Pneumonia would also be a possibility. No pleural effusion or pneumothorax. Mediastinal contours appear intact. Calcification of the aorta. IMPRESSION: Cardiac enlargement with pulmonary vascular congestion and bilateral pulmonary infiltrates. Findings are new since prior study. Electronically Signed   By: Boyce Byes M.D.   On: 08/17/2023 19:41   DG Elbow Complete Left Result Date: 08/17/2023 CLINICAL DATA:  Pain after a fall. Left posterior elbow pain after a fall today. EXAM: LEFT ELBOW - COMPLETE 3+ VIEW COMPARISON:  Left forearm 08/27/2021 FINDINGS: Degenerative changes in the left elbow. No evidence of acute fracture or dislocation. No focal bone lesion or bone destruction. Bone cortex appears intact. No significant effusions. Soft tissue defect over the olecranon region consistent with laceration. No radiopaque soft tissue foreign bodies. Soft tissue calcification projected over the forearm, likely dystrophic. IMPRESSION: No acute bony abnormalities. Soft  tissue defect over the olecranon region consistent with laceration. Electronically Signed   By: Boyce Byes M.D.   On: 08/17/2023 17:29    EKG: Independently reviewed.  A-fib with RVR.  Assessment/Plan Principal Problem:   ARF (acute renal failure) (HCC) Active Problems:   Hypertension   Hypertrophic cardiomyopathy (HCC)   Allergic rhinitis   Atrial fibrillation with RVR (HCC)   Elevated troponin   UTI (urinary tract infection)   Hyponatremia    Acute renal failure with hyponatremia likely from poor oral intake and dehydration.  Will continue with gentle hydration check urine studies and follow metabolic panel closely. A-fib with RVR converted back to sinus rhythm before amiodarone  infusion can be started.  Check TSH.  Continue verapamil  and Eliquis .  Patient wants to continue the home prior dose of verapamil . Elevated troponin could be from A-fib with RVR.  Denies any chest pain.  Will trend cardiac markers.  Check 2D echo.  Patient is on Eliquis  and verapamil .  Consult cardiology. Mild rhabdomyolysis could be from fall.  Gently hydrate follow CK levels.  Hold statins. UTI with CT scan showing possibility of worsening infection in the right kidney with mild right-sided hydronephrosis.  On Levaquin follow-up cultures.  There is also concern for possibility of patient having passed a stone per the CAT scan. Bladder cancer recently being treated with BCG last week at [facility]. Hyperlipidemia hold statins until CK levels improved. Hypertrophic cardiomyopathy being followed by cardiologist. Depression on Wellbutrin .  Since patient has acute renal failure with hyponatremia elevated troponin and UTI will need close monitoring further workup and more than 2 midnight stay.   DVT prophylaxis: Eliquis . Code Status: Full code. Family Communication: Discussed with patient. Disposition Plan: Medical floor. Consults called: Cardiology.  Physical therapy. Admission status:  Observation.

## 2023-08-17 NOTE — ED Triage Notes (Signed)
 Pt. BIBEMS from home w/ c/o a fall. No LOC. Pt is on eliquis  BID. Pt. C/o pain and tenderness to her left elbow. Pt. States she had first txt for bladder cancer last Tuesday and has been feeling weak and has had chills since. Per EMS stroke screen negative.  Per EMS:  BP: 122/80 HR: 98 SPO2: 100% RA CBG:106

## 2023-08-17 NOTE — ED Provider Notes (Signed)
 [place] [facility]MENT AT [facility] Provider Note   CSN: [id number] Arrival date & time: 08/17/23  1508     History  Chief Complaint  Patient presents with   Fatigue    Fall    Laurie Bowman is a 83 y.o. female.  Pt with hx bladder cancer, c/o generalized weakness in past few days. Indicates 5 days ago had treatment of bladder cancer for first time (instilled in bladder), indicates a couple days later with nausea/vomiting x several - emesis not bloody or bilious. Had loose bm. No severe diarrhea. No abd distension. No constant and/or focal abd pain. No known bad food ingestion, ill contacts, travel or antibiotic use. No fever or chills. Has been generally weak since. No focal or unilateral weakness. No change in speech or vision. No headache. No dysuria. No fever/chills. Today tried to get off of couch, and slid down onto buttock and left arm - c/o left elbow pain since. Indicates did not hit head. No loc. Is on eliquis .  EMS indicates cbg 106.   The history is provided by the patient, medical records and the EMS personnel.       Home Medications Prior to Admission medications   Medication Sig Start Date End Date Taking? Authorizing Provider  acetaminophen  (TYLENOL ) 500 MG tablet Take 500 mg by mouth every 4 (four) hours as needed. 09/18/20   [provider]  albuterol  (PROVENTIL ) (2.5 MG/3ML) 0.083% nebulizer solution Take 3 mLs (2.5 mg total) by nebulization every 6 (six) hours as needed for wheezing or shortness of breath. 06/18/21   Rochester Chuck, MD  albuterol  (VENTOLIN  HFA) 108 [id number] Base) MCG/ACT inhaler USE 1-2 PUFFS EVERY 4-6 HOURS AS NEEDED FOR COUGH OR WHEEZE. 07/02/22   Rochester Chuck, MD  apixaban  (ELIQUIS ) 5 MG TABS tablet Take 1 tablet (5 mg total) by mouth 2 (two) times daily. 06/19/23   Tammie Fall, MD  augmented betamethasone dipropionate (DIPROLENE-AF) 0.05 % cream Apply 0.05 application topically daily.    [provider]  benzonatate  (TESSALON ) 100 MG capsule Take 100 mg by mouth daily as needed for cough. 07/02/18   [provider]  buPROPion  (WELLBUTRIN  XL) 150 MG 24 hr tablet Take 150 mg by mouth daily. 12/02/21   [provider]  Cholecalciferol  (VITAMIN D) 50 MCG (2000 UT) CAPS Take 2,000 Units by mouth daily.    [provider]  clobetasol cream (TEMOVATE) 0.05 % Apply 1 application. topically daily. 03/22/18   [provider]  clotrimazole-betamethasone (LOTRISONE) cream Apply 1 application. topically 2 (two) times daily. 03/16/19   [provider]  diclofenac Sodium (VOLTAREN) 1 % GEL Apply 2 g topically 4 (four) times daily. Both knees 04/16/19   [provider]  dicyclomine  (BENTYL ) 20 MG tablet Take 20 mg by mouth daily as needed for spasms. 04/14/18   [provider]  fexofenadine (ALLEGRA) 180 MG tablet Take 180 mg by mouth daily.     [provider]  fluocinonide cream (LIDEX) 0.05 % Apply 1 application topically 2 (two) times daily.     [provider]  fluticasone  (FLOVENT  HFA) 110 MCG/ACT inhaler Use two puffs twice daily for one to two weeks during flares 06/26/23   Rochester Chuck, MD  Fluticasone  Furoate (ARNUITY ELLIPTA ) 100 MCG/ACT AEPB Inhale 1 puff into the lungs daily. 02/27/23   Rochester Chuck, MD  hydrochlorothiazide  (HYDRODIURIL ) 25 MG tablet Take 1 tablet (25 mg total) by mouth daily. 09/04/21 05/17/22  Arrien, Curlee Doss, MD  lansoprazole  (PREVACID ) 30 MG capsule Take 1 capsule (30 mg total) by mouth 2 (two) times daily before a meal. 07/10/21   Rochester Chuck, MD  mometasone  (NASONEX ) 50 MCG/ACT nasal spray Place 2 sprays into the nose daily. 06/26/23   Rochester Chuck, MD  montelukast  (SINGULAIR ) 10 MG tablet Take 1 tablet (10 mg total) by mouth at bedtime. 01/09/23   Rochester Chuck, MD  olopatadine  (PATANOL) 0.1 % ophthalmic solution Place 1 drop into both eyes 2  (two) times daily as needed. 04/18/20   Rochester Chuck, MD  Polyethylene Glycol 3350  (PEG 3350 ) POWD Take 17 g by mouth daily.  06/24/16   [provider]  potassium chloride  (KLOR-CON ) 10 MEQ tablet Take 10 mEq by mouth 2 (two) times daily. 04/21/15   [provider]  rosuvastatin  (CRESTOR ) 10 MG tablet Take 10 mg by mouth daily. 07/13/21   [provider]  tiZANidine (ZANAFLEX) 4 MG tablet Take 4 mg by mouth at bedtime as needed for muscle spasms. 11/16/19   [provider]  triamcinolone cream (KENALOG) 0.1 % Apply 1 application. topically 2 (two) times daily. 12/14/18   [provider]  verapamil  (CALAN -SR) 120 MG CR tablet TAKE 1 TABLET BY MOUTH EVERY DAY 10/07/22   Tammie Fall, MD      Allergies    Cefaclor, Codeine, Fructose, Sulfa antibiotics, Meperidine, Elemental sulfur, and Propoxyphene    Review of Systems   Review of Systems  Constitutional:  Negative for chills, diaphoresis and fever.  HENT:  Negative for sore throat.   Eyes:  Negative for visual disturbance.  Respiratory:  Negative for cough and shortness of breath.   Cardiovascular:  Negative for chest pain, palpitations and leg swelling.  Gastrointestinal:  Positive for nausea and vomiting. Negative for abdominal distention, abdominal pain and constipation.  Genitourinary:  Negative for dysuria and flank pain.  Musculoskeletal:  Negative for back pain and neck pain.  Skin:  Negative for rash.  Neurological:  Negative for syncope, speech difficulty, numbness and headaches.    Physical Exam Updated Vital Signs BP 114/77   Pulse 92   Temp 99.2 F (37.3 C) (Oral)   Resp (!) 30   Ht 1.524 m (5')   Wt 62.6 kg   LMP  (LMP Unknown)   SpO2 100%   BMI 26.95 kg/m  Physical Exam Vitals and nursing note reviewed.  Constitutional:      Appearance: Normal appearance. She is well-developed.  HENT:     Head: Atraumatic.     Comments: No head or scalp contusion, bruising or  tenderness.    Nose: Nose normal.     Mouth/Throat:     Mouth: Mucous membranes are moist.  Eyes:     General: No scleral icterus.    Conjunctiva/sclera: Conjunctivae normal.     Pupils: Pupils are equal, round, and reactive to light.  Neck:     Vascular: No carotid bruit.     Trachea: No tracheal deviation.     Comments: Trachea midline, thyroid not grossly enlarged or tender. No neck stiffness or rigidity.  Cardiovascular:     Rate and Rhythm: Normal rate and regular rhythm.     Pulses: Normal pulses.     Heart sounds: Murmur heard.     No friction rub. No gallop.  Pulmonary:     Effort: Pulmonary effort is normal. No respiratory distress.     Breath sounds: Normal breath sounds.  Chest:  Chest wall: No tenderness.  Abdominal:     General: Bowel sounds are normal. There is no distension.     Palpations: Abdomen is soft.     Tenderness: There is no abdominal tenderness. There is no guarding.  Genitourinary:    Comments: No cva tenderness.  Musculoskeletal:        General: No swelling.     Cervical back: Normal range of motion and neck supple. No rigidity or tenderness. No muscular tenderness.     Comments: CTLS spine, non tender, aligned, no step off. Mild tenderness left elbow, no significant sts noted. No other focal pain or bony tenderness on bil extremity exam.   Skin:    General: Skin is warm and dry.     Findings: No rash.  Neurological:     Mental Status: She is alert.     Comments: Alert, speech normal. Motor/sens grossly intact bil. Stre 5/5.   Psychiatric:        Mood and Affect: Mood normal.     ED Results / Procedures / Treatments   Labs (all labs ordered are listed, but only abnormal results are displayed) Results for orders placed or performed during the hospital encounter of 08/17/23  Urinalysis, Routine w reflex microscopic -Urine, Catheterized   Collection Time: 08/17/23  3:58 PM  Result Value Ref Range   Color, Urine YELLOW YELLOW   APPearance  HAZY (A) CLEAR   Specific Gravity, Urine 1.013 1.005 - 1.030   pH 6.0 5.0 - 8.0   Glucose, UA NEGATIVE NEGATIVE mg/dL   Hgb urine dipstick MODERATE (A) NEGATIVE   Bilirubin Urine NEGATIVE NEGATIVE   Ketones, ur 5 (A) NEGATIVE mg/dL   Protein, ur 30 (A) NEGATIVE mg/dL   Nitrite NEGATIVE NEGATIVE   Leukocytes,Ua SMALL (A) NEGATIVE   RBC / HPF 0-5 0 - 5 RBC/hpf   WBC, UA >50 0 - 5 WBC/hpf   Bacteria, UA RARE (A) NONE SEEN   Squamous Epithelial / HPF 0-5 0 - 5 /HPF   Mucus PRESENT    Hyaline Casts, UA PRESENT   CBC   Collection Time: 08/17/23  4:12 PM  Result Value Ref Range   WBC 11.2 (H) 4.0 - 10.5 K/uL   RBC 4.14 3.87 - 5.11 MIL/uL   Hemoglobin 13.1 12.0 - 15.0 g/dL   HCT 91.4 78.2 - 95.6 %   MCV 95.4 80.0 - 100.0 fL   MCH 31.6 26.0 - 34.0 pg   MCHC 33.2 30.0 - 36.0 g/dL   RDW 21.3 08.6 - 57.8 %   Platelets 178 150 - 400 K/uL   nRBC 0.0 0.0 - 0.2 %  Comprehensive metabolic panel with GFR   Collection Time: 08/17/23  4:12 PM  Result Value Ref Range   Sodium 129 (L) 135 - 145 mmol/L   Potassium 3.6 3.5 - 5.1 mmol/L   Chloride 95 (L) 98 - 111 mmol/L   CO2 21 (L) 22 - 32 mmol/L   Glucose, Bld 94 70 - 99 mg/dL   BUN 24 (H) 8 - 23 mg/dL   Creatinine, Ser 4.69 (H) 0.44 - 1.00 mg/dL   Calcium  9.5 8.9 - 10.3 mg/dL   Total Protein 7.0 6.5 - 8.1 g/dL   Albumin 3.6 3.5 - 5.0 g/dL   AST 36 15 - 41 U/L   ALT 23 0 - 44 U/L   Alkaline Phosphatase 55 38 - 126 U/L   Total Bilirubin 2.5 (H) 0.0 - 1.2 mg/dL   GFR, Estimated 36 (  L) >60 mL/min   Anion gap 13 5 - 15  Lipase, blood   Collection Time: 08/17/23  4:12 PM  Result Value Ref Range   Lipase 21 11 - 51 U/L  Troponin I (High Sensitivity)   Collection Time: 08/17/23  4:12 PM  Result Value Ref Range   Troponin I (High Sensitivity) 35 (H) <18 ng/L     EKG EKG Interpretation Date/Time:  Sunday August 17 2023 16:21:40 EDT Ventricular Rate:  159 PR Interval:  156 QRS Duration:  128 QT Interval:  289 QTC Calculation: 470 R  Axis:   -53  Text Interpretation: Atrial fibrillation with rapid ventricular response Left bundle branch block Confirmed by Guadalupe Lee ([id number]) on 08/17/2023 4:23:30 PM  Radiology CT ABDOMEN PELVIS W CONTRAST Result Date: 08/17/2023 EXAM: CT ABDOMEN AND PELVIS WITH CONTRAST 08/17/2023 08:49:42 PM TECHNIQUE: CT of the abdomen and pelvis was performed with the administration of intravenous contrast. Multiplanar reformatted images are provided for review. Automated exposure control, iterative reconstruction, and/or weight based adjustment of the mA/kV was utilized to reduce the radiation dose to as low as reasonably achievable. COMPARISON: 03/02/2023 CLINICAL HISTORY: Abdominal pain, acute, nonlocalized. Table formatting from the original note was not included. Images from the original note were not included. Pt. BIBEMS from home w/ c/o a fall. No LOC. Pt is on eliquis  BID. Pt. C/o pain and tenderness to her left elbow. Pt. States she had first txt for bladder cancer last Tuesday and has been feeling weak and has had chills since. Per EMS stroke screen; negative. FINDINGS: LOWER CHEST: Mild subpleural reticulation/fibrosis at the lung bases. LIVER: The liver is unremarkable. GALLBLADDER AND BILE DUCTS: Gallbladder is unremarkable. No biliary ductal dilatation. SPLEEN: No acute abnormality. PANCREAS: No acute abnormality. ADRENAL GLANDS: No acute abnormality. KIDNEYS, URETERS AND BLADDER: Simple left upper pole renal cyst measuring up to 18 mm, benign. No follow-up is recommended. Mild diminished/delayed enhancement of the right kidney with associated mild right hydroureteronephrosis, although without a visualized ureteral or bladder calculus. Differential consideration to include recent passage of a ureteral calculus or ascending infection. Trace nondependent gas in the bladder with subtle perivesical stranding, correlate for cystitis. GI AND BOWEL: The appendix is not discretely visualized. PERITONEUM AND  RETROPERITONEUM: No ascites. No free air. VASCULATURE: Atherosclerotic calcifications of the abdominal aorta and branch vessels. LYMPH NODES: No lymphadenopathy. REPRODUCTIVE ORGANS: No acute abnormality. BONES AND SOFT TISSUES: No acute osseous abnormality. No focal soft tissue abnormality. IMPRESSION: 1. Trace nondependent gas in the bladder and subtle perivesical stranding, correlate for cystitis. 2. Mild diminished/delayed enhancement of the right kidney with associated mild right hydroureteronephrosis. Differential considerations include recent passage of a ureteral calculus or ascending infection. Electronically signed by: Zadie Herter MD 08/17/2023 08:57 PM EDT RP Workstation: [id number]   DG Chest Port 1 View Result Date: 08/17/2023 CLINICAL DATA:  Pain. Fall. History of recent treatment for bladder cancer. Weakness and chills. EXAM: PORTABLE CHEST 1 VIEW COMPARISON:  07/24/2022 FINDINGS: Cardiac enlargement with mild pulmonary vascular congestion. Perihilar and basilar infiltrates are new since prior study, likely edema. Pneumonia would also be a possibility. No pleural effusion or pneumothorax. Mediastinal contours appear intact. Calcification of the aorta. IMPRESSION: Cardiac enlargement with pulmonary vascular congestion and bilateral pulmonary infiltrates. Findings are new since prior study. Electronically Signed   By: Boyce Byes M.D.   On: 08/17/2023 19:41   DG Elbow Complete Left Result Date: 08/17/2023 CLINICAL DATA:  Pain after a fall. Left posterior elbow pain after a fall  today. EXAM: LEFT ELBOW - COMPLETE 3+ VIEW COMPARISON:  Left forearm 08/27/2021 FINDINGS: Degenerative changes in the left elbow. No evidence of acute fracture or dislocation. No focal bone lesion or bone destruction. Bone cortex appears intact. No significant effusions. Soft tissue defect over the olecranon region consistent with laceration. No radiopaque soft tissue foreign bodies. Soft tissue calcification  projected over the forearm, likely dystrophic. IMPRESSION: No acute bony abnormalities. Soft tissue defect over the olecranon region consistent with laceration. Electronically Signed   By: Boyce Byes M.D.   On: 08/17/2023 17:29    Procedures Procedures    Medications Ordered in ED Medications  amiodarone  (NEXTERONE  PREMIX) 360-4.14 MG/200ML-% (1.8 mg/mL) IV infusion (60 mg/hr Intravenous New Bag/Given 08/17/23 2104)  amiodarone  (NEXTERONE  PREMIX) 360-4.14 MG/200ML-% (1.8 mg/mL) IV infusion (has no administration in time range)  lactated ringers bolus 1,000 mL (0 mLs Intravenous Stopped 08/17/23 1728)  diltiazem  (CARDIZEM ) 1 mg/mL load via infusion 10 mg (10 mg Intravenous Bolus from Bag 08/17/23 1628)  levofloxacin (LEVAQUIN) IVPB 500 mg (0 mg Intravenous Stopped 08/17/23 1919)  iohexol (OMNIPAQUE) 300 MG/ML solution 100 mL (100 mLs Intravenous Contrast Given 08/17/23 2050)    ED Course/ Medical Decision Making/ A&P                                 Medical Decision Making Problems Addressed: Abdominal pain, generalized: acute illness or injury with systemic symptoms that poses a threat to life or bodily functions Acute UTI: acute illness or injury with systemic symptoms Atrial fibrillation with rapid ventricular response (HCC): acute illness or injury with systemic symptoms that poses a threat to life or bodily functions Failure to thrive in adult: acute illness or injury Generalized weakness: acute illness or injury Malignant neoplasm of urinary bladder, unspecified site [facility]): chronic illness or injury that poses a threat to life or bodily functions  Amount and/or Complexity of Data Reviewed Independent Historian: EMS    Details: hx External Data Reviewed: notes. Labs: ordered. Decision-making details documented in ED Course. Radiology: ordered and independent interpretation performed. Decision-making details documented in ED Course. ECG/medicine tests: ordered and independent  interpretation performed. Decision-making details documented in ED Course. Discussion of management or test interpretation with external provider(s): Cardiology, hospitalist  Risk Prescription drug management. Decision regarding hospitalization.   Iv ns. Continuous pulse ox and cardiac monitoring. Labs ordered/sent. Imaging ordered.   Differential diagnosis includes dehydration, aki, uti, etc. Dispo decision including potential need for admission considered - will get labs and imaging and reassess.   Reviewed nursing notes and prior charts for additional history. External reports reviewed. Additional history from: EMS.   LR bolus.   Cardiac monitor: sinus rhythm, rate 80.  Pt now in afib, rate 150. No chest pain. No new symptoms. Cardizem  bolus/gtt.  On gtt, initially improved hr to 120 range, but hr bounces from 100-160. Given overall condition, discussed with patient that I recommend cardioversion, offered sedation for cardioversion, discussed reasons for cardioversion - pt voices understanding but indicates is ok with med therapy, but does not want cardioversion and will not give consent.   Delay in labs - contacted rn - indicates labs currently being sent.   Labs reviewed/interpreted by me - wbc 11, hct 39. Trop 35, similar to prior. Chem w na sl low, k is normal.  When chem/labs resulted, persistent mid abd tenderness, ct ordered.   Xrays reviewed/interpreted by me - no def pna, ?vascular  congestion. Elbow neg for fx.   Again approached patient re recommending cardioversion, reiterating reasons, concerns, risk of not cardioverting including worsening of condition, heart failure/arrest/death - pt is alert, oriented, voices understanding and appears to possess capacity to make her decisions - continues to refuse cardioversion. Cardiology consulted re additional recs (it appears from prior charting, pt not able to tolerate amiodarone ).  Discussed pt with cardiology fellow on call, ?Dr  Jeryl Moris, he reviewed charts, prior echos, history not tolerating long term amio, etc, and he indicates for now, would stop cardizem , start amio gtt (no bolus, indicates ok for shorter term therapy in patient), and if patient agreeable/cardiovert - changes ordered. Prior to changing meds, and prior to cardioversion, pt converted to NSR rate 90.   Hospitalists consulted for admission.   CT reviewed/interpreted by me - no sbo. +uti.   CRITICAL CARE RE: general weakness, acute uti, abdominal pain, atrial fib with rapid ventricular response/with heart failure, requiring parenteral therapy.  Performed by: Jahir Halt E Anthonymichael Munday Total critical care time: 95 minutes Critical care time was exclusive of separately billable procedures and treating other patients. Critical care was necessary to treat or prevent imminent or life-threatening deterioration. Critical care was time spent personally by me on the following activities: development of treatment plan with patient and/or surrogate as well as nursing, discussions with consultants, evaluation of patient's response to treatment, examination of patient, obtaining history from patient or surrogate, ordering and performing treatments and interventions, ordering and review of laboratory studies, ordering and review of radiographic studies, pulse oximetry and re-evaluation of patient's condition.          Final Clinical Impression(s) / ED Diagnoses Final diagnoses:  Generalized weakness  Failure to thrive in adult  Malignant neoplasm of urinary bladder, unspecified site [facility])  Acute UTI  Abdominal pain, generalized  Atrial fibrillation with rapid ventricular response [facility])    Rx / DC Orders ED Discharge Orders     None         Guadalupe Lee, MD 08/17/23 2106

## 2023-08-17 NOTE — ED Notes (Signed)
 Amiodarone  stopped at this time per VO from Dr. [facility]

## 2023-08-17 NOTE — ED Notes (Signed)
 MD, Moses Arenas made aware of pt. Increased HR.

## 2023-08-17 NOTE — ED Notes (Signed)
 Pt. A&O x 4. Placed on bedpan x 2 staff assist

## 2023-08-18 ENCOUNTER — Observation Stay (HOSPITAL_COMMUNITY)

## 2023-08-18 DIAGNOSIS — R627 Adult failure to thrive: Secondary | ICD-10-CM | POA: Diagnosis present

## 2023-08-18 DIAGNOSIS — N1831 Chronic kidney disease, stage 3a: Secondary | ICD-10-CM | POA: Diagnosis present

## 2023-08-18 DIAGNOSIS — I10 Essential (primary) hypertension: Secondary | ICD-10-CM | POA: Diagnosis not present

## 2023-08-18 DIAGNOSIS — I214 Non-ST elevation (NSTEMI) myocardial infarction: Secondary | ICD-10-CM

## 2023-08-18 DIAGNOSIS — N390 Urinary tract infection, site not specified: Secondary | ICD-10-CM | POA: Diagnosis not present

## 2023-08-18 DIAGNOSIS — C679 Malignant neoplasm of bladder, unspecified: Secondary | ICD-10-CM | POA: Diagnosis present

## 2023-08-18 DIAGNOSIS — E785 Hyperlipidemia, unspecified: Secondary | ICD-10-CM

## 2023-08-18 DIAGNOSIS — J45909 Unspecified asthma, uncomplicated: Secondary | ICD-10-CM | POA: Diagnosis not present

## 2023-08-18 DIAGNOSIS — R531 Weakness: Principal | ICD-10-CM

## 2023-08-18 DIAGNOSIS — N201 Calculus of ureter: Secondary | ICD-10-CM | POA: Diagnosis not present

## 2023-08-18 DIAGNOSIS — R1084 Generalized abdominal pain: Secondary | ICD-10-CM | POA: Diagnosis present

## 2023-08-18 DIAGNOSIS — I4891 Unspecified atrial fibrillation: Secondary | ICD-10-CM | POA: Diagnosis not present

## 2023-08-18 DIAGNOSIS — D630 Anemia in neoplastic disease: Secondary | ICD-10-CM | POA: Diagnosis present

## 2023-08-18 DIAGNOSIS — I421 Obstructive hypertrophic cardiomyopathy: Secondary | ICD-10-CM | POA: Diagnosis present

## 2023-08-18 DIAGNOSIS — E876 Hypokalemia: Secondary | ICD-10-CM | POA: Diagnosis present

## 2023-08-18 DIAGNOSIS — B9620 Unspecified Escherichia coli [E. coli] as the cause of diseases classified elsewhere: Secondary | ICD-10-CM | POA: Diagnosis present

## 2023-08-18 DIAGNOSIS — G9341 Metabolic encephalopathy: Secondary | ICD-10-CM | POA: Diagnosis present

## 2023-08-18 DIAGNOSIS — J4540 Moderate persistent asthma, uncomplicated: Secondary | ICD-10-CM | POA: Diagnosis present

## 2023-08-18 DIAGNOSIS — R7989 Other specified abnormal findings of blood chemistry: Secondary | ICD-10-CM | POA: Diagnosis not present

## 2023-08-18 DIAGNOSIS — N1330 Unspecified hydronephrosis: Secondary | ICD-10-CM | POA: Diagnosis not present

## 2023-08-18 DIAGNOSIS — N179 Acute kidney failure, unspecified: Secondary | ICD-10-CM | POA: Diagnosis present

## 2023-08-18 DIAGNOSIS — N136 Pyonephrosis: Secondary | ICD-10-CM | POA: Diagnosis present

## 2023-08-18 DIAGNOSIS — M6282 Rhabdomyolysis: Secondary | ICD-10-CM | POA: Diagnosis present

## 2023-08-18 DIAGNOSIS — I21A1 Myocardial infarction type 2: Secondary | ICD-10-CM | POA: Diagnosis present

## 2023-08-18 DIAGNOSIS — F32A Depression, unspecified: Secondary | ICD-10-CM | POA: Diagnosis present

## 2023-08-18 DIAGNOSIS — I422 Other hypertrophic cardiomyopathy: Secondary | ICD-10-CM | POA: Diagnosis present

## 2023-08-18 DIAGNOSIS — Y838 Other surgical procedures as the cause of abnormal reaction of the patient, or of later complication, without mention of misadventure at the time of the procedure: Secondary | ICD-10-CM | POA: Diagnosis present

## 2023-08-18 DIAGNOSIS — E869 Volume depletion, unspecified: Secondary | ICD-10-CM | POA: Diagnosis present

## 2023-08-18 DIAGNOSIS — D696 Thrombocytopenia, unspecified: Secondary | ICD-10-CM | POA: Diagnosis not present

## 2023-08-18 DIAGNOSIS — T83192A Other mechanical complication of urinary stent, initial encounter: Secondary | ICD-10-CM | POA: Diagnosis not present

## 2023-08-18 DIAGNOSIS — I129 Hypertensive chronic kidney disease with stage 1 through stage 4 chronic kidney disease, or unspecified chronic kidney disease: Secondary | ICD-10-CM | POA: Diagnosis present

## 2023-08-18 DIAGNOSIS — E860 Dehydration: Secondary | ICD-10-CM | POA: Diagnosis present

## 2023-08-18 DIAGNOSIS — I480 Paroxysmal atrial fibrillation: Secondary | ICD-10-CM

## 2023-08-18 DIAGNOSIS — E871 Hypo-osmolality and hyponatremia: Secondary | ICD-10-CM | POA: Diagnosis present

## 2023-08-18 DIAGNOSIS — I340 Nonrheumatic mitral (valve) insufficiency: Secondary | ICD-10-CM | POA: Diagnosis not present

## 2023-08-18 DIAGNOSIS — I482 Chronic atrial fibrillation, unspecified: Secondary | ICD-10-CM | POA: Diagnosis present

## 2023-08-18 LAB — COMPREHENSIVE METABOLIC PANEL WITH GFR
ALT: 26 U/L (ref 0–44)
AST: 60 U/L — ABNORMAL HIGH (ref 15–41)
Albumin: 2.9 g/dL — ABNORMAL LOW (ref 3.5–5.0)
Alkaline Phosphatase: 47 U/L (ref 38–126)
Anion gap: 9 (ref 5–15)
BUN: 24 mg/dL — ABNORMAL HIGH (ref 8–23)
CO2: 21 mmol/L — ABNORMAL LOW (ref 22–32)
Calcium: 8.6 mg/dL — ABNORMAL LOW (ref 8.9–10.3)
Chloride: 97 mmol/L — ABNORMAL LOW (ref 98–111)
Creatinine, Ser: 1.43 mg/dL — ABNORMAL HIGH (ref 0.44–1.00)
GFR, Estimated: 37 mL/min — ABNORMAL LOW (ref >60)
Glucose, Bld: 97 mg/dL (ref 70–99)
Potassium: 3.4 mmol/L — ABNORMAL LOW (ref 3.5–5.1)
Sodium: 127 mmol/L — ABNORMAL LOW (ref 135–145)
Total Bilirubin: 1.8 mg/dL — ABNORMAL HIGH (ref 0.0–1.2)
Total Protein: 5.9 g/dL — ABNORMAL LOW (ref 6.5–8.1)

## 2023-08-18 LAB — CBC WITH DIFFERENTIAL/PLATELET
Abs Immature Granulocytes: 0.06 10*3/uL (ref 0.00–0.07)
Basophils Absolute: 0 10*3/uL (ref 0.0–0.1)
Basophils Relative: 0 %
Eosinophils Absolute: 0 10*3/uL (ref 0.0–0.5)
Eosinophils Relative: 0 %
HCT: 35.2 % — ABNORMAL LOW (ref 36.0–46.0)
Hemoglobin: 11.6 g/dL — ABNORMAL LOW (ref 12.0–15.0)
Immature Granulocytes: 1 %
Lymphocytes Relative: 7 %
Lymphs Abs: 0.6 10*3/uL — ABNORMAL LOW (ref 0.7–4.0)
MCH: 32.1 pg (ref 26.0–34.0)
MCHC: 33 g/dL (ref 30.0–36.0)
MCV: 97.5 fL (ref 80.0–100.0)
Monocytes Absolute: 0.8 10*3/uL (ref 0.1–1.0)
Monocytes Relative: 8 %
Neutro Abs: 8 10*3/uL — ABNORMAL HIGH (ref 1.7–7.7)
Neutrophils Relative %: 84 %
Platelets: 158 10*3/uL (ref 150–400)
RBC: 3.61 MIL/uL — ABNORMAL LOW (ref 3.87–5.11)
RDW: 13.2 % (ref 11.5–15.5)
WBC: 9.6 10*3/uL (ref 4.0–10.5)
nRBC: 0 % (ref 0.0–0.2)

## 2023-08-18 LAB — URINALYSIS, ROUTINE W REFLEX MICROSCOPIC
Bacteria, UA: NONE SEEN
Bilirubin Urine: NEGATIVE
Glucose, UA: NEGATIVE mg/dL
Ketones, ur: 5 mg/dL — AB
Nitrite: NEGATIVE
Protein, ur: NEGATIVE mg/dL
Specific Gravity, Urine: 1.042 — ABNORMAL HIGH (ref 1.005–1.030)
pH: 6 (ref 5.0–8.0)

## 2023-08-18 LAB — ECHOCARDIOGRAM COMPLETE
Area-P 1/2: 3.48 cm2
Height: 60 in
S' Lateral: 2.7 cm
Weight: 2208 [oz_av]

## 2023-08-18 LAB — TSH: TSH: 3.345 u[IU]/mL (ref 0.350–4.500)

## 2023-08-18 LAB — TROPONIN I (HIGH SENSITIVITY): Troponin I (High Sensitivity): 329 ng/L (ref <18)

## 2023-08-18 LAB — NA AND K (SODIUM & POTASSIUM), RAND UR
Potassium Urine: 57 mmol/L
Sodium, Ur: 21 mmol/L

## 2023-08-18 LAB — OSMOLALITY: Osmolality: 275 mosm/kg (ref 275–295)

## 2023-08-18 LAB — OSMOLALITY, URINE: Osmolality, Ur: 520 mosm/kg (ref 300–900)

## 2023-08-18 MED ORDER — ONDANSETRON HCL 4 MG/2ML IJ SOLN
4.0000 mg | Freq: Once | INTRAMUSCULAR | Status: AC
  Administered 2023-08-18: 4 mg via INTRAVENOUS
  Filled 2023-08-18: qty 2

## 2023-08-18 MED ORDER — ACETAMINOPHEN 325 MG PO TABS
650.0000 mg | ORAL_TABLET | Freq: Once | ORAL | Status: AC | PRN
  Administered 2023-08-18: 650 mg via ORAL
  Filled 2023-08-18: qty 2

## 2023-08-18 MED ORDER — LEVOFLOXACIN IN D5W 500 MG/100ML IV SOLN: 500.0000 mg | INTRAVENOUS | Status: DC

## 2023-08-18 NOTE — Consult Note (Signed)
 I have been asked to see the patient by Dr. Donnetta Gains, for evaluation and management of right hydronephrosis.  History of present illness: 83 year old female who has a history of nonmuscle invasive low-grade bladder cancer presented to the emergency department after receiving 1 dose of intravesical BCG followed by general malaise, weakness, chills and failure to thrive.  She had a CT scan in the emergency department demonstrating right hydroureteronephrosis down to the bladder.  Her labs demonstrate acute renal failure and hyponatremia.  She was noted to be in A-fib with RVR.  She had some confusion.  Patient had her bladder tumor resected on April 15.  She had postoperative gemcitabine instillation into her bladder.  She subsequently started BCG last week.  The patient is complaining of diffuse abdominal pain and feeling lousy with no energy.  Review of systems: A 12 point comprehensive review of systems was obtained and is negative unless otherwise stated in the history of present illness.  Patient Active Problem List   Diagnosis Date Noted   Generalized weakness 08/18/2023   ARF (acute renal failure) (HCC) 08/17/2023   Atrial fibrillation with RVR (HCC) 08/17/2023   Elevated troponin 08/17/2023   UTI (urinary tract infection) 08/17/2023   Hyponatremia 08/17/2023   LV dysfunction 10/03/2022   PAF (paroxysmal atrial fibrillation) (HCC) 10/03/2022   Paroxysmal atrial fibrillation with RVR (HCC) 08/31/2021   Rhabdomyolysis 08/30/2021   Fracture, Colles, left, closed 05/03/2021   Aortic atherosclerosis (HCC) 09/27/2020   Elevated WBC count 09/27/2020   Hypokalemia 09/27/2020   Infection due to Staphylococcus epidermidis 09/27/2020   Other chest pain 09/27/2020   Urticaria    Cancer (HCC)    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   Mitral regurgitation 04/30/2018   LBBB (left bundle branch block) 04/30/2018   PVC's (premature ventricular contractions) 04/30/2018    Asthma with acute exacerbation 06/10/2016   Acute sinusitis 06/10/2016   GERD (gastroesophageal reflux disease) 02/06/2016   Other specified disorders of eyelid 11/09/2015   Allergic rhinitis 09/28/2015   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 06/21/2015   Encounter for long-term (current) use of high-risk medication 06/20/2015   Wound dehiscence 05/08/2015   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   Peripheral visual field defect of both eyes 04/21/2015   Dermatochalasis of both upper eyelids 04/14/2015   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   Asthma 12/16/2013   Benign neoplasm of colon 12/16/2013   Constipation 12/16/2013   Dizziness 12/16/2013   Fatty (change of) liver, not elsewhere classified 12/16/2013   Undiagnosed cardiac murmurs 12/16/2013   Heart murmur, systolic 12/16/2013   Hyperlipidemia, unspecified 12/16/2013   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   Current Outpatient Medications on File Prior to Encounter  Medication Sig Dispense Refill   acetaminophen  (TYLENOL ) 500 MG tablet Take 500 mg by mouth every 4 (four) hours as needed for moderate pain (pain score 4-6).     albuterol  (PROVENTIL ) (2.5 MG/3ML) 0.083% nebulizer solution Take 3 mLs (2.5 mg total) by nebulization every 6 (six) hours as needed for wheezing or shortness of breath. 75 mL 0   albuterol  (VENTOLIN  HFA) 108 (90 Base) MCG/ACT inhaler USE  1-2 PUFFS EVERY 4-6 HOURS AS NEEDED FOR COUGH OR WHEEZE. 8.5 each 1   apixaban  (ELIQUIS ) 5 MG TABS tablet Take 1 tablet (5 mg total) by mouth 2 (two) times daily. 180 tablet 1    augmented betamethasone dipropionate (DIPROLENE-AF) 0.05 % cream Apply 0.05 application  topically daily as needed (Lichen).     benzonatate  (TESSALON ) 100 MG capsule Take 100 mg by mouth daily as needed for cough.     buPROPion  (WELLBUTRIN  XL) 150 MG 24 hr tablet Take 150 mg by mouth daily.     Cholecalciferol  (VITAMIN D) 50 MCG (2000 UT) CAPS Take 2,000 Units by mouth daily.     clobetasol cream (TEMOVATE) 0.05 % Apply 1 application  topically daily as needed (Scratches).     clotrimazole-betamethasone (LOTRISONE) cream Apply 1 application  topically daily as needed (Fugus).     diclofenac Sodium (VOLTAREN) 1 % GEL Apply 2 g topically 4 (four) times daily as needed (Pain). Both knees     dicyclomine  (BENTYL ) 20 MG tablet Take 20 mg by mouth daily as needed for spasms.     fexofenadine (ALLEGRA) 180 MG tablet Take 180 mg by mouth daily.      fluticasone  (FLOVENT  HFA) 110 MCG/ACT inhaler Use two puffs twice daily for one to two weeks during flares (Patient taking differently: Inhale 2 puffs into the lungs daily as needed (Asthma).) 1 each 3   hydrochlorothiazide  (HYDRODIURIL ) 25 MG tablet Take 1 tablet (25 mg total) by mouth daily. 30 tablet 0   lansoprazole  (PREVACID ) 30 MG capsule Take 1 capsule (30 mg total) by mouth 2 (two) times daily before a meal. 30 capsule 5   minoxidil (LONITEN) 2.5 MG tablet Take 1.25 mg by mouth daily.     mometasone  (NASONEX ) 50 MCG/ACT nasal spray Place 2 sprays into the nose daily. (Patient taking differently: Place 2 sprays into the nose daily as needed (Rhinitis).) 34 each 2   montelukast  (SINGULAIR ) 10 MG tablet Take 1 tablet (10 mg total) by mouth at bedtime. 90 tablet 1   olopatadine  (PATANOL) 0.1 % ophthalmic solution Place 1 drop into both eyes 2 (two) times daily as needed. 5 mL 5   Polyethylene Glycol 3350  (PEG 3350 ) POWD Take 17 g by mouth daily.      potassium chloride  (KLOR-CON ) 10 MEQ tablet Take 10-20 mEq by mouth See admin instructions. Take 10 mEq (1  tablet) by mouth every morning and 20 mEq (2 tablets) every night.     rosuvastatin  (CRESTOR ) 10 MG tablet Take 10 mg by mouth daily.     tiZANidine (ZANAFLEX) 4 MG tablet Take 2 mg by mouth at bedtime as needed (Sleep).     triamcinolone cream (KENALOG) 0.1 % Apply 1 application  topically 2 (two) times daily as needed (Eyes).     verapamil  (CALAN -SR) 180 MG CR tablet Take 180 mg by mouth daily.     oxybutynin (DITROPAN-XL) 10 MG 24 hr tablet Take 1 tablet by mouth daily. 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/30/2018   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   Hypokalemia 09/27/2020   Infection due to Staphylococcus epidermidis 09/27/2020   Irritable bowel syndrome 12/16/2013   LBBB (left bundle branch block) 04/30/2018   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   Major depressive disorder, recurrent, unspecified (HCC) 12/16/2013   Mitral regurgitation 04/30/2018   Moderate persistent asthma 09/28/2015   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 06/21/2015   Other chest pain 09/27/2020   Other specified disorders of eyelid 11/09/2015   Pain in unspecified knee 12/16/2013   Peripheral visual field defect of both eyes 04/21/2015   PVC's (premature ventricular contractions) 04/30/2018   Rotator cuff tendonitis 12/16/2013   Undiagnosed cardiac murmurs 12/16/2013   Overview:  Mild MR/TR, echo 2007 without change since 2001   Urticaria    Ventricular premature depolarization 12/16/2013   Wound dehiscence 05/08/2015    Past Surgical History:  Procedure Laterality Date   ABDOMINAL ADHESION SURGERY     ADENOIDECTOMY     BREAST SURGERY     carpel tunnel     CESAREAN SECTION     KNEE SURGERY     LEG SURGERY     bone graft   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/2022   TONSILLECTOMY     VEIN SURGERY     Right Leg    Social History   Tobacco Use   Smoking status: Former    Types: Cigarettes    Passive exposure: Past   Smokeless tobacco: Never  Vaping Use   Vaping status: Never Used  Substance Use Topics   Alcohol use: No    Alcohol/week: 0.0 standard drinks of alcohol   Drug use: No    Family History  Problem Relation Age of Onset   Congestive Heart Failure Mother    Hypertension Mother    Hyperlipidemia Sister    Dementia Sister    Alzheimer's disease Sister    Stroke Maternal Grandmother    Breast cancer Sister    Hyperlipidemia Sister    Allergic rhinitis Neg Hx    Angioedema Neg Hx     Asthma Neg Hx    Eczema Neg Hx    Immunodeficiency Neg Hx    Urticaria Neg Hx    Atopy Neg Hx     PE: Vitals:   08/17/23 2133 08/17/23 2224 08/18/23 0246 08/18/23 1213  BP:  117/62 (!) 100/52 119/65  Pulse:  83 70 76  Resp:  20 18   Temp: 98.8  F (37.1 C) 100 F (37.8 C) 98.3 F (36.8 C) 98.1 F (36.7 C)  TempSrc: Oral Oral Oral Oral  SpO2:  95% 99% 100%  Weight:      Height:  5' (1.524 m)     Patient appears to be in no acute distress  patient is alert and oriented x3 Atraumatic normocephalic head No cervical or supraclavicular lymphadenopathy appreciated No increased work of breathing, no audible wheezes/rhonchi Regular sinus rhythm/rate Abdomen is soft, nontender, nondistended, significant left CVA tenderness Lower extremities are symmetric without appreciable edema Grossly neurologically intact No identifiable skin lesions  Recent Labs    08/17/23 1612 08/18/23 0034  WBC 11.2* 9.6  HGB 13.1 11.6*  HCT 39.5 35.2*   Recent Labs    08/17/23 1612 08/18/23 0034  NA 129* 127*  K 3.6 3.4*  CL 95* 97*  CO2 21* 21*  GLUCOSE 94 97  BUN 24* 24*  CREATININE 1.46* 1.43*  CALCIUM  9.5 8.6*   No results for input(s): "LABPT", "INR" in the last 72 hours. No results for input(s): "LABURIN" in the last 72 hours. Results for orders placed or performed during the hospital encounter of 08/17/23  Urine Culture     Status: Abnormal (Preliminary result)   Collection Time: 08/17/23  3:45 PM   Specimen: Urine, Clean Catch  Result Value Ref Range Status   Specimen Description   Final    URINE, CLEAN CATCH Performed at [facility], [address], [place], [place] [address]    Special Requests   Final    NONE Performed at [facility], [address], [place], [place] [address]    Culture (A)  Final    >=100,000 COLONIES/mL GRAM NEGATIVE RODS IDENTIFICATION AND SUSCEPTIBILITIES TO FOLLOW Performed at [facility], [address], [place], [place] [address]    Report Status PENDING  Incomplete  Culture, blood (Routine X 2) w Reflex to ID Panel     Status: None (Preliminary result)   Collection Time: 08/18/23 12:39 PM   Specimen: BLOOD RIGHT ARM  Result Value Ref Range Status   Specimen Description   Final    BLOOD RIGHT ARM Performed at [facility], [address], [place], [place] [address]    Special Requests   Final    BOTTLES DRAWN AEROBIC AND ANAEROBIC Blood Culture adequate volume Performed at [facility], [address], [place], [place] [address]    Culture PENDING  Incomplete   Report Status PENDING  Incomplete    Imaging: I reviewed the patient's CT scan of the abdomen pelvis performed upon admission.  She has a delayed left nephrogram with hydroureteronephrosis down to the bladder.  In addition, there is some thickening at the bladder wall the area of the trigone.  Imp: Right-sided hydroureteronephrosis likely secondary to her recent TURBT.  She has a delayed nephrogram suggesting that right kidney is least partially obstructed.  When palpating her right CVA she has significant tenderness in that area.  I suspect that placing a stent is going to make her feel significantly better.  Recommendations: Plan to take the patient to the operating room tomorrow morning for stent placement.  I discussed this with her this evening and I have scheduled her for 7:30 AM stent placement.  She will be n.p.o. past midnight.  Please obtain consent.  Thank you for involving me in this patient's care, I will continue to follow along.    Epifania Haskell  Dulcy Gibney

## 2023-08-18 NOTE — Progress Notes (Addendum)
 PROGRESS NOTE    Laurie Bowman  ZOX:[id number] DOB: 03-Mar-1941 DOA: 08/17/2023 PCP: Sheilah Denver., MD  Chief Complaint  Patient presents with   Fatigue    Fall    Brief Narrative:    Laurie Bowman is Laurie Bowman 83 y.o. female with history of hypertrophic cardiomyopathy, paroxysmal atrial fibrillation, hyperlipidemia, recently diagnosed bladder cancer underwent BCG treatment last week at [facility] since then has been feeling weak tired fatigued poor appetite and also has been having increasing pain on micturition.  Had subjective feeling of fever chills.  Patient felt so weak today that she slid out of the bed did not hit her head or lose consciousness.   She's been admitted with general malaise, AKI.  CT with findings possibly concerning for Laurie Bowman UTI.    See below for additional details.  Assessment & Plan:   Principal Problem:   ARF (acute renal failure) (HCC) Active Problems:   Rhabdomyolysis   Hypertension   Hypertrophic cardiomyopathy (HCC)   Allergic rhinitis   Atrial fibrillation with RVR (HCC)   Elevated troponin   UTI (urinary tract infection)   Hyponatremia  Acute renal failure with hyponatremia likely from poor oral intake and dehydration. CT with mild R hydroureteronephrosis (possibly with recent passage of stone or infection) Continue IVF (holding thiazide for now)  UTI with CT scan showing possibility of worsening infection in the right kidney with mild right-sided hydronephrosis.   UA with pyuria, but equivocal  Continue levaquin and follow cultures   Laurie Bowman-fib with RVR  converted back to sinus rhythm  TSH wnl She requested lower dose of verapamil  (recently increased in late may)  before amiodarone  infusion can be started.  Check TSH.  Continue verapamil  and Eliquis .  Patient wants to continue the home prior dose of verapamil .  Generalized Malaise  seems to have started after intravesical BCG, will discuss with urology  Acute Metabolic Encephalopathy  Suspect due  to above  Delirium   Elevated troponin could be demand from Laurie Bowman-fib with RVR.  Denies any chest pain.   Troponins trending up Follow echo for Laurie Bowman [place] Appreciate cardiology assistance - suspected demand Continue Eliquis  and verapamil .    Mild rhabdomyolysis  Elevated CK  could be from fall +/- poor PO intake   Holding statin UA concerning for myoglobinuria with moderate Hb and 0-5 RBC's Continue IVF  Bladder cancer S/p TURBT 06/2023, intravesicular BCG last week   Hyperlipidemia hold statins until CK levels improved.  Hypertension Verapamil  Holding thiazide, holding minoxidil   Hypertrophic cardiomyopathy being followed by cardiologist.  Depression on Wellbutrin .     DVT prophylaxis: eliquis  Code Status: ful Family Communication: none Disposition:   Status is: Inpatient Remains inpatient appropriate because: need for ongoing care   Consultants:  Cardiology urology  Procedures:  none  Antimicrobials:  Anti-infectives (From admission, onward)    Start     Dose/Rate Route Frequency Ordered Stop   08/19/23 1800  levofloxacin (LEVAQUIN) IVPB 750 mg  Status:  Discontinued        750 mg 100 mL/hr over 90 Minutes Intravenous Every 48 hours 08/17/23 2305 08/18/23 0310   08/19/23 1800  levofloxacin (LEVAQUIN) IVPB 500 mg        500 mg 100 mL/hr over 60 Minutes Intravenous Every 48 hours 08/18/23 0310     08/17/23 1815  levofloxacin (LEVAQUIN) IVPB 500 mg        500 mg 100 mL/hr over 60 Minutes Intravenous  Once 08/17/23 1808 08/17/23 1919  Subjective: She's confused C/o being cold   Objective: Vitals:   08/17/23 2133 08/17/23 2224 08/18/23 0246 08/18/23 1213  BP:  117/62 (!) 100/52 119/65  Pulse:  83 70 76  Resp:  20 18   Temp: 98.8 F (37.1 C) 100 F (37.8 C) 98.3 F (36.8 C) 98.1 F (36.7 C)  TempSrc: Oral Oral Oral Oral  SpO2:  95% 99% 100%  Weight:      Height:  5' (1.524 m)      Intake/Output Summary (Last 24 hours) at 08/18/2023 1451 Last  data filed at 08/18/2023 0800 Gross per 24 hour  Intake 822.84 ml  Output 400 ml  Net 422.84 ml   Filed Weights   08/17/23 1532  Weight: 62.6 kg    Examination:  The room is particularly cold  General exam: Appears uncomfortable, she's cold, shivering Respiratory system: unlabored Cardiovascular system: RRR Central nervous system: Alert, but confused. No focal neurological deficits. Extremities: no LEE   Data Reviewed: I have personally reviewed following labs and imaging studies  CBC: Recent Labs  Lab 08/17/23 1612 08/18/23 0034  WBC 11.2* 9.6  NEUTROABS  --  8.0*  HGB 13.1 11.6*  HCT 39.5 35.2*  MCV 95.4 97.5  PLT 178 158    Basic Metabolic Panel: Recent Labs  Lab 08/17/23 1612 08/18/23 0034  NA 129* 127*  K 3.6 3.4*  CL 95* 97*  CO2 21* 21*  GLUCOSE 94 97  BUN 24* 24*  CREATININE 1.46* 1.43*  CALCIUM  9.5 8.6*    GFR: Estimated Creatinine Clearance: 25.5 mL/min (Laurie Bowman) (by C-G formula based on SCr of 1.43 mg/dL (H)).  Liver Function Tests: Recent Labs  Lab 08/17/23 1612 08/18/23 0034  AST 36 60*  ALT 23 26  ALKPHOS 55 47  BILITOT 2.5* 1.8*  PROT 7.0 5.9*  ALBUMIN 3.6 2.9*    CBG: No results for input(s): "GLUCAP" in the last 168 hours.   No results found for this or any previous visit (from the past 240 hours).       Radiology Studies: ECHOCARDIOGRAM COMPLETE Result Date: 08/18/2023    ECHOCARDIOGRAM REPORT   Patient Name:   Laurie Bowman Date of Exam: 08/18/2023 Medical Rec #:  [id number]    Height:       60.0 in Accession #:    [id number]   Weight:       138.0 lb Date of Birth:  1940/10/02    BSA:          1.594 m Patient Age:    83 years     BP:           100/52 mmHg Patient Gender: F            HR:           75 bpm. Exam Location:  Inpatient Procedure: 2D Echo, Cardiac Doppler and Color Doppler (Both Spectral and Color            Flow Doppler were utilized during procedure). Indications:    Elevated Troponin  History:        Patient has prior  history of Echocardiogram examinations.                 Arrythmias:Atrial Fibrillation; Risk Factors:Hypertension and                 Dyslipidemia.  Sonographer:    Laurie Bowman RDCS, FE, PE Referring Phys: [id number] ARSHAD N KAKRAKANDY IMPRESSIONS  1. LV outflow tract peak gradient  at rest 138 mmHg. Left ventricular ejection fraction, by estimation, is 50 to 55%. The left ventricle has low normal function. The left ventricle has no regional wall motion abnormalities but there is septal-lateral dyssynchrony consistent with LBBB. There is moderate asymmetric left ventricular hypertrophy of the septal segment. Left ventricular diastolic parameters are consistent with Grade I diastolic dysfunction (impaired relaxation).  2. Right ventricular systolic function is normal. The right ventricular size is normal. There is moderately elevated pulmonary artery systolic pressure. The estimated right ventricular systolic pressure is 55.6 mmHg.  3. Left atrial size was moderately dilated.  4. Mitral valve systolic anterior motion is present. The mitral valve is abnormal. Moderate eccentric mitral valve regurgitation. No evidence of mitral stenosis.  5. The aortic valve is tricuspid. There is mild calcification of the aortic valve. Aortic valve regurgitation is not visualized. No aortic stenosis is present.  6. The inferior vena cava is normal in size with <50% respiratory variability, suggesting right atrial pressure of 8 mmHg.  7. 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 FINDINGS  Left Ventricle: LV outflow tract peak gradient at rest 138 mmHg. Left ventricular ejection fraction, by estimation, is 50 to 55%. The left ventricle has low normal function. The left ventricle has no regional wall motion abnormalities. The left ventricular internal cavity size was normal in size. There is moderate asymmetric left ventricular hypertrophy of the septal segment. Left ventricular diastolic parameters are consistent with Grade I diastolic  dysfunction (impaired relaxation). Right Ventricle: The right ventricular size is normal. No increase in right ventricular wall thickness. Right ventricular systolic function is normal. There is moderately elevated pulmonary artery systolic pressure. The tricuspid regurgitant velocity is 3.45 m/s, and with an assumed right atrial pressure of 8 mmHg, the estimated right ventricular systolic pressure is 55.6 mmHg. Left Atrium: Left atrial size was moderately dilated. Right Atrium: Right atrial size was normal in size. Pericardium: There is no evidence of pericardial effusion. Mitral Valve: Mitral valve systolic anterior motion is present. The mitral valve is abnormal. Moderate mitral valve regurgitation. No evidence of mitral valve stenosis. Tricuspid Valve: The tricuspid valve is normal in structure. Tricuspid valve regurgitation is mild. Aortic Valve: The aortic valve is tricuspid. There is mild calcification of the aortic valve. Aortic valve regurgitation is not visualized. No aortic stenosis is present. Pulmonic Valve: The pulmonic valve was normal in structure. Pulmonic valve regurgitation is trivial. Aorta: The aortic root is normal in size and structure. Venous: The inferior vena cava is normal in size with less than 50% respiratory variability, suggesting right atrial pressure of 8 mmHg. IAS/Shunts: No atrial level shunt detected by color flow Doppler.  LEFT VENTRICLE PLAX 2D LVIDd:         3.50 cm   Diastology LVIDs:         2.70 cm   LV e' medial:    4.68 cm/s LV PW:         1.10 cm   LV E/e' medial:  23.5 LV IVS:        1.50 cm   LV e' lateral:   6.74 cm/s LVOT diam:     1.90 cm   LV E/e' lateral: 16.3 LVOT Area:     2.84 cm  RIGHT VENTRICLE RV Basal diam:  2.60 cm RV Mid diam:    2.00 cm RV S prime:     17.00 cm/s TAPSE (M-mode): 1.3 cm LEFT ATRIUM             Index  RIGHT ATRIUM           Index LA Vol (A2C):   59.5 ml 37.32 ml/m  RA Area:     12.70 cm LA Vol (A4C):   69.2 ml 43.41 ml/m  RA  Volume:   28.00 ml  17.56 ml/m LA Biplane Vol: 65.1 ml 40.83 ml/m   AORTA Ao Root diam: 2.60 cm Ao Asc diam:  2.70 cm MITRAL VALVE                TRICUSPID VALVE MV Area (PHT): 3.48 cm     TR Peak grad:   47.6 mmHg MV Decel Time: 218 msec     TR Vmax:        345.00 cm/s MV E velocity: 110.00 cm/s MV Shadara Lopez velocity: 118.00 cm/s  SHUNTS MV E/Shenice Dolder ratio:  0.93         Systemic Diam: 1.90 cm Dalton McleanMD Electronically signed by Archer Bear Signature Date/Time: 08/18/2023/2:32:04 PM    Final    CT ABDOMEN PELVIS W CONTRAST Result Date: 08/17/2023 EXAM: CT ABDOMEN AND PELVIS WITH CONTRAST 08/17/2023 08:49:42 PM TECHNIQUE: CT of the abdomen and pelvis was performed with the administration of intravenous contrast. Multiplanar reformatted images are provided for review. Automated exposure control, iterative reconstruction, and/or weight based adjustment of the mA/kV was utilized to reduce the radiation dose to as low as reasonably achievable. COMPARISON: 03/02/2023 CLINICAL HISTORY: Abdominal pain, acute, nonlocalized. Table formatting from the original note was not included. Images from the original note were not included. Pt. BIBEMS from home w/ c/o Raygen Dahm fall. No LOC. Pt is on eliquis  BID. Pt. C/o pain and tenderness to her left elbow. Pt. States she had first txt for bladder cancer last Tuesday and has been feeling weak and has had chills since. Per EMS stroke screen; negative. FINDINGS: LOWER CHEST: Mild subpleural reticulation/fibrosis at the lung bases. LIVER: The liver is unremarkable. GALLBLADDER AND BILE DUCTS: Gallbladder is unremarkable. No biliary ductal dilatation. SPLEEN: No acute abnormality. PANCREAS: No acute abnormality. ADRENAL GLANDS: No acute abnormality. KIDNEYS, URETERS AND BLADDER: Simple left upper pole renal cyst measuring up to 18 mm, benign. No follow-up is recommended. Mild diminished/delayed enhancement of the right kidney with associated mild right hydroureteronephrosis, although without Essam Lowdermilk  visualized ureteral or bladder calculus. Differential consideration to include recent passage of Jehiel Koepp ureteral calculus or ascending infection. Trace nondependent gas in the bladder with subtle perivesical stranding, correlate for cystitis. GI AND BOWEL: The appendix is not discretely visualized. PERITONEUM AND RETROPERITONEUM: No ascites. No free air. VASCULATURE: Atherosclerotic calcifications of the abdominal aorta and branch vessels. LYMPH NODES: No lymphadenopathy. REPRODUCTIVE ORGANS: No acute abnormality. BONES AND SOFT TISSUES: No acute osseous abnormality. No focal soft tissue abnormality. IMPRESSION: 1. Trace nondependent gas in the bladder and subtle perivesical stranding, correlate for cystitis. 2. Mild diminished/delayed enhancement of the right kidney with associated mild right hydroureteronephrosis. Differential considerations include recent passage of Sierria Bruney ureteral calculus or ascending infection. Electronically signed by: Zadie Herter MD 08/17/2023 08:57 PM EDT RP Workstation: [id number]   DG Chest Port 1 View Result Date: 08/17/2023 CLINICAL DATA:  Pain. Fall. History of recent treatment for bladder cancer. Weakness and chills. EXAM: PORTABLE CHEST 1 VIEW COMPARISON:  07/24/2022 FINDINGS: Cardiac enlargement with mild pulmonary vascular congestion. Perihilar and basilar infiltrates are new since prior study, likely edema. Pneumonia would also be Lunetta Marina possibility. No pleural effusion or pneumothorax. Mediastinal contours appear intact. Calcification of the aorta. IMPRESSION: Cardiac enlargement with pulmonary vascular congestion  and bilateral pulmonary infiltrates. Findings are new since prior study. Electronically Signed   By: Boyce Byes M.D.   On: 08/17/2023 19:41   DG Elbow Complete Left Result Date: 08/17/2023 CLINICAL DATA:  Pain after Olive Motyka fall. Left posterior elbow pain after Imri Lor fall today. EXAM: LEFT ELBOW - COMPLETE 3+ VIEW COMPARISON:  Left forearm 08/27/2021 FINDINGS: Degenerative  changes in the left elbow. No evidence of acute fracture or dislocation. No focal bone lesion or bone destruction. Bone cortex appears intact. No significant effusions. Soft tissue defect over the olecranon region consistent with laceration. No radiopaque soft tissue foreign bodies. Soft tissue calcification projected over the forearm, likely dystrophic. IMPRESSION: No acute bony abnormalities. Soft tissue defect over the olecranon region consistent with laceration. Electronically Signed   By: Boyce Byes M.D.   On: 08/17/2023 17:29        Scheduled Meds:  apixaban   5 mg Oral BID   buPROPion   150 mg Oral Daily   verapamil   120 mg Oral Daily   Continuous Infusions:  lactated ringers 75 mL/hr at 08/18/23 1409   [START ON 08/19/2023] levofloxacin (LEVAQUIN) IV       LOS: 0 days    Time spent: over 30 min    Donnetta Gains, MD [facility]   To contact the attending provider between 7A-7P or the covering provider during after hours 7P-7A, please log into the web site [email or web address] and access using universal [place] password for that web site. If you do not have the password, please call the hospital operator.  08/18/2023, 2:51 PM

## 2023-08-18 NOTE — Anesthesia Preprocedure Evaluation (Addendum)
 Anesthesia Evaluation    Reviewed: Allergy & Precautions, Patient's Chart, lab work & pertinent test results  History of Anesthesia Complications Negative for: history of anesthetic complications  Airway        Dental   Pulmonary asthma , former smoker          Cardiovascular hypertension, + dysrhythmias (on Eliquis ) Atrial Fibrillation   TTE 08/18/23:  1. LV outflow tract peak gradient at rest 138 mmHg. Left ventricular  ejection fraction, by estimation, is 50 to 55%. The left ventricle has low  normal function. The left ventricle has no regional wall motion  abnormalities but there is septal-lateral  dyssynchrony consistent with LBBB. There is moderate asymmetric left  ventricular hypertrophy of the septal segment. Left ventricular diastolic  parameters are consistent with Grade I diastolic dysfunction (impaired  relaxation).   2. Right ventricular systolic function is normal. The right ventricular  size is normal. There is moderately elevated pulmonary artery systolic  pressure. The estimated right ventricular systolic pressure is 55.6 mmHg.   3. Left atrial size was moderately dilated.   4. Mitral valve systolic anterior motion is present. The mitral valve is  abnormal. Moderate eccentric mitral valve regurgitation. No evidence of  mitral stenosis.   5. The aortic valve is tricuspid. There is mild calcification of the  aortic valve. Aortic valve regurgitation is not visualized. No aortic  stenosis is present.   6. The inferior vena cava is normal in size with <50% respiratory  variability, suggesting right atrial pressure of 8 mmHg.   7. 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     Neuro/Psych   Anxiety Depression       GI/Hepatic Neg liver ROS,GERD  ,,  Endo/Other  negative endocrine ROS    Renal/GU Renal InsufficiencyRenal disease (Cr 1.43, Na 127)HYDRONEPHROSIS,LEFT     Musculoskeletal negative musculoskeletal ROS (+)     Abdominal   Peds  Hematology negative hematology ROS (+)   Anesthesia Other Findings Day of surgery medications reviewed with patient.  Reproductive/Obstetrics                             Anesthesia Physical Anesthesia Plan  ASA: 4  Anesthesia Plan:    Post-op Pain Management:    Induction:   PONV Risk Score and Plan:   Airway Management Planned:   Additional Equipment:   Intra-op Plan:   Post-operative Plan:   Informed Consent:   Plan Discussed with:   Anesthesia Plan Comments: (Needs cardiology input regarding severe gradient with HOCM prior to anesthesia.)       Anesthesia Quick Evaluation

## 2023-08-18 NOTE — Evaluation (Signed)
 Physical Therapy Evaluation Patient Details Name: Laurie Bowman MRN: [id number] DOB: [birth year] Today's Date: 08/18/2023  History of Present Illness  83 yo female admitted with ARF, UTI, hyponatremia, delirium. Hx of L TKA, bladder Ca-undergoing tx, cardiomyopathy, lichen slerosus, osteoporosis, breast Ca, IBS, CKD, asthma, rhabdo, falls  Clinical Impression  Bed level eval only on today. Pt reports feeling weak/tired. She did allow me to assess her UEs/LEs while in bed. PT reports she required +2 assist on/off bsc with nursing. Will plan to return on tomorrow to continue assessment. May need to consider rehab placement-will update recommendation on tomorrow as well. Pt c/o neck and headache during session as well-issued warm packs-made RN aware.         If plan is discharge home, recommend the following: A lot of help with walking and/or transfers;A lot of help with bathing/dressing/bathroom;Assistance with cooking/housework;Assist for transportation;Help with stairs or ramp for entrance   Can travel by private vehicle        Equipment Recommendations None recommended by PT  Recommendations for Other Services       Functional Status Assessment Patient has had a recent decline in their functional status and demonstrates the ability to make significant improvements in function in a reasonable and predictable amount of time.     Precautions / Restrictions Precautions Precautions: Fall Restrictions Weight Bearing Restrictions Per Provider Order: No      Mobility  Bed Mobility               General bed mobility comments: NT-pt c/o feeling weak and tired. Reports it took 2 assist to get on/off bsc with nursing.    Transfers                        Ambulation/Gait                  Stairs            Wheelchair Mobility     Tilt Bed    Modified Rankin (Stroke Patients Only)       Balance                                              Pertinent Vitals/Pain Pain Assessment Pain Assessment: Faces Faces Pain Scale: Hurts even more Pain Location: neck, headache Pain Intervention(s): Heat applied, Repositioned, Monitored during session, Limited activity within patient's tolerance, Patient requesting pain meds-RN notified    Home Living Family/patient expects to be discharged to:: Private residence Living Arrangements: Spouse/significant other Available Help at Discharge: Family;Available 24 hours/day Type of Home: House Home Access: Stairs to enter Entrance Stairs-Rails: Right;Left Entrance Stairs-Number of Steps: 3 Alternate Level Stairs-Number of Steps: 1 flight Home Layout: Two level;Bed/bath upstairs Home Equipment: Shower seat;Grab bars - toilet;Rolling Walker (2 wheels);Cane - single point;Grab bars - tub/shower      Prior Function Prior Level of Function : History of Falls (last six months);Independent/Modified Independent             Mobility Comments: ind. uses RW when she has episodes of weakness ADLs Comments: mod ind     Extremity/Trunk Assessment   Upper Extremity Assessment Upper Extremity Assessment: Defer to OT evaluation    Lower Extremity Assessment Lower Extremity Assessment: Generalized weakness (also briefly assessed UE-weak grip, strength ~3/5 throughout)       Communication  Communication Communication: No apparent difficulties    Cognition Arousal: Alert Behavior During Therapy: WFL for tasks assessed/performed   PT - Cognitive impairments: No apparent impairments                         Following commands: Intact       Cueing Cueing Techniques: Verbal cues     General Comments      Exercises General Exercises - Lower Extremity Ankle Circles/Pumps: AROM, Both, 5 reps   Assessment/Plan    PT Assessment Patient needs continued PT services  PT Problem List Decreased strength;Decreased range of motion;Decreased activity tolerance;Decreased  balance;Decreased mobility;Decreased knowledge of use of DME;Pain       PT Treatment Interventions DME instruction;Gait training;Functional mobility training;Therapeutic activities;Therapeutic exercise;Patient/family education;Balance training    PT Goals (Current goals can be found in the Care Plan section)  Acute Rehab PT Goals Patient Stated Goal: regain plof/independence;less discomfort/pain PT Goal Formulation: With patient Time For Goal Achievement: 09/01/23 Potential to Achieve Goals: Good    Frequency Min 3X/week     Co-evaluation               AM-PAC PT "6 Clicks" Mobility  Outcome Measure Help needed turning from your back to your side while in a flat bed without using bedrails?: A Lot Help needed moving from lying on your back to sitting on the side of a flat bed without using bedrails?: A Lot Help needed moving to and from a bed to a chair (including a wheelchair)?: A Lot Help needed standing up from a chair using your arms (e.g., wheelchair or bedside chair)?: A Lot Help needed to walk in hospital room?: Total Help needed climbing 3-5 steps with a railing? : Total 6 Click Score: 10    End of Session   Activity Tolerance: Patient tolerated treatment well;Patient limited by fatigue;Patient limited by pain Patient left: in bed;with call bell/phone within reach;with bed alarm set   PT Visit Diagnosis: Muscle weakness (generalized) (M62.81);History of falling (Z91.81);Difficulty in walking, not elsewhere classified (R26.2)    Time: 1610-9604 PT Time Calculation (min) (ACUTE ONLY): 8 min   Charges:   PT Evaluation $PT Eval Low Complexity: 1 Low   PT General Charges $$ ACUTE PT VISIT: 1 Visit            Tanda Falter, PT Acute Rehabilitation  Office: [phone]

## 2023-08-18 NOTE — TOC Initial Note (Signed)
 Transition of Care [facility]) - Initial/Assessment Note   Patient Details  Name: Laurie Bowman MRN: [id number] Date of Birth: 1941-02-05  Transition of Care [facility]) CM/SW Contact:    Zenon Hilda, LCSW Phone Number: 08/18/2023, 1:55 PM  Clinical Narrative: Patient is from home with spouse. TOC to follow for possible discharge needs.  Expected Discharge Plan: Home/Self Care Barriers to Discharge: Continued Medical Work up  Expected Discharge Plan and Services In-house Referral: Clinical Social Work Living arrangements for the past 2 months: Single Family Home            DME Arranged: N/A DME Agency: NA  Prior Living Arrangements/Services Living arrangements for the past 2 months: Single Family Home Lives with:: Spouse Patient language and need for interpreter reviewed:: Yes Do you feel safe going back to the place where you live?: Yes      Need for Family Participation in Patient Care: No (Comment) Care giver support system in place?: Yes (comment) Criminal Activity/Legal Involvement Pertinent to Current Situation/Hospitalization: No - Comment as needed  Activities of Daily Living ADL Screening (condition at time of admission) Independently performs ADLs?: Yes (appropriate for developmental age) Is the patient deaf or have difficulty hearing?: No Does the patient have difficulty seeing, even when wearing glasses/contacts?: No Does the patient have difficulty concentrating, remembering, or making decisions?: No  Emotional Assessment Orientation: : Oriented to Self, Oriented to Place, Oriented to  Time, Oriented to Situation Alcohol / Substance Use: Not Applicable Psych Involvement: No (comment)  Admission diagnosis:  Abdominal pain, generalized [R10.84] ARF (acute renal failure) (HCC) [N17.9] Acute UTI [N39.0] Failure to thrive in adult [R62.7] Atrial fibrillation with rapid ventricular response (HCC) [I48.91] Generalized weakness [R53.1] Malignant neoplasm of urinary bladder,  unspecified site [facility]) [C67.9] Patient Active Problem List   Diagnosis Date Noted   ARF (acute renal failure) (HCC) 08/17/2023   Atrial fibrillation with RVR (HCC) 08/17/2023   Elevated troponin 08/17/2023   UTI (urinary tract infection) 08/17/2023   Hyponatremia 08/17/2023   LV dysfunction 10/03/2022   PAF (paroxysmal atrial fibrillation) (HCC) 10/03/2022   Paroxysmal atrial fibrillation with RVR (HCC) 08/31/2021   Rhabdomyolysis 08/30/2021   Fracture, [place], left, closed 05/03/2021   Aortic atherosclerosis (HCC) 09/27/2020   Elevated WBC count 09/27/2020   Hypokalemia 09/27/2020   Infection due to Staphylococcus epidermidis 09/27/2020   Other chest pain 09/27/2020   Urticaria    Cancer (HCC)    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   Mitral regurgitation 04/30/2018   LBBB (left bundle branch block) 04/30/2018   PVC's (premature ventricular contractions) 04/30/2018   Asthma with acute exacerbation 06/10/2016   Acute sinusitis 06/10/2016   GERD (gastroesophageal reflux disease) 02/06/2016   Other specified disorders of eyelid 11/09/2015   Allergic rhinitis 09/28/2015   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 06/21/2015   Encounter for long-term (current) use of high-risk medication 06/20/2015   Wound dehiscence 05/08/2015   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   Peripheral visual field defect of both eyes 04/21/2015   Dermatochalasis of both upper eyelids 04/14/2015   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   Asthma 12/16/2013   Benign neoplasm of colon 12/16/2013   Constipation 12/16/2013   Dizziness 12/16/2013   Fatty (change of) liver, not elsewhere classified 12/16/2013   Undiagnosed cardiac murmurs 12/16/2013   Heart murmur, systolic 12/16/2013   Hyperlipidemia, unspecified 12/16/2013   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 [place] [address] Phone: [phone] Fax: [phone]  Social Drivers of Health (SDOH) Social History: SDOH Screenings   Food Insecurity: No Food Insecurity (08/17/2023)  Housing: Low Risk  (08/17/2023)  Transportation Needs: No Transportation Needs (08/17/2023)  Utilities: Not At Risk (08/17/2023)  Social Connections: Unknown (08/18/2023)  Tobacco Use: Medium Risk (08/17/2023)   SDOH Interventions:    Readmission Risk Interventions     No data to display

## 2023-08-18 NOTE — H&P (Addendum)
 Cardiology Consultation   Patient ID: Laurie Bowman MRN: [id number]; DOB: [birth year]  Admit date: 08/17/2023 Date of Consult: 08/18/2023  PCP:  Sheilah Denver., MD   [facility] Providers Cardiologist:  Zoe Hinds, MD        Patient Profile: Laurie Bowman is a 83 y.o. female with a hx of pAF on eliquis , non-obstructive hypertrophic cardiomyopathy, hypertension, mitral regurgitation, hyperlipidemia, and bladder cancer currently being treated (last BCG tx 08/12/23)  who is being seen 08/18/2023 for the evaluation of AF with RVR and elevated troponins at the request of hospital medicine.  History of Present Illness: Laurie Bowman is an 83 year old female with the above history who follows with [facility]. She sees Dr. Sandee Crook  for her for hypertrophic cardiomyopathy, and Dr. Carolynne Citron for her Afib. In 2023 she was admitted with atrial fibrillation with RVR and elevated troponins that were contributed to demand due to underlying hypertrophic cardiomyopathy. She was treated with amiodarone  and placed on eliqius. Of note she was also diagnosed with rhabdomyolysis at this time. In follow-up, she was unable to tolerate amiodarone  due side effects (dizzy, nausea) and was subsequently referred to Dr. Carolynne Citron who continued her on low dose verampil due to her AF burden being too low to warrant ablation at that time. Last outpatient appointment 04/04/23. At this time she was going well, no AF episodes.   Presented to the ED on 6/1 after she slid off the couch and was unable to get up, she endorses she has been having progressive weakness with N/V since her last BCG treatment for her bladder cancer.   In the ED she was found to be hyponatremic (129),  with an AKI Cr (1.46; ~1.1 baseline), BNP 459.6, and UA showing moderate RBC, leuks, and bacteria. She was stated on Levaquin. During the course of this admission she has had elevated troponins (35-> 147->287->329) and elevated CK (1770) Initial ECG sinus with LBBB  HR 86bpm  Overnight patient went into AF RVR with associated chest heaviness and shortness of breath during episode. She was given 1 dose of cardizem  that did not effect the heart rate. Patient converted to NSR before amiodarone  was fully administered. It was discontinued.  She shares that she is currently without chest pain or shortness of breath. She denies any anginal symptoms. Her last episode of symptomatic AF was her previous admission in 2023. Medication compliance with Eliquis . Denies recent fall, states she did not fall yesterday, she missed the chair and slid down and was unable to get up. Due to these cancer treatments she does endorse fatigue, weakness, and dizziness with balance issues. She has been staying on the ground floor of her home and using a walker due to these symptoms. She lives at home with her husband Ray.     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 Possibly aggravated by Tamoxifen   Fibrocystic breast changes 07/08/2011    Fracture, Col[place], left, closed 05/03/2021   GERD (gastroesophageal reflux disease) 02/06/2016   Heart murmur, systolic 12/16/2013   Overview:  Overview:  Mild MR/TR, echo 2007 without change since 2001   Hyperlipidemia LDL goal <70 12/16/2013   Hyperlipidemia, unspecified 12/16/2013   Hypertension    Hypertensive heart disease 04/30/2018   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   Hypokalemia 09/27/2020   Infection due to Staphylococcus epidermidis 09/27/2020   Irritable bowel syndrome 12/16/2013   LBBB (left bundle branch block) 04/30/2018   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   Major depressive disorder, recurrent, unspecified (HCC) 12/16/2013   Mitral regurgitation 04/30/2018   Moderate persistent asthma 09/28/2015   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 06/21/2015   Other chest pain 09/27/2020   Other specified disorders of eyelid 11/09/2015   Pain in unspecified knee 12/16/2013   Peripheral visual field defect of both eyes 04/21/2015   PVC's (premature ventricular contractions) 04/30/2018   Rotator cuff tendonitis 12/16/2013   Undiagnosed cardiac murmurs 12/16/2013   Overview:  Mild MR/TR, echo 2007 without change since 2001   Urticaria    Ventricular premature depolarization 12/16/2013   Wound dehiscence 05/08/2015    Past Surgical History:  Procedure Laterality Date   ABDOMINAL ADHESION SURGERY     ADENOIDECTOMY     BREAST SURGERY     carpel tunnel     CESAREAN SECTION     KNEE SURGERY     LEG SURGERY     bone graft   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/2022   TONSILLECTOMY     VEIN SURGERY     Right Leg     Home Medications:  Prior to Admission medications   Medication Sig Start Date End Date Taking? Authorizing Provider  acetaminophen  (TYLENOL ) 500 MG tablet Take 500 mg by mouth every 4 (four) hours as needed for moderate pain (pain score 4-6). 09/18/20  Yes [provider]   albuterol  (PROVENTIL ) (2.5 MG/3ML) 0.083% nebulizer solution Take 3 mLs (2.5 mg total) by nebulization every 6 (six) hours as needed for wheezing or shortness of breath. 06/18/21  Yes Rochester Chuck, MD  albuterol  (VENTOLIN  HFA) 108 [id number] Base) MCG/ACT inhaler USE 1-2 PUFFS EVERY 4-6 HOURS AS NEEDED FOR COUGH OR WHEEZE. 07/02/22  Yes Rochester Chuck, MD  apixaban  (ELIQUIS ) 5 MG TABS tablet Take 1 tablet (5 mg total) by mouth 2 (two) times daily. 06/19/23  Yes Tammie Fall, MD  augmented betamethasone dipropionate (DIPROLENE-AF) 0.05 % cream Apply 0.05 application  topically daily as needed (Lichen).   Yes [provider]  benzonatate  (TESSALON ) 100 MG capsule Take 100 mg by mouth daily as needed for cough. 07/02/18  Yes [provider]  buPROPion  (WELLBUTRIN  XL) 150 MG 24 hr tablet Take 150 mg by mouth daily. 12/02/21  Yes [provider]  Cholecalciferol  (VITAMIN D) 50 MCG (2000 UT) CAPS Take 2,000 Units by mouth daily.   Yes [provider]  clobetasol cream (TEMOVATE) 0.05 % Apply 1 application  topically daily as  needed (Scratches). 03/22/18  Yes [provider]  clotrimazole-betamethasone (LOTRISONE) cream Apply 1 application  topically daily as needed (Fugus). 03/16/19  Yes [provider]  diclofenac Sodium (VOLTAREN) 1 % GEL Apply 2 g topically 4 (four) times daily as needed (Pain). Both knees 04/16/19  Yes [provider]  dicyclomine  (BENTYL ) 20 MG tablet Take 20 mg by mouth daily as needed for spasms. 04/14/18  Yes [provider]  fexofenadine (ALLEGRA) 180 MG tablet Take 180 mg by mouth daily.    Yes [provider]  fluticasone  (FLOVENT  HFA) 110 MCG/ACT inhaler Use two puffs twice daily for one to two weeks during flares Patient taking differently: Inhale 2 puffs into the lungs daily as needed (Asthma). 06/26/23  Yes Rochester Chuck, MD  hydrochlorothiazide  (HYDRODIURIL ) 25 MG tablet Take 1 tablet  (25 mg total) by mouth daily. 09/04/21 08/18/23 Yes Arrien, Curlee Doss, MD  lansoprazole  (PREVACID ) 30 MG capsule Take 1 capsule (30 mg total) by mouth 2 (two) times daily before a meal. 07/10/21  Yes Rochester Chuck, MD  minoxidil (LONITEN) 2.5 MG tablet Take 1.25 mg by mouth daily. 07/16/23  Yes [provider]  mometasone  (NASONEX ) 50 MCG/ACT nasal spray Place 2 sprays into the nose daily. Patient taking differently: Place 2 sprays into the nose daily as needed (Rhinitis). 06/26/23  Yes Rochester Chuck, MD  montelukast  (SINGULAIR ) 10 MG tablet Take 1 tablet (10 mg total) by mouth at bedtime. 01/09/23  Yes Rochester Chuck, MD  olopatadine  (PATANOL) 0.1 % ophthalmic solution Place 1 drop into both eyes 2 (two) times daily as needed. 04/18/20  Yes Rochester Chuck, MD  Polyethylene Glycol 3350  (PEG 3350 ) POWD Take 17 g by mouth daily.  06/24/16  Yes [provider]  potassium chloride  (KLOR-CON ) 10 MEQ tablet Take 10-20 mEq by mouth See admin instructions. Take 10 mEq (1 tablet) by mouth every morning and 20 mEq (2 tablets) every night. 04/21/15  Yes [provider]  rosuvastatin  (CRESTOR ) 10 MG tablet Take 10 mg by mouth daily. 07/13/21  Yes [provider]  tiZANidine (ZANAFLEX) 4 MG tablet Take 2 mg by mouth at bedtime as needed (Sleep). 11/16/19  Yes [provider]  triamcinolone cream (KENALOG) 0.1 % Apply 1 application  topically 2 (two) times daily as needed (Eyes). 12/14/18  Yes [provider]  verapamil  (CALAN -SR) 180 MG CR tablet Take 180 mg by mouth daily. 08/06/23  Yes [provider]  oxybutynin (DITROPAN-XL) 10 MG 24 hr tablet Take 1 tablet by mouth daily. Patient not taking: Reported on 08/18/2023 06/30/23 09/28/23  [provider]    Scheduled Meds:  apixaban   5 mg Oral BID   buPROPion   150 mg Oral Daily   verapamil   120 mg Oral Daily   Continuous Infusions:  lactated ringers 75 mL/hr at 08/18/23  0550   [START ON 08/19/2023] levofloxacin (LEVAQUIN) IV     PRN Meds:   Allergies:    Allergies  Allergen Reactions   Cefaclor Itching    Other reaction(s): Flushing (ALLERGY/intolerance)   Codeine Itching and Nausea And Vomiting   Fructose Swelling and Nausea And Vomiting    Swelling and GI upset, Fructose Granules  Fructose Granules   Sulfa Antibiotics Swelling   Meperidine Nausea And Vomiting and Itching   Elemental Sulfur Swelling   Propoxyphene Nausea And Vomiting    Social History:   Social History   Socioeconomic History   Marital status: Married  Spouse name: Not on file   Number of children: Not on file   Years of education: Not on file   Highest education level: Not on file  Occupational History   Not on file  Tobacco Use   Smoking status: Former    Types: Cigarettes    Passive exposure: Past   Smokeless tobacco: Never  Vaping Use   Vaping status: Never Used  Substance and Sexual Activity   Alcohol use: No    Alcohol/week: 0.0 standard drinks of alcohol   Drug use: No   Sexual activity: Never  Other Topics Concern   Not on file  Social History Narrative   Not on file   Social Drivers of Health   Financial Resource Strain: Not on file  Food Insecurity: No Food Insecurity (08/17/2023)   Hunger Vital Sign    Worried About Running Out of Food in the Last Year: Never true    Ran Out of Food in the Last Year: Never true  Transportation Needs: No Transportation Needs (08/17/2023)   PRAPARE - Administrator, Civil Service (Medical): No    Lack of Transportation (Non-Medical): No  Physical Activity: Not on file  Stress: Not on file  Social Connections: Not on file  Intimate Partner Violence: Not At Risk (08/17/2023)   Humiliation, Afraid, Rape, and Kick questionnaire    Fear of Current or Ex-Partner: No    Emotionally Abused: No    Physically Abused: No    Sexually Abused: No    Family History:    Family History  Problem Relation Age of  Onset   Congestive Heart Failure Mother    Hypertension Mother    Hyperlipidemia Sister    Dementia Sister    Alzheimer's disease Sister    Stroke Maternal Grandmother    Breast cancer Sister    Hyperlipidemia Sister    Allergic rhinitis Neg Hx    Angioedema Neg Hx    Asthma Neg Hx    Eczema Neg Hx    Immunodeficiency Neg Hx    Urticaria Neg Hx    Atopy Neg Hx      ROS:  Please see the history of present illness.   All other ROS reviewed and negative.     Physical Exam/Data: Vitals:   08/17/23 2115 08/17/23 2133 08/17/23 2224 08/18/23 0246  BP: 128/71  117/62 (!) 100/52  Pulse: 88  83 70  Resp: (!) 28  20 18   Temp:  98.8 F (37.1 C) 100 F (37.8 C) 98.3 F (36.8 C)  TempSrc:  Oral Oral Oral  SpO2: 99%  95% 99%  Weight:      Height:   5' (1.524 m)     Intake/Output Summary (Last 24 hours) at 08/18/2023 1128 Last data filed at 08/18/2023 0550 Gross per 24 hour  Intake 582.84 ml  Output --  Net 582.84 ml      08/17/2023    3:32 PM 06/26/2023   10:00 AM 04/04/2023   10:11 AM  Last 3 Weights  Weight (lbs) 138 lb 145 lb 144 lb  Weight (kg) 62.596 kg 65.772 kg 65.318 kg     Body mass index is 26.95 kg/m.  General:  Fragile, in no acute distress HEENT: normal Neck: no JVD Vascular:  Distal pulses 2+ right radial, 1+ left radial Cardiac:  systolic murmur 3/6 RRR;  Lungs:  clear to auscultation bilaterally, no wheezing, rhonchi or rales  Abd: soft, tenderness to RLQ Ext: no edema Musculoskeletal:  No deformities Skin: warm and dry  Neuro:  CNs 2-12 intact, no focal abnormalities noted Psych:  Normal affect   EKG:  The EKG was personally reviewed and demonstrates:  see HPI. Telemetry:  Telemetry was personally reviewed and demonstrates:  Sinus LBBB with infrequent PVCs  Relevant CV Studies:   Laboratory Data: High Sensitivity Troponin:   Recent Labs  Lab 08/17/23 1612 08/17/23 2025 08/17/23 2303 08/18/23 0034  TROPONINIHS 35* 147* 287* 329*      Chemistry Recent Labs  Lab 08/17/23 1612 08/18/23 0034  NA 129* 127*  K 3.6 3.4*  CL 95* 97*  CO2 21* 21*  GLUCOSE 94 97  BUN 24* 24*  CREATININE 1.46* 1.43*  CALCIUM  9.5 8.6*  GFRNONAA 36* 37*  ANIONGAP 13 9    Recent Labs  Lab 08/17/23 1612 08/18/23 0034  PROT 7.0 5.9*  ALBUMIN 3.6 2.9*  AST 36 60*  ALT 23 26  ALKPHOS 55 47  BILITOT 2.5* 1.8*   Lipids No results for input(s): "CHOL", "TRIG", "HDL", "LABVLDL", "LDLCALC", "CHOLHDL" in the last 168 hours.  Hematology Recent Labs  Lab 08/17/23 1612 08/18/23 0034  WBC 11.2* 9.6  RBC 4.14 3.61*  HGB 13.1 11.6*  HCT 39.5 35.2*  MCV 95.4 97.5  MCH 31.6 32.1  MCHC 33.2 33.0  RDW 13.2 13.2  PLT 178 158   Thyroid  Recent Labs  Lab 08/17/23 2303  TSH 3.345    BNP Recent Labs  Lab 08/17/23 2025  BNP 459.6*    DDimer No results for input(s): "DDIMER" in the last 168 hours.  Radiology/Studies:  CT ABDOMEN PELVIS W CONTRAST Result Date: 08/17/2023 EXAM: CT ABDOMEN AND PELVIS WITH CONTRAST 08/17/2023 08:49:42 PM TECHNIQUE: CT of the abdomen and pelvis was performed with the administration of intravenous contrast. Multiplanar reformatted images are provided for review. Automated exposure control, iterative reconstruction, and/or weight based adjustment of the mA/kV was utilized to reduce the radiation dose to as low as reasonably achievable. COMPARISON: 03/02/2023 CLINICAL HISTORY: Abdominal pain, acute, nonlocalized. Table formatting from the original note was not included. Images from the original note were not included. Pt. BIBEMS from home w/ c/o a fall. No LOC. Pt is on eliquis  BID. Pt. C/o pain and tenderness to her left elbow. Pt. States she had first txt for bladder cancer last Tuesday and has been feeling weak and has had chills since. Per EMS stroke screen; negative. FINDINGS: LOWER CHEST: Mild subpleural reticulation/fibrosis at the lung bases. LIVER: The liver is unremarkable. GALLBLADDER AND BILE DUCTS:  Gallbladder is unremarkable. No biliary ductal dilatation. SPLEEN: No acute abnormality. PANCREAS: No acute abnormality. ADRENAL GLANDS: No acute abnormality. KIDNEYS, URETERS AND BLADDER: Simple left upper pole renal cyst measuring up to 18 mm, benign. No follow-up is recommended. Mild diminished/delayed enhancement of the right kidney with associated mild right hydroureteronephrosis, although without a visualized ureteral or bladder calculus. Differential consideration to include recent passage of a ureteral calculus or ascending infection. Trace nondependent gas in the bladder with subtle perivesical stranding, correlate for cystitis. GI AND BOWEL: The appendix is not discretely visualized. PERITONEUM AND RETROPERITONEUM: No ascites. No free air. VASCULATURE: Atherosclerotic calcifications of the abdominal aorta and branch vessels. LYMPH NODES: No lymphadenopathy. REPRODUCTIVE ORGANS: No acute abnormality. BONES AND SOFT TISSUES: No acute osseous abnormality. No focal soft tissue abnormality. IMPRESSION: 1. Trace nondependent gas in the bladder and subtle perivesical stranding, correlate for cystitis. 2. Mild diminished/delayed enhancement of the right kidney with associated mild right hydroureteronephrosis. Differential considerations  include recent passage of a ureteral calculus or ascending infection. Electronically signed by: Zadie Herter MD 08/17/2023 08:57 PM EDT RP Workstation: [id number]   DG Chest Port 1 View Result Date: 08/17/2023 CLINICAL DATA:  Pain. Fall. History of recent treatment for bladder cancer. Weakness and chills. EXAM: PORTABLE CHEST 1 VIEW COMPARISON:  07/24/2022 FINDINGS: Cardiac enlargement with mild pulmonary vascular congestion. Perihilar and basilar infiltrates are new since prior study, likely edema. Pneumonia would also be a possibility. No pleural effusion or pneumothorax. Mediastinal contours appear intact. Calcification of the aorta. IMPRESSION: Cardiac enlargement with  pulmonary vascular congestion and bilateral pulmonary infiltrates. Findings are new since prior study. Electronically Signed   By: Boyce Byes M.D.   On: 08/17/2023 19:41   DG Elbow Complete Left Result Date: 08/17/2023 CLINICAL DATA:  Pain after a fall. Left posterior elbow pain after a fall today. EXAM: LEFT ELBOW - COMPLETE 3+ VIEW COMPARISON:  Left forearm 08/27/2021 FINDINGS: Degenerative changes in the left elbow. No evidence of acute fracture or dislocation. No focal bone lesion or bone destruction. Bone cortex appears intact. No significant effusions. Soft tissue defect over the olecranon region consistent with laceration. No radiopaque soft tissue foreign bodies. Soft tissue calcification projected over the forearm, likely dystrophic. IMPRESSION: No acute bony abnormalities. Soft tissue defect over the olecranon region consistent with laceration. Electronically Signed   By: Boyce Byes M.D.   On: 08/17/2023 17:29     Assessment and Plan: Type II NSTEMI Troponin trend (35->147->287->329) CTA in 2022 showed coronary atherosclerosis of LAD. Endorses no chest pain outside of AF episode. Denies anginal symptoms.Could consider a CCTA outpatient for further risk stratification depending on echo results.  Patient had similar admission in 2023 with AF RVR with induced troponin elevation due to her underlying hypertrophic cardiomyopathy, most likely the etiology of current troponin elevations this admission. Similar to last episode she is currently admitted with AKI and elevated CK most likely contributing to troponin level. -echo pending  AF with RVR She went into AF RVR (HR 157) overnight, she was given 1 dose of cardizem  that did not effect the heart rate.She then converted to NSR before full dose of amiodarone  was administered, it was subsequently discontinued. Denies symptoms today. She has not had any symptomatic AF episodes since 2023 and follows with Dr. Carolynne Citron outpatient. She does have  a history of not tolerating amiodarone  in the past due to side effect profile.  She is currently in NSR now, will continue with home medications and monitor.  -continue 120 mg verapamil   -continue eliquis  5mg  BID. Caution with current AKI, patient is >80 and her current Cr. 1.43, continue to watch kidney function to see if dose reduction is warranted.    Hypertrophic cardiomyopathy  BNP 459.6. Patient denies shortness of breath, orthopnea, PND, or peripheral edema. She has followed with Dr. Sandee Crook in the past without complications. Euvolemic on exam.  -echo pending -continue 120mg  verapamil   MR-mild Echo pending. Asymptomatic, will continue to monitor  Hypertension Has been normotensive off of home medications during admission. Would continue to hold home medications until AKI/hyponatremia improves.  -hold hydrochlorothiazide  25 mg   AKI with hyponatremia Patient has had poor intake due to nausea and emesis. Continue to monitor Na/ Cr.  Baseline ~ 1.1 Continue Na repletion, continue to watch volume status with hypertrophic cardiomyopathy.  Hyperlipidemia Continue to hold crestor  10mg  until CK and kidney function improves.   Per primary 6. Rhabdomyolysis 7. Bladder cancer 8. Depression 9. UTI  Risk Assessment/Risk Scores:    TIMI Risk Score for Unstable Angina or Non-ST Elevation MI:   The patient's TIMI risk score is  , which indicates a  % risk of all cause mortality, new or recurrent myocardial infarction or need for urgent revascularization in the next 14 days.    CHA2DS2-VASc Score = 4   This indicates a 4.8% annual risk of stroke. The patient's score is based upon: CHF History: 0 HTN History: 1 Diabetes History: 0 Stroke History: 0 Vascular Disease History: 0 Age Score: 2 Gender Score: 1   For questions or updates, please contact [facility] Please consult [email or web address] for contact info under   Signed, Mabel Savage, PA-C  08/18/2023 11:28  AM  Patient seen and examined, note reviewed with the signed Advanced Practice Provider. I personally reviewed laboratory data, imaging studies and relevant notes. I independently examined the patient and formulated the important aspects of the plan. I have personally discussed the plan with the patient and/or family. Comments or changes to the note/plan are indicated below.  NSTEMI-type II suspect demand ischemia in the setting of A-fib RVR currently she does not have any anginal symptoms will not pursue an ischemic evaluation at this time.  Of note she also does have rhabdomyolysis which can be a contributing factor.  Echocardiogram pending if there are noted wall motion abnormality we will reassess and make appropriate clinical recommendations.  Paroxysmal atrial fibrillation-back in sinus rhythm she has been restarted on her verapamil  we will continue this.  Continue anticoagulation with current dose of Eliquis .  Does not need to be dose adjusted at this time.  Will continue to monitor kidney function  Hypertrophic cardiomyopathy-follows with Dr. Sandee Crook.  Thankfully clinically she does not appear to be volume overloaded.  Continue verapamil .  Ayani Ospina DO, MS [facility] Attending Cardiologist [facility]  [address] [place], [place] [address] [phone] Website: [email or web address]

## 2023-08-19 ENCOUNTER — Encounter (HOSPITAL_COMMUNITY)
Admission: EM | Disposition: A | Discharge status: Skilled Nursing Facility | Payer: Self-pay | Source: Home / Self Care | Attending: Internal Medicine

## 2023-08-19 ENCOUNTER — Inpatient Hospital Stay (HOSPITAL_COMMUNITY): Admitting: Anesthesiology

## 2023-08-19 ENCOUNTER — Encounter (HOSPITAL_COMMUNITY): Payer: Self-pay | Admitting: Internal Medicine

## 2023-08-19 DIAGNOSIS — N390 Urinary tract infection, site not specified: Secondary | ICD-10-CM

## 2023-08-19 DIAGNOSIS — I480 Paroxysmal atrial fibrillation: Secondary | ICD-10-CM | POA: Diagnosis not present

## 2023-08-19 LAB — CBC WITH DIFFERENTIAL/PLATELET
Abs Immature Granulocytes: 0.04 10*3/uL (ref 0.00–0.07)
Basophils Absolute: 0 10*3/uL (ref 0.0–0.1)
Basophils Relative: 1 %
Eosinophils Absolute: 0 10*3/uL (ref 0.0–0.5)
Eosinophils Relative: 0 %
HCT: 33.5 % — ABNORMAL LOW (ref 36.0–46.0)
Hemoglobin: 11 g/dL — ABNORMAL LOW (ref 12.0–15.0)
Immature Granulocytes: 1 %
Lymphocytes Relative: 9 %
Lymphs Abs: 0.5 10*3/uL — ABNORMAL LOW (ref 0.7–4.0)
MCH: 32.1 pg (ref 26.0–34.0)
MCHC: 32.8 g/dL (ref 30.0–36.0)
MCV: 97.7 fL (ref 80.0–100.0)
Monocytes Absolute: 0.5 10*3/uL (ref 0.1–1.0)
Monocytes Relative: 9 %
Neutro Abs: 4.3 10*3/uL (ref 1.7–7.7)
Neutrophils Relative %: 80 %
Platelets: 148 10*3/uL — ABNORMAL LOW (ref 150–400)
RBC: 3.43 MIL/uL — ABNORMAL LOW (ref 3.87–5.11)
RDW: 13.2 % (ref 11.5–15.5)
WBC: 5.4 10*3/uL (ref 4.0–10.5)
nRBC: 0 % (ref 0.0–0.2)

## 2023-08-19 LAB — PHOSPHORUS: Phosphorus: 3.2 mg/dL (ref 2.5–4.6)

## 2023-08-19 LAB — COMPREHENSIVE METABOLIC PANEL WITH GFR
ALT: 38 U/L (ref 0–44)
AST: 78 U/L — ABNORMAL HIGH (ref 15–41)
Albumin: 2.7 g/dL — ABNORMAL LOW (ref 3.5–5.0)
Alkaline Phosphatase: 44 U/L (ref 38–126)
Anion gap: 8 (ref 5–15)
BUN: 23 mg/dL (ref 8–23)
CO2: 22 mmol/L (ref 22–32)
Calcium: 8.7 mg/dL — ABNORMAL LOW (ref 8.9–10.3)
Chloride: 100 mmol/L (ref 98–111)
Creatinine, Ser: 1.18 mg/dL — ABNORMAL HIGH (ref 0.44–1.00)
GFR, Estimated: 46 mL/min — ABNORMAL LOW (ref >60)
Glucose, Bld: 97 mg/dL (ref 70–99)
Potassium: 3.3 mmol/L — ABNORMAL LOW (ref 3.5–5.1)
Sodium: 130 mmol/L — ABNORMAL LOW (ref 135–145)
Total Bilirubin: 1.6 mg/dL — ABNORMAL HIGH (ref 0.0–1.2)
Total Protein: 5.5 g/dL — ABNORMAL LOW (ref 6.5–8.1)

## 2023-08-19 LAB — URINE CULTURE: Culture: >=100000 — AB

## 2023-08-19 LAB — MAGNESIUM: Magnesium: 1.9 mg/dL (ref 1.7–2.4)

## 2023-08-19 LAB — GLUCOSE, CAPILLARY: Glucose-Capillary: 126 mg/dL — ABNORMAL HIGH (ref 70–99)

## 2023-08-19 SURGERY — CYSTOSCOPY, WITH STENT INSERTION
Anesthesia: General | Laterality: Right

## 2023-08-19 MED ORDER — SODIUM CHLORIDE 0.9 % IV BOLUS
250.0000 mL | Freq: Once | INTRAVENOUS | Status: AC
  Administered 2023-08-19: 250 mL via INTRAVENOUS

## 2023-08-19 MED ORDER — CEFAZOLIN SODIUM-DEXTROSE 2-4 GM/100ML-% IV SOLN
INTRAVENOUS | Status: AC
  Filled 2023-08-19: qty 100

## 2023-08-19 MED ORDER — PROPOFOL 10 MG/ML IV BOLUS
INTRAVENOUS | Status: AC
  Filled 2023-08-19: qty 20

## 2023-08-19 MED ORDER — AMIODARONE LOAD VIA INFUSION
150.0000 mg | Freq: Once | INTRAVENOUS | Status: AC
  Administered 2023-08-19: 150 mg via INTRAVENOUS
  Filled 2023-08-19: qty 83.34

## 2023-08-19 MED ORDER — CHLORHEXIDINE GLUCONATE 0.12 % MT SOLN: 15.0000 mL | Freq: Once | OROMUCOSAL | Status: DC

## 2023-08-19 MED ORDER — ESMOLOL HCL 100 MG/10ML IV SOLN
INTRAVENOUS | Status: DC | PRN
  Administered 2023-08-19: 20 mg via INTRAVENOUS

## 2023-08-19 MED ORDER — POTASSIUM CHLORIDE CRYS ER 20 MEQ PO TBCR
40.0000 meq | EXTENDED_RELEASE_TABLET | Freq: Once | ORAL | Status: AC
  Administered 2023-08-19: 40 meq via ORAL
  Filled 2023-08-19: qty 2

## 2023-08-19 MED ORDER — CEFAZOLIN SODIUM-DEXTROSE 2-4 GM/100ML-% IV SOLN: 2.0000 g | Freq: Once | INTRAVENOUS | Status: DC

## 2023-08-19 MED ORDER — AMIODARONE HCL IN DEXTROSE 360-4.14 MG/200ML-% IV SOLN
30.0000 mg/h | INTRAVENOUS | Status: DC
  Administered 2023-08-19 – 2023-08-20 (×2): 30 mg/h via INTRAVENOUS
  Filled 2023-08-19 (×2): qty 200

## 2023-08-19 MED ORDER — SODIUM CHLORIDE 0.9 % IV SOLN: INTRAVENOUS | Status: DC | PRN

## 2023-08-19 MED ORDER — APIXABAN 5 MG PO TABS
5.0000 mg | ORAL_TABLET | Freq: Two times a day (BID) | ORAL | Status: DC
  Administered 2023-08-19 – 2023-08-20 (×3): 5 mg via ORAL
  Filled 2023-08-19 (×3): qty 1

## 2023-08-19 MED ORDER — ACETAMINOPHEN 325 MG PO TABS
650.0000 mg | ORAL_TABLET | Freq: Four times a day (QID) | ORAL | Status: DC | PRN
  Administered 2023-08-19 – 2023-08-22 (×3): 650 mg via ORAL
  Filled 2023-08-19 (×4): qty 2

## 2023-08-19 MED ORDER — DILTIAZEM HCL-DEXTROSE 125-5 MG/125ML-% IV SOLN (PREMIX)
5.0000 mg/h | INTRAVENOUS | Status: DC
  Filled 2023-08-19: qty 125

## 2023-08-19 MED ORDER — PHENYLEPHRINE HCL-NACL 20-0.9 MG/250ML-% IV SOLN
INTRAVENOUS | Status: AC
  Filled 2023-08-19: qty 250

## 2023-08-19 MED ORDER — METOPROLOL TARTRATE 5 MG/5ML IV SOLN
INTRAVENOUS | Status: AC
Start: 2023-08-19 — End: ?
  Filled 2023-08-19: qty 5

## 2023-08-19 MED ORDER — AMIODARONE HCL IN DEXTROSE 360-4.14 MG/200ML-% IV SOLN
60.0000 mg/h | INTRAVENOUS | Status: DC
  Administered 2023-08-19: 60 mg/h via INTRAVENOUS
  Filled 2023-08-19: qty 200

## 2023-08-19 MED ORDER — PANTOPRAZOLE SODIUM 40 MG PO TBEC
40.0000 mg | DELAYED_RELEASE_TABLET | Freq: Every day | ORAL | Status: DC
  Administered 2023-08-19 – 2023-08-28 (×10): 40 mg via ORAL
  Filled 2023-08-19 (×9): qty 1

## 2023-08-19 MED ORDER — FENTANYL CITRATE PF 50 MCG/ML IJ SOSY: 25.0000 ug | PREFILLED_SYRINGE | INTRAMUSCULAR | Status: DC | PRN

## 2023-08-19 MED ORDER — KETOROLAC TROMETHAMINE 15 MG/ML IJ SOLN: 15.0000 mg | Freq: Once | INTRAMUSCULAR | Status: DC

## 2023-08-19 MED ORDER — AMOXICILLIN-POT CLAVULANATE 875-125 MG PO TABS
1.0000 | ORAL_TABLET | Freq: Two times a day (BID) | ORAL | Status: DC
  Administered 2023-08-19 – 2023-08-25 (×13): 1 via ORAL
  Filled 2023-08-19 (×13): qty 1

## 2023-08-19 MED ORDER — FENTANYL CITRATE (PF) 100 MCG/2ML IJ SOLN
INTRAMUSCULAR | Status: AC
  Filled 2023-08-19: qty 2

## 2023-08-19 MED ORDER — FENTANYL CITRATE (PF) 100 MCG/2ML IJ SOLN
INTRAMUSCULAR | Status: DC | PRN
  Administered 2023-08-19: 50 ug via INTRAVENOUS

## 2023-08-19 MED ORDER — PHENYLEPHRINE 80 MCG/ML (10ML) SYRINGE FOR IV PUSH (FOR BLOOD PRESSURE SUPPORT)
PREFILLED_SYRINGE | INTRAVENOUS | Status: AC
  Filled 2023-08-19: qty 10

## 2023-08-19 MED ORDER — ESMOLOL HCL 100 MG/10ML IV SOLN
INTRAVENOUS | Status: AC
  Filled 2023-08-19: qty 10

## 2023-08-19 NOTE — Progress Notes (Signed)
 Progress Note  Patient Name: Laurie Bowman Date of Encounter: 08/19/2023 [facility] Cardiologist: Zoe Hinds, MD   Interval Summary   Patient went back into AF RVR prior to her ureteral stent placement for hydronephrosis that has now been postponed. Her HR was in the 170s. At that time she was endorsing chest pain, nausea, and diaphoresis. No shortness of breath or lightheadedness.   She has now been loaded and on an amiodarone  drip. Given her PTA medication 120 mg verapamil  and 20 mg IV esmolol. During interview patient HR was 130-140 with BP around 106/60. She stated she felt better, no chest pain or nausea. Still mildly diaphoretic.  Her Eliquis  has NOT been held for the procedure. Per urology they would like to procedure with her ureteral stent placement as soon as possible  Vital Signs Vitals:   08/19/23 0800 08/19/23 0811 08/19/23 0815 08/19/23 0830  BP: (!) 123/93 (!) 117/91 (!) 123/93 102/76  Pulse: (!) 132  (!) 166 (!) 141  Resp: (!) 26  (!) 26 (!) 28  Temp:      TempSrc:      SpO2: 92%  97% 96%  Weight:      Height:        Intake/Output Summary (Last 24 hours) at 08/19/2023 0914 Last data filed at 08/19/2023 0529 Gross per 24 hour  Intake 2242.21 ml  Output 1000 ml  Net 1242.21 ml      08/17/2023    3:32 PM 06/26/2023   10:00 AM 04/04/2023   10:11 AM  Last 3 Weights  Weight (lbs) 138 lb 145 lb 144 lb  Weight (kg) 62.596 kg 65.772 kg 65.318 kg      Telemetry/ECG  AF with HR 130-150 - Personally Reviewed  Physical Exam  GEN: No acute distress. Mildly diaphoretic  Neck: No JVD Cardiac: Irregular rhythm, tachycardiac systolic mumur 3/6 no rubs or gallops.  Respiratory: Breathing comfortably on room air GI: Soft, non-distended, tender to RLQ MS: No edema  Echo 08/18/23 IMPRESSIONS    1. LV outflow tract peak gradient at rest 138 mmHg. Left ventricular  ejection fraction, by estimation, is 50 to 55%. The left ventricle has low  normal function. The  left ventricle has no regional wall motion  abnormalities but there is septal-lateral  dyssynchrony consistent with LBBB. There is moderate asymmetric left  ventricular hypertrophy of the septal segment. Left ventricular diastolic  parameters are consistent with Grade I diastolic dysfunction (impaired  relaxation).   2. Right ventricular systolic function is normal. The right ventricular  size is normal. There is moderately elevated pulmonary artery systolic  pressure. The estimated right ventricular systolic pressure is 55.6 mmHg.   3. Left atrial size was moderately dilated.   4. Mitral valve systolic anterior motion is present. The mitral valve is  abnormal. Moderate eccentric mitral valve regurgitation. No evidence of  mitral stenosis.   5. The aortic valve is tricuspid. There is mild calcification of the  aortic valve. Aortic valve regurgitation is not visualized. No aortic  stenosis is present.   6. The inferior vena cava is normal in size with <50% respiratory  variability, suggesting right atrial pressure of 8 mmHg.   7. 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   Assessment & Plan  AF with RVR Second instance of patient going into AF RVR this admission. First instance, she was given 1 dose of cardizem  that did not effect the heart rate.She then converted to NSR before amiodarone  was administered, it was  subsequently discontinued. She converted back to AF RVR with HR 170 in PACU prior to her ureteral stent placement. She endorsed chest pain, nausea, and diaphoresis with his heart rate.   She has now been placed on amiodarone  drip, given 120 mg verapamil , and IV 20 mg esmolol with HR improved to 130-140. She denies chest pain and nausea now. Still mildly diaphoretic. No shortness of breath or lightheadedness.  Due to her underlying hypertrophic cardiomyopathy, she does not tolerate being in AF RVR. She did have evidence of microscopic hematuria on admission, hgb has remained stable around  11. She has not missed any doses of eliquis  for upcoming procedure, however there was some discrepancy during admission where 1 dose may have been missed. Will continue amiodarone  for now for HR control. If she fails HR control, may need to pursue TEE/ DCCV however this is not ideal given need for urology procedure.   She has not had any symptomatic AF episodes since 2023 and follows with Dr. Carolynne Citron outpatient. She does have a history of not tolerating amiodarone  in the past due to side effect profile.  -continue amiodarone  drip -continue IV fluids, caution continue to watch volume status with hypertrophic cardiomyopathy. -continue eliquis  5mg  BID, confirmed with Dr.Herrick with urology that it is okay to continue with upcoming urology procedure  -continue PTA 120 mg verapamil     2.Type II 2.NSTEMI Troponin trend (35->147->287->329) CTA in 2022 showed coronary atherosclerosis of LAD. Endorses no chest pain outside of AF episode. Denies anginal symptoms.Could consider a CCTA outpatient for further risk stratification depending on echo results.  Patient had similar admission in 2023 with AF RVR with induced troponin elevation due to her underlying hypertrophic cardiomyopathy, most likely the etiology of current troponin elevations this admission. Similar to last episode she is currently admitted with AKI and elevated CK most likely contributing to troponin level. Echo 08/18/23 showed no regional wall motion abnormalities.  Continue amiodarone  as she has not tolerated AF RVR with her hypertrophic cardiomyopathy.  -continue PTA 120 mg verapamil    Hypertrophic cardiomyopathy  BNP 459.6. Patient denies shortness of breath, orthopnea, PND, or peripheral edema. She has followed with Dr. Sandee Crook in the past without complications. Euvolemic on exam.   Echo 08/18/23 showed LV outflow gradient at rest 138 mmHg. EF 50-55% with dyssynchrony consistent with LBBB. Grade 1 Diastolic dysfunction. This is similar to  prior echo 09/01/21 -continue PTA 120mg  verapamil     MR-moderate Echo 08/18/23 showed moderate eccentric mitral regurgitation without evidence of MS.  Asymptomatic, will continue to monitor   Hypertension Has been normotensive off of home medications during admission. Would continue to hold home medications.  -hold hydrochlorothiazide  25 mg    AKI with hyponatremia Patient has had poor intake due to nausea and emesis. Continue to monitor Na/ Cr.  Baseline ~ 1.1.  Today (08/19/23) Cr has returned to baseline 1.18 and Na 130. Continue Na repletion, continue to watch volume status with hypertrophic cardiomyopathy.   Hyperlipidemia Continue to hold crestor  10mg  until CK improves    Per primary 6. Rhabdomyolysis 7. Bladder cancer 8. Depression 9. UTI  For questions or updates, please contact [facility] Please consult [email or web address] for contact info under       Signed, Mabel Savage, PA-C

## 2023-08-19 NOTE — Progress Notes (Signed)
 Patient belongings sent to ICU.

## 2023-08-19 NOTE — Progress Notes (Addendum)
 PT Cancellation Note  Patient Details Name: Laurie Bowman MRN: [id number] DOB: 07-09-1940   Cancelled Treatment:    Reason Eval/Treat Not Completed: Patient at procedure or test/unavailable. Per note by urology, pt in afib and procedure postponed. Transfer to ICU. Will hold PT today and check back another day.    Tanda Falter, PT Acute Rehabilitation  Office: [phone]

## 2023-08-19 NOTE — Progress Notes (Signed)
 Pt resting comfortable. No complaints of pain at this time. Awaiting plan of care.

## 2023-08-19 NOTE — Progress Notes (Addendum)
 PROGRESS NOTE    Laurie Bowman  ZOX:[id number] DOB: [birth year] DOA: 08/17/2023 PCP: Sheilah Denver., MD  Chief Complaint  Patient presents with   Fatigue    Fall    Brief Narrative:    Laurie Bowman is Laurie Bowman 83 y.o. female with history of hypertrophic cardiomyopathy, paroxysmal atrial fibrillation, hyperlipidemia, recently diagnosed bladder cancer underwent BCG treatment last week at [facility] since then has been feeling weak tired fatigued poor appetite and also has been having increasing pain on micturition.  Had subjective feeling of fever chills.  Patient felt so weak today that she slid out of the bed did not hit her head or lose consciousness.   She's been admitted with general malaise, AKI.  CT with findings possibly concerning for Laurie Bowman UTI.  Cardiology following for afib with RVR, elevated troponin.  Urology planning for stent.      See below for additional details.  Assessment & Plan:   Principal Problem:   ARF (acute renal failure) (HCC) Active Problems:   Rhabdomyolysis   Hypertension   Hypertrophic cardiomyopathy (HCC)   Allergic rhinitis   Atrial fibrillation with RVR (HCC)   Elevated troponin   UTI (urinary tract infection)   Hyponatremia   Generalized weakness  UTI  Possible Pyelonephritis Right Sided Hydronephrosis Generalized Malaise  CT with trace gas in bladder and subtle perivesical stranding, mild right hydroureteronephrosis Pansensitive e. Coli on urine culture Blood cultures pending Will discuss with pharmacy to narrow abx with her cephalosporin allergy Urology planning for stent once stable from cardiovascular/atrial fibrillation standpoint  Laurie Bowman with RVR  RVR prior to procedure TSH wnl Continue verapamil  Eliquis   Amiodarone    Acute renal failure with hyponatremia likely from poor oral intake and dehydration. CT with mild R hydroureteronephrosis (possibly with recent passage of stone or infection) Continue IVF (holding thiazide for  now)  Acute Metabolic Encephalopathy  Suspect due to above  Seems to be improving Delirium   Elevated troponin could be demand from Laurie Bowman-fib with RVR.  Denies any chest pain.   Troponins trending up Follow echo for [name] Appreciate cardiology assistance - suspected demand Continue Eliquis  and verapamil .    Mild rhabdomyolysis  Elevated CK  could be from fall +/- poor PO intake   Holding statin UA concerning for myoglobinuria with moderate Hb and 0-5 RBC's Continue IVF  Bladder cancer S/p TURBT 06/2023, intravesicular BCG last week   Hyperlipidemia hold statins until CK levels improved. Repeat in AM  Hypertension Verapamil  Holding thiazide, holding minoxidil   Hypertrophic cardiomyopathy  Caution with volume   Depression on Wellbutrin .    DVT prophylaxis: eliquis  Code Status: ful Family Communication: none - husband 6/2.  No answer 6/3. Disposition:   Status is: Inpatient Remains inpatient appropriate because: need for ongoing care   Consultants:  Cardiology urology  Procedures:  none  Antimicrobials:  Anti-infectives (From admission, onward)    Start     Dose/Rate Route Frequency Ordered Stop   08/19/23 1800  levofloxacin (LEVAQUIN) IVPB 750 mg  Status:  Discontinued        750 mg 100 mL/hr over 90 Minutes Intravenous Every 48 hours 08/17/23 2305 08/18/23 0310   08/19/23 1800  levofloxacin (LEVAQUIN) IVPB 500 mg        500 mg 100 mL/hr over 60 Minutes Intravenous Every 48 hours 08/18/23 0310     08/19/23 0730  ceFAZolin (ANCEF) IVPB 2g/100 mL premix  Status:  Discontinued        2 g 200 mL/hr over  30 Minutes Intravenous  Once 08/19/23 0724 08/19/23 1300   08/19/23 0704  ceFAZolin (ANCEF) 2-4 GM/100ML-% IVPB  Status:  Discontinued       Note to Pharmacy: Laurie Bowman W: cabinet override      08/19/23 0704 08/19/23 1241   08/17/23 1815  levofloxacin (LEVAQUIN) IVPB 500 mg        500 mg 100 mL/hr over 60 Minutes Intravenous  Once 08/17/23 1808 08/17/23  1919       Subjective: Feels better than earlier  Objective: Vitals:   08/19/23 1155 08/19/23 1217 08/19/23 1233 08/19/23 1304  BP: (!) 97/59 108/63 100/60 (!) 109/54  Pulse: 66 66 60 64  Resp: (!) 21 20 (!) 21 20  Temp:    98.1 F (36.7 C)  TempSrc:    Oral  SpO2: 91% 93% 94% 96%  Weight:      Height:        Intake/Output Summary (Last 24 hours) at 08/19/2023 1506 Last data filed at 08/19/2023 0529 Gross per 24 hour  Intake 2122.21 ml  Output 1000 ml  Net 1122.21 ml   Filed Weights   08/17/23 1532  Weight: 62.6 kg    Examination:  General: No acute distress. Cardiovascular: RRR Lungs: unlabored Abdomen: mild R sided abdominal discomfort Neurological: Alert and oriented 3. Moves all extremities 4 with equal strength. Cranial nerves II through XII grossly intact. Extremities: No clubbing or cyanosis. No edema.  Data Reviewed: I have personally reviewed following labs and imaging studies  CBC: Recent Labs  Lab 08/17/23 1612 08/18/23 0034 08/19/23 0358  WBC 11.2* 9.6 5.4  NEUTROABS  --  8.0* 4.3  HGB 13.1 11.6* 11.0*  HCT 39.5 35.2* 33.5*  MCV 95.4 97.5 97.7  PLT 178 158 148*    Basic Metabolic Panel: Recent Labs  Lab 08/17/23 1612 08/18/23 0034 08/19/23 0358  NA 129* 127* 130*  K 3.6 3.4* 3.3*  CL 95* 97* 100  CO2 21* 21* 22  GLUCOSE 94 97 97  BUN 24* 24* 23  CREATININE 1.46* 1.43* 1.18*  CALCIUM  9.5 8.6* 8.7*  MG  --   --  1.9  PHOS  --   --  3.2    GFR: Estimated Creatinine Clearance: 30.9 mL/min (Laurie Bowman) (by C-G formula based on SCr of 1.18 mg/dL (H)).  Liver Function Tests: Recent Labs  Lab 08/17/23 1612 08/18/23 0034 08/19/23 0358  AST 36 60* 78*  ALT 23 26 38  ALKPHOS 55 47 44  BILITOT 2.5* 1.8* 1.6*  PROT 7.0 5.9* 5.5*  ALBUMIN 3.6 2.9* 2.7*    CBG: No results for input(s): "GLUCAP" in the last 168 hours.   Recent Results (from the past 240 hours)  Urine Culture     Status: Abnormal   Collection Time: 08/17/23  3:45 PM    Specimen: Urine, Clean Catch  Result Value Ref Range Status   Specimen Description   Final    URINE, CLEAN CATCH Performed at [facility], [address], [place], [place] [address]    Special Requests   Final    NONE Performed at [facility] [address] [place], [place] [address]    Culture >=100,000 COLONIES/mL ESCHERICHIA COLI (Ed Rayson)  Final   Report Status 08/19/2023 FINAL  Final   Organism ID, Bacteria ESCHERICHIA COLI (Makinsey Pepitone)  Final      Susceptibility   Escherichia coli - MIC*    AMPICILLIN 4 SENSITIVE Sensitive     CEFAZOLIN <=4 SENSITIVE  Sensitive     CEFEPIME <=0.12 SENSITIVE Sensitive     CEFTRIAXONE <=0.25 SENSITIVE Sensitive     CIPROFLOXACIN <=0.25 SENSITIVE Sensitive     GENTAMICIN <=1 SENSITIVE Sensitive     IMIPENEM <=0.25 SENSITIVE Sensitive     NITROFURANTOIN  <=16 SENSITIVE Sensitive     TRIMETH/SULFA <=20 SENSITIVE Sensitive     AMPICILLIN/SULBACTAM <=2 SENSITIVE Sensitive     PIP/TAZO <=4 SENSITIVE Sensitive ug/mL    * >=100,000 COLONIES/mL ESCHERICHIA COLI  Culture, blood (Routine X 2) w Reflex to ID Panel     Status: None (Preliminary result)   Collection Time: 08/18/23 12:39 PM   Specimen: BLOOD RIGHT ARM  Result Value Ref Range Status   Specimen Description   Final    BLOOD RIGHT ARM Performed at [facility], [address], [place], [place] [address]    Special Requests   Final    BOTTLES DRAWN AEROBIC AND ANAEROBIC Blood Culture adequate volume Performed at [facility], [address] [place], [place] [address]    Culture   Final    NO GROWTH < 24 HOURS Performed at [facility], [address], [place], [place] [address]    Report Status PENDING  Incomplete  Culture, blood (Routine X 2) w Reflex to ID Panel     Status: None (Preliminary result)   Collection Time: 08/18/23 12:41 PM   Specimen: BLOOD  Result Value Ref Range Status   Specimen Description   Final    BLOOD  BLOOD RIGHT ARM Performed at [facility], [address], [place], [place] [address]    Special Requests   Final    BOTTLES DRAWN AEROBIC AND ANAEROBIC Blood Culture adequate volume Performed at [facility], [address] [place], [place] [address]    Culture   Final    NO GROWTH < 24 HOURS Performed at [facility], [address] [place], [place] [address]    Report Status PENDING  Incomplete         Radiology Studies: ECHOCARDIOGRAM COMPLETE Result Date: 08/18/2023    ECHOCARDIOGRAM REPORT   Patient Name:   Madhuri Nevel Date of Exam: 08/18/2023 Medical Rec #:  [id number]    Height:       60.0 in Accession #:    [id number]   Weight:       138.0 lb Date of Birth:  05/03/1940    BSA:          1.594 m Patient Age:    83 years     BP:           100/52 mmHg Patient Gender: F            HR:           75 bpm. Exam Location:  Inpatient Procedure: 2D Echo, Cardiac Doppler and Color Doppler (Both Spectral and Color            Flow Doppler were utilized during procedure). Indications:    Elevated Troponin  History:        Patient has prior history of Echocardiogram examinations.                 Arrythmias:Atrial Fibrillation; Risk Factors:Hypertension and                 Dyslipidemia.  Sonographer:    Meagan Baucom RDCS, FE, PE Referring Phys: [id number] ARSHAD N KAKRAKANDY IMPRESSIONS  1. LV outflow tract  peak gradient at rest 138 mmHg. Left ventricular ejection fraction, by estimation, is 50 to 55%. The left ventricle has low normal function. The left ventricle has no regional wall motion abnormalities but there is septal-lateral dyssynchrony consistent with LBBB. There is moderate asymmetric left ventricular hypertrophy of the septal segment. Left ventricular diastolic parameters are consistent with Grade I diastolic dysfunction (impaired relaxation).  2. Right ventricular systolic function is normal. The right ventricular size is normal. There is moderately  elevated pulmonary artery systolic pressure. The estimated right ventricular systolic pressure is 55.6 mmHg.  3. Left atrial size was moderately dilated.  4. Mitral valve systolic anterior motion is present. The mitral valve is abnormal. Moderate eccentric mitral valve regurgitation. No evidence of mitral stenosis.  5. The aortic valve is tricuspid. There is mild calcification of the aortic valve. Aortic valve regurgitation is not visualized. No aortic stenosis is present.  6. The inferior vena cava is normal in size with <50% respiratory variability, suggesting right atrial pressure of 8 mmHg.  7. 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 FINDINGS  Left Ventricle: LV outflow tract peak gradient at rest 138 mmHg. Left ventricular ejection fraction, by estimation, is 50 to 55%. The left ventricle has low normal function. The left ventricle has no regional wall motion abnormalities. The left ventricular internal cavity size was normal in size. There is moderate asymmetric left ventricular hypertrophy of the septal segment. Left ventricular diastolic parameters are consistent with Grade I diastolic dysfunction (impaired relaxation). Right Ventricle: The right ventricular size is normal. No increase in right ventricular wall thickness. Right ventricular systolic function is normal. There is moderately elevated pulmonary artery systolic pressure. The tricuspid regurgitant velocity is 3.45 m/s, and with an assumed right atrial pressure of 8 mmHg, the estimated right ventricular systolic pressure is 55.6 mmHg. Left Atrium: Left atrial size was moderately dilated. Right Atrium: Right atrial size was normal in size. Pericardium: There is no evidence of pericardial effusion. Mitral Valve: Mitral valve systolic anterior motion is present. The mitral valve is abnormal. Moderate mitral valve regurgitation. No evidence of mitral valve stenosis. Tricuspid Valve: The tricuspid valve is normal in structure. Tricuspid valve regurgitation  is mild. Aortic Valve: The aortic valve is tricuspid. There is mild calcification of the aortic valve. Aortic valve regurgitation is not visualized. No aortic stenosis is present. Pulmonic Valve: The pulmonic valve was normal in structure. Pulmonic valve regurgitation is trivial. Aorta: The aortic root is normal in size and structure. Venous: The inferior vena cava is normal in size with less than 50% respiratory variability, suggesting right atrial pressure of 8 mmHg. IAS/Shunts: No atrial level shunt detected by color flow Doppler.  LEFT VENTRICLE PLAX 2D LVIDd:         3.50 cm   Diastology LVIDs:         2.70 cm   LV e' medial:    4.68 cm/s LV PW:         1.10 cm   LV E/e' medial:  23.5 LV IVS:        1.50 cm   LV e' lateral:   6.74 cm/s LVOT diam:     1.90 cm   LV E/e' lateral: 16.3 LVOT Area:     2.84 cm  RIGHT VENTRICLE RV Basal diam:  2.60 cm RV Mid diam:    2.00 cm RV S prime:     17.00 cm/s TAPSE (M-mode): 1.3 cm LEFT ATRIUM  Index        RIGHT ATRIUM           Index LA Vol (A2C):   59.5 ml 37.32 ml/m  RA Area:     12.70 cm LA Vol (A4C):   69.2 ml 43.41 ml/m  RA Volume:   28.00 ml  17.56 ml/m LA Biplane Vol: 65.1 ml 40.83 ml/m   AORTA Ao Root diam: 2.60 cm Ao Asc diam:  2.70 cm MITRAL VALVE                TRICUSPID VALVE MV Area (PHT): 3.48 cm     TR Peak grad:   47.6 mmHg MV Decel Time: 218 msec     TR Vmax:        345.00 cm/s MV E velocity: 110.00 cm/s MV Niang [place] velocity: 118.00 cm/s  SHUNTS MV E/Nilah Belcourt ratio:  0.93         Systemic Diam: 1.90 cm Dalton McleanMD Electronically signed by Archer Bear Signature Date/Time: 08/18/2023/2:32:04 PM    Final    CT ABDOMEN PELVIS W CONTRAST Result Date: 08/17/2023 EXAM: CT ABDOMEN AND PELVIS WITH CONTRAST 08/17/2023 08:49:42 PM TECHNIQUE: CT of the abdomen and pelvis was performed with the administration of intravenous contrast. Multiplanar reformatted images are provided for review. Automated exposure control, iterative reconstruction, and/or weight  based adjustment of the mA/kV was utilized to reduce the radiation dose to as low as reasonably achievable. COMPARISON: 03/02/2023 CLINICAL HISTORY: Abdominal pain, acute, nonlocalized. Table formatting from the original note was not included. Images from the original note were not included. Pt. BIBEMS from home w/ c/o Zianna Dercole fall. No LOC. Pt is on eliquis  BID. Pt. C/o pain and tenderness to her left elbow. Pt. States she had first txt for bladder cancer last Tuesday and has been feeling weak and has had chills since. Per EMS stroke screen; negative. FINDINGS: LOWER CHEST: Mild subpleural reticulation/fibrosis at the lung bases. LIVER: The liver is unremarkable. GALLBLADDER AND BILE DUCTS: Gallbladder is unremarkable. No biliary ductal dilatation. SPLEEN: No acute abnormality. PANCREAS: No acute abnormality. ADRENAL GLANDS: No acute abnormality. KIDNEYS, URETERS AND BLADDER: Simple left upper pole renal cyst measuring up to 18 mm, benign. No follow-up is recommended. Mild diminished/delayed enhancement of the right kidney with associated mild right hydroureteronephrosis, although without Sylvan Sookdeo visualized ureteral or bladder calculus. Differential consideration to include recent passage of Birch Farino ureteral calculus or ascending infection. Trace nondependent gas in the bladder with subtle perivesical stranding, correlate for cystitis. GI AND BOWEL: The appendix is not discretely visualized. PERITONEUM AND RETROPERITONEUM: No ascites. No free air. VASCULATURE: Atherosclerotic calcifications of the abdominal aorta and branch vessels. LYMPH NODES: No lymphadenopathy. REPRODUCTIVE ORGANS: No acute abnormality. BONES AND SOFT TISSUES: No acute osseous abnormality. No focal soft tissue abnormality. IMPRESSION: 1. Trace nondependent gas in the bladder and subtle perivesical stranding, correlate for cystitis. 2. Mild diminished/delayed enhancement of the right kidney with associated mild right hydroureteronephrosis. Differential  considerations include recent passage of Kila Godina ureteral calculus or ascending infection. Electronically signed by: Zadie Herter MD 08/17/2023 08:57 PM EDT RP Workstation: [id number]   DG Chest Port 1 View Result Date: 08/17/2023 CLINICAL DATA:  Pain. Fall. History of recent treatment for bladder cancer. Weakness and chills. EXAM: PORTABLE CHEST 1 VIEW COMPARISON:  07/24/2022 FINDINGS: Cardiac enlargement with mild pulmonary vascular congestion. Perihilar and basilar infiltrates are new since prior study, likely edema. Pneumonia would also be Aime Meloche possibility. No pleural effusion or pneumothorax. Mediastinal contours appear intact. Calcification of the  aorta. IMPRESSION: Cardiac enlargement with pulmonary vascular congestion and bilateral pulmonary infiltrates. Findings are new since prior study. Electronically Signed   By: Boyce Byes M.D.   On: 08/17/2023 19:41   DG Elbow Complete Left Result Date: 08/17/2023 CLINICAL DATA:  Pain after Diem Dicocco fall. Left posterior elbow pain after Kanyon Seibold fall today. EXAM: LEFT ELBOW - COMPLETE 3+ VIEW COMPARISON:  Left forearm 08/27/2021 FINDINGS: Degenerative changes in the left elbow. No evidence of acute fracture or dislocation. No focal bone lesion or bone destruction. Bone cortex appears intact. No significant effusions. Soft tissue defect over the olecranon region consistent with laceration. No radiopaque soft tissue foreign bodies. Soft tissue calcification projected over the forearm, likely dystrophic. IMPRESSION: No acute bony abnormalities. Soft tissue defect over the olecranon region consistent with laceration. Electronically Signed   By: Boyce Byes M.D.   On: 08/17/2023 17:29        Scheduled Meds:  apixaban   5 mg Oral BID   buPROPion   150 mg Oral Daily   phenylephrine       verapamil   120 mg Oral Daily   Continuous Infusions:  amiodarone  30 mg/hr (08/19/23 1323)   lactated ringers Stopped (08/19/23 0853)   levofloxacin (LEVAQUIN) IV       LOS: 1 day     Time spent: over 30 min    Donnetta Gains, MD [facility]   To contact the attending provider between 7A-7P or the covering provider during after hours 7P-7A, please log into the web site [email or web address] and access using universal Oasis password for that web site. If you do not have the password, please call the hospital operator.  08/19/2023, 3:06 PM

## 2023-08-19 NOTE — Progress Notes (Signed)
 Dr Jarrell Merritts / Anesthesia in to see pt. Okayed for pt to begin clear liquids. Discontinued pts O2 per [place]. O2 sat 94 on RA.

## 2023-08-19 NOTE — Progress Notes (Signed)
 OT Cancellation Note  Patient Details Name: Taleah Bellantoni MRN: [id number] DOB: [birth year]   Cancelled Treatment:    Reason Eval/Treat Not Completed: Patient at procedure or test/ unavailable Patient is off the floor for stent placement at this time. OT to continue to follow and check back as schedule will allow.  Wynette Heckler, MS Acute Rehabilitation Department Office# [phone]  08/19/2023, 6:43 AM

## 2023-08-19 NOTE — Progress Notes (Signed)
 Patient went into afib pre-operatively.  Concern for additional cardiac issues based on Echo from yesterday.  While I know she needs a stent placed, we will wait until she is medically optimized to do it.  Plan to postpone stent placement until it can be safely placed under anesthesia.

## 2023-08-19 NOTE — Progress Notes (Signed)
 Pt just converted back to NSR

## 2023-08-19 NOTE — Progress Notes (Signed)
 Patient seen in preop this morning in preparation for planned cystoscopy/stent placement. On my initial encounter around 06:45 patient was in NSR in 53s with normal BP. She c/o "pain all over" but had no specific complaints. Patient was intermittently on monitor but around 07:20 she was noted to be in Afib with rate 170s. She c/o chest pressure and tightness that radiated to her back. I spoke with cardiology (Dr. Emmette Harms, who saw her yesterday as consult) and gave her amiodarone  150mg  IV followed by infusion started at 60mg /hr. Her rate improved slightly to 140s-150s but she remained symptomatic with chest pressure and diaphoresis. Esmolol given with improvement in rate to 120s with BP remaining stable with SBP 120s. Plan created in conjunction with cardiology and hospitalist to transfer to step down/ICU however no beds available at [facility]. Cardiology PA and hospitalist in preop at bedside and plan to transfer to [facility] for TEE/cardioversion (she has missed a couple doses of her Eliquis ). Procedure to be postponed until patient stable enough for anesthesia which is high risk at patient's baseline due to her severe HOCM.  Colie Dawes, MD, MPH Anesthesiology

## 2023-08-19 NOTE — Consult Note (Signed)
 Patient has been rescheduled for her stent placement at 3:30 tomorrow afternoon.  Please ensure she is NPO.

## 2023-08-19 NOTE — Progress Notes (Signed)
 Progress Note  Patient Name: Laurie Bowman Date of Encounter: 08/19/2023  Primary Cardiologist: Zoe Hinds, MD   Subjective   Patient see and examined at her bedside in short stay.  Her nurse about the bedside.  Events noted this morning: She was sinus rhythm this morning when she was taken to the PACU to be prepped for her procedure.  Unfortunately she went to atrial fibrillation with rapid ventricular rate she was started on amiodarone  drip then to Cardizem  drip.  The procedure was not started.  Inpatient Medications    Scheduled Meds:  apixaban   5 mg Oral BID   [MAR Hold] buPROPion   150 mg Oral Daily   chlorhexidine  15 mL Mouth/Throat Once   phenylephrine       potassium chloride   40 mEq Oral Once   [MAR Hold] verapamil   120 mg Oral Daily   Continuous Infusions:  amiodarone  60 mg/hr (08/19/23 0754)   Followed by   amiodarone      ceFAZolin      ceFAZolin (ANCEF) IV     diltiazem  (CARDIZEM ) infusion     lactated ringers Stopped (08/19/23 0853)   [MAR Hold] levofloxacin (LEVAQUIN) IV     PRN Meds: ceFAZolin, fentaNYL (SUBLIMAZE) injection, phenylephrine   Vital Signs    Vitals:   08/19/23 1045 08/19/23 1056 08/19/23 1107 08/19/23 1130  BP: 92/63 113/61 (!) 106/59 109/64  Pulse: 76 77 74 71  Resp: (!) 24 15 18 16   Temp:      TempSrc:      SpO2: 98% 99% 96% 99%  Weight:      Height:        Intake/Output Summary (Last 24 hours) at 08/19/2023 1139 Last data filed at 08/19/2023 0529 Gross per 24 hour  Intake 2242.21 ml  Output 1000 ml  Net 1242.21 ml   Filed Weights   08/17/23 1532  Weight: 62.6 kg    Telemetry    Sinus rhythm, heart rate in the 70s- Personally Reviewed  ECG   - Personally Reviewed  Physical Exam   General: Comfortable, lying in the bed Head: Atraumatic, normal size  Eyes: PEERLA, EOMI  Neck: Supple, normal JVD Cardiac: Normal S1, S2; RRR; no murmurs, rubs, or gallops Lungs: Clear to auscultation bilaterally Abd: Soft,  nontender, no hepatomegaly  Ext: warm, no edema Skin: Warm and dry, no rashes   Psych: Normal mood and affect   Labs    Chemistry Recent Labs  Lab 08/17/23 1612 08/18/23 0034 08/19/23 0358  NA 129* 127* 130*  K 3.6 3.4* 3.3*  CL 95* 97* 100  CO2 21* 21* 22  GLUCOSE 94 97 97  BUN 24* 24* 23  CREATININE 1.46* 1.43* 1.18*  CALCIUM  9.5 8.6* 8.7*  PROT 7.0 5.9* 5.5*  ALBUMIN 3.6 2.9* 2.7*  AST 36 60* 78*  ALT 23 26 38  ALKPHOS 55 47 44  BILITOT 2.5* 1.8* 1.6*  GFRNONAA 36* 37* 46*  ANIONGAP 13 9 8      Hematology Recent Labs  Lab 08/17/23 1612 08/18/23 0034 08/19/23 0358  WBC 11.2* 9.6 5.4  RBC 4.14 3.61* 3.43*  HGB 13.1 11.6* 11.0*  HCT 39.5 35.2* 33.5*  MCV 95.4 97.5 97.7  MCH 31.6 32.1 32.1  MCHC 33.2 33.0 32.8  RDW 13.2 13.2 13.2  PLT 178 158 148*    Cardiac EnzymesNo results for input(s): "TROPONINI" in the last 168 hours. No results for input(s): "TROPIPOC" in the last 168 hours.   BNP Recent Labs  Lab  08/17/23 2025  BNP 459.6*     DDimer No results for input(s): "DDIMER" in the last 168 hours.   Radiology    ECHOCARDIOGRAM COMPLETE Result Date: 08/18/2023    ECHOCARDIOGRAM REPORT   Patient Name:   Laurie Bowman Date of Exam: 08/18/2023 Medical Rec #:  [id number]    Height:       60.0 in Accession #:    [id number]   Weight:       138.0 lb Date of Birth:  1941/01/25    BSA:          1.594 m Patient Age:    83 years     BP:           100/52 mmHg Patient Gender: F            HR:           75 bpm. Exam Location:  Inpatient Procedure: 2D Echo, Cardiac Doppler and Color Doppler (Both Spectral and Color            Flow Doppler were utilized during procedure). Indications:    Elevated Troponin  History:        Patient has prior history of Echocardiogram examinations.                 Arrythmias:Atrial Fibrillation; Risk Factors:Hypertension and                 Dyslipidemia.  Sonographer:    Meagan Baucom RDCS, FE, PE Referring Phys: [id number] ARSHAD N KAKRAKANDY IMPRESSIONS   1. LV outflow tract peak gradient at rest 138 mmHg. Left ventricular ejection fraction, by estimation, is 50 to 55%. The left ventricle has low normal function. The left ventricle has no regional wall motion abnormalities but there is septal-lateral dyssynchrony consistent with LBBB. There is moderate asymmetric left ventricular hypertrophy of the septal segment. Left ventricular diastolic parameters are consistent with Grade I diastolic dysfunction (impaired relaxation).  2. Right ventricular systolic function is normal. The right ventricular size is normal. There is moderately elevated pulmonary artery systolic pressure. The estimated right ventricular systolic pressure is 55.6 mmHg.  3. Left atrial size was moderately dilated.  4. Mitral valve systolic anterior motion is present. The mitral valve is abnormal. Moderate eccentric mitral valve regurgitation. No evidence of mitral stenosis.  5. The aortic valve is tricuspid. There is mild calcification of the aortic valve. Aortic valve regurgitation is not visualized. No aortic stenosis is present.  6. The inferior vena cava is normal in size with <50% respiratory variability, suggesting right atrial pressure of 8 mmHg.  7. 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 FINDINGS  Left Ventricle: LV outflow tract peak gradient at rest 138 mmHg. Left ventricular ejection fraction, by estimation, is 50 to 55%. The left ventricle has low normal function. The left ventricle has no regional wall motion abnormalities. The left ventricular internal cavity size was normal in size. There is moderate asymmetric left ventricular hypertrophy of the septal segment. Left ventricular diastolic parameters are consistent with Grade I diastolic dysfunction (impaired relaxation). Right Ventricle: The right ventricular size is normal. No increase in right ventricular wall thickness. Right ventricular systolic function is normal. There is moderately elevated pulmonary artery systolic pressure. The  tricuspid regurgitant velocity is 3.45 m/s, and with an assumed right atrial pressure of 8 mmHg, the estimated right ventricular systolic pressure is 55.6 mmHg. Left Atrium: Left atrial size was moderately dilated. Right Atrium: Right atrial size was normal in size. Pericardium: There is  no evidence of pericardial effusion. Mitral Valve: Mitral valve systolic anterior motion is present. The mitral valve is abnormal. Moderate mitral valve regurgitation. No evidence of mitral valve stenosis. Tricuspid Valve: The tricuspid valve is normal in structure. Tricuspid valve regurgitation is mild. Aortic Valve: The aortic valve is tricuspid. There is mild calcification of the aortic valve. Aortic valve regurgitation is not visualized. No aortic stenosis is present. Pulmonic Valve: The pulmonic valve was normal in structure. Pulmonic valve regurgitation is trivial. Aorta: The aortic root is normal in size and structure. Venous: The inferior vena cava is normal in size with less than 50% respiratory variability, suggesting right atrial pressure of 8 mmHg. IAS/Shunts: No atrial level shunt detected by color flow Doppler.  LEFT VENTRICLE PLAX 2D LVIDd:         3.50 cm   Diastology LVIDs:         2.70 cm   LV e' medial:    4.68 cm/s LV PW:         1.10 cm   LV E/e' medial:  23.5 LV IVS:        1.50 cm   LV e' lateral:   6.74 cm/s LVOT diam:     1.90 cm   LV E/e' lateral: 16.3 LVOT Area:     2.84 cm  RIGHT VENTRICLE RV Basal diam:  2.60 cm RV Mid diam:    2.00 cm RV S prime:     17.00 cm/s TAPSE (M-mode): 1.3 cm LEFT ATRIUM             Index        RIGHT ATRIUM           Index LA Vol (A2C):   59.5 ml 37.32 ml/m  RA Area:     12.70 cm LA Vol (A4C):   69.2 ml 43.41 ml/m  RA Volume:   28.00 ml  17.56 ml/m LA Biplane Vol: 65.1 ml 40.83 ml/m   AORTA Ao Root diam: 2.60 cm Ao Asc diam:  2.70 cm MITRAL VALVE                TRICUSPID VALVE MV Area (PHT): 3.48 cm     TR Peak grad:   47.6 mmHg MV Decel Time: 218 msec     TR Vmax:         345.00 cm/s MV E velocity: 110.00 cm/s MV A velocity: 118.00 cm/s  SHUNTS MV E/A ratio:  0.93         Systemic Diam: 1.90 cm Dalton McleanMD Electronically signed by Archer Bear Signature Date/Time: 08/18/2023/2:32:04 PM    Final    CT ABDOMEN PELVIS W CONTRAST Result Date: 08/17/2023 EXAM: CT ABDOMEN AND PELVIS WITH CONTRAST 08/17/2023 08:49:42 PM TECHNIQUE: CT of the abdomen and pelvis was performed with the administration of intravenous contrast. Multiplanar reformatted images are provided for review. Automated exposure control, iterative reconstruction, and/or weight based adjustment of the mA/kV was utilized to reduce the radiation dose to as low as reasonably achievable. COMPARISON: 03/02/2023 CLINICAL HISTORY: Abdominal pain, acute, nonlocalized. Table formatting from the original note was not included. Images from the original note were not included. Pt. BIBEMS from home w/ c/o a fall. No LOC. Pt is on eliquis  BID. Pt. C/o pain and tenderness to her left elbow. Pt. States she had first txt for bladder cancer last Tuesday and has been feeling weak and has had chills since. Per EMS stroke screen; negative. FINDINGS: LOWER CHEST: Mild subpleural reticulation/fibrosis at the  lung bases. LIVER: The liver is unremarkable. GALLBLADDER AND BILE DUCTS: Gallbladder is unremarkable. No biliary ductal dilatation. SPLEEN: No acute abnormality. PANCREAS: No acute abnormality. ADRENAL GLANDS: No acute abnormality. KIDNEYS, URETERS AND BLADDER: Simple left upper pole renal cyst measuring up to 18 mm, benign. No follow-up is recommended. Mild diminished/delayed enhancement of the right kidney with associated mild right hydroureteronephrosis, although without a visualized ureteral or bladder calculus. Differential consideration to include recent passage of a ureteral calculus or ascending infection. Trace nondependent gas in the bladder with subtle perivesical stranding, correlate for cystitis. GI AND BOWEL: The appendix  is not discretely visualized. PERITONEUM AND RETROPERITONEUM: No ascites. No free air. VASCULATURE: Atherosclerotic calcifications of the abdominal aorta and branch vessels. LYMPH NODES: No lymphadenopathy. REPRODUCTIVE ORGANS: No acute abnormality. BONES AND SOFT TISSUES: No acute osseous abnormality. No focal soft tissue abnormality. IMPRESSION: 1. Trace nondependent gas in the bladder and subtle perivesical stranding, correlate for cystitis. 2. Mild diminished/delayed enhancement of the right kidney with associated mild right hydroureteronephrosis. Differential considerations include recent passage of a ureteral calculus or ascending infection. Electronically signed by: Zadie Herter MD 08/17/2023 08:57 PM EDT RP Workstation: [id number]   DG Chest Port 1 View Result Date: 08/17/2023 CLINICAL DATA:  Pain. Fall. History of recent treatment for bladder cancer. Weakness and chills. EXAM: PORTABLE CHEST 1 VIEW COMPARISON:  07/24/2022 FINDINGS: Cardiac enlargement with mild pulmonary vascular congestion. Perihilar and basilar infiltrates are new since prior study, likely edema. Pneumonia would also be a possibility. No pleural effusion or pneumothorax. Mediastinal contours appear intact. Calcification of the aorta. IMPRESSION: Cardiac enlargement with pulmonary vascular congestion and bilateral pulmonary infiltrates. Findings are new since prior study. Electronically Signed   By: Boyce Byes M.D.   On: 08/17/2023 19:41   DG Elbow Complete Left Result Date: 08/17/2023 CLINICAL DATA:  Pain after a fall. Left posterior elbow pain after a fall today. EXAM: LEFT ELBOW - COMPLETE 3+ VIEW COMPARISON:  Left forearm 08/27/2021 FINDINGS: Degenerative changes in the left elbow. No evidence of acute fracture or dislocation. No focal bone lesion or bone destruction. Bone cortex appears intact. No significant effusions. Soft tissue defect over the olecranon region consistent with laceration. No radiopaque soft tissue  foreign bodies. Soft tissue calcification projected over the forearm, likely dystrophic. IMPRESSION: No acute bony abnormalities. Soft tissue defect over the olecranon region consistent with laceration. Electronically Signed   By: Boyce Byes M.D.   On: 08/17/2023 17:29    Cardiac Studies   echo  Patient Profile     83 y.o. female with atrial fibrillation rapid trickle of heart rate, hypertrophic cardiomyopathy  Assessment & Plan    Atrial Fibrillation with rapid ventricular rate-she was noted to be in A-fib on her initial presentation and converted after Cardizem .  Yesterday during our evaluation she was in sinus rhythm.  Unfortunately this morning at the planing of her procedure for a cystoscopy/stent placement she went into atrial fibrillation rapid ventricular rate which required the start of amiodarone  and then adding Cardizem .  During the time of A-fib RVR she is significantly symptomatic with chest pressure and diaphoresis.  Thankfully she has converted to sinus rhythm.  Will keep her on the Cardizem  drip at this time with amiodarone  infusion.  She will remain on her anticoagulation with Eliquis , for now urologist is okay with the patient continuing her Eliquis  through the procedure.  Will continue to follow.   Hypertrophic cardiomyopathy-recent echo shows significant resting gradient which I do believe this is in  volume depletion given her recent nausea vomiting.  She would benefit from some IV fluids.  Will continue to monitor closely.  And it would really be in her best interest to maintain sinus rhythm as well.    NSTEMI-type II mismatch in the setting of atrial fibrillation at this time will not be pursuing any invasive testing.   Hypertension-blood pressure is at target.  AKI-creatinine continue to improve.  Hyperlipidemia-continue to hold Crestor  for now  Rhabdomyolysis-has gotten some fluid per primary team.  Bladder cancer-getting outpatient treatment     For  questions or updates, please contact [facility] Please consult [email or web address] for contact info under Cardiology/STEMI.      Signed, Amarachukwu Lakatos, DO  08/19/2023, 11:39 AM

## 2023-08-20 ENCOUNTER — Inpatient Hospital Stay (HOSPITAL_COMMUNITY)

## 2023-08-20 ENCOUNTER — Inpatient Hospital Stay (HOSPITAL_COMMUNITY): Admitting: Anesthesiology

## 2023-08-20 ENCOUNTER — Encounter (HOSPITAL_COMMUNITY): Payer: Self-pay | Admitting: Internal Medicine

## 2023-08-20 ENCOUNTER — Encounter (HOSPITAL_COMMUNITY)
Admission: EM | Disposition: A | Discharge status: Skilled Nursing Facility | Payer: Self-pay | Source: Home / Self Care | Attending: Internal Medicine

## 2023-08-20 DIAGNOSIS — I480 Paroxysmal atrial fibrillation: Secondary | ICD-10-CM | POA: Diagnosis not present

## 2023-08-20 DIAGNOSIS — N179 Acute kidney failure, unspecified: Secondary | ICD-10-CM | POA: Diagnosis not present

## 2023-08-20 DIAGNOSIS — J45909 Unspecified asthma, uncomplicated: Secondary | ICD-10-CM

## 2023-08-20 DIAGNOSIS — I4891 Unspecified atrial fibrillation: Secondary | ICD-10-CM

## 2023-08-20 DIAGNOSIS — N201 Calculus of ureter: Secondary | ICD-10-CM | POA: Diagnosis not present

## 2023-08-20 DIAGNOSIS — N1330 Unspecified hydronephrosis: Secondary | ICD-10-CM

## 2023-08-20 LAB — CBC WITH DIFFERENTIAL/PLATELET
Abs Immature Granulocytes: 0.05 10*3/uL (ref 0.00–0.07)
Basophils Absolute: 0 10*3/uL (ref 0.0–0.1)
Basophils Relative: 0 %
Eosinophils Absolute: 0 10*3/uL (ref 0.0–0.5)
Eosinophils Relative: 0 %
HCT: 32.5 % — ABNORMAL LOW (ref 36.0–46.0)
Hemoglobin: 10.7 g/dL — ABNORMAL LOW (ref 12.0–15.0)
Immature Granulocytes: 1 %
Lymphocytes Relative: 5 %
Lymphs Abs: 0.4 10*3/uL — ABNORMAL LOW (ref 0.7–4.0)
MCH: 31.8 pg (ref 26.0–34.0)
MCHC: 32.9 g/dL (ref 30.0–36.0)
MCV: 96.7 fL (ref 80.0–100.0)
Monocytes Absolute: 1 10*3/uL (ref 0.1–1.0)
Monocytes Relative: 11 %
Neutro Abs: 7.1 10*3/uL (ref 1.7–7.7)
Neutrophils Relative %: 83 %
Platelets: 153 10*3/uL (ref 150–400)
RBC: 3.36 MIL/uL — ABNORMAL LOW (ref 3.87–5.11)
RDW: 13.3 % (ref 11.5–15.5)
WBC: 8.6 10*3/uL (ref 4.0–10.5)
nRBC: 0 % (ref 0.0–0.2)

## 2023-08-20 LAB — COMPREHENSIVE METABOLIC PANEL WITH GFR
ALT: 46 U/L — ABNORMAL HIGH (ref 0–44)
AST: 71 U/L — ABNORMAL HIGH (ref 15–41)
Albumin: 2.5 g/dL — ABNORMAL LOW (ref 3.5–5.0)
Alkaline Phosphatase: 52 U/L (ref 38–126)
Anion gap: 9 (ref 5–15)
BUN: 17 mg/dL (ref 8–23)
CO2: 21 mmol/L — ABNORMAL LOW (ref 22–32)
Calcium: 8.2 mg/dL — ABNORMAL LOW (ref 8.9–10.3)
Chloride: 100 mmol/L (ref 98–111)
Creatinine, Ser: 1.18 mg/dL — ABNORMAL HIGH (ref 0.44–1.00)
GFR, Estimated: 46 mL/min — ABNORMAL LOW (ref >60)
Glucose, Bld: 110 mg/dL — ABNORMAL HIGH (ref 70–99)
Potassium: 3.2 mmol/L — ABNORMAL LOW (ref 3.5–5.1)
Sodium: 130 mmol/L — ABNORMAL LOW (ref 135–145)
Total Bilirubin: 1.5 mg/dL — ABNORMAL HIGH (ref 0.0–1.2)
Total Protein: 5.4 g/dL — ABNORMAL LOW (ref 6.5–8.1)

## 2023-08-20 LAB — SURGICAL PCR SCREEN
MRSA, PCR: NEGATIVE
Staphylococcus aureus: POSITIVE — AB

## 2023-08-20 LAB — CK: Total CK: 333 U/L — ABNORMAL HIGH (ref 38–234)

## 2023-08-20 LAB — GLUCOSE, CAPILLARY: Glucose-Capillary: 98 mg/dL (ref 70–99)

## 2023-08-20 LAB — APTT: aPTT: 41 s — ABNORMAL HIGH (ref 24–36)

## 2023-08-20 LAB — HEPARIN LEVEL (UNFRACTIONATED): Heparin Unfractionated: >1.1 [IU]/mL — ABNORMAL HIGH (ref 0.30–0.70)

## 2023-08-20 LAB — PHOSPHORUS: Phosphorus: 2 mg/dL — ABNORMAL LOW (ref 2.5–4.6)

## 2023-08-20 LAB — MAGNESIUM: Magnesium: 1.6 mg/dL — ABNORMAL LOW (ref 1.7–2.4)

## 2023-08-20 SURGERY — CYSTOSCOPY, WITH RETROGRADE PYELOGRAM AND URETERAL STENT INSERTION
Anesthesia: Monitor Anesthesia Care | Laterality: Right

## 2023-08-20 MED ORDER — CEFAZOLIN SODIUM-DEXTROSE 2-4 GM/100ML-% IV SOLN
2.0000 g | Freq: Once | INTRAVENOUS | Status: AC
  Administered 2023-08-20: 2 g via INTRAVENOUS

## 2023-08-20 MED ORDER — POTASSIUM CHLORIDE CRYS ER 20 MEQ PO TBCR
40.0000 meq | EXTENDED_RELEASE_TABLET | Freq: Once | ORAL | Status: AC
  Administered 2023-08-20: 40 meq via ORAL
  Filled 2023-08-20: qty 2

## 2023-08-20 MED ORDER — ONDANSETRON HCL 4 MG/2ML IJ SOLN
INTRAMUSCULAR | Status: DC | PRN
  Administered 2023-08-20: 4 mg via INTRAVENOUS

## 2023-08-20 MED ORDER — LACTATED RINGERS IV SOLN
INTRAVENOUS | Status: DC | PRN
Start: 2023-08-20 — End: 2023-08-20

## 2023-08-20 MED ORDER — AMIODARONE HCL 200 MG PO TABS
200.0000 mg | ORAL_TABLET | Freq: Two times a day (BID) | ORAL | Status: DC
  Administered 2023-08-22 – 2023-08-28 (×13): 200 mg via ORAL
  Filled 2023-08-20 (×13): qty 1

## 2023-08-20 MED ORDER — LIDOCAINE HCL (CARDIAC) PF 100 MG/5ML IV SOSY
PREFILLED_SYRINGE | INTRAVENOUS | Status: DC | PRN
  Administered 2023-08-20: 100 mg via INTRAVENOUS

## 2023-08-20 MED ORDER — FENTANYL CITRATE (PF) 100 MCG/2ML IJ SOLN
INTRAMUSCULAR | Status: AC
  Filled 2023-08-20: qty 2

## 2023-08-20 MED ORDER — MUPIROCIN 2 % EX OINT
1.0000 | TOPICAL_OINTMENT | Freq: Two times a day (BID) | CUTANEOUS | Status: AC
  Administered 2023-08-20 – 2023-08-24 (×10): 1 via NASAL
  Filled 2023-08-20: qty 22

## 2023-08-20 MED ORDER — SODIUM CHLORIDE 0.9 % IR SOLN
Status: DC | PRN
Start: 2023-08-20 — End: 2023-08-20
  Administered 2023-08-20: 1000 mL

## 2023-08-20 MED ORDER — SODIUM CHLORIDE (PF) 0.9 % IJ SOLN
INTRAMUSCULAR | Status: AC
  Filled 2023-08-20: qty 20

## 2023-08-20 MED ORDER — K PHOS MONO-SOD PHOS DI & MONO 155-852-130 MG PO TABS
500.0000 mg | ORAL_TABLET | Freq: Two times a day (BID) | ORAL | Status: DC
  Administered 2023-08-20 – 2023-08-26 (×13): 500 mg via ORAL
  Filled 2023-08-20 (×14): qty 2

## 2023-08-20 MED ORDER — DILTIAZEM HCL-DEXTROSE 125-5 MG/125ML-% IV SOLN (PREMIX)
5.0000 mg/h | INTRAVENOUS | Status: DC
  Filled 2023-08-20: qty 125

## 2023-08-20 MED ORDER — PROPOFOL 500 MG/50ML IV EMUL
INTRAVENOUS | Status: DC | PRN
  Administered 2023-08-20: 75 ug/kg/min via INTRAVENOUS

## 2023-08-20 MED ORDER — PROPOFOL 10 MG/ML IV BOLUS
INTRAVENOUS | Status: AC
  Filled 2023-08-20: qty 20

## 2023-08-20 MED ORDER — AMIODARONE HCL 200 MG PO TABS: 200.0000 mg | ORAL_TABLET | Freq: Every day | ORAL | Status: DC

## 2023-08-20 MED ORDER — FLUORESCEIN SODIUM 10 % IV SOLN
INTRAVENOUS | Status: AC
Start: 2023-08-20 — End: ?
  Filled 2023-08-20: qty 5

## 2023-08-20 MED ORDER — SODIUM CHLORIDE (PF) 0.9 % IJ SOLN
INTRAMUSCULAR | Status: AC
  Filled 2023-08-20: qty 10

## 2023-08-20 MED ORDER — PROPOFOL 10 MG/ML IV BOLUS
INTRAVENOUS | Status: DC | PRN
Start: 2023-08-20 — End: 2023-08-20
  Administered 2023-08-20: 20 mg via INTRAVENOUS

## 2023-08-20 MED ORDER — OXYBUTYNIN CHLORIDE 5 MG PO TABS
2.5000 mg | ORAL_TABLET | Freq: Once | ORAL | Status: AC | PRN
  Administered 2023-08-20: 2.5 mg via ORAL
  Filled 2023-08-20: qty 1

## 2023-08-20 MED ORDER — MAGNESIUM SULFATE 2 GM/50ML IV SOLN: 2.0000 g | Freq: Once | INTRAVENOUS | Status: DC

## 2023-08-20 MED ORDER — IOHEXOL 300 MG/ML  SOLN
INTRAMUSCULAR | Status: DC | PRN
  Administered 2023-08-20: 2 mL

## 2023-08-20 MED ORDER — PHENYLEPHRINE HCL (PRESSORS) 10 MG/ML IV SOLN
INTRAVENOUS | Status: AC
  Filled 2023-08-20: qty 1

## 2023-08-20 MED ORDER — ACETAMINOPHEN 10 MG/ML IV SOLN: 1000.0000 mg | Freq: Once | INTRAVENOUS | Status: DC | PRN

## 2023-08-20 MED ORDER — PHENYLEPHRINE HCL-NACL 20-0.9 MG/250ML-% IV SOLN
INTRAVENOUS | Status: DC | PRN
  Administered 2023-08-20: 50 ug/min via INTRAVENOUS

## 2023-08-20 MED ORDER — FLUORESCEIN SODIUM 10 % IV SOLN
INTRAVENOUS | Status: DC | PRN
  Administered 2023-08-20: 50 mg via INTRAVENOUS

## 2023-08-20 MED ORDER — ONDANSETRON HCL 4 MG/2ML IJ SOLN
INTRAMUSCULAR | Status: AC
  Filled 2023-08-20: qty 2

## 2023-08-20 MED ORDER — FENTANYL CITRATE PF 50 MCG/ML IJ SOSY: 25.0000 ug | PREFILLED_SYRINGE | INTRAMUSCULAR | Status: DC | PRN

## 2023-08-20 MED ORDER — METHYLENE BLUE (ANTIDOTE) 1 % IV SOLN
INTRAVENOUS | Status: AC
Start: 2023-08-20 — End: ?
  Filled 2023-08-20: qty 10

## 2023-08-20 MED ORDER — HEPARIN (PORCINE) 25000 UT/250ML-% IV SOLN
900.0000 [IU]/h | INTRAVENOUS | Status: DC
  Filled 2023-08-20: qty 250

## 2023-08-20 MED ORDER — ONDANSETRON HCL 4 MG/2ML IJ SOLN: 4.0000 mg | Freq: Once | INTRAMUSCULAR | Status: DC | PRN

## 2023-08-20 MED ORDER — AMIODARONE HCL 200 MG PO TABS
400.0000 mg | ORAL_TABLET | Freq: Two times a day (BID) | ORAL | Status: AC
  Administered 2023-08-20 – 2023-08-21 (×4): 400 mg via ORAL
  Filled 2023-08-20 (×4): qty 2

## 2023-08-20 MED ORDER — CEFAZOLIN SODIUM-DEXTROSE 2-4 GM/100ML-% IV SOLN
INTRAVENOUS | Status: AC
  Filled 2023-08-20: qty 100

## 2023-08-20 MED ORDER — HYALURONIDASE HUMAN 150 UNIT/ML IJ SOLN
150.0000 [IU] | Freq: Once | INTRAMUSCULAR | Status: AC
  Administered 2023-08-20: 150 [IU] via SUBCUTANEOUS
  Filled 2023-08-20: qty 1

## 2023-08-20 MED ORDER — ETOMIDATE 2 MG/ML IV SOLN
INTRAVENOUS | Status: AC
  Filled 2023-08-20: qty 10

## 2023-08-20 MED ORDER — FENTANYL CITRATE (PF) 100 MCG/2ML IJ SOLN
INTRAMUSCULAR | Status: DC | PRN
  Administered 2023-08-20 (×3): 25 ug via INTRAVENOUS

## 2023-08-20 MED ORDER — HYDROXYZINE HCL 10 MG PO TABS
10.0000 mg | ORAL_TABLET | Freq: Once | ORAL | Status: AC | PRN
  Administered 2023-08-20: 10 mg via ORAL
  Filled 2023-08-20: qty 1

## 2023-08-20 MED ORDER — LIDOCAINE HCL (PF) 2 % IJ SOLN
INTRAMUSCULAR | Status: AC
  Filled 2023-08-20: qty 5

## 2023-08-20 SURGICAL SUPPLY — 15 items
BAG URO CATCHER STRL LF (MISCELLANEOUS) ×1 IMPLANT
BASKET ZERO TIP NITINOL 2.4FR (BASKET) IMPLANT
CATH URETL OPEN 5X70 (CATHETERS) ×1 IMPLANT
CLOTH BEACON ORANGE TIMEOUT ST (SAFETY) ×1 IMPLANT
GLOVE SURG LX STRL 7.5 STRW (GLOVE) ×1 IMPLANT
GOWN STRL REUS W/ TWL XL LVL3 (GOWN DISPOSABLE) ×1 IMPLANT
GUIDEWIRE ANG ZIPWIRE 038X150 (WIRE) IMPLANT
GUIDEWIRE STR DUAL SENSOR (WIRE) ×1 IMPLANT
KIT TURNOVER KIT A (KITS) IMPLANT
MANIFOLD NEPTUNE II (INSTRUMENTS) ×1 IMPLANT
PACK CYSTO (CUSTOM PROCEDURE TRAY) ×1 IMPLANT
SHEATH NAVIGATOR HD 12/14X36 (SHEATH) IMPLANT
TRACTIP FLEXIVA PULS ID 200XHI (Laser) IMPLANT
TUBING CONNECTING 10 (TUBING) ×1 IMPLANT
TUBING UROLOGY SET (TUBING) ×1 IMPLANT

## 2023-08-20 NOTE — Anesthesia Preprocedure Evaluation (Addendum)
 Anesthesia Evaluation  Patient identified by MRN, date of birth, ID band Patient awake    Reviewed: Allergy & Precautions, NPO status , Patient's Chart, lab work & pertinent test results  History of Anesthesia Complications Negative for: history of anesthetic complications  Airway Mallampati: II  TM Distance: >3 FB Neck ROM: Full    Dental  (+) Teeth Intact, Dental Advisory Given   Pulmonary asthma , former smoker   Pulmonary exam normal breath sounds clear to auscultation       Cardiovascular hypertension, Normal cardiovascular exam+ dysrhythmias Atrial Fibrillation  Rhythm:Regular Rate:Normal  TTE 08/18/23:  1. LV outflow tract peak gradient at rest 138 mmHg. Left ventricular  ejection fraction, by estimation, is 50 to 55%. The left ventricle has low  normal function. The left ventricle has no regional wall motion  abnormalities but there is septal-lateral  dyssynchrony consistent with LBBB. There is moderate asymmetric left  ventricular hypertrophy of the septal segment. Left ventricular diastolic  parameters are consistent with Grade I diastolic dysfunction (impaired  relaxation).   2. Right ventricular systolic function is normal. The right ventricular  size is normal. There is moderately elevated pulmonary artery systolic  pressure. The estimated right ventricular systolic pressure is 55.6 mmHg.   3. Left atrial size was moderately dilated.   4. Mitral valve systolic anterior motion is present. The mitral valve is  abnormal. Moderate eccentric mitral valve regurgitation. No evidence of  mitral stenosis.   5. The aortic valve is tricuspid. There is mild calcification of the  aortic valve. Aortic valve regurgitation is not visualized. No aortic  stenosis is present.   6. The inferior vena cava is normal in size with <50% respiratory  variability, suggesting right atrial pressure of 8 mmHg.   7. This study is consistent with  HOCM.     Neuro/Psych  PSYCHIATRIC DISORDERS Anxiety Depression    negative neurological ROS     GI/Hepatic Neg liver ROS,GERD  ,,  Endo/Other  negative endocrine ROS    Renal/GU Renal InsufficiencyHYDRONEPHROSIS,LEFT  negative genitourinary   Musculoskeletal negative musculoskeletal ROS (+)    Abdominal   Peds  Hematology negative hematology ROS (+)   Anesthesia Other Findings Day of surgery medications reviewed with patient.  Reproductive/Obstetrics                             Anesthesia Physical Anesthesia Plan  ASA: 4  Anesthesia Plan: MAC   Post-op Pain Management: Ofirmev  IV (intra-op)*   Induction:   PONV Risk Score and Plan: 2 and Treatment may vary due to age or medical condition, Ondansetron  and Propofol infusion  Airway Management Planned: Natural Airway and Nasal Cannula  Additional Equipment: None  Intra-op Plan:   Post-operative Plan:   Informed Consent: I have reviewed the patients History and Physical, chart, labs and discussed the procedure including the risks, benefits and alternatives for the proposed anesthesia with the patient or authorized representative who has indicated his/her understanding and acceptance.     Dental advisory given  Plan Discussed with: CRNA and Surgeon  Anesthesia Plan Comments: (Needs cardiology input regarding severe gradient with HOCM prior to anesthesia.)       Anesthesia Quick Evaluation

## 2023-08-20 NOTE — Anesthesia Procedure Notes (Signed)
 Arterial Line Insertion Start/End6/06/2023 2:45 PM, 08/20/2023 2:50 PM Performed by: Grace Laura, MD, anesthesiologist  Patient location: OR. Preanesthetic checklist: patient identified, IV checked, risks and benefits discussed, surgical consent, monitors and equipment checked, pre-op evaluation and timeout performed Right, radial was placed Catheter size: 20 G Hand hygiene performed  Allen's test indicative of satisfactory collateral circulation Attempts: 1 Procedure performed using ultrasound guided (unable to print picture) technique. Following insertion, dressing applied and Biopatch. Post procedure assessment: normal and unchanged  Patient tolerated the procedure well with no immediate complications.

## 2023-08-20 NOTE — Anesthesia Postprocedure Evaluation (Signed)
 Anesthesia Post Note  Patient: Laurie Bowman  Procedure(s) Performed: CYSTOSCOPY, WITH RETROGRADE PYELOGRAM AND URETERAL STENT INSERTION (Right)     Patient location during evaluation: PACU Anesthesia Type: MAC Level of consciousness: awake and alert Pain management: pain level controlled Vital Signs Assessment: post-procedure vital signs reviewed and stable Respiratory status: spontaneous breathing, nonlabored ventilation, respiratory function stable and patient connected to nasal cannula oxygen Cardiovascular status: stable and blood pressure returned to baseline Postop Assessment: no apparent nausea or vomiting Anesthetic complications: no  No notable events documented.  Last Vitals:  Vitals:   08/20/23 1745 08/20/23 1753  BP:    Pulse: 64 65  Resp: 19 (!) 21  Temp:    SpO2: 100% 93%    Last Pain:  Vitals:   08/20/23 1745  TempSrc:   PainSc: 0-No pain                 Rosalita Combe

## 2023-08-20 NOTE — Progress Notes (Signed)
 Progress Note  Patient Name: Laurie Bowman Date of Encounter: 08/20/2023  Primary Cardiologist: Zoe Hinds, MD   Subjective   Patient see and examined at her bedside. Still in sinus rhythm   Inpatient Medications    Scheduled Meds:  amoxicillin-clavulanate  1 tablet Oral Q12H   apixaban   5 mg Oral BID   buPROPion   150 mg Oral Daily   mupirocin ointment  1 Application Nasal BID   pantoprazole   40 mg Oral Daily   phosphorus  500 mg Oral BID   verapamil   120 mg Oral Daily   Continuous Infusions:  amiodarone  Stopped (08/20/23 0740)   magnesium  sulfate bolus IVPB     PRN Meds: acetaminophen    Vital Signs    Vitals:   08/20/23 0400 08/20/23 0529 08/20/23 0730 08/20/23 0838  BP:  137/60 133/66 (!) 147/68  Pulse:  63  65  Resp:  18    Temp: (!) 100.6 F (38.1 C) (!) 97.5 F (36.4 C)    TempSrc:  Oral    SpO2:  97% 100%   Weight:      Height:        Intake/Output Summary (Last 24 hours) at 08/20/2023 1034 Last data filed at 08/20/2023 0500 Gross per 24 hour  Intake 803.47 ml  Output 400 ml  Net 403.47 ml   Filed Weights   08/17/23 1532  Weight: 62.6 kg    Telemetry    Sinus rhythm, heart rate in the 70s- Personally Reviewed  ECG   - Personally Reviewed  Physical Exam   General: Comfortable, lying in the bed Head: Atraumatic, normal size  Eyes: PEERLA, EOMI  Neck: Supple, normal JVD Cardiac: Normal S1, S2; RRR; no murmurs, rubs, or gallops Lungs: Clear to auscultation bilaterally Abd: Soft, nontender, no hepatomegaly  Ext: warm, no edema Skin: Warm and dry, no rashes   Psych: Normal mood and affect   Labs    Chemistry Recent Labs  Lab 08/18/23 0034 08/19/23 0358 08/20/23 0415  NA 127* 130* 130*  K 3.4* 3.3* 3.2*  CL 97* 100 100  CO2 21* 22 21*  GLUCOSE 97 97 110*  BUN 24* 23 17  CREATININE 1.43* 1.18* 1.18*  CALCIUM  8.6* 8.7* 8.2*  PROT 5.9* 5.5* 5.4*  ALBUMIN 2.9* 2.7* 2.5*  AST 60* 78* 71*  ALT 26 38 46*  ALKPHOS 47 44 52   BILITOT 1.8* 1.6* 1.5*  GFRNONAA 37* 46* 46*  ANIONGAP 9 8 9      Hematology Recent Labs  Lab 08/18/23 0034 08/19/23 0358 08/20/23 0415  WBC 9.6 5.4 8.6  RBC 3.61* 3.43* 3.36*  HGB 11.6* 11.0* 10.7*  HCT 35.2* 33.5* 32.5*  MCV 97.5 97.7 96.7  MCH 32.1 32.1 31.8  MCHC 33.0 32.8 32.9  RDW 13.2 13.2 13.3  PLT 158 148* 153    Cardiac EnzymesNo results for input(s): "TROPONINI" in the last 168 hours. No results for input(s): "TROPIPOC" in the last 168 hours.   BNP Recent Labs  Lab 08/17/23 2025  BNP 459.6*     DDimer No results for input(s): "DDIMER" in the last 168 hours.   Radiology    ECHOCARDIOGRAM COMPLETE Result Date: 08/18/2023    ECHOCARDIOGRAM REPORT   Patient Name:   Laurie Bowman Date of Exam: 08/18/2023 Medical Rec #:  [id number]    Height:       60.0 in Accession #:    [id number]   Weight:       138.0 lb Date of Birth:  1940/05/10    BSA:          1.594 m Patient Age:    83 years     BP:           100/52 mmHg Patient Gender: F            HR:           75 bpm. Exam Location:  Inpatient Procedure: 2D Echo, Cardiac Doppler and Color Doppler (Both Spectral and Color            Flow Doppler were utilized during procedure). Indications:    Elevated Troponin  History:        Patient has prior history of Echocardiogram examinations.                 Arrythmias:Atrial Fibrillation; Risk Factors:Hypertension and                 Dyslipidemia.  Sonographer:    Meagan Baucom RDCS, FE, PE Referring Phys: [id number] ARSHAD N KAKRAKANDY IMPRESSIONS  1. LV outflow tract peak gradient at rest 138 mmHg. Left ventricular ejection fraction, by estimation, is 50 to 55%. The left ventricle has low normal function. The left ventricle has no regional wall motion abnormalities but there is septal-lateral dyssynchrony consistent with LBBB. There is moderate asymmetric left ventricular hypertrophy of the septal segment. Left ventricular diastolic parameters are consistent with Grade I diastolic dysfunction  (impaired relaxation).  2. Right ventricular systolic function is normal. The right ventricular size is normal. There is moderately elevated pulmonary artery systolic pressure. The estimated right ventricular systolic pressure is 55.6 mmHg.  3. Left atrial size was moderately dilated.  4. Mitral valve systolic anterior motion is present. The mitral valve is abnormal. Moderate eccentric mitral valve regurgitation. No evidence of mitral stenosis.  5. The aortic valve is tricuspid. There is mild calcification of the aortic valve. Aortic valve regurgitation is not visualized. No aortic stenosis is present.  6. The inferior vena cava is normal in size with <50% respiratory variability, suggesting right atrial pressure of 8 mmHg.  7. 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 FINDINGS  Left Ventricle: LV outflow tract peak gradient at rest 138 mmHg. Left ventricular ejection fraction, by estimation, is 50 to 55%. The left ventricle has low normal function. The left ventricle has no regional wall motion abnormalities. The left ventricular internal cavity size was normal in size. There is moderate asymmetric left ventricular hypertrophy of the septal segment. Left ventricular diastolic parameters are consistent with Grade I diastolic dysfunction (impaired relaxation). Right Ventricle: The right ventricular size is normal. No increase in right ventricular wall thickness. Right ventricular systolic function is normal. There is moderately elevated pulmonary artery systolic pressure. The tricuspid regurgitant velocity is 3.45 m/s, and with an assumed right atrial pressure of 8 mmHg, the estimated right ventricular systolic pressure is 55.6 mmHg. Left Atrium: Left atrial size was moderately dilated. Right Atrium: Right atrial size was normal in size. Pericardium: There is no evidence of pericardial effusion. Mitral Valve: Mitral valve systolic anterior motion is present. The mitral valve is abnormal. Moderate mitral valve  regurgitation. No evidence of mitral valve stenosis. Tricuspid Valve: The tricuspid valve is normal in structure. Tricuspid valve regurgitation is mild. Aortic Valve: The aortic valve is tricuspid. There is mild calcification of the aortic valve. Aortic valve regurgitation is not visualized. No aortic stenosis is present. Pulmonic Valve: The pulmonic valve was normal in structure. Pulmonic valve regurgitation is trivial. Aorta: The aortic  root is normal in size and structure. Venous: The inferior vena cava is normal in size with less than 50% respiratory variability, suggesting right atrial pressure of 8 mmHg. IAS/Shunts: No atrial level shunt detected by color flow Doppler.  LEFT VENTRICLE PLAX 2D LVIDd:         3.50 cm   Diastology LVIDs:         2.70 cm   LV e' medial:    4.68 cm/s LV PW:         1.10 cm   LV E/e' medial:  23.5 LV IVS:        1.50 cm   LV e' lateral:   6.74 cm/s LVOT diam:     1.90 cm   LV E/e' lateral: 16.3 LVOT Area:     2.84 cm  RIGHT VENTRICLE RV Basal diam:  2.60 cm RV Mid diam:    2.00 cm RV S prime:     17.00 cm/s TAPSE (M-mode): 1.3 cm LEFT ATRIUM             Index        RIGHT ATRIUM           Index LA Vol (A2C):   59.5 ml 37.32 ml/m  RA Area:     12.70 cm LA Vol (A4C):   69.2 ml 43.41 ml/m  RA Volume:   28.00 ml  17.56 ml/m LA Biplane Vol: 65.1 ml 40.83 ml/m   AORTA Ao Root diam: 2.60 cm Ao Asc diam:  2.70 cm MITRAL VALVE                TRICUSPID VALVE MV Area (PHT): 3.48 cm     TR Peak grad:   47.6 mmHg MV Decel Time: 218 msec     TR Vmax:        345.00 cm/s MV E velocity: 110.00 cm/s MV A velocity: 118.00 cm/s  SHUNTS MV E/A ratio:  0.93         Systemic Diam: 1.90 cm Dalton McleanMD Electronically signed by Archer Bear Signature Date/Time: 08/18/2023/2:32:04 PM    Final     Cardiac Studies   echo  Patient Profile     83 y.o. female with atrial fibrillation rapid trickle of heart rate, hypertrophic cardiomyopathy  Assessment & Plan    Atrial Fibrillation with  rapid ventricular rate- still in sinus rhythm, continue verapimil. Will transition her to oral amiodarone ; Amiodarone  400 mg BID for 3 days then 200 mg BID for 7 days then 200 mg daily   Electrolyte abnormalities: Hypokalemia and hyomagnesemia - please keep K>4 and mag >2  Hypertrophic cardiomyopathy-recent echo shows significant resting gradient which I do believe this is in volume depletion given her recent nausea vomiting.  She would benefit from some IV fluids.  Will continue to monitor closely.  And it would really be in her best interest to maintain sinus rhythm as well because she is highly symptomatic in afib.  NSTEMI-type II mismatch in the setting of atrial fibrillation at this time will not be pursuing any invasive testing.  Hypertension-blood pressure is at target.  AKI-creatinine stable 1.18  Hyperlipidemia-continue to hold Crestor  for now  Rhabdomyolysis-has gotten some fluid per primary team.  Bladder cancer-getting outpatient treatment     For questions or updates, please contact [facility] Please consult [email or web address] for contact info under Cardiology/STEMI.      Signed, Jaquel Coomer, DO  08/20/2023, 10:34 AM

## 2023-08-20 NOTE — Progress Notes (Signed)
 Post Art. Line placement patient began A fib rhythm, Dr. Gail Joseph at bedside.

## 2023-08-20 NOTE — Progress Notes (Signed)
 Physical Therapy Treatment Patient Details Name: Laurie Bowman MRN: [id number] DOB: 10-16-1940 Today's Date: 08/20/2023   History of Present Illness 83 yo female admitted with ARF, UTI, hyponatremia, delirium. Hx of L TKA, bladder Ca-undergoing tx, cardiomyopathy, lichen slerosus, osteoporosis, breast Ca, IBS, CKD, asthma, rhabdo, falls    PT Comments  Pt agreeable to participating with therapy. She tolerated mobility well on today. Min-Mod A for safe mobility. Pt has a fear of falling. Pt's cognition has improved since initial admission, but she is tangential, demonstrates decreased safety awareness and reasoning/problem solving. Cues for safety throughout session. No family present during session. At this time, will recommend post acute rehab. Pt could potentially return home if she continues to improve and will have sufficient 24/7 supervision and assistance.    If plan is discharge home, recommend the following: A lot of help with walking and/or transfers;A lot of help with bathing/dressing/bathroom;Assistance with cooking/housework;Assist for transportation;Help with stairs or ramp for entrance   Can travel by private vehicle        Equipment Recommendations  None recommended by PT    Recommendations for Other Services       Precautions / Restrictions Precautions Precautions: Fall Restrictions Weight Bearing Restrictions Per Provider Order: No     Mobility  Bed Mobility Overal bed mobility: Needs Assistance Bed Mobility: Supine to Sit, Sit to Supine     Supine to sit: Mod assist, HOB elevated, Used rails Sit to supine: Min assist   General bed mobility comments: Increased time. Assist for trunk and to scoot to EOB with use of bedpad. Assist for LEs back onto bed.    Transfers Overall transfer level: Needs assistance Equipment used: Rolling walker (2 wheels) Transfers: Sit to/from Stand Sit to Stand: Min assist, From elevated surface   Step pivot transfers: Min  assist       General transfer comment: min A for balance and safety, Pt standing from high surfaces so did not need boost - generalized weakness overall    Ambulation/Gait Ambulation/Gait assistance: Min assist Gait Distance (Feet): 3 Feet Assistive device: Rolling walker (2 wheels) Gait Pattern/deviations: Step-through pattern, Decreased stride length       General Gait Details: Assist to steady pt. Cues for safety and to keep hands on RW handles. Pt declined hallway ambulation on today.   Stairs             Wheelchair Mobility     Tilt Bed    Modified Rankin (Stroke Patients Only)       Balance Overall balance assessment: Needs assistance         Standing balance support: Bilateral upper extremity supported, Reliant on assistive device for balance Standing balance-Leahy Scale: Poor                              Communication Communication Communication: No apparent difficulties  Cognition Arousal: Alert Behavior During Therapy: Restless                           PT - Cognition Comments: safety cues throughout Following commands: Intact      Cueing Cueing Techniques: Verbal cues  Exercises      General Comments General comments (skin integrity, edema, etc.): no family present to determine baseline cognition and PLOF      Pertinent Vitals/Pain Pain Assessment Pain Assessment: Faces Faces Pain Scale: Hurts little more Pain Location: R side,  L UE Pain Descriptors / Indicators: Tender, Sore, Discomfort, Guarding Pain Intervention(s): Limited activity within patient's tolerance, Monitored during session, Repositioned    Home Living Family/patient expects to be discharged to:: Private residence Living Arrangements: Spouse/significant other Available Help at Discharge: Family;Available 24 hours/day Type of Home: House Home Access: Stairs to enter Entrance Stairs-Rails: Right;Left Entrance Stairs-Number of Steps:  3 Alternate Level Stairs-Number of Steps: 1 flight Home Layout: Two level;Bed/bath upstairs Home Equipment: Shower seat;Grab bars - toilet;Rolling Walker (2 wheels);Cane - single point;Grab bars - tub/shower Additional Comments: spouse uses RW, full bath available downstairs    Prior Function            PT Goals (current goals can now be found in the care plan section) Progress towards PT goals: Progressing toward goals    Frequency    Min 3X/week      PT Plan      Co-evaluation   Reason for Co-Treatment: For patient/therapist safety;To address functional/ADL transfers PT goals addressed during session: Mobility/safety with mobility;Balance;Proper use of DME;Strengthening/ROM OT goals addressed during session: ADL's and self-care;Strengthening/ROM      AM-PAC PT "6 Clicks" Mobility   Outcome Measure  Help needed turning from your back to your side while in a flat bed without using bedrails?: A Little Help needed moving from lying on your back to sitting on the side of a flat bed without using bedrails?: A Little Help needed moving to and from a bed to a chair (including a wheelchair)?: A Little Help needed standing up from a chair using your arms (e.g., wheelchair or bedside chair)?: A Little Help needed to walk in hospital room?: A Lot Help needed climbing 3-5 steps with a railing? : A Lot 6 Click Score: 16    End of Session Equipment Utilized During Treatment: Gait belt Activity Tolerance: Patient tolerated treatment well;Patient limited by fatigue;Patient limited by pain Patient left: in bed;with call bell/phone within reach;with bed alarm set   PT Visit Diagnosis: Muscle weakness (generalized) (M62.81);History of falling (Z91.81);Difficulty in walking, not elsewhere classified (R26.2)     Time: 7829-5621 PT Time Calculation (min) (ACUTE ONLY): 38 min  Charges:    $Gait Training: 8-22 mins PT General Charges $$ ACUTE PT VISIT: 1 Visit                         Tanda Falter, PT Acute Rehabilitation  Office: [phone]

## 2023-08-20 NOTE — Evaluation (Signed)
 Occupational Therapy Evaluation Patient Details Name: Laurie Bowman MRN: [id number] DOB: 1941/02/07 Today's Date: 08/20/2023   History of Present Illness   83 yo female admitted with ARF, UTI, hyponatremia, delirium. Hx of L TKA, bladder Ca-undergoing tx, cardiomyopathy, lichen slerosus, osteoporosis, breast Ca, IBS, CKD, asthma, rhabdo, falls     Clinical Impressions Pt reports being mod I for ADL and mobility. At this time patient is unreliable historian and no family present to confirm set up/PLOF. Today Pt is min A for transfers and step pivot. Pt demonstrating very high fear of falling. Pt cognition has improved since initial admission, but she is tangential, demonstrates decreased safety awareness and reasoning/problem solving. Pt able to participate in seated grooming at the EOB, toilet transfer, peri care. Pt needed up to max A for LB ADL, up to mod A for UB ADL. Without knowing her level of support at home, at this time recommending post-acute rehab of <3 hour daily to maximize safety and independence in ADL and functional transfers. If pt will have capable 24/7 support she could improve to HHOT.      If plan is discharge home, recommend the following:   A little help with walking and/or transfers;A lot of help with bathing/dressing/bathroom;Assistance with cooking/housework;Direct supervision/assist for financial management;Direct supervision/assist for medications management;Assist for transportation;Supervision due to cognitive status     Functional Status Assessment   Patient has had a recent decline in their functional status and demonstrates the ability to make significant improvements in function in a reasonable and predictable amount of time.     Equipment Recommendations   None recommended by OT (Pt has appropriate DME)     Recommendations for Other Services   PT consult     Precautions/Restrictions   Precautions Precautions: Fall Recall of  Precautions/Restrictions: Impaired Restrictions Weight Bearing Restrictions Per Provider Order: No     Mobility Bed Mobility Overal bed mobility: Needs Assistance Bed Mobility: Supine to Sit, Sit to Supine     Supine to sit: HOB elevated, Used rails, Min assist (min A to use bed pad to facilitate hips to EOB) Sit to supine: Min assist (for BLE back into bed)   General bed mobility comments: increased time required    Transfers Overall transfer level: Needs assistance Equipment used: Rolling walker (2 wheels) Transfers: Sit to/from Stand, Bed to chair/wheelchair/BSC Sit to Stand: Min assist, From elevated surface     Step pivot transfers: Min assist, From elevated surface     General transfer comment: min A for balance and safety, Pt standing from high surfaces so did not need boost - generalized weakness overall      Balance Overall balance assessment: Needs assistance Sitting-balance support: Feet unsupported Sitting balance-Leahy Scale: Fair     Standing balance support: Bilateral upper extremity supported, Reliant on assistive device for balance Standing balance-Leahy Scale: Poor                             ADL either performed or assessed with clinical judgement   ADL Overall ADL's : Needs assistance/impaired Eating/Feeding: NPO   Grooming: Wash/dry face;Oral care;Set up;Sitting Grooming Details (indicate cue type and reason): EOB Upper Body Bathing: Minimal assistance Upper Body Bathing Details (indicate cue type and reason): for back Lower Body Bathing: Moderate assistance Lower Body Bathing Details (indicate cue type and reason): knees down Upper Body Dressing : Contact guard assist   Lower Body Dressing: Moderate assistance Lower Body Dressing Details (indicate  cue type and reason): socks Toilet Transfer: Minimal assistance;Stand-pivot;BSC/3in1;Rolling walker (2 wheels) Toilet Transfer Details (indicate cue type and reason): min A for boost  and balance Toileting- Clothing Manipulation and Hygiene: Maximal assistance;Sit to/from stand Toileting - Clothing Manipulation Details (indicate cue type and reason): Pt able to maintain standing and OT performed peri care of front and back "I can't do that right now"     Functional mobility during ADLs: Contact guard assist;Rolling walker (2 wheels) (SPT only) General ADL Comments: decreased activity tolerance, decreased safety awareness, unrealiable historian     Vision Baseline Vision/History: 1 Wears glasses Patient Visual Report: No change from baseline       Perception         Praxis         Pertinent Vitals/Pain Pain Assessment Pain Assessment: Faces Faces Pain Scale: Hurts even more Pain Location: R side Pain Descriptors / Indicators: Tender, Sore, Discomfort, Guarding Pain Intervention(s): Monitored during session, Repositioned     Extremity/Trunk Assessment Upper Extremity Assessment Upper Extremity Assessment: Generalized weakness   Lower Extremity Assessment Lower Extremity Assessment: Defer to PT evaluation   Cervical / Trunk Assessment Cervical / Trunk Assessment: Kyphotic   Communication Communication Communication: No apparent difficulties   Cognition Arousal: Alert Behavior During Therapy: Restless Cognition: Cognition impaired, No family/caregiver present to determine baseline     Awareness: Intellectual awareness intact, Online awareness impaired Memory impairment (select all impairments): Short-term memory Attention impairment (select first level of impairment): Sustained attention Executive functioning impairment (select all impairments): Organization, Reasoning, Problem solving OT - Cognition Comments: Pt reports not remembering talking to MDs, she was jumping from topic to topic, unable to focus, tangential, decreased safety awareness, "my brain has not been right since i had bladder surgery"                 Following commands:  Intact       Cueing  General Comments   Cueing Techniques: Verbal cues  no family present to determine baseline cognition and PLOF   Exercises     Shoulder Instructions      Home Living Family/patient expects to be discharged to:: Private residence Living Arrangements: Spouse/significant other Available Help at Discharge: Family;Available 24 hours/day Type of Home: House Home Access: Stairs to enter [employer] of Steps: 3 Entrance Stairs-Rails: Right;Left Home Layout: Two level;Bed/bath upstairs Alternate Level Stairs-Number of Steps: 1 flight Alternate Level Stairs-Rails: Right Bathroom Shower/Tub: Tub/shower unit   Bathroom Toilet: Handicapped height     Home Equipment: Shower seat;Grab bars - toilet;Rolling Environmental consultant (2 wheels);Cane - single point;Grab bars - tub/shower   Additional Comments: spouse uses RW, full bath available downstairs      Prior Functioning/Environment Prior Level of Function : History of Falls (last six months);Independent/Modified Independent             Mobility Comments: ind. uses RW when she has episodes of weakness ADLs Comments: mod ind    OT Problem List: Decreased activity tolerance;Impaired balance (sitting and/or standing);Decreased safety awareness;Decreased cognition;Cardiopulmonary status limiting activity   OT Treatment/Interventions: Self-care/ADL training;Energy conservation;DME and/or AE instruction;Therapeutic activities;Patient/family education;Balance training      OT Goals(Current goals can be found in the care plan section)   Acute Rehab OT Goals Patient Stated Goal: get her sx over with OT Goal Formulation: With patient Time For Goal Achievement: 09/03/23 Potential to Achieve Goals: Good ADL Goals Pt Will Perform Grooming: with supervision;standing Pt Will Perform Upper Body Dressing: with supervision;sitting Pt Will Perform Lower Body  Dressing: with supervision;sit to/from stand Pt Will Transfer  to Toilet: with supervision;ambulating Pt Will Perform Toileting - Clothing Manipulation and hygiene: with supervision;sit to/from stand Additional ADL Goal #1: Pt will verbalize at least 3 strategies for energy conservation during ADL with no cues   OT Frequency:  Min 2X/week    Co-evaluation PT/OT/SLP Co-Evaluation/Treatment: Yes Reason for Co-Treatment: For patient/therapist safety;To address functional/ADL transfers PT goals addressed during session: Mobility/safety with mobility;Balance;Proper use of DME;Strengthening/ROM OT goals addressed during session: ADL's and self-care;Strengthening/ROM      AM-PAC OT "6 Clicks" Daily Activity     Outcome Measure Help from another person eating meals?: Total (NPO) Help from another person taking care of personal grooming?: A Little Help from another person toileting, which includes using toliet, bedpan, or urinal?: A Lot Help from another person bathing (including washing, rinsing, drying)?: A Lot Help from another person to put on and taking off regular upper body clothing?: A Little Help from another person to put on and taking off regular lower body clothing?: A Lot 6 Click Score: 13   End of Session Equipment Utilized During Treatment: Rolling walker (2 wheels) Nurse Communication: Mobility status;Precautions  Activity Tolerance: Patient tolerated treatment well Patient left: in bed;with call bell/phone within reach;with nursing/sitter in room;with bed alarm set  OT Visit Diagnosis: Unsteadiness on feet (R26.81);Muscle weakness (generalized) (M62.81);History of falling (Z91.81);Other symptoms and signs involving cognitive function                Time: 1025-1107 OT Time Calculation (min): 42 min Charges:  OT General Charges $OT Visit: 1 Visit OT Evaluation $OT Eval Moderate Complexity: 1 Mod OT Treatments $Self Care/Home Management : 8-22 mins  Chales [place] OTR/L Acute Rehabilitation Services Office: [phone]  Ebony Goldstein  [facility] 08/20/2023, 12:36 PM

## 2023-08-20 NOTE — Progress Notes (Signed)
 PROGRESS NOTE Laurie Bowman  ZHY:[id number] DOB: January 20, 1941 DOA: 08/17/2023 PCP: Sheilah Denver., MD  Brief Narrative/Hospital Course: 83 y.o. female with history of hypertrophic cardiomyopathy, paroxysmal atrial fibrillation, hyperlipidemia, recently diagnosed bladder cancer underwent BCG treatment last week at [facility] since then has been feeling weak tired fatigued poor appetite and also has been having increasing pain on micturition.  Had subjective feeling of fever chills.  Patient felt so weak today that she slid out of the bed did not hit her head or lose consciousness.  She's been admitted with general malaise, AKI.  CT with findings possibly concerning for a UTI.  Cardiology following for afib with RVR, elevated troponin.  Urology planning for stent.      Subjective: Patient seen and examined this morning Overnight fever up to 102.4 BP 130s to 160s systolic, on nasal cannula. Labs shows hyponatremia hypokalemia 3.2 creatinine about the same post 2.0 mag 1.3 mild transaminitis CK3 3 3 CBC with anemia thrombocytopenia improved. Left arm was infiltrated amiodarone  and has been stopped IV line removed amiodarone  switched to p.o., awaiting for IV access by IV team as she is a difficult to stick Left arm iv site as not as swollen as before ,mildly red.  Assessment and plan:  E coli UTI  Possible Pyelonephritis Right Sided Hydronephrosis Generalized Malaise  CT with trace gas in bladder and subtle perivesical stranding, mild right hydroureteronephrosis Pansensitive e. Coli on urine culture. Blood culture NGTD [place]  Continue antibiotic -it has been changed to Augmentin - Patient has cephalosporin allergy. There is a plan for stent eplacement today.  Hyponatremia Hypokalemia Hypophosphatemia Hypomagnesemia: Will replace her electrolytes, monitor.  Currently n.p.o.  Normocytic anemia: Hemoglobin holding around 10 to 11 g range.  Monitor  A-fib with RVR  Patient had RVR prior  to procedure, TSH normal amiodarone  transition to p.o., continue verapamil , DOAC as per cardiology.     AKI: likely from poor oral intake and dehydration.CT with mild R hydroureteronephrosis (possibly with recent passage of stone or infection) AKI improved with IV fluids, encourage oral hydration    Acute Metabolic Encephalopathy  Suspect due to above-with UTI dehydration. continue delirium precaution fall precaution supportive care   Elevated troponin: Seen by cardiology echo done suspecting demand ischemia continue Eliquis  and verapamil  as ordered  Mild rhabdomyolysis  Elevated CK  Trend. Cont holding statin UA concerning for myoglobinuria with moderate Hb and 0-5 RBC's  Bladder cancer S/p TURBT 06/2023, intravesicular BCG last week    Hyperlipidemia: hold statins until CK levels improved. Ck down in 333, oeak 1770   Hypertension Stable continue verapamil . Holding thiazide, holding minoxidil    Hypertrophic cardiomyopathy  Caution with volume    Depression Stable on Wellbutrin .   DVT prophylaxis: eliquis  Code Status:   Code Status: Full Code Family Communication: plan of care discussed with patient at bedside. Patient status is: Remains hospitalized because of severity of illness Level of care: Progressive   Dispo: The patient is from: home            Anticipated disposition: TBD Objective: Vitals last 24 hrs: Vitals:   08/20/23 0400 08/20/23 0529 08/20/23 0730 08/20/23 0838  BP:  137/60 133/66 (!) 147/68  Pulse:  63  65  Resp:  18    Temp: (!) 100.6 F (38.1 C) (!) 97.5 F (36.4 C)  98 F (36.7 C)  TempSrc:  Oral    SpO2:  97% 100%   Weight:      Height:  Physical Examination: General exam: alert awake, older than stated age HEENT:Oral mucosa moist, Ear/Nose WNL grossly Respiratory system: Bilaterally clear BS, no use of accessory muscle Cardiovascular system: S1 & S2 +. Gastrointestinal system: Abdomen soft, NT,ND,BS+ Nervous System: Alert,  awake,  following commands. Extremities: LE edema neg, warm extremities Skin: No rashes,warm.  Mild swelling and redness on the left arm IV site MSK: Normal muscle bulk/tone.   Data Reviewed: I have personally reviewed following labs and imaging studies ( see epic result tab) CBC: Recent Labs  Lab 08/17/23 1612 08/18/23 0034 08/19/23 0358 08/20/23 0415  WBC 11.2* 9.6 5.4 8.6  NEUTROABS  --  8.0* 4.3 7.1  HGB 13.1 11.6* 11.0* 10.7*  HCT 39.5 35.2* 33.5* 32.5*  MCV 95.4 97.5 97.7 96.7  PLT 178 158 148* 153   CMP: Recent Labs  Lab 08/17/23 1612 08/18/23 0034 08/19/23 0358 08/20/23 0415  NA 129* 127* 130* 130*  K 3.6 3.4* 3.3* 3.2*  CL 95* 97* 100 100  CO2 21* 21* 22 21*  GLUCOSE 94 97 97 110*  BUN 24* 24* 23 17  CREATININE 1.46* 1.43* 1.18* 1.18*  CALCIUM  9.5 8.6* 8.7* 8.2*  MG  --   --  1.9 1.6*  PHOS  --   --  3.2 2.0*   GFR: Estimated Creatinine Clearance: 30.9 mL/min (A) (by C-G formula based on SCr of 1.18 mg/dL (H)). Recent Labs  Lab 08/17/23 1612 08/18/23 0034 08/19/23 0358 08/20/23 0415  AST 36 60* 78* 71*  ALT 23 26 38 46*  ALKPHOS 55 47 44 52  BILITOT 2.5* 1.8* 1.6* 1.5*  PROT 7.0 5.9* 5.5* 5.4*  ALBUMIN 3.6 2.9* 2.7* 2.5*    Recent Labs  Lab 08/17/23 1612  LIPASE 21   No results for input(s): "AMMONIA" in the last 168 hours. Coagulation Profile: No results for input(s): "INR", "PROTIME" in the last 168 hours. Unresulted Labs (From admission, onward)     Start     Ordered   08/21/23 0500  Basic metabolic panel with GFR  Daily,   R     Question:  Specimen collection method  Answer:  Lab=Lab collect   08/20/23 1059   08/21/23 0500  CBC  Daily,   R     Question:  Specimen collection method  Answer:  Lab=Lab collect   08/20/23 1059           Antimicrobials/Microbiology: Anti-infectives (From admission, onward)    Start     Dose/Rate Route Frequency Ordered Stop   08/19/23 2200  amoxicillin-clavulanate (AUGMENTIN) 875-125 MG per tablet 1  tablet       Note to Pharmacy: Retime as appropriate based on last levaquin dose   1 tablet Oral Every 12 hours 08/19/23 1604     08/19/23 1800  levofloxacin (LEVAQUIN) IVPB 750 mg  Status:  Discontinued        750 mg 100 mL/hr over 90 Minutes Intravenous Every 48 hours 08/17/23 2305 08/18/23 0310   08/19/23 1800  levofloxacin (LEVAQUIN) IVPB 500 mg  Status:  Discontinued        500 mg 100 mL/hr over 60 Minutes Intravenous Every 48 hours 08/18/23 0310 08/19/23 1604   08/19/23 0730  ceFAZolin (ANCEF) IVPB 2g/100 mL premix  Status:  Discontinued        2 g 200 mL/hr over 30 Minutes Intravenous  Once 08/19/23 0724 08/19/23 1300   08/19/23 0704  ceFAZolin (ANCEF) 2-4 GM/100ML-% IVPB  Status:  Discontinued  Note to Pharmacy: Sim Dryer: cabinet override      08/19/23 0704 08/19/23 1241   08/17/23 1815  levofloxacin (LEVAQUIN) IVPB 500 mg        500 mg 100 mL/hr over 60 Minutes Intravenous  Once 08/17/23 1808 08/17/23 1919         Component Value Date/Time   SDES  08/18/2023 1241    BLOOD BLOOD RIGHT ARM Performed at [facility], [address] [place], [place] [address]    SPECREQUEST  08/18/2023 1241    BOTTLES DRAWN AEROBIC AND ANAEROBIC Blood Culture adequate volume Performed at [facility], [address] [place], [place] [address]    CULT  08/18/2023 1241    NO GROWTH < 24 HOURS Performed at [facility], [address] [place], [place] [address]    REPTSTATUS PENDING 08/18/2023 1241    Procedures: Procedure(s) (LRB): CYSTOSCOPY, WITH STENT INSERTION (Right) Medications reviewed:  Scheduled Meds:  [START ON 08/22/2023] amiodarone   200 mg Oral BID   [START ON 08/29/2023] amiodarone   200 mg Oral Daily   amiodarone   400 mg Oral BID   amoxicillin-clavulanate  1 tablet Oral Q12H   apixaban   5 mg Oral BID   buPROPion   150 mg Oral Daily   mupirocin ointment  1 Application Nasal BID   pantoprazole   40 mg Oral Daily    phosphorus  500 mg Oral BID   verapamil   120 mg Oral Daily   Continuous Infusions:  magnesium  sulfate bolus IVPB      Ayza Ripoll, MD [facility] 08/20/2023, 11:12 AM

## 2023-08-20 NOTE — Interval H&P Note (Signed)
 History and Physical Interval Note:  08/20/2023 4:02 PM  Laurie Bowman  has presented today for surgery, with the diagnosis of RIGHT URETERAL STONE.  The various methods of treatment have been discussed with the patient and family. After consideration of risks, benefits and other options for treatment, the patient has consented to  Procedure(s): CYSTOSCOPY, WITH RETROGRADE PYELOGRAM AND URETERAL STENT INSERTION (Right) as a surgical intervention.  The patient's history has been reviewed, patient examined, no change in status, stable for surgery.  I have reviewed the patient's chart and labs.  Questions were answered to the patient's satisfaction.     Andrez Banker

## 2023-08-20 NOTE — Op Note (Signed)
 Preoperative diagnosis:  Right hydronephrosis  Postoperative diagnosis:  Same  Procedure:  Cystoscopy Failed ureteral stent placement  Surgeon: Andrez Banker, MD  Anesthesia: General  Complications: None  Intraoperative findings:  1: The patient's right ureteral orifice was not visible, there was scar tissue overlying the right trigone.  The left ureteral orifice was easily visualized.  We gave fluorescein and I was unable to appreciate any E flux of the fluorescein from the right side.  I did probe around using a wire and also a rigid ureteroscope unsuccessfully trying to unroofed the lumen of the meatus of the right ureteral orifice.  After about 45 minutes I stopped.  EBL: Minimal  Specimens: None  Indication: Laurie Bowman is a 83 y.o. patient with history of bladder cancer.  She recently had a TURBT of the tumor which was overlying the right ureteral orifice.  The patient then subsequently underwent a induction round of BCG, and since then has been having significant fatigue and failure to thrive.  She also has had significant cardiac issues and altered mental status.  CT scan demonstrated a obstructed right kidney.  As part of that I offered attempt at placing a right ureteral stent.  The first attempt was canceled because of the patient's A-fib with RVR.  Despite this, we discussed the second attempt as well as the risk and the benefits.  Having gone through all of it.  She has opted to proceed.  Description of procedure:  The patient was taken to the operating room and she was placed under MAC anesthesia.  The patient was placed in the dorsal lithotomy position, prepped and draped in the usual sterile fashion, and preoperative antibiotics were administered. A preoperative time-out was performed.   Cystourethroscopy was performed.  The cystoscopic findings were noted as above.  I then used a sensor wire to probe around in the right trigone unsuccessfully.  I then advanced an  open-ended over the sensor wire and again for some more resistance to try to unroofed the ureteral orifice unsuccessfully.  I then attempted to use the semirigid scope for even more resistance or firmness to try and open up or delineate the right ureteral orifice.  I gave the patient fluorescein and while there was a significant E flux of the fluorescein from the patient's left ureteral orifice there was nothing coming from the right side.  After about 45 minutes of attempts I was unsuccessful and opted to stop any further attempts of accessing the right ureteral orifice and wake the patient up.  The patient did tolerate the procedure quite well.  She was awoken and returned to the PACU in stable condition.  Disposition: The patient will need a nephrostomy tube.   Andrez Banker, M.D.

## 2023-08-20 NOTE — Hospital Course (Addendum)
 83 y.o. female with history of hypertrophic cardiomyopathy, paroxysmal atrial fibrillation, hyperlipidemia, recently diagnosed bladder cancer underwent BCG treatment last week at [facility] since then has been feeling weak tired fatigued poor appetite and also has been having increasing pain on micturition.  Had subjective feeling of fever chills.  Patient felt so weak today that she slid out of the bed did not hit her head or lose consciousness.  She's been admitted with general malaise, AKI.  CT with findings possibly concerning for a UTI.  Cardiology following for afib with RVR, elevated troponin.  Urology planning for stent-attempted but unsuccessful> subsequently nephrostomy catheter placed by IR on the right IR has cleared her for discharge >No additional VIR intervention during current hospitalization. She will return for routine exchange, possible internalization in 6-8wks. some abdominal discomfort CT abdomen 6/9 no new changes.  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  Patient has been approved for skilled nursing facility and has a bed available.  Subjective: Seen examined Aaox3 Feels better  Stool not as liquidy Overnight afebrile BP stable BM x 2 charted Feels ready to go to snf today> but was having nausea and bloating-Zofran  given, discharge held  Assessment and plan   E coli UTI  Possible Pyelonephritis Right Sided Hydronephrosis Generalized Malaise  CT with trace gas in bladder and subtle perivesical stranding, mild right hydroureteronephrosis  Pansensitive e. Coli on urine culture. Blood culture NGTD> managed w/ Augmentin . Urology planned  stent-but unsuccessful as could not find the orifice> subsequently nephrostomy catheter placed by IR  Back on  Eliquis  after discussion with IR 6/9. Okay for discharge plan to follow-up with IR in 6 to 8 weeks for possible internalization    Hypokalemia: Hyponatremia Hypophosphatemia Hypomagnesemia: Electrolytes stable/ cont her kdur daily at snf  Normocytic anemia: Hemoglobin holding ~ 10 to 11 g range.  Continue to monitor   A-fib with RVR  Patient had RVR prior to procedure, TSH normal amiodarone  transitioned to p.o, Eliquis , verapamil .   History of IBS-abdominal cramps/distention/ liquid stool/Nausea: CT abdomen 6/9 no acute finding continue symptomatic management.  No leukocytosis fever, have bloating, nausea continue symptomatic management will obtain labs this afternoon, if remains symptomatic will need IV access and IV fluids.  AKI on CKD 3a: Baseline creatinine as low as 0.9, creatinine peaked at 1.4 indicating AKI-likely from poor oral intake and dehydration,?Obstructive uropathy creatinine has improved. CT with mild R hydroureteronephrosis (possibly with recent passage of stone or infection). Check labs today Recent Labs    08/17/23 1612 08/18/23 0034 08/19/23 0358 08/20/23 0415 08/21/23 0420 08/22/23 0348 08/23/23 0418 08/24/23 0351 08/25/23 0410 08/25/23 0411 08/26/23 0418  BUN 24* 24* 23 17 15 18 15 14 14   --  11  CREATININE 1.46* 1.43* 1.18* 1.18* 0.99 1.04* 1.16* 1.04* 1.11* 1.06* 1.25*  CO2 21* 21* 22 21* 22 22 25 25 27   --  26  K 3.6 3.4* 3.3* 3.2* 3.0* 3.0* 3.6 3.0* 3.5  --  3.6    Acute Metabolic Encephalopathy  Suspect due to above-with UTI dehydration.  Mentation improved to baseline AAO x 3   Elevated troponin: Seen by cardiology echo done suspect demand ischemia   Mild rhabdomyolysis  Elevated CK:  CK improved, continue statin   Bladder cancer S/p TURBT 06/2023, intravesicular BCG last week    Hyperlipidemia: Resime statins   Hypertension Stable continue verapamil . resume thiazide as BP trending up. Cont holding minoxidil    Hypertrophic cardiomyopathy  Caution with volume  Depression Stable on Wellbutrin .  Deconditioning/debility: Continue PT OT plan for skilled  nursing.

## 2023-08-20 NOTE — H&P (Signed)
 I have been asked to see the patient by Dr. Donnetta Gains, for evaluation and management of right hydronephrosis.   History of present illness: 83 year old female who has a history of nonmuscle invasive low-grade bladder cancer presented to the emergency department after receiving 1 dose of intravesical BCG followed by general malaise, weakness, chills and failure to thrive.  She had a CT scan in the emergency department demonstrating right hydroureteronephrosis down to the bladder.  Her labs demonstrate acute renal failure and hyponatremia.  She was noted to be in A-fib with RVR.  She had some confusion.   Patient had her bladder tumor resected on April 15.  She had postoperative gemcitabine instillation into her bladder.  She subsequently started BCG last week.  The patient is complaining of diffuse abdominal pain and feeling lousy with no energy.   Review of systems: A 12 point comprehensive review of systems was obtained and is negative unless otherwise stated in the history of present illness.       Patient Active Problem List    Diagnosis Date Noted   Generalized weakness 08/18/2023   ARF (acute renal failure) (HCC) 08/17/2023   Atrial fibrillation with RVR (HCC) 08/17/2023   Elevated troponin 08/17/2023   UTI (urinary tract infection) 08/17/2023   Hyponatremia 08/17/2023   LV dysfunction 10/03/2022   PAF (paroxysmal atrial fibrillation) (HCC) 10/03/2022   Paroxysmal atrial fibrillation with RVR (HCC) 08/31/2021   Rhabdomyolysis 08/30/2021   Fracture, Colles, left, closed 05/03/2021   Aortic atherosclerosis (HCC) 09/27/2020   Elevated WBC count 09/27/2020   Hypokalemia 09/27/2020   Infection due to Staphylococcus epidermidis 09/27/2020   Other chest pain 09/27/2020   Urticaria     Cancer (HCC)     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   Mitral regurgitation 04/30/2018   LBBB (left bundle branch block) 04/30/2018   PVC's (premature ventricular contractions) 04/30/2018    Asthma with acute exacerbation 06/10/2016   Acute sinusitis 06/10/2016   GERD (gastroesophageal reflux disease) 02/06/2016   Other specified disorders of eyelid 11/09/2015   Allergic rhinitis 09/28/2015   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 06/21/2015   Encounter for long-term (current) use of high-risk medication 06/20/2015   Wound dehiscence 05/08/2015   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   Peripheral visual field defect of both eyes 04/21/2015   Dermatochalasis of both upper eyelids 04/14/2015   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   Asthma 12/16/2013   Benign neoplasm of colon 12/16/2013   Constipation 12/16/2013   Dizziness 12/16/2013   Fatty (change of) liver, not elsewhere classified 12/16/2013   Undiagnosed cardiac murmurs 12/16/2013   Heart murmur, systolic 12/16/2013   Hyperlipidemia, unspecified 12/16/2013   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   Anxiety and depression 12/16/2013   Pain in unspecified knee 12/16/2013   Carpal tunnel syndrome 12/16/2013   Rotator cuff tendonitis 12/16/2013   Ventricular premature depolarization 12/16/2013   Abnormal mammogram 08/16/2013   Hypertension 07/08/2011   Fibrocystic breast changes 07/08/2011      Medications Ordered Prior to Encounter  No current facility-administered medications on file prior to encounter.          Current Outpatient Medications on File Prior to Encounter  Medication Sig Dispense Refill   acetaminophen  (TYLENOL ) 500 MG tablet Take 500 mg by mouth every 4 (four) hours as needed for moderate pain (pain score 4-6).       albuterol  (PROVENTIL ) (2.5 MG/3ML) 0.083% nebulizer solution Take 3 mLs (2.5 mg total) by nebulization  every 6 (six) hours as needed for wheezing or shortness of breath. 75 mL 0   albuterol  (VENTOLIN  HFA) 108 (90 Base) MCG/ACT inhaler USE 1-2 PUFFS EVERY 4-6 HOURS AS NEEDED FOR COUGH OR WHEEZE. 8.5 each 1   apixaban  (ELIQUIS ) 5 MG TABS tablet Take 1 tablet (5 mg  total) by mouth 2 (two) times daily. 180 tablet 1   augmented betamethasone dipropionate (DIPROLENE-AF) 0.05 % cream Apply 0.05 application  topically daily as needed (Lichen).       benzonatate  (TESSALON ) 100 MG capsule Take 100 mg by mouth daily as needed for cough.       buPROPion  (WELLBUTRIN  XL) 150 MG 24 hr tablet Take 150 mg by mouth daily.       Cholecalciferol  (VITAMIN D) 50 MCG (2000 UT) CAPS Take 2,000 Units by mouth daily.       clobetasol cream (TEMOVATE) 0.05 % Apply 1 application  topically daily as needed (Scratches).       clotrimazole-betamethasone (LOTRISONE) cream Apply 1 application  topically daily as needed (Fugus).       diclofenac Sodium (VOLTAREN) 1 % GEL Apply 2 g topically 4 (four) times daily as needed (Pain). Both knees       dicyclomine  (BENTYL ) 20 MG tablet Take 20 mg by mouth daily as needed for spasms.       fexofenadine (ALLEGRA) 180 MG tablet Take 180 mg by mouth daily.        fluticasone  (FLOVENT  HFA) 110 MCG/ACT inhaler Use two puffs twice daily for one to two weeks during flares (Patient taking differently: Inhale 2 puffs into the lungs daily as needed (Asthma).) 1 each 3   hydrochlorothiazide  (HYDRODIURIL ) 25 MG tablet Take 1 tablet (25 mg total) by mouth daily. 30 tablet 0   lansoprazole  (PREVACID ) 30 MG capsule Take 1 capsule (30 mg total) by mouth 2 (two) times daily before a meal. 30 capsule 5   minoxidil (LONITEN) 2.5 MG tablet Take 1.25 mg by mouth daily.       mometasone  (NASONEX ) 50 MCG/ACT nasal spray Place 2 sprays into the nose daily. (Patient taking differently: Place 2 sprays into the nose daily as needed (Rhinitis).) 34 each 2   montelukast  (SINGULAIR ) 10 MG tablet Take 1 tablet (10 mg total) by mouth at bedtime. 90 tablet 1   olopatadine  (PATANOL) 0.1 % ophthalmic solution Place 1 drop into both eyes 2 (two) times daily as needed. 5 mL 5   Polyethylene Glycol 3350  (PEG 3350 ) POWD Take 17 g by mouth daily.        potassium chloride  (KLOR-CON ) 10  MEQ tablet Take 10-20 mEq by mouth See admin instructions. Take 10 mEq (1 tablet) by mouth every morning and 20 mEq (2 tablets) every night.       rosuvastatin  (CRESTOR ) 10 MG tablet Take 10 mg by mouth daily.       tiZANidine (ZANAFLEX) 4 MG tablet Take 2 mg by mouth at bedtime as needed (Sleep).       triamcinolone cream (KENALOG) 0.1 % Apply 1 application  topically 2 (two) times daily as needed (Eyes).       verapamil  (CALAN -SR) 180 MG CR tablet Take 180 mg by mouth daily.       oxybutynin (DITROPAN-XL) 10 MG 24 hr tablet Take 1 tablet by mouth daily. (Patient not taking: Reported on 08/18/2023)                Past Medical History:  Diagnosis Date   Abnormal  mammogram 08/16/2013   Acute sinusitis 06/10/2016   Allergic rhinitis 09/28/2015   Alopecia 12/16/2013   Aortic atherosclerosis (HCC) 09/27/2020   Asthma     Asthma with acute exacerbation 06/10/2016   Benign essential hypertension 07/08/2011   Benign neoplasm of colon 12/16/2013   Cancer (HCC)     Carpal tunnel syndrome 12/16/2013   Constipation 12/16/2013   Cough 10/15/2016   Cough 10/15/2016   CRF (chronic renal failure), stage 2 (mild) 05/09/2019   Dermatochalasis of both upper eyelids 04/14/2015   Dizziness 12/16/2013   Elevated WBC count 09/27/2020   Encounter for long-term (current) use of high-risk medication 06/20/2015   Family history of breast cancer in sister 07/08/2011   Family history of breast cancer in sister 07/08/2011   Fatty (change of) liver, not elsewhere classified 12/16/2013    Overview:  Overview:  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 Possibly aggravated by Tamoxifen   Fibrocystic breast changes 07/08/2011   Fracture, Colles, left, closed 05/03/2021   GERD (gastroesophageal reflux disease) 02/06/2016   Heart murmur, systolic 12/16/2013    Overview:  Overview:  Mild MR/TR, echo 2007 without change since 2001   Hyperlipidemia  LDL goal <70 12/16/2013   Hyperlipidemia, unspecified 12/16/2013   Hypertension     Hypertensive heart disease 04/30/2018   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   Hypokalemia 09/27/2020   Infection due to Staphylococcus epidermidis 09/27/2020   Irritable bowel syndrome 12/16/2013   LBBB (left bundle branch block) 04/30/2018   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   Major depressive disorder, recurrent, unspecified (HCC) 12/16/2013   Mitral regurgitation 04/30/2018   Moderate persistent asthma 09/28/2015   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 06/21/2015   Other chest pain 09/27/2020   Other specified disorders of eyelid 11/09/2015   Pain in unspecified knee 12/16/2013   Peripheral visual field defect of both eyes 04/21/2015   PVC's (premature ventricular contractions) 04/30/2018   Rotator cuff tendonitis 12/16/2013   Undiagnosed cardiac murmurs 12/16/2013    Overview:  Mild MR/TR, echo 2007 without change since 2001   Urticaria     Ventricular premature depolarization 12/16/2013   Wound dehiscence 05/08/2015               Past Surgical History:  Procedure Laterality Date   ABDOMINAL ADHESION SURGERY       ADENOIDECTOMY       BREAST SURGERY       carpel tunnel       CESAREAN SECTION       KNEE SURGERY       LEG SURGERY        bone graft   REPLACEMENT TOTAL KNEE   08/05/2022   TONSILLECTOMY       VEIN SURGERY        Right Leg          Social History  Social History         Tobacco Use   Smoking status: Former      Types: Cigarettes      Passive exposure: Past   Smokeless tobacco: Never  Vaping Use   Vaping status: Never Used  Substance Use Topics   Alcohol use: No      Alcohol/week: 0.0 standard drinks of alcohol   Drug use: No  Family History  Problem Relation Age of Onset   Congestive Heart Failure Mother     Hypertension Mother     Hyperlipidemia Sister      Dementia Sister     Alzheimer's disease Sister     Stroke Maternal Grandmother     Breast cancer Sister     Hyperlipidemia Sister     Allergic rhinitis Neg Hx     Angioedema Neg Hx     Asthma Neg Hx     Eczema Neg Hx     Immunodeficiency Neg Hx     Urticaria Neg Hx     Atopy Neg Hx            PE:       Vitals:    08/17/23 2133 08/17/23 2224 08/18/23 0246 08/18/23 1213  BP:   117/62 (!) 100/52 119/65  Pulse:   83 70 76  Resp:   20 18    Temp: 98.8 F (37.1 C) 100 F (37.8 C) 98.3 F (36.8 C) 98.1 F (36.7 C)  TempSrc: Oral Oral Oral Oral  SpO2:   95% 99% 100%  Weight:          Height:   5' (1.524 m)        Patient appears to be in no acute distress  patient is alert and oriented x3 Atraumatic normocephalic head No cervical or supraclavicular lymphadenopathy appreciated No increased work of breathing, no audible wheezes/rhonchi Regular sinus rhythm/rate Abdomen is soft, nontender, nondistended, significant left CVA tenderness Lower extremities are symmetric without appreciable edema Grossly neurologically intact No identifiable skin lesions   Recent Labs (last 2 labs)      Recent Labs    08/17/23 1612 08/18/23 0034  WBC 11.2* 9.6  HGB 13.1 11.6*  HCT 39.5 35.2*      Recent Labs (last 2 labs)      Recent Labs    08/17/23 1612 08/18/23 0034  NA 129* 127*  K 3.6 3.4*  CL 95* 97*  CO2 21* 21*  GLUCOSE 94 97  BUN 24* 24*  CREATININE 1.46* 1.43*  CALCIUM  9.5 8.6*      Recent Labs (last 2 labs)  No results for input(s): "LABPT", "INR" in the last 72 hours.   Recent Labs (last 2 labs)  No results for input(s): "LABURIN" in the last 72 hours.   >         Results for orders placed or performed during the hospital encounter of 08/17/23  Urine Culture     Status: Abnormal (Preliminary result)    Collection Time: 08/17/23  3:45 PM    Specimen: Urine, Clean Catch  Result Value Ref Range Status    Specimen Description     Final      URINE, CLEAN  CATCH Performed at [facility], [address] [place], [place] [address]      Special Requests     Final      NONE Performed at [facility], [address], [place], [place] [address]      Culture (A)   Final      >=100,000 COLONIES/mL GRAM NEGATIVE RODS IDENTIFICATION AND SUSCEPTIBILITIES TO FOLLOW Performed at [facility], [address] [place], [place] [address]      Report Status PENDING   Incomplete  Culture, blood (Routine X 2) w Reflex to ID Panel     Status: None (Preliminary result)    Collection Time: 08/18/23 12:39 PM  Specimen: BLOOD RIGHT ARM  Result Value Ref Range Status    Specimen Description     Final      BLOOD RIGHT ARM Performed at [facility], [address] [place], [place] [address]      Special Requests     Final      BOTTLES DRAWN AEROBIC AND ANAEROBIC Blood Culture adequate volume Performed at [facility], [address] [place], [place] [address]      Culture PENDING   Incomplete    Report Status PENDING   Incomplete        Imaging: I reviewed the patient's CT scan of the abdomen pelvis performed upon admission.  She has a delayed left nephrogram with hydroureteronephrosis down to the bladder.  In addition, there is some thickening at the bladder wall the area of the trigone.   Imp: Right-sided hydroureteronephrosis likely secondary to her recent TURBT.  She has a delayed nephrogram suggesting that right kidney is least partially obstructed.  When palpating her right CVA she has significant tenderness in that area.  I suspect that placing a stent is going to make her feel significantly better.   Recommendations: Plan to take the patient to the operating room tomorrow morning for stent placement.  I discussed this with her this evening and I have scheduled her for 7:30 AM stent placement.  She will be n.p.o. past midnight.  Please obtain consent.   Thank you for involving  me in this patient's care, I will continue to follow along.

## 2023-08-20 NOTE — Transfer of Care (Signed)
 Immediate Anesthesia Transfer of Care Note  Patient: Laurie Bowman  Procedure(s) Performed: CYSTOSCOPY, WITH RETROGRADE PYELOGRAM AND URETERAL STENT INSERTION (Right)  Patient Location: PACU  Anesthesia Type:MAC  Level of Consciousness: awake and alert   Airway & Oxygen Therapy: Patient Spontanous Breathing and Patient connected to face mask oxygen  Post-op Assessment: Report given to RN and Post -op Vital signs reviewed and stable  Post vital signs: Reviewed and stable  Last Vitals:  Vitals Value Taken Time  BP    Temp    Pulse 66 08/20/23 1725  Resp 21 08/20/23 1725  SpO2 100 % 08/20/23 1725  Vitals shown include unfiled device data.  Last Pain:  Vitals:   08/20/23 1349  TempSrc: Oral  PainSc:       Patients Stated Pain Goal: 0 (08/19/23 9528)  Complications: No notable events documented.

## 2023-08-20 NOTE — Progress Notes (Signed)
 PHARMACY - ANTICOAGULATION CONSULT NOTE  Pharmacy Consult for heparin  Indication: atrial fibrillation  Allergies  Allergen Reactions   Cefaclor Itching    Other reaction(s): Flushing (ALLERGY/intolerance)   Codeine Itching and Nausea And Vomiting   Fructose Swelling and Nausea And Vomiting    Swelling and GI upset, Fructose Granules  Fructose Granules   Sulfa Antibiotics Swelling   Meperidine Nausea And Vomiting and Itching   Elemental Sulfur Swelling   Propoxyphene Nausea And Vomiting    Patient Measurements: Height: 5' (152.4 cm) Weight: 62.6 kg (138 lb) IBW/kg (Calculated) : 45.5 HEPARIN  DW (KG): 58.6  Vital Signs: Temp: 101.2 F (38.4 C) (06/04 1349) Temp Source: Oral (06/04 1349) BP: 138/89 (06/04 1530) Pulse Rate: 65 (06/04 1730)  Labs: Recent Labs    08/17/23 2025 08/17/23 2303 08/18/23 0034 08/18/23 0034 08/19/23 0358 08/20/23 0415  HGB  --   --  11.6*   < > 11.0* 10.7*  HCT  --   --  35.2*  --  33.5* 32.5*  PLT  --   --  158  --  148* 153  CREATININE  --   --  1.43*  --  1.18* 1.18*  CKTOTAL  --  1,770*  --   --   --  333*  TROPONINIHS 147* 287* 329*  --   --   --    < > = values in this interval not displayed.    Estimated Creatinine Clearance: 30.9 mL/min (A) (by C-G formula based on SCr of 1.18 mg/dL (H)).   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   Hypokalemia 09/27/2020   Infection due to Staphylococcus epidermidis 09/27/2020   Irritable bowel syndrome 12/16/2013   LBBB (left bundle branch block) 04/30/2018   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   Major depressive disorder, recurrent, unspecified (HCC) 12/16/2013   Mitral regurgitation 04/30/2018   Moderate persistent asthma 09/28/2015   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 06/21/2015   Other chest pain 09/27/2020   Other specified  disorders of eyelid 11/09/2015   Pain in unspecified knee 12/16/2013   Peripheral visual field defect of both eyes 04/21/2015   PVC's (premature ventricular contractions) 04/30/2018   Rotator cuff tendonitis 12/16/2013   Undiagnosed cardiac murmurs 12/16/2013   Overview:  Mild MR/TR, echo 2007 without change since 2001   Urticaria    Ventricular premature depolarization 12/16/2013   Wound dehiscence 05/08/2015    Medications:  Medications Prior to Admission  Medication Sig Dispense Refill Last  Dose/Taking   acetaminophen  (TYLENOL ) 500 MG tablet Take 500 mg by mouth every 4 (four) hours as needed for moderate pain (pain score 4-6).   Unknown   albuterol  (PROVENTIL ) (2.5 MG/3ML) 0.083% nebulizer solution Take 3 mLs (2.5 mg total) by nebulization every 6 (six) hours as needed for wheezing or shortness of breath. 75 mL 0 Unknown   albuterol  (VENTOLIN  HFA) 108 (90 Base) MCG/ACT inhaler USE 1-2 PUFFS EVERY 4-6 HOURS AS NEEDED FOR COUGH OR WHEEZE. 8.5 each 1 Unknown   apixaban  (ELIQUIS ) 5 MG TABS tablet Take 1 tablet (5 mg total) by mouth 2 (two) times daily. 180 tablet 1 08/16/2023   augmented betamethasone dipropionate (DIPROLENE-AF) 0.05 % cream Apply 0.05 application  topically daily as needed (Lichen).   Unknown   benzonatate  (TESSALON ) 100 MG capsule Take 100 mg by mouth daily as needed for cough.   Past Month   buPROPion  (WELLBUTRIN  XL) 150 MG 24 hr tablet Take 150 mg by mouth daily.   Past Week   Cholecalciferol  (VITAMIN D) 50 MCG (2000 UT) CAPS Take 2,000 Units by mouth daily.   Past Week   clobetasol cream (TEMOVATE) 0.05 % Apply 1 application  topically daily as needed (Scratches).   Past Week   clotrimazole-betamethasone (LOTRISONE) cream Apply 1 application  topically daily as needed (Fugus).   Unknown   diclofenac Sodium (VOLTAREN) 1 % GEL Apply 2 g topically 4 (four) times daily as needed (Pain). Both knees   Past Month   dicyclomine  (BENTYL ) 20 MG tablet Take 20 mg by mouth daily as needed for spasms.   Past Week   fexofenadine (ALLEGRA) 180 MG tablet Take 180 mg by mouth daily.    Past Week   fluticasone  (FLOVENT  HFA) 110 MCG/ACT inhaler Use two puffs twice daily for one to two weeks during flares (Patient taking differently: Inhale 2 puffs into the lungs daily as needed (Asthma).) 1 each 3 Past Month   hydrochlorothiazide  (HYDRODIURIL ) 25 MG tablet Take 1 tablet (25 mg total) by mouth daily. 30 tablet 0 Past Week   lansoprazole  (PREVACID ) 30 MG capsule Take 1 capsule (30 mg  total) by mouth 2 (two) times daily before a meal. 30 capsule 5 Past Week   minoxidil (LONITEN) 2.5 MG tablet Take 1.25 mg by mouth daily.   Past Month   mometasone  (NASONEX ) 50 MCG/ACT nasal spray Place 2 sprays into the nose daily. (Patient taking differently: Place 2 sprays into the nose daily as needed (Rhinitis).) 34 each 2 Past Month   montelukast  (SINGULAIR ) 10 MG tablet Take 1 tablet (10 mg total) by mouth at bedtime. 90 tablet 1 Past Week   olopatadine  (PATANOL) 0.1 % ophthalmic solution Place 1 drop into both eyes 2 (two) times daily as needed. 5 mL 5 Past Week   Polyethylene Glycol 3350  (PEG 3350 ) POWD Take 17 g by mouth daily.    Past Week   potassium chloride  (KLOR-CON ) 10 MEQ tablet Take 10-20 mEq by mouth See admin instructions. Take 10 mEq (1  tablet) by mouth every morning and 20 mEq (2 tablets) every night.   Past Week   rosuvastatin  (CRESTOR ) 10 MG tablet Take 10 mg by mouth daily.   Past Week   tiZANidine (ZANAFLEX) 4 MG tablet Take 2 mg by mouth at bedtime as needed (Sleep).   Past Month   triamcinolone cream (KENALOG) 0.1 % Apply 1 application  topically 2 (two) times daily as needed (Eyes).   Past Month   verapamil  (CALAN -SR) 180 MG CR tablet Take 180 mg by mouth daily.   Past Week   oxybutynin (DITROPAN-XL) 10 MG 24 hr tablet Take 1 tablet by mouth daily. (Patient not taking: Reported on 08/18/2023)   Not Taking    Assessment: 83 yo F on apixaban  5 mg po BID PTA for Afib. Pharmacy consulted to manage heparin  drip for bridge therapy while apixaban  on hold pending nephrostomy tube placement.  Apixaban  5 mg po BID - last dose 6/4 @ 1018 am Hg 10.7 - low but stable PLT 153 WNL 6/4: OR for cystoscopy & failed ureteral stent placement.    Goal of Therapy:  Heparin  level 0.3-0.7 units/ml aPTT 66-102 seconds Monitor platelets by anticoagulation protocol: Yes   Plan: Baseline heparin  level & aPTT ordered to be drawn now - expect elevated heparin  level Start heparin  drip with  no bolus at 2200 tonight (12 hours after last apixaban  dose) at rate of 900 units/hr Check aPTT 8 hrs after heparin  started Daily CBC, heparin  level, aPTT Will monitor using aPTTs for now due to apixaban  in system F/u for timing of nephrostomy tube placement  Rubie Corona, Pharm.D Use secure chat for questions 08/20/2023 5:53 PM

## 2023-08-20 NOTE — Progress Notes (Signed)
NPO at midnight

## 2023-08-20 NOTE — Progress Notes (Signed)
 PT instructed on the importance of heparin  drip . Pt refused gtt and getting blood redrawn. Attending provider notified. SCD's on. Pt currently in NSR.

## 2023-08-21 ENCOUNTER — Encounter (HOSPITAL_COMMUNITY): Payer: Self-pay | Admitting: Urology

## 2023-08-21 ENCOUNTER — Inpatient Hospital Stay (HOSPITAL_COMMUNITY)

## 2023-08-21 DIAGNOSIS — N179 Acute kidney failure, unspecified: Secondary | ICD-10-CM | POA: Diagnosis not present

## 2023-08-21 DIAGNOSIS — I480 Paroxysmal atrial fibrillation: Secondary | ICD-10-CM | POA: Diagnosis not present

## 2023-08-21 LAB — CBC
HCT: 30.6 % — ABNORMAL LOW (ref 36.0–46.0)
Hemoglobin: 10 g/dL — ABNORMAL LOW (ref 12.0–15.0)
MCH: 31.3 pg (ref 26.0–34.0)
MCHC: 32.7 g/dL (ref 30.0–36.0)
MCV: 95.9 fL (ref 80.0–100.0)
Platelets: 162 10*3/uL (ref 150–400)
RBC: 3.19 MIL/uL — ABNORMAL LOW (ref 3.87–5.11)
RDW: 13.5 % (ref 11.5–15.5)
WBC: 8.1 10*3/uL (ref 4.0–10.5)
nRBC: 0 % (ref 0.0–0.2)

## 2023-08-21 LAB — BASIC METABOLIC PANEL WITH GFR
Anion gap: 8 (ref 5–15)
BUN: 15 mg/dL (ref 8–23)
CO2: 22 mmol/L (ref 22–32)
Calcium: 8.2 mg/dL — ABNORMAL LOW (ref 8.9–10.3)
Chloride: 101 mmol/L (ref 98–111)
Creatinine, Ser: 0.99 mg/dL (ref 0.44–1.00)
GFR, Estimated: 57 mL/min — ABNORMAL LOW (ref >60)
Glucose, Bld: 85 mg/dL (ref 70–99)
Potassium: 3 mmol/L — ABNORMAL LOW (ref 3.5–5.1)
Sodium: 131 mmol/L — ABNORMAL LOW (ref 135–145)

## 2023-08-21 LAB — MAGNESIUM: Magnesium: 1.8 mg/dL (ref 1.7–2.4)

## 2023-08-21 LAB — PROTIME-INR
INR: 1.5 — ABNORMAL HIGH (ref 0.8–1.2)
Prothrombin Time: 18.6 s — ABNORMAL HIGH (ref 11.4–15.2)

## 2023-08-21 LAB — PHOSPHORUS: Phosphorus: 2.6 mg/dL (ref 2.5–4.6)

## 2023-08-21 MED ORDER — LEVOFLOXACIN IN D5W 500 MG/100ML IV SOLN
500.0000 mg | Freq: Once | INTRAVENOUS | Status: AC
  Administered 2023-08-21: 500 mg via INTRAVENOUS
  Filled 2023-08-21: qty 100

## 2023-08-21 MED ORDER — ENOXAPARIN SODIUM 60 MG/0.6ML IJ SOSY
60.0000 mg | PREFILLED_SYRINGE | Freq: Two times a day (BID) | INTRAMUSCULAR | Status: DC
  Administered 2023-08-21 – 2023-08-25 (×8): 60 mg via SUBCUTANEOUS
  Filled 2023-08-21 (×8): qty 0.6

## 2023-08-21 MED ORDER — MIDAZOLAM HCL 2 MG/2ML IJ SOLN
INTRAMUSCULAR | Status: AC
  Filled 2023-08-21: qty 2

## 2023-08-21 MED ORDER — IOHEXOL 300 MG/ML  SOLN
50.0000 mL | Freq: Once | INTRAMUSCULAR | Status: AC | PRN
  Administered 2023-08-21: 50 mL

## 2023-08-21 MED ORDER — FENTANYL CITRATE (PF) 100 MCG/2ML IJ SOLN
INTRAMUSCULAR | Status: AC
  Filled 2023-08-21: qty 2

## 2023-08-21 MED ORDER — FENTANYL CITRATE (PF) 100 MCG/2ML IJ SOLN
INTRAMUSCULAR | Status: AC | PRN
  Administered 2023-08-21 (×4): 50 ug via INTRAVENOUS

## 2023-08-21 MED ORDER — POTASSIUM CHLORIDE 10 MEQ/100ML IV SOLN
10.0000 meq | INTRAVENOUS | Status: DC
  Filled 2023-08-21 (×2): qty 100

## 2023-08-21 MED ORDER — POTASSIUM CHLORIDE CRYS ER 20 MEQ PO TBCR
40.0000 meq | EXTENDED_RELEASE_TABLET | Freq: Once | ORAL | Status: AC
  Administered 2023-08-21: 40 meq via ORAL
  Filled 2023-08-21: qty 2

## 2023-08-21 MED ORDER — IOHEXOL 300 MG/ML  SOLN
50.0000 mL | Freq: Once | INTRAMUSCULAR | Status: AC | PRN
  Administered 2023-08-21: 20 mL via INTRAVENOUS

## 2023-08-21 MED ORDER — LOPERAMIDE HCL 2 MG PO CAPS
2.0000 mg | ORAL_CAPSULE | ORAL | Status: DC | PRN
  Administered 2023-08-24 – 2023-08-26 (×4): 2 mg via ORAL
  Filled 2023-08-21 (×4): qty 1

## 2023-08-21 MED ORDER — MIDAZOLAM HCL 2 MG/2ML IJ SOLN
INTRAMUSCULAR | Status: AC | PRN
  Administered 2023-08-21 (×4): 1 mg via INTRAVENOUS

## 2023-08-21 MED ORDER — LIDOCAINE-EPINEPHRINE 1 %-1:100000 IJ SOLN
20.0000 mL | Freq: Once | INTRAMUSCULAR | Status: AC
  Administered 2023-08-21: 20 mL via INTRADERMAL

## 2023-08-21 MED ORDER — LIDOCAINE-EPINEPHRINE 1 %-1:100000 IJ SOLN
INTRAMUSCULAR | Status: AC
  Filled 2023-08-21: qty 1

## 2023-08-21 NOTE — Consult Note (Signed)
 Chief Complaint: Pelvic/flank discomfort, bladder cancer, obstructed right kidney; referred for right percutaneous nephrostomy/nephroureteral catheter placement  Referring Provider(s): Herrick,B  Supervising Physician: Art Largo  Patient Status: [facility] - In-pt  History of Present Illness: Laurie Bowman is an 83 y.o. female ex smoker  with PMH sig for asthma,HTN, fatty liver, GERD, HLD, hypertrophic cardiomyopathy, pafib on eliquis , IBS, LBBB and nonmuscle invasive low grade bladder cancer with prior intravesical BCG, TURBT overlying rt ureteral orifice. She recently presented with abd/pelvic pain, weakness, rt CVA tenderness, dysuria/UTI,AKI. CT A/P on 6/1 revealed:   1. Trace nondependent gas in the bladder and subtle perivesical stranding, correlate for cystitis. 2. Mild diminished/delayed enhancement of the right kidney with associated mild right hydroureteronephrosis. Differential considerations include recent passage of a ureteral calculus or ascending infection.   She is s/p cystoscopy and failed rt ureteral stent placement by urology on 6/4. The right ureteral orifice could not be visualized. Request now received from urology for right PCN vs NU cath placement. Pt afebrile. Current labs include WBC 8.1, HGB 10, PLTS 162K, CREAT 0.99, K 3, PT/INR pend; urine cx with e coli     Patient is Full Code  Past Medical History:  Diagnosis Date   Abnormal mammogram 08/16/2013   Acute sinusitis 06/10/2016   Allergic rhinitis 09/28/2015   Alopecia 12/16/2013   Aortic atherosclerosis (HCC) 09/27/2020   Asthma    Asthma with acute exacerbation 06/10/2016   Benign essential hypertension 07/08/2011   Benign neoplasm of colon 12/16/2013   Cancer (HCC)    Carpal tunnel syndrome 12/16/2013   Constipation 12/16/2013   Cough 10/15/2016   Cough 10/15/2016   CRF (chronic renal failure), 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   Hypokalemia 09/27/2020   Infection due to Staphylococcus epidermidis 09/27/2020   Irritable bowel syndrome 12/16/2013   LBBB (left bundle branch block) 04/30/2018   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   Major depressive disorder, recurrent, unspecified (HCC) 12/16/2013   Mitral regurgitation 04/30/2018   Moderate persistent asthma 09/28/2015   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 06/21/2015   Other chest pain 09/27/2020   Other specified disorders of eyelid 11/09/2015   Pain in unspecified knee 12/16/2013   Peripheral visual field defect of both eyes 04/21/2015   PVC's (premature ventricular contractions) 04/30/2018   Rotator cuff tendonitis 12/16/2013   Undiagnosed cardiac murmurs 12/16/2013   Overview:  Mild MR/TR, echo 2007 without change since 2001   Urticaria    Ventricular premature depolarization 12/16/2013  Wound dehiscence 05/08/2015    Past Surgical History:  Procedure Laterality  Date   ABDOMINAL ADHESION SURGERY     ADENOIDECTOMY     BREAST SURGERY     carpel tunnel     CESAREAN SECTION     CYSTOSCOPY W/ URETERAL STENT PLACEMENT Right 08/20/2023   Procedure: CYSTOSCOPY, WITH RETROGRADE PYELOGRAM AND URETERAL STENT INSERTION;  Surgeon: Andrez Banker, MD;  Location: [facility];  Service: Urology;  Laterality: Right;   KNEE SURGERY     LEG SURGERY     bone graft   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/2022   TONSILLECTOMY     VEIN SURGERY     Right Leg    Allergies: Cefaclor, Codeine, Fructose, Sulfa antibiotics, Meperidine, Elemental sulfur, and Propoxyphene  Medications: Prior to Admission medications   Medication Sig Start Date End Date Taking? Authorizing Provider  acetaminophen  (TYLENOL ) 500 MG tablet Take 500 mg by mouth every 4 (four) hours as needed for moderate pain (pain score 4-6). 09/18/20  Yes [provider]  albuterol  (PROVENTIL ) (2.5 MG/3ML) 0.083% nebulizer solution Take 3 mLs (2.5 mg total) by nebulization every 6 (six) hours as needed for wheezing or shortness of breath. 06/18/21  Yes Rochester Chuck, MD  albuterol  (VENTOLIN  HFA) [phone] Base) MCG/ACT inhaler USE 1-2 PUFFS EVERY 4-6 HOURS AS NEEDED FOR COUGH OR WHEEZE. 07/02/22  Yes Rochester Chuck, MD  apixaban  (ELIQUIS ) 5 MG TABS tablet Take 1 tablet (5 mg total) by mouth 2 (two) times daily. 06/19/23  Yes Tammie Fall, MD  augmented betamethasone dipropionate (DIPROLENE-AF) 0.05 % cream Apply 0.05 application  topically daily as needed (Lichen).   Yes [provider]  benzonatate  (TESSALON ) 100 MG capsule Take 100 mg by mouth daily as needed for cough. 07/02/18  Yes [provider]  buPROPion  (WELLBUTRIN  XL) 150 MG 24 hr tablet Take 150 mg by mouth daily. 12/02/21  Yes [provider]  Cholecalciferol  (VITAMIN D) 50 MCG (2000 UT) CAPS Take 2,000 Units by mouth daily.   Yes [provider]  clobetasol cream (TEMOVATE) 0.05 % Apply 1 application   topically daily as needed (Scratches). 03/22/18  Yes [provider]  clotrimazole-betamethasone (LOTRISONE) cream Apply 1 application  topically daily as needed (Fugus). 03/16/19  Yes [provider]  diclofenac Sodium (VOLTAREN) 1 % GEL Apply 2 g topically 4 (four) times daily as needed (Pain). Both knees 04/16/19  Yes [provider]  dicyclomine  (BENTYL ) 20 MG tablet Take 20 mg by mouth daily as needed for spasms. 04/14/18  Yes [provider]  fexofenadine (ALLEGRA) 180 MG tablet Take 180 mg by mouth daily.    Yes [provider]  fluticasone  (FLOVENT  HFA) 110 MCG/ACT inhaler Use two puffs twice daily for one to two weeks during flares Patient taking differently: Inhale 2 puffs into the lungs daily as needed (Asthma). 06/26/23  Yes Rochester Chuck, MD  hydrochlorothiazide  (HYDRODIURIL ) 25 MG tablet Take 1 tablet (25 mg total) by mouth daily. 09/04/21 08/18/23 Yes Arrien, Curlee Doss, MD  lansoprazole  (PREVACID ) 30 MG capsule Take 1 capsule (30 mg total) by mouth 2 (two) times daily before a meal. 07/10/21  Yes Rochester Chuck, MD  minoxidil (LONITEN) 2.5 MG tablet Take 1.25 mg by mouth daily. 07/16/23  Yes [provider]  mometasone  (NASONEX ) 50 MCG/ACT nasal spray Place 2 sprays into the nose daily. Patient taking differently: Place 2 sprays into the nose daily as needed (Rhinitis). 06/26/23  Yes Idolina Maker,  Mirna Amis, MD  montelukast  (SINGULAIR ) 10 MG tablet Take 1 tablet (10 mg total) by mouth at bedtime. 01/09/23  Yes Rochester Chuck, MD  olopatadine  (PATANOL) 0.1 % ophthalmic solution Place 1 drop into both eyes 2 (two) times daily as needed. 04/18/20  Yes Rochester Chuck, MD  Polyethylene Glycol 3350  (PEG 3350 ) POWD Take 17 g by mouth daily.  06/24/16  Yes [provider]  potassium chloride  (KLOR-CON ) 10 MEQ tablet Take 10-20 mEq by mouth See admin instructions. Take 10 mEq (1 tablet) by mouth every morning and 20  mEq (2 tablets) every night. 04/21/15  Yes [provider]  rosuvastatin  (CRESTOR ) 10 MG tablet Take 10 mg by mouth daily. 07/13/21  Yes [provider]  tiZANidine (ZANAFLEX) 4 MG tablet Take 2 mg by mouth at bedtime as needed (Sleep). 11/16/19  Yes [provider]  triamcinolone cream (KENALOG) 0.1 % Apply 1 application  topically 2 (two) times daily as needed (Eyes). 12/14/18  Yes [provider]  verapamil  (CALAN -SR) 180 MG CR tablet Take 180 mg by mouth daily. 08/06/23  Yes [provider]  oxybutynin (DITROPAN-XL) 10 MG 24 hr tablet Take 1 tablet by mouth daily. Patient not taking: Reported on 08/18/2023 06/30/23 09/28/23  [provider]     Family History  Problem Relation Age of Onset   Congestive Heart Failure Mother    Hypertension Mother    Hyperlipidemia Sister    Dementia Sister    Alzheimer's disease Sister    Stroke Maternal Grandmother    Breast cancer Sister    Hyperlipidemia Sister    Allergic rhinitis Neg Hx    Angioedema Neg Hx    Asthma Neg Hx    Eczema Neg Hx    Immunodeficiency Neg Hx    Urticaria Neg Hx    Atopy Neg Hx     Social History   Socioeconomic History   Marital status: Married    Spouse name: Not on file   Number of children: Not on file   Years of education: Not on file   Highest education level: Not on file  Occupational History   Not on file  Tobacco Use   Smoking status: Former    Types: Cigarettes    Passive exposure: Past   Smokeless tobacco: Never  Vaping Use   Vaping status: Never Used  Substance and Sexual Activity   Alcohol use: No    Alcohol/week: 0.0 standard drinks of alcohol   Drug use: No   Sexual activity: Never  Other Topics Concern   Not on file  Social History Narrative   Not on file   Social Drivers of Health   Financial Resource Strain: Not on file  Food Insecurity: No Food Insecurity (08/17/2023)   Hunger Vital Sign    Worried About Running Out of Food in the  Last Year: Never true    Ran Out of Food in the Last Year: Never true  Transportation Needs: No Transportation Needs (08/17/2023)   PRAPARE - Administrator, Civil Service (Medical): No    Lack of Transportation (Non-Medical): No  Physical Activity: Not on file  Stress: Not on file  Social Connections: Unknown (08/18/2023)   Social Connection and Isolation Panel [NHANES]    Frequency of Communication with Friends and Family: Patient declined    Frequency of Social Gatherings with Friends and Family: Patient declined    Attends Religious Services: Patient declined    Active Member of [employer]  or Organizations: Patient declined    Attends Banker Meetings: Patient declined    Marital Status: Married       Review of Systems see above; denies fever,HA,CP,worsening dyspnea, cough, N/V or bleeding  Vital Signs: BP (!) 143/62 (BP Location: Left Leg)   Pulse 65   Temp 98.1 F (36.7 C) (Oral)   Resp 16   Ht 5' (1.524 m)   Wt 138 lb (62.6 kg)   LMP  (LMP Unknown)   SpO2 96%   BMI 26.95 kg/m   Advance Care Plan: no documents on file.  Physical Exam: awake/alert; chest- sl dim BS bases; heart- RRR; abd-soft,+BS, mildly tender pelvic region , some rt CVA tenderness; no sig LE edema  Imaging: DG C-Arm 1-60 Min-No Report Result Date: 08/20/2023 Fluoroscopy was utilized by the requesting physician.  No radiographic interpretation.   ECHOCARDIOGRAM COMPLETE Result Date: 08/18/2023    ECHOCARDIOGRAM REPORT   Patient Name:   Kaleah Murphey Date of Exam: 08/18/2023 Medical Rec #:  [id number]    Height:       60.0 in Accession #:    [id number]   Weight:       138.0 lb Date of Birth:  30-Jan-1941    BSA:          1.594 m Patient Age:    83 years     BP:           100/52 mmHg Patient Gender: F            HR:           75 bpm. Exam Location:  Inpatient Procedure: 2D Echo, Cardiac Doppler and Color Doppler (Both Spectral and Color            Flow Doppler were utilized during procedure).  Indications:    Elevated Troponin  History:        Patient has prior history of Echocardiogram examinations.                 Arrythmias:Atrial Fibrillation; Risk Factors:Hypertension and                 Dyslipidemia.  Sonographer:    Meagan Baucom RDCS, FE, PE Referring Phys: [id number] ARSHAD N KAKRAKANDY IMPRESSIONS  1. LV outflow tract peak gradient at rest 138 mmHg. Left ventricular ejection fraction, by estimation, is 50 to 55%. The left ventricle has low normal function. The left ventricle has no regional wall motion abnormalities but there is septal-lateral dyssynchrony consistent with LBBB. There is moderate asymmetric left ventricular hypertrophy of the septal segment. Left ventricular diastolic parameters are consistent with Grade I diastolic dysfunction (impaired relaxation).  2. Right ventricular systolic function is normal. The right ventricular size is normal. There is moderately elevated pulmonary artery systolic pressure. The estimated right ventricular systolic pressure is 55.6 mmHg.  3. Left atrial size was moderately dilated.  4. Mitral valve systolic anterior motion is present. The mitral valve is abnormal. Moderate eccentric mitral valve regurgitation. No evidence of mitral stenosis.  5. The aortic valve is tricuspid. There is mild calcification of the aortic valve. Aortic valve regurgitation is not visualized. No aortic stenosis is present.  6. The inferior vena cava is normal in size with <50% respiratory variability, suggesting right atrial pressure of 8 mmHg.  7. 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 FINDINGS  Left Ventricle: LV outflow tract peak gradient at rest 138 mmHg. Left ventricular ejection fraction, by estimation, is 50 to 55%. The left ventricle  has low normal function. The left ventricle has no regional wall motion abnormalities. The left ventricular internal cavity size was normal in size. There is moderate asymmetric left ventricular hypertrophy of the septal segment. Left ventricular  diastolic parameters are consistent with Grade I diastolic dysfunction (impaired relaxation). Right Ventricle: The right ventricular size is normal. No increase in right ventricular wall thickness. Right ventricular systolic function is normal. There is moderately elevated pulmonary artery systolic pressure. The tricuspid regurgitant velocity is 3.45 m/s, and with an assumed right atrial pressure of 8 mmHg, the estimated right ventricular systolic pressure is 55.6 mmHg. Left Atrium: Left atrial size was moderately dilated. Right Atrium: Right atrial size was normal in size. Pericardium: There is no evidence of pericardial effusion. Mitral Valve: Mitral valve systolic anterior motion is present. The mitral valve is abnormal. Moderate mitral valve regurgitation. No evidence of mitral valve stenosis. Tricuspid Valve: The tricuspid valve is normal in structure. Tricuspid valve regurgitation is mild. Aortic Valve: The aortic valve is tricuspid. There is mild calcification of the aortic valve. Aortic valve regurgitation is not visualized. No aortic stenosis is present. Pulmonic Valve: The pulmonic valve was normal in structure. Pulmonic valve regurgitation is trivial. Aorta: The aortic root is normal in size and structure. Venous: The inferior vena cava is normal in size with less than 50% respiratory variability, suggesting right atrial pressure of 8 mmHg. IAS/Shunts: No atrial level shunt detected by color flow Doppler.  LEFT VENTRICLE PLAX 2D LVIDd:         3.50 cm   Diastology LVIDs:         2.70 cm   LV e' medial:    4.68 cm/s LV PW:         1.10 cm   LV E/e' medial:  23.5 LV IVS:        1.50 cm   LV e' lateral:   6.74 cm/s LVOT diam:     1.90 cm   LV E/e' lateral: 16.3 LVOT Area:     2.84 cm  RIGHT VENTRICLE RV Basal diam:  2.60 cm RV Mid diam:    2.00 cm RV S prime:     17.00 cm/s TAPSE (M-mode): 1.3 cm LEFT ATRIUM             Index        RIGHT ATRIUM           Index LA Vol (A2C):   59.5 ml 37.32 ml/m  RA Area:      12.70 cm LA Vol (A4C):   69.2 ml 43.41 ml/m  RA Volume:   28.00 ml  17.56 ml/m LA Biplane Vol: 65.1 ml 40.83 ml/m   AORTA Ao Root diam: 2.60 cm Ao Asc diam:  2.70 cm MITRAL VALVE                TRICUSPID VALVE MV Area (PHT): 3.48 cm     TR Peak grad:   47.6 mmHg MV Decel Time: 218 msec     TR Vmax:        345.00 cm/s MV E velocity: 110.00 cm/s MV A velocity: 118.00 cm/s  SHUNTS MV E/A ratio:  0.93         Systemic Diam: 1.90 cm Dalton McleanMD Electronically signed by Archer Bear Signature Date/Time: 08/18/2023/2:32:04 PM    Final    CT ABDOMEN PELVIS W CONTRAST Result Date: 08/17/2023 EXAM: CT ABDOMEN AND PELVIS WITH CONTRAST 08/17/2023 08:49:42 PM TECHNIQUE: CT of the abdomen and pelvis  was performed with the administration of intravenous contrast. Multiplanar reformatted images are provided for review. Automated exposure control, iterative reconstruction, and/or weight based adjustment of the mA/kV was utilized to reduce the radiation dose to as low as reasonably achievable. COMPARISON: 03/02/2023 CLINICAL HISTORY: Abdominal pain, acute, nonlocalized. Table formatting from the original note was not included. Images from the original note were not included. Pt. BIBEMS from home w/ c/o a fall. No LOC. Pt is on eliquis  BID. Pt. C/o pain and tenderness to her left elbow. Pt. States she had first txt for bladder cancer last Tuesday and has been feeling weak and has had chills since. Per EMS stroke screen; negative. FINDINGS: LOWER CHEST: Mild subpleural reticulation/fibrosis at the lung bases. LIVER: The liver is unremarkable. GALLBLADDER AND BILE DUCTS: Gallbladder is unremarkable. No biliary ductal dilatation. SPLEEN: No acute abnormality. PANCREAS: No acute abnormality. ADRENAL GLANDS: No acute abnormality. KIDNEYS, URETERS AND BLADDER: Simple left upper pole renal cyst measuring up to 18 mm, benign. No follow-up is recommended. Mild diminished/delayed enhancement of the right kidney with associated  mild right hydroureteronephrosis, although without a visualized ureteral or bladder calculus. Differential consideration to include recent passage of a ureteral calculus or ascending infection. Trace nondependent gas in the bladder with subtle perivesical stranding, correlate for cystitis. GI AND BOWEL: The appendix is not discretely visualized. PERITONEUM AND RETROPERITONEUM: No ascites. No free air. VASCULATURE: Atherosclerotic calcifications of the abdominal aorta and branch vessels. LYMPH NODES: No lymphadenopathy. REPRODUCTIVE ORGANS: No acute abnormality. BONES AND SOFT TISSUES: No acute osseous abnormality. No focal soft tissue abnormality. IMPRESSION: 1. Trace nondependent gas in the bladder and subtle perivesical stranding, correlate for cystitis. 2. Mild diminished/delayed enhancement of the right kidney with associated mild right hydroureteronephrosis. Differential considerations include recent passage of a ureteral calculus or ascending infection. Electronically signed by: Zadie Herter MD 08/17/2023 08:57 PM EDT RP Workstation: [id number]   DG Chest Port 1 View Result Date: 08/17/2023 CLINICAL DATA:  Pain. Fall. History of recent treatment for bladder cancer. Weakness and chills. EXAM: PORTABLE CHEST 1 VIEW COMPARISON:  07/24/2022 FINDINGS: Cardiac enlargement with mild pulmonary vascular congestion. Perihilar and basilar infiltrates are new since prior study, likely edema. Pneumonia would also be a possibility. No pleural effusion or pneumothorax. Mediastinal contours appear intact. Calcification of the aorta. IMPRESSION: Cardiac enlargement with pulmonary vascular congestion and bilateral pulmonary infiltrates. Findings are new since prior study. Electronically Signed   By: Boyce Byes M.D.   On: 08/17/2023 19:41   DG Elbow Complete Left Result Date: 08/17/2023 CLINICAL DATA:  Pain after a fall. Left posterior elbow pain after a fall today. EXAM: LEFT ELBOW - COMPLETE 3+ VIEW COMPARISON:   Left forearm 08/27/2021 FINDINGS: Degenerative changes in the left elbow. No evidence of acute fracture or dislocation. No focal bone lesion or bone destruction. Bone cortex appears intact. No significant effusions. Soft tissue defect over the olecranon region consistent with laceration. No radiopaque soft tissue foreign bodies. Soft tissue calcification projected over the forearm, likely dystrophic. IMPRESSION: No acute bony abnormalities. Soft tissue defect over the olecranon region consistent with laceration. Electronically Signed   By: Boyce Byes M.D.   On: 08/17/2023 17:29    Labs:  CBC: Recent Labs    08/18/23 0034 08/19/23 0358 08/20/23 0415 08/21/23 0420  WBC 9.6 5.4 8.6 8.1  HGB 11.6* 11.0* 10.7* 10.0*  HCT 35.2* 33.5* 32.5* 30.6*  PLT 158 148* 153 162    COAGS: Recent Labs    08/20/23 1808  APTT  41*    BMP: Recent Labs    08/18/23 0034 08/19/23 0358 08/20/23 0415 08/21/23 0420  NA 127* 130* 130* 131*  K 3.4* 3.3* 3.2* 3.0*  CL 97* 100 100 101  CO2 21* 22 21* 22  GLUCOSE 97 97 110* 85  BUN 24* 23 17 15   CALCIUM  8.6* 8.7* 8.2* 8.2*  CREATININE 1.43* 1.18* 1.18* 0.99  GFRNONAA 37* 46* 46* 57*    LIVER FUNCTION TESTS: Recent Labs    08/17/23 1612 08/18/23 0034 08/19/23 0358 08/20/23 0415  BILITOT 2.5* 1.8* 1.6* 1.5*  AST 36 60* 78* 71*  ALT 23 26 38 46*  ALKPHOS 55 47 44 52  PROT 7.0 5.9* 5.5* 5.4*  ALBUMIN 3.6 2.9* 2.7* 2.5*    TUMOR MARKERS: No results for input(s): "AFPTM", "CEA", "CA199", "CHROMGRNA" in the last 8760 hours.  Assessment and Plan: 83 y.o. female ex smoker  with PMH sig for asthma,HTN, fatty liver, GERD, HLD, hypertrophic cardiomyopathy, pafib on eliquis , IBS, LBBB and nonmuscle invasive low grade bladder cancer with prior intravesical BCG, TURBT overlying rt ureteral orifice. She recently presented with abd/pelvic pain, weakness, rt CVA tenderness, dysuria/UTI,AKI. CT A/P on 6/1 revealed:   1. Trace nondependent gas in the  bladder and subtle perivesical stranding, correlate for cystitis. 2. Mild diminished/delayed enhancement of the right kidney with associated mild right hydroureteronephrosis. Differential considerations include recent passage of a ureteral calculus or ascending infection.   She is s/p cystoscopy and failed rt ureteral stent placement by urology on 6/4. The right ureteral orifice could not be visualized. Request now received from urology for right PCN vs NU cath placement. Pt afebrile. Current labs include WBC 8.1, HGB 10, PLTS 162K, CREAT 0.99, K 3, PT/INR pend; urine cx with e coli. Imaging studies have been reviewed by Dr. Darylene Epley. Risks and benefits of right PCN placement was discussed with the patient including, but not limited to, infection, bleeding, significant bleeding causing loss or decrease in renal function or damage to adjacent structures.   All of the patient's questions were answered, patient is agreeable to proceed.  Consent signed and in chart.  Procedure tent planned for later today; last eliquis  yesterday morning; also previously on IV heparin - pt currently refusing    Thank you for allowing our service to participate in Kearston Putman 's care.  Electronically Signed: D. Honore Lux, PA-C   08/21/2023, 9:53 AM      I spent a total of 40 minutes    in face to face in clinical consultation, greater than 50% of which was counseling/coordinating care for right percutaneous nephrostomy/nephroureteral catheter placement

## 2023-08-21 NOTE — Procedures (Addendum)
 Vascular and Interventional Radiology Procedure Note  Patient: Laurie Bowman DOB: 24-Aug-1940 Medical Record Number: [id number] Note Date/Time: 08/21/23 2:31 PM   Performing Physician: Art Largo, MD Assistant(s): None  Diagnosis: Hydronephrosis. Failed retrograde stenting  Procedure:  RIGHT NEPHROURETERAL TUBE PLACEMENT RIGHT ANTEROGRADE NEPHROSTOGRAM  Anesthesia: Conscious Sedation Complications: None Estimated Blood Loss: Minimal Specimens:  None  Findings:  Successful placement of a 10 F, 22 cm nephroureteral tube into the right kidney(s), with the tip of the catheter in the urinary bladder.  Plan: Follow up for routine evaluation and ureteral stent placement in 6-8 week(s).   See detailed procedure note with images in PACS. The patient tolerated the procedure well without incident or complication and was returned to Recovery in stable condition.    Art Largo, MD [facility]   Pager. [phone] Clinic. [phone]

## 2023-08-21 NOTE — Progress Notes (Signed)
 Urology Inpatient Progress Report  Abdominal pain, generalized [R10.84] ARF (acute renal failure) (HCC) [N17.9] Acute UTI [N39.0] Failure to thrive in adult [R62.7] Atrial fibrillation with rapid ventricular response (HCC) [I48.91] Generalized weakness [R53.1] Malignant neoplasm of urinary bladder, unspecified site (HCC) [C67.9] Procedure(s): CYSTOSCOPY, WITH RETROGRADE PYELOGRAM AND URETERAL STENT INSERTION 1 Day Post-Op  Intv/Subj: Unable to place stent - could not locate the right ureteral orifice No acute events overnight. Patient is without complaint, feels weak.   Urine straw colored  Principal Problem:   ARF (acute renal failure) (HCC) Active Problems:   Allergic rhinitis   Hypertrophic cardiomyopathy (HCC)   Hypertension   Rhabdomyolysis   Atrial fibrillation with RVR (HCC)   Elevated troponin   Acute UTI   Hyponatremia   Generalized weakness  Current Facility-Administered Medications  Medication Dose Route Frequency Provider Last Rate Last Admin   acetaminophen  (TYLENOL ) tablet 650 mg  650 mg Oral Q6H PRN Etter Hermann., MD   650 mg at 08/20/23 0245   [START ON 08/22/2023] amiodarone  (PACERONE ) tablet 200 mg  200 mg Oral BID Tobb, Kardie, DO       [START ON 08/29/2023] amiodarone  (PACERONE ) tablet 200 mg  200 mg Oral Daily Tobb, Kardie, DO       amiodarone  (PACERONE ) tablet 400 mg  400 mg Oral BID Tobb, Kardie, DO   400 mg at 08/20/23 2230   amoxicillin-clavulanate (AUGMENTIN) 875-125 MG per tablet 1 tablet  1 tablet Oral Q12H Etter Hermann., MD   1 tablet at 08/20/23 2230   buPROPion  (WELLBUTRIN  XL) 24 hr tablet 150 mg  150 mg Oral Daily Angelene Kelly, MD   150 mg at 08/20/23 1019   heparin  ADULT infusion 100 units/mL (25000 units/250mL)  900 Units/hr Intravenous Continuous Arlyne Bering T, RPH       magnesium  sulfate IVPB 2 g 50 mL  2 g Intravenous Once Kc, Ramesh, MD       mupirocin ointment (BACTROBAN) 2 % 1 Application  1 Application  Nasal BID Etter Hermann., MD   1 Application at 08/20/23 2235   pantoprazole  (PROTONIX ) EC tablet 40 mg  40 mg Oral Daily Etter Hermann., MD   40 mg at 08/20/23 1018   phosphorus (K PHOS  NEUTRAL) tablet 500 mg  500 mg Oral BID Kc, Lurlene Salon, MD   500 mg at 08/20/23 2230   verapamil  (CALAN -SR) CR tablet 120 mg  120 mg Oral Daily Angelene Kelly, MD   120 mg at 08/20/23 1019     Objective: Vital: Vitals:   08/20/23 1800 08/20/23 1815 08/20/23 1906 08/20/23 2222  BP:   (!) 155/77 (!) 143/62  Pulse: 63 66 60 65  Resp: (!) 21 18 20 16   Temp:   99.1 F (37.3 C) 98.1 F (36.7 C)  TempSrc:   Oral Oral  SpO2: 92% 93% 97% 96%  Weight:      Height:       I/Os: I/O last 3 completed shifts: In: 600 [I.V.:500; IV Piggyback:100] Out: 1050 [Urine:1050]  Physical Exam:  General: Patient is in no apparent distress Lungs: Normal respiratory effort, chest expands symmetrically. GI: abdomen is soft, right CVA tenderness Ext: lower extremities symmetric  Lab Results: Recent Labs    08/19/23 0358 08/20/23 0415 08/21/23 0420  WBC 5.4 8.6 8.1  HGB 11.0* 10.7* 10.0*  HCT 33.5* 32.5* 30.6*   Recent Labs    08/19/23 0358 08/20/23 0415 08/21/23 0420  NA 130*  130* 131*  K 3.3* 3.2* 3.0*  CL 100 100 101  CO2 22 21* 22  GLUCOSE 97 110* 85  BUN 23 17 15   CREATININE 1.18* 1.18* 0.99  CALCIUM  8.7* 8.2* 8.2*   No results for input(s): "LABPT", "INR" in the last 72 hours. No results for input(s): "LABURIN" in the last 72 hours. Results for orders placed or performed during the hospital encounter of 08/17/23  Urine Culture     Status: Abnormal   Collection Time: 08/17/23  3:45 PM   Specimen: Urine, Clean Catch  Result Value Ref Range Status   Specimen Description   Final    URINE, CLEAN CATCH Performed at [facility], [address] [place], [place] [address]    Special Requests   Final    NONE Performed at [facility], [address] [place], [place] [address]    Culture >=100,000 COLONIES/mL ESCHERICHIA COLI (A)  Final   Report Status 08/19/2023 FINAL  Final   Organism ID, Bacteria ESCHERICHIA COLI (A)  Final      Susceptibility   Escherichia coli - MIC*    AMPICILLIN 4 SENSITIVE Sensitive     CEFAZOLIN <=4 SENSITIVE Sensitive     CEFEPIME <=0.12 SENSITIVE Sensitive     CEFTRIAXONE <=0.25 SENSITIVE Sensitive     CIPROFLOXACIN <=0.25 SENSITIVE Sensitive     GENTAMICIN <=1 SENSITIVE Sensitive     IMIPENEM <=0.25 SENSITIVE Sensitive     NITROFURANTOIN  <=16 SENSITIVE Sensitive     TRIMETH/SULFA <=20 SENSITIVE Sensitive     AMPICILLIN/SULBACTAM <=2 SENSITIVE Sensitive     PIP/TAZO <=4 SENSITIVE Sensitive ug/mL    * >=100,000 COLONIES/mL ESCHERICHIA COLI  Culture, blood (Routine X 2) w Reflex to ID Panel     Status: None (Preliminary result)   Collection Time: 08/18/23 12:39 PM   Specimen: BLOOD RIGHT ARM  Result Value Ref Range Status   Specimen Description   Final    BLOOD RIGHT ARM Performed at [facility], [address], [place], [place] [address]    Special Requests   Final    BOTTLES DRAWN AEROBIC AND ANAEROBIC Blood Culture adequate volume Performed at [facility], [address], [place], [place] [address]    Culture   Final    NO GROWTH 2 DAYS Performed at [facility], [address] [place], [place] [address]    Report Status PENDING  Incomplete  Culture, blood (Routine X 2) w Reflex to ID Panel     Status: None (Preliminary result)   Collection Time: 08/18/23 12:41 PM   Specimen: BLOOD  Result Value Ref Range Status   Specimen Description   Final    BLOOD BLOOD RIGHT ARM Performed at [facility], [address] [place], [place] [address]    Special Requests   Final    BOTTLES DRAWN AEROBIC AND ANAEROBIC Blood Culture adequate volume Performed at [facility], [address], [place], [place] [address]     Culture   Final    NO GROWTH 2 DAYS Performed at [facility], [address] [place], [place] [address]    Report Status PENDING  Incomplete  Surgical PCR screen     Status: Abnormal   Collection Time: 08/20/23  1:15 AM   Specimen: Nasal Mucosa; Nasal Swab  Result Value Ref Range Status   MRSA, PCR NEGATIVE NEGATIVE Final   Staphylococcus aureus POSITIVE (A) NEGATIVE Final  Comment: (NOTE) The Xpert SA Assay (FDA approved for NASAL specimens in patients 38 years of age and older), is one component of a comprehensive surveillance program. It is not intended to diagnose infection nor to guide or monitor treatment. Performed at [facility], [address], [place], [place] [address]     Studies/Results: DG C-Arm 1-60 Min-No Report Result Date: 08/20/2023 Fluoroscopy was utilized by the requesting physician.  No radiographic interpretation.    Assessment: Unable to locate right ureteral orifice - completely obstructed right kidney.   Have consulted IR in hopes that they are willing to attempt placement of a neph tube and/or a nephro-ureteral stent.  She is anticoagulated, and given her heart issues she may need a heparin  bridge.  Once her kidney is decompressed I'm hopeful that she'll start to feel better and recover.  Plan: I'll f/u with her in clinic and discuss further management of her bladder cancer and obstructed right kidney.   Salli Crawley, MD Urology 08/21/2023, 8:55 AM

## 2023-08-21 NOTE — Progress Notes (Signed)
 Progress Note  Patient Name: Laurie Bowman Date of Encounter: 08/21/2023  Primary Cardiologist: Zoe Hinds, MD   Subjective   Patient see and examined at her bedside. Still in sinus rhythm. Plan for nephroureteral stent by IR later today.  She is aware  Inpatient Medications    Scheduled Meds:  [START ON 08/22/2023] amiodarone   200 mg Oral BID   [START ON 08/29/2023] amiodarone   200 mg Oral Daily   amiodarone   400 mg Oral BID   amoxicillin-clavulanate  1 tablet Oral Q12H   buPROPion   150 mg Oral Daily   mupirocin ointment  1 Application Nasal BID   pantoprazole   40 mg Oral Daily   phosphorus  500 mg Oral BID   verapamil   120 mg Oral Daily   Continuous Infusions:  levofloxacin (LEVAQUIN) IV     magnesium  sulfate bolus IVPB     PRN Meds: acetaminophen    Vital Signs    Vitals:   08/20/23 1800 08/20/23 1815 08/20/23 1906 08/20/23 2222  BP:   (!) 155/77 (!) 143/62  Pulse: 63 66 60 65  Resp: (!) 21 18 20 16   Temp:   99.1 F (37.3 C) 98.1 F (36.7 C)  TempSrc:   Oral Oral  SpO2: 92% 93% 97% 96%  Weight:      Height:        Intake/Output Summary (Last 24 hours) at 08/21/2023 1145 Last data filed at 08/20/2023 2331 Gross per 24 hour  Intake 600 ml  Output 650 ml  Net -50 ml   Filed Weights   08/17/23 1532 08/20/23 1358  Weight: 62.6 kg 62.6 kg    Telemetry    Sinus rhythm, heart rate in the 70s- Personally Reviewed  ECG   - Personally Reviewed  Physical Exam   General: Comfortable, lying in the bed Head: Atraumatic, normal size  Eyes: PEERLA, EOMI  Neck: Supple, normal JVD Cardiac: Normal S1, S2; RRR; no murmurs, rubs, or gallops Lungs: Clear to auscultation bilaterally Abd: Soft, nontender, no hepatomegaly  Ext: warm, no edema Skin: Warm and dry, no rashes   Psych: Normal mood and affect   Labs    Chemistry Recent Labs  Lab 08/18/23 0034 08/19/23 0358 08/20/23 0415 08/21/23 0420  NA 127* 130* 130* 131*  K 3.4* 3.3* 3.2* 3.0*  CL 97* 100  100 101  CO2 21* 22 21* 22  GLUCOSE 97 97 110* 85  BUN 24* 23 17 15   CREATININE 1.43* 1.18* 1.18* 0.99  CALCIUM  8.6* 8.7* 8.2* 8.2*  PROT 5.9* 5.5* 5.4*  --   ALBUMIN 2.9* 2.7* 2.5*  --   AST 60* 78* 71*  --   ALT 26 38 46*  --   ALKPHOS 47 44 52  --   BILITOT 1.8* 1.6* 1.5*  --   GFRNONAA 37* 46* 46* 57*  ANIONGAP 9 8 9 8      Hematology Recent Labs  Lab 08/19/23 0358 08/20/23 0415 08/21/23 0420  WBC 5.4 8.6 8.1  RBC 3.43* 3.36* 3.19*  HGB 11.0* 10.7* 10.0*  HCT 33.5* 32.5* 30.6*  MCV 97.7 96.7 95.9  MCH 32.1 31.8 31.3  MCHC 32.8 32.9 32.7  RDW 13.2 13.3 13.5  PLT 148* 153 162    Cardiac EnzymesNo results for input(s): "TROPONINI" in the last 168 hours. No results for input(s): "TROPIPOC" in the last 168 hours.   BNP Recent Labs  Lab 08/17/23 2025  BNP 459.6*     DDimer No results for input(s): "DDIMER" in the  last 168 hours.   Radiology    DG C-Arm 1-60 Min-No Report Result Date: 08/20/2023 Fluoroscopy was utilized by the requesting physician.  No radiographic interpretation.    Cardiac Studies   echo  Patient Profile     83 y.o. female with atrial fibrillation rapid trickle of heart rate, hypertrophic cardiomyopathy  Assessment & Plan    Atrial Fibrillation with rapid ventricular rate- still in sinus rhythm, continue verapimil and amiodarone .  Eliquis  is being held due to upcoming procedure.  She will benefit from heparin  drip if agreeable.  Electrolyte abnormalities: Hypokalemia and hyomagnesemia - please keep K>4 and mag >2  Hypertrophic cardiomyopathy-recent echo shows significant resting gradient which I do believe this is in volume depletion given her recent nausea vomiting.  She would benefit from some IV fluids.  Will continue to monitor closely.  And it would really be in her best interest to maintain sinus rhythm as well because she is highly symptomatic in afib.  From a hypertrophic cardiomyopathy and A-fib standpoint she needs  anticoagulation despite CHA2DS2-VASc score.  NSTEMI-type II mismatch in the setting of atrial fibrillation at this time will not be pursuing any invasive testing.  Hypertension-blood pressure is at target.  She is on verapamil , will recommend to continue to hold her thiazide diuretic and minoxidil.  AKI-creatinine stable 1.18  Hyperlipidemia-continue to hold Crestor  for now  Rhabdomyolysis-has gotten some fluid per primary team.  Bladder cancer-getting outpatient treatment     For questions or updates, please contact [facility] Please consult [email or web address] for contact info under Cardiology/STEMI.      Signed, Jaiona Simien, DO  08/21/2023, 11:45 AM

## 2023-08-21 NOTE — Plan of Care (Signed)

## 2023-08-21 NOTE — Progress Notes (Signed)
 PROGRESS NOTE Laurie Bowman  ZOX:[id number] DOB: Sep 19, 1940 DOA: 08/17/2023 PCP: Sheilah Denver., MD  Brief Narrative/Hospital Course: 83 y.o. female with history of hypertrophic cardiomyopathy, paroxysmal atrial fibrillation, hyperlipidemia, recently diagnosed bladder cancer underwent BCG treatment last week at [facility] since then has been feeling weak tired fatigued poor appetite and also has been having increasing pain on micturition.  Had subjective feeling of fever chills.  Patient felt so weak today that she slid out of the bed did not hit her head or lose consciousness.  She's been admitted with general malaise, AKI.  CT with findings possibly concerning for a UTI.  Cardiology following for afib with RVR, elevated troponin.  Urology planning for stent-attempted but unsuccessful.      Subjective: Seen and examined Overnight afebrile BP stable on room air Unable to have a stent yesterday, heparin  bridge ordered patient has refused Left arm infiltration site swollen red and tender with distal pulses intact Labs reviewed this morning hypokalemia hyponatremia   Assessment and plan:  E coli UTI  Possible Pyelonephritis Right Sided Hydronephrosis Generalized Malaise  CT with trace gas in bladder and subtle perivesical stranding, mild right hydroureteronephrosis Pansensitive e. Coli on urine culture. Blood culture NGTD [place]  Continue antibiotic -it has been changed to Augmentin - Patient has cephalosporin allergy. Went for right ureteral stent placement but unable to locate right ureteral orifice completely obstructed right kidney, IR consulted for nephro tube and/ or nephroureteral stent   Hyponatremia Hypokalemia Hypophosphatemia Hypomagnesemia: Replace and monitor   Normocytic anemia: Hemoglobin holding around 10 to 11 g range.  Monitor  A-fib with RVR  Patient had RVR prior to procedure, TSH normal amiodarone  transition to p.o., continue verapamil , DOAC Eliquis  held and  heparin  we will order 6/4 but patient has refused> now agreeable.Cardiology following closely.   AKI: likely from poor oral intake and dehydration.CT with mild R hydroureteronephrosis (possibly with recent passage of stone or infection) Resolved   Acute Metabolic Encephalopathy  Suspect due to above-with UTI dehydration. continue delirium precaution fall precaution supportive care   Elevated troponin: Seen by cardiology echo done suspecting demand ischemia   Mild rhabdomyolysis  Elevated CK  Trend. Cont holding statin.UA concerning for myoglobinuria with moderate Hb and 0-5 RBC's  Bladder cancer S/p TURBT 06/2023, intravesicular BCG last week    Hyperlipidemia: hold statins until CK levels improved. Ck down in 333, Peak 1770   Hypertension Stable continue verapamil . Holding thiazide, holding minoxidil    Hypertrophic cardiomyopathy  Caution with volume    Depression Stable on Wellbutrin .   DVT prophylaxis: eliquis  Code Status:   Code Status: Full Code Family Communication: plan of care discussed with patient at bedside. Patient status is: Remains hospitalized because of severity of illness Level of care: Progressive   Dispo: The patient is from: home            Anticipated disposition: TBD Objective: Vitals last 24 hrs: Vitals:   08/20/23 1800 08/20/23 1815 08/20/23 1906 08/20/23 2222  BP:   (!) 155/77 (!) 143/62  Pulse: 63 66 60 65  Resp: (!) 21 18 20 16   Temp:   99.1 F (37.3 C) 98.1 F (36.7 C)  TempSrc:   Oral Oral  SpO2: 92% 93% 97% 96%  Weight:      Height:        Physical Examination: General exam: alert awake, oriented  HEENT:Oral mucosa moist, Ear/Nose WNL grossly Respiratory system: Bilaterally clear BS,no use of accessory muscle Cardiovascular system: S1 & S2 +,  No JVD. Gastrointestinal system: Abdomen soft,NT,ND, BS+ Nervous System: Alert, awake, moving all extremities,and following commands. Extremities: LE edema neg,distal peripheral pulses  palpable and warm. Left arm infiltration site swollen red and tender with distal pulses intact Skin: No rashes,no icterus. MSK: Normal muscle bulk,tone, power  Data Reviewed: I have personally reviewed following labs and imaging studies ( see epic result tab) CBC: Recent Labs  Lab 08/17/23 1612 08/18/23 0034 08/19/23 0358 08/20/23 0415 08/21/23 0420  WBC 11.2* 9.6 5.4 8.6 8.1  NEUTROABS  --  8.0* 4.3 7.1  --   HGB 13.1 11.6* 11.0* 10.7* 10.0*  HCT 39.5 35.2* 33.5* 32.5* 30.6*  MCV 95.4 97.5 97.7 96.7 95.9  PLT 178 158 148* 153 162   CMP: Recent Labs  Lab 08/17/23 1612 08/18/23 0034 08/19/23 0358 08/20/23 0415 08/21/23 0419 08/21/23 0420  NA 129* 127* 130* 130*  --  131*  K 3.6 3.4* 3.3* 3.2*  --  3.0*  CL 95* 97* 100 100  --  101  CO2 21* 21* 22 21*  --  22  GLUCOSE 94 97 97 110*  --  85  BUN 24* 24* 23 17  --  15  CREATININE 1.46* 1.43* 1.18* 1.18*  --  0.99  CALCIUM  9.5 8.6* 8.7* 8.2*  --  8.2*  MG  --   --  1.9 1.6* 1.8  --   PHOS  --   --  3.2 2.0* 2.6  --    GFR: Estimated Creatinine Clearance: 36.8 mL/min (by C-G formula based on SCr of 0.99 mg/dL). Recent Labs  Lab 08/17/23 1612 08/18/23 0034 08/19/23 0358 08/20/23 0415  AST 36 60* 78* 71*  ALT 23 26 38 46*  ALKPHOS 55 47 44 52  BILITOT 2.5* 1.8* 1.6* 1.5*  PROT 7.0 5.9* 5.5* 5.4*  ALBUMIN 3.6 2.9* 2.7* 2.5*    Recent Labs  Lab 08/17/23 1612  LIPASE 21   No results for input(s): "AMMONIA" in the last 168 hours. Coagulation Profile: No results for input(s): "INR", "PROTIME" in the last 168 hours. Unresulted Labs (From admission, onward)     Start     Ordered   08/22/23 0500  Basic metabolic panel  Tomorrow morning,   R       Question:  Specimen collection method  Answer:  Lab=Lab collect   08/21/23 0910   08/22/23 0500  Magnesium   Tomorrow morning,   R       Question:  Specimen collection method  Answer:  Lab=Lab collect   08/21/23 0910   08/21/23 0940  Protime-INR  Once,   R        08/21/23  0940   08/21/23 0500  Basic metabolic panel with GFR  Daily,   R     Question:  Specimen collection method  Answer:  Lab=Lab collect   08/20/23 1059   08/21/23 0500  CBC  Daily,   R     Question:  Specimen collection method  Answer:  Lab=Lab collect   08/20/23 1752           Antimicrobials/Microbiology: Anti-infectives (From admission, onward)    Start     Dose/Rate Route Frequency Ordered Stop   08/21/23 1400  levofloxacin (LEVAQUIN) IVPB 500 mg        500 mg 100 mL/hr over 60 Minutes Intravenous  Once 08/21/23 1021     08/20/23 1600  ceFAZolin (ANCEF) IVPB 2g/100 mL premix        2 g 200 mL/hr over  30 Minutes Intravenous  Once 08/20/23 1558 08/20/23 1630   08/20/23 1559  ceFAZolin (ANCEF) 2-4 GM/100ML-% IVPB       Note to Pharmacy: Gaines, Janel R: cabinet override      08/20/23 1559 08/20/23 1623   08/19/23 2200  amoxicillin-clavulanate (AUGMENTIN) 875-125 MG per tablet 1 tablet       Note to Pharmacy: Retime as appropriate based on last levaquin dose   1 tablet Oral Every 12 hours 08/19/23 1604     08/19/23 1800  levofloxacin (LEVAQUIN) IVPB 750 mg  Status:  Discontinued        750 mg 100 mL/hr over 90 Minutes Intravenous Every 48 hours 08/17/23 2305 08/18/23 0310   08/19/23 1800  levofloxacin (LEVAQUIN) IVPB 500 mg  Status:  Discontinued        500 mg 100 mL/hr over 60 Minutes Intravenous Every 48 hours 08/18/23 0310 08/19/23 1604   08/19/23 0730  ceFAZolin (ANCEF) IVPB 2g/100 mL premix  Status:  Discontinued        2 g 200 mL/hr over 30 Minutes Intravenous  Once 08/19/23 0724 08/19/23 1300   08/19/23 0704  ceFAZolin (ANCEF) 2-4 GM/100ML-% IVPB  Status:  Discontinued       Note to Pharmacy: Runyan, Tonya W: cabinet override      08/19/23 0704 08/19/23 1241   08/17/23 1815  levofloxacin (LEVAQUIN) IVPB 500 mg        500 mg 100 mL/hr over 60 Minutes Intravenous  Once 08/17/23 1808 08/17/23 1919         Component Value Date/Time   SDES  08/18/2023 1241    BLOOD  BLOOD RIGHT ARM Performed at [facility], [address], [place], [place] [address]    SPECREQUEST  08/18/2023 1241    BOTTLES DRAWN AEROBIC AND ANAEROBIC Blood Culture adequate volume Performed at [facility], [address], [place], [place] [address]    CULT  08/18/2023 1241    NO GROWTH 2 DAYS Performed at [facility], [address], [place], [place] [address]    REPTSTATUS PENDING 08/18/2023 1241    Procedures: Procedure(s) (LRB): CYSTOSCOPY, WITH RETROGRADE PYELOGRAM AND URETERAL STENT INSERTION (Right) Medications reviewed:  Scheduled Meds:  [START ON 08/22/2023] amiodarone   200 mg Oral BID   [START ON 08/29/2023] amiodarone   200 mg Oral Daily   amiodarone   400 mg Oral BID   amoxicillin-clavulanate  1 tablet Oral Q12H   buPROPion   150 mg Oral Daily   mupirocin ointment  1 Application Nasal BID   pantoprazole   40 mg Oral Daily   phosphorus  500 mg Oral BID   verapamil   120 mg Oral Daily   Continuous Infusions:  levofloxacin (LEVAQUIN) IV     magnesium  sulfate bolus IVPB      Britnie Colville, MD [facility] 08/21/2023, 10:43 AM

## 2023-08-21 NOTE — TOC Progression Note (Signed)
 Transition of Care [facility]) - Progression Note    Patient Details  Name: Laurie Bowman MRN: [id number] Date of Birth: October 14, 1940  Transition of Care [facility]) CM/SW Contact  Gertha Ku, LCSW Phone Number: 08/21/2023, 11:07 AM  Clinical Narrative:    CSW met with the pt at bedside to discuss recommendations for SNF placement. Pt is agreeable and expressed a preference for [facility]. CSW explained the placement process, including the need for insurance authorization. CSW will fax the pt's information for SNF placement.  Expected Discharge Plan: Skilled Nursing Facility Barriers to Discharge: Continued Medical Work up  Expected Discharge Plan and Services In-house Referral: Clinical Social Work     Living arrangements for the past 2 months: Single Family Home                 DME Arranged: N/A DME Agency: NA                   Social Determinants of Health (SDOH) Interventions SDOH Screenings   Food Insecurity: No Food Insecurity (08/17/2023)  Housing: Low Risk  (08/17/2023)  Transportation Needs: No Transportation Needs (08/17/2023)  Utilities: Not At Risk (08/17/2023)  Social Connections: Unknown (08/18/2023)  Tobacco Use: Medium Risk (08/20/2023)    Readmission Risk Interventions     No data to display

## 2023-08-21 NOTE — Sedation Documentation (Signed)
 RN Shanara Schnieders pulled 4mg  Versed and 200mcg Fentanyl in IR room pysix. Pt. Received 4mg  Versed and 200mcg Fentanyl throughout the procedure.

## 2023-08-21 NOTE — Progress Notes (Signed)
 PHARMACY - ANTICOAGULATION CONSULT NOTE  Pharmacy Consult for enoxaparin  Indication: atrial fibrillation  Allergies  Allergen Reactions   Cefaclor Itching    Other reaction(s): Flushing (ALLERGY/intolerance)   Codeine Itching and Nausea And Vomiting   Fructose Swelling and Nausea And Vomiting    Swelling and GI upset, Fructose Granules  Fructose Granules   Sulfa Antibiotics Swelling   Meperidine Nausea And Vomiting and Itching   Elemental Sulfur Swelling   Propoxyphene Nausea And Vomiting    Patient Measurements: Height: 5' (152.4 cm) Weight: 62.6 kg (138 lb) IBW/kg (Calculated) : 45.5 HEPARIN  DW (KG): 58.6  Vital Signs: Temp: 97.9 F (36.6 C) (06/05 1349) Temp Source: Oral (06/05 1349) BP: 103/71 (06/05 1540) Pulse Rate: 72 (06/05 1540)  Labs: Recent Labs    08/19/23 0358 08/20/23 0415 08/20/23 1808 08/21/23 0420 08/21/23 1021  HGB 11.0* 10.7*  --  10.0*  --   HCT 33.5* 32.5*  --  30.6*  --   PLT 148* 153  --  162  --   APTT  --   --  41*  --   --   LABPROT  --   --   --   --  18.6*  INR  --   --   --   --  1.5*  HEPARINUNFRC  --   --  >1.10*  --   --   CREATININE 1.18* 1.18*  --  0.99  --   CKTOTAL  --  333*  --   --   --     Estimated Creatinine Clearance: 36.8 mL/min (by C-G formula based on SCr of 0.99 mg/dL).    Medications:   [START ON 08/22/2023] amiodarone   200 mg Oral BID   [START ON 08/29/2023] amiodarone   200 mg Oral Daily   amiodarone   400 mg Oral BID   amoxicillin-clavulanate  1 tablet Oral Q12H   buPROPion   150 mg Oral Daily   enoxaparin  (LOVENOX ) injection  60 mg Subcutaneous Q12H   mupirocin ointment  1 Application Nasal BID   pantoprazole   40 mg Oral Daily   phosphorus  500 mg Oral BID   verapamil   120 mg Oral Daily    Assessment: 83 yo F on apixaban  5 mg po BID PTA for Afib.  Apixaban  is on hold peri-procedurally around new nephrostomy tube placement in IR on 6/5.  Pharmacy is consulted to dose Lovenox , resume 6 hours post IR  procedure.   Apixaban  5 mg po BID - last dose 6/4 @ 1018 am  Today, 08/21/2023: SCr improving, down to 0.99 CBC:  Hgb low, decreased to 10, Plt WNL No bleeding or complications reported.     Goal of Therapy:  Anti-Xa level 0.6-1 units/ml 4hrs after LMWH dose given Monitor platelets by anticoagulation protocol: Yes   Plan: Lovenox  1 mg/kg SQ q12h  Follow up labs, bleeding complications, ability to resume apixaban    Kendall Pauls PharmD, BCPS [facility] main pharmacy [phone] 08/21/2023 3:47 PM

## 2023-08-22 DIAGNOSIS — I422 Other hypertrophic cardiomyopathy: Secondary | ICD-10-CM | POA: Diagnosis not present

## 2023-08-22 DIAGNOSIS — N179 Acute kidney failure, unspecified: Secondary | ICD-10-CM | POA: Diagnosis not present

## 2023-08-22 LAB — BASIC METABOLIC PANEL WITH GFR
Anion gap: 8 (ref 5–15)
BUN: 18 mg/dL (ref 8–23)
CO2: 22 mmol/L (ref 22–32)
Calcium: 8.4 mg/dL — ABNORMAL LOW (ref 8.9–10.3)
Chloride: 103 mmol/L (ref 98–111)
Creatinine, Ser: 1.04 mg/dL — ABNORMAL HIGH (ref 0.44–1.00)
GFR, Estimated: 54 mL/min — ABNORMAL LOW (ref >60)
Glucose, Bld: 104 mg/dL — ABNORMAL HIGH (ref 70–99)
Potassium: 3 mmol/L — ABNORMAL LOW (ref 3.5–5.1)
Sodium: 133 mmol/L — ABNORMAL LOW (ref 135–145)

## 2023-08-22 LAB — CBC
HCT: 29.6 % — ABNORMAL LOW (ref 36.0–46.0)
Hemoglobin: 10 g/dL — ABNORMAL LOW (ref 12.0–15.0)
MCH: 31.6 pg (ref 26.0–34.0)
MCHC: 33.8 g/dL (ref 30.0–36.0)
MCV: 93.7 fL (ref 80.0–100.0)
Platelets: 203 10*3/uL (ref 150–400)
RBC: 3.16 MIL/uL — ABNORMAL LOW (ref 3.87–5.11)
RDW: 13.6 % (ref 11.5–15.5)
WBC: 7.6 10*3/uL (ref 4.0–10.5)
nRBC: 0 % (ref 0.0–0.2)

## 2023-08-22 LAB — MAGNESIUM: Magnesium: 1.8 mg/dL (ref 1.7–2.4)

## 2023-08-22 MED ORDER — TRAMADOL HCL 50 MG PO TABS
50.0000 mg | ORAL_TABLET | Freq: Four times a day (QID) | ORAL | Status: DC | PRN
  Administered 2023-08-22 – 2023-08-26 (×9): 50 mg via ORAL
  Filled 2023-08-22 (×10): qty 1

## 2023-08-22 MED ORDER — POTASSIUM CHLORIDE CRYS ER 20 MEQ PO TBCR
40.0000 meq | EXTENDED_RELEASE_TABLET | Freq: Once | ORAL | Status: AC
  Administered 2023-08-22: 40 meq via ORAL
  Filled 2023-08-22: qty 2

## 2023-08-22 MED ORDER — SODIUM CHLORIDE 0.9% FLUSH
5.0000 mL | Freq: Three times a day (TID) | INTRAVENOUS | Status: DC
  Administered 2023-08-22 – 2023-08-28 (×16): 5 mL

## 2023-08-22 MED ORDER — ONDANSETRON HCL 4 MG PO TABS
4.0000 mg | ORAL_TABLET | Freq: Three times a day (TID) | ORAL | Status: DC | PRN
  Administered 2023-08-25 – 2023-08-27 (×2): 4 mg via ORAL
  Filled 2023-08-22 (×2): qty 1

## 2023-08-22 MED ORDER — ROSUVASTATIN CALCIUM 10 MG PO TABS
10.0000 mg | ORAL_TABLET | Freq: Every day | ORAL | Status: DC
  Administered 2023-08-22 – 2023-08-28 (×7): 10 mg via ORAL
  Filled 2023-08-22 (×7): qty 1

## 2023-08-22 MED ORDER — FENTANYL CITRATE PF 50 MCG/ML IJ SOSY
6.2500 ug | PREFILLED_SYRINGE | Freq: Once | INTRAMUSCULAR | Status: AC
  Administered 2023-08-22: 6.5 ug via INTRAVENOUS
  Filled 2023-08-22: qty 1

## 2023-08-22 NOTE — Progress Notes (Signed)
 2 Days Post-Op Subjective: First time meeting Laurie Bowman.  She was a bit flustered this morning but overall doing well.  Right percutaneous nephrostomy tube was placed yesterday and is draining clear yellow urine with small amounts of blood tingeing and mucus.  We reviewed her case and plan and she wishes to continue the rest of her care with [facility].  Objective: Vital signs in last 24 hours: Temp:  [97.5 F (36.4 C)-98.4 F (36.9 C)] 97.9 F (36.6 C) (06/06 0959) Pulse Rate:  [59-77] 59 (06/06 0959) Resp:  [12-18] 15 (06/06 0357) BP: (103-144)/(59-100) 144/72 (06/06 0959) SpO2:  [91 %-98 %] 98 % (06/06 0959)  Assessment/Plan: # Malignant neoplasm of bladder # obstructed right kidney #UTI  Patient initially scheduled for cystoscopy with right ureteral stent placement on 6/3 but patient went into A-fib with RVR.  Able to proceed to the operating room on 6/4.  Ultimately right UO could not be visualized after significant effort and patient required right side percutaneous nephrostomy placement with interventional radiology on 6/5.  Trend labs.  Renal function near baseline.   Urinary output is only being tracked from the right PCNT.  However she has put out a little over a liter from that kidney over the last 24 hours.  Follow-up with interventional radiology in 6 to 8 weeks for possible ureteral stent conversion at that time.  Pansensitive E. coli on UCx.  Presently receiving course of Augmentin d/t multiple allergies.     Intake/Output from previous day: 06/05 0701 - 06/06 0700 In: 100 [IV Piggyback:100] Out: 1065 [Urine:1065]  Intake/Output this shift: No intake/output data recorded.  Physical Exam:  General: Alert and oriented CV: No cyanosis Lungs: equal chest rise Gu: Right percutaneous nephrostomy tube in place.  Dressings C/D/I.  Draining clear yellow urine with some mild blood tingeing and mucus.  Lab Results: Recent Labs    08/20/23 0415 08/21/23 0420  08/22/23 0348  HGB 10.7* 10.0* 10.0*  HCT 32.5* 30.6* 29.6*   BMET Recent Labs    08/21/23 0420 08/22/23 0348  NA 131* 133*  K 3.0* 3.0*  CL 101 103  CO2 22 22  GLUCOSE 85 104*  BUN 15 18  CREATININE 0.99 1.04*  CALCIUM  8.2* 8.4*  HGB 10.0* 10.0*  WBC 8.1 7.6     Studies/Results: IR  NEPHROURETERAL CATH PLACE RIGHT Result Date: 08/21/2023 INDICATION: Hydronephrosis. Distal ureteral obstruction. Failed retrograde stent placement EXAM: Procedures; 1.  RIGHT ANTEGRADE NEPHROSTOGRAM 2. ULTRASOUND AND FLUOROSCOPIC GUIDED RIGHT NEPHROURETERAL CATHETER PLACEMENT COMPARISON:  CT AP, 08/17/2023.  Or fluoroscopy, 08/20/2023. MEDICATIONS: Levofloxacin 500 mg IV; The antibiotic was administered in an appropriate time frame prior to skin puncture. ANESTHESIA/SEDATION: Moderate (conscious) sedation was employed during this procedure. A total of Versed 4 mg and Fentanyl 200 mcg was administered intravenously. Moderate Sedation Time: 44 minutes. The patient's level of consciousness and vital signs were monitored continuously by radiology nursing throughout the procedure under my direct supervision. CONTRAST:  70 mL Isovue 300 - administered into the renal collecting system FLUOROSCOPY: Radiation Exposure Index and estimated peak skin dose (PSD); Reference air kerma (RAK), 16 mGy. COMPLICATIONS: None immediate. PROCEDURE: Informed written consent was obtained from the patient after a discussion of the risks, benefits, and alternatives to treatment. The RIGHT flank region was prepped with sterile prep in a usual fashion, and a sterile drape was applied covering the operative field. A sterile gown and sterile gloves were used for the procedure. A timeout was performed prior to the  initiation of the procedure. A pre procedural spot fluoroscopic image was obtained of the upper abdomen. Ultrasound scanning performed of the kidney was negative for significant hydronephrosis. As such, the stone within inferior pole  was targeted using a combination of ultrasound and fluoroscopically with a 22 gauge Chiba needle. Access to the collecting system was confirmed with advancement of a Nitrex wire into the collecting system. The needle was exchanged for the inner 3 Fr catheter from an Accustick set and contrast injection confirmed access. An Accustick set was utilized to dilate the tract and was subsequently exchanged for a Kumpe catheter over a Bentson wire. The Kumpe catheter was advanced down the ureter and into the urinary bladder. Next, over an Amplatz wire, the track was further dilated ultimately allowing placement of a 10 Fr, 22 cm percutaneous nephroureteral catheter with end coiled and locked within the urinary bladder. Contrast was injected and several spot fluoroscopic images were obtained in various obliquities. The catheter was secured at the skin entrance site with an interrupted suture and a stat lock device and connected to a gravity bag. Dressings were applied. The patient tolerated procedure well without immediate postprocedural complication. FINDINGS: *Ultrasound scanning demonstrates a mildly dilated RIGHT collecting system. *Under a combination of ultrasound and fluoroscopic guidance, a posterior inferior calix was targeted, and access the urinary bladder obtained allowing placement of a 10 Fr, 26 cm percutaneous nephroureteral catheter with end coiled and locked within the urinary bladder. IMPRESSION: Successful placement of a RIGHT 10 Fr, 22 cm nephroureteral drainage catheter, with tip at the level of the urinary bladder. RECOMMENDATIONS: *The catheter was placed to gravity drainage, with anticipated capping trial after 24-48 hours. *The patient will return to [facility] ([facility]) for routine catheter evaluation and and exchange, with possible ureteral stent conversion in 6-8 weeks. Art Largo, MD [facility] Electronically Signed    By: Art Largo M.D.   On: 08/21/2023 17:23   DG C-Arm 1-60 Min-No Report Result Date: 08/20/2023 Fluoroscopy was utilized by the requesting physician.  No radiographic interpretation.      LOS: 4 days   Laurie Ar, NP [facility] Pager: [phone]  08/22/2023, 10:04 AM

## 2023-08-22 NOTE — Progress Notes (Signed)
 PROGRESS NOTE Laurie Bowman  PIR:[id number] DOB: 1941/02/24 DOA: 08/17/2023 PCP: Sheilah Denver., MD  Brief Narrative/Hospital Course: 83 y.o. female with history of hypertrophic cardiomyopathy, paroxysmal atrial fibrillation, hyperlipidemia, recently diagnosed bladder cancer underwent BCG treatment last week at [facility] since then has been feeling weak tired fatigued poor appetite and also has been having increasing pain on micturition.  Had subjective feeling of fever chills.  Patient felt so weak today that she slid out of the bed did not hit her head or lose consciousness.  She's been admitted with general malaise, AKI.  CT with findings possibly concerning for a UTI.  Cardiology following for afib with RVR, elevated troponin.  Urology planning for stent-attempted but unsuccessful> subsequently nephrostomy catheter placed by IR on the right  Subjective: Seen and examined  Complains of spasm pain on the right nephrostomy site  Nephrostomy bag with pinkish urine no clot-has been about the same since the procedure Overnight afebrile BP stable labs with mild hyponatremia hypokalemia stable renal function hemoglobin stable 10  Assessment and plan:  E coli UTI  Possible Pyelonephritis Right Sided Hydronephrosis Generalized Malaise  CT with trace gas in bladder and subtle perivesical stranding, mild right hydroureteronephrosis  Pansensitive e. Coli on urine culture. Blood culture NGTD> continue po Augmentin. Urology planning for stent-attempted but unsuccessful> subsequently nephrostomy catheter placed by IR on the right Urine appears pinkish monitor for clot > having spasm, added tramadol , wants to stay away from other opiates  Hyponatremia Hypokalemia Hypophosphatemia Hypomagnesemia: Mild hyponatremia present improving, replace potassium again, mag and Phos improved  Normocytic anemia: Hemoglobin holding ~ 10 to 11 g range.  Monitor  A-fib with RVR  Patient had RVR prior to  procedure, TSH normal amiodarone  transitioned to p.o Continue amiodarone , Eliquis  on hold for surgery plan to resume if okay with IR today.  Until then on Lovenox  Cardiology following closely.   AKI: Resolved.likely from poor oral intake and dehydration. CT with mild R hydroureteronephrosis (possibly with recent passage of stone or infection)   Acute Metabolic Encephalopathy  Suspect due to above-with UTI dehydration. continue delirium precaution fall precaution supportive care.   Elevated troponin: Seen by cardiology echo done suspect demand ischemia   Mild rhabdomyolysis  Elevated CK:  Cont holding statin.UA concerning for myoglobinuria with moderate Hb and 0-5 RBC's.  Bladder cancer S/p TURBT 06/2023, intravesicular BCG last week    Hyperlipidemia: Resime statins   Hypertension Stable continue verapamil . Holding thiazide, holding minoxidil    Hypertrophic cardiomyopathy  Caution with volume    Depression Stable on Wellbutrin .  Deconditioning/debility: Continue PT OT plan for skilled nursing.  Once cleared by  IR/urology-hopefully soon   DVT prophylaxis: eliquis  Code Status:   Code Status: Full Code Family Communication: plan of care discussed with patient at bedside. Patient status is: Remains hospitalized because of severity of illness Level of care: Progressive   Dispo: The patient is from: home            Anticipated disposition: TBD Objective: Vitals last 24 hrs: Vitals:   08/21/23 1605 08/21/23 1634 08/21/23 2000 08/22/23 0357  BP: 106/61 (!) 109/59 122/68 131/66  Pulse: 69 62 66 62  Resp: 12  15 15   Temp:  97.6 F (36.4 C) (!) 97.5 F (36.4 C) 98.4 F (36.9 C)  TempSrc:  Oral Oral Oral  SpO2: 97% 97% 98% 96%  Weight:      Height:        Physical Examination: General exam: alert awake, oriented at  baseline, older than stated age HEENT:Oral mucosa moist, Ear/Nose WNL grossly Respiratory system: Bilaterally clear BS,no use of accessory  muscle Cardiovascular system: S1 & S2 +, No JVD. Gastrointestinal system: Abdomen soft,NT,ND, BS+ Nervous System: Alert, awake, moving all extremities,and following commands.   Extremities: LE edema neg,distal peripheral pulses palpable and warm.  Skin: No rashes,no icterus. MSK: Normal muscle bulk,tone, power Nephrostomy tube in place with pinkish urine  Data Reviewed: I have personally reviewed following labs and imaging studies ( see epic result tab) CBC: Recent Labs  Lab 08/18/23 0034 08/19/23 0358 08/20/23 0415 08/21/23 0420 08/22/23 0348  WBC 9.6 5.4 8.6 8.1 7.6  NEUTROABS 8.0* 4.3 7.1  --   --   HGB 11.6* 11.0* 10.7* 10.0* 10.0*  HCT 35.2* 33.5* 32.5* 30.6* 29.6*  MCV 97.5 97.7 96.7 95.9 93.7  PLT 158 148* 153 162 203   CMP: Recent Labs  Lab 08/18/23 0034 08/19/23 0358 08/20/23 0415 08/21/23 0419 08/21/23 0420 08/22/23 0348  NA 127* 130* 130*  --  131* 133*  K 3.4* 3.3* 3.2*  --  3.0* 3.0*  CL 97* 100 100  --  101 103  CO2 21* 22 21*  --  22 22  GLUCOSE 97 97 110*  --  85 104*  BUN 24* 23 17  --  15 18  CREATININE 1.43* 1.18* 1.18*  --  0.99 1.04*  CALCIUM  8.6* 8.7* 8.2*  --  8.2* 8.4*  MG  --  1.9 1.6* 1.8  --  1.8  PHOS  --  3.2 2.0* 2.6  --   --    GFR: Estimated Creatinine Clearance: 35 mL/min (A) (by C-G formula based on SCr of 1.04 mg/dL (H)). Recent Labs  Lab 08/17/23 1612 08/18/23 0034 08/19/23 0358 08/20/23 0415  AST 36 60* 78* 71*  ALT 23 26 38 46*  ALKPHOS 55 47 44 52  BILITOT 2.5* 1.8* 1.6* 1.5*  PROT 7.0 5.9* 5.5* 5.4*  ALBUMIN 3.6 2.9* 2.7* 2.5*    Recent Labs  Lab 08/17/23 1612  LIPASE 21   No results for input(s): "AMMONIA" in the last 168 hours. Coagulation Profile:  Recent Labs  Lab 08/21/23 1021  INR 1.5*   Unresulted Labs (From admission, onward)     Start     Ordered   08/21/23 1500  Urine Culture (for pregnant, neutropenic or urologic patients or patients with an indwelling urinary catheter)  (Urine Culture)  Once,    R       Comments: R PCN access   Question:  Indication  Answer:  Flank Pain   08/21/23 1500   08/21/23 0500  Basic metabolic panel with GFR  Daily,   R     Question:  Specimen collection method  Answer:  Lab=Lab collect   08/20/23 1059   08/21/23 0500  CBC  Daily,   R     Question:  Specimen collection method  Answer:  Lab=Lab collect   08/20/23 1752           Antimicrobials/Microbiology: Anti-infectives (From admission, onward)    Start     Dose/Rate Route Frequency Ordered Stop   08/21/23 1400  levofloxacin (LEVAQUIN) IVPB 500 mg        500 mg 100 mL/hr over 60 Minutes Intravenous  Once 08/21/23 1021 08/21/23 1620   08/20/23 1600  ceFAZolin (ANCEF) IVPB 2g/100 mL premix        2 g 200 mL/hr over 30 Minutes Intravenous  Once 08/20/23 1558 08/20/23 1630  08/20/23 1559  ceFAZolin (ANCEF) 2-4 GM/100ML-% IVPB       Note to Pharmacy: Gaines, Janel R: cabinet override      08/20/23 1559 08/20/23 1623   08/19/23 2200  amoxicillin-clavulanate (AUGMENTIN) 875-125 MG per tablet 1 tablet       Note to Pharmacy: Retime as appropriate based on last levaquin dose   1 tablet Oral Every 12 hours 08/19/23 1604     08/19/23 1800  levofloxacin (LEVAQUIN) IVPB 750 mg  Status:  Discontinued        750 mg 100 mL/hr over 90 Minutes Intravenous Every 48 hours 08/17/23 2305 08/18/23 0310   08/19/23 1800  levofloxacin (LEVAQUIN) IVPB 500 mg  Status:  Discontinued        500 mg 100 mL/hr over 60 Minutes Intravenous Every 48 hours 08/18/23 0310 08/19/23 1604   08/19/23 0730  ceFAZolin (ANCEF) IVPB 2g/100 mL premix  Status:  Discontinued        2 g 200 mL/hr over 30 Minutes Intravenous  Once 08/19/23 0724 08/19/23 1300   08/19/23 0704  ceFAZolin (ANCEF) 2-4 GM/100ML-% IVPB  Status:  Discontinued       Note to Pharmacy: Runyan, Tonya W: cabinet override      08/19/23 0704 08/19/23 1241   08/17/23 1815  levofloxacin (LEVAQUIN) IVPB 500 mg        500 mg 100 mL/hr over 60 Minutes Intravenous  Once  08/17/23 1808 08/17/23 1919         Component Value Date/Time   SDES  08/18/2023 1241    BLOOD BLOOD RIGHT ARM Performed at [facility], [address], [place], [place] [address]    SPECREQUEST  08/18/2023 1241    BOTTLES DRAWN AEROBIC AND ANAEROBIC Blood Culture adequate volume Performed at [facility], [address], [place], [place] [address]    CULT  08/18/2023 1241    NO GROWTH 4 DAYS Performed at [facility], [address] [place], [place] [address]    REPTSTATUS PENDING 08/18/2023 1241    Procedures: Procedure(s) (LRB): CYSTOSCOPY, WITH RETROGRADE PYELOGRAM AND URETERAL STENT INSERTION (Right) Medications reviewed:  Scheduled Meds:  amiodarone   200 mg Oral BID   [START ON 08/29/2023] amiodarone   200 mg Oral Daily   amoxicillin-clavulanate  1 tablet Oral Q12H   buPROPion   150 mg Oral Daily   enoxaparin  (LOVENOX ) injection  60 mg Subcutaneous Q12H   mupirocin ointment  1 Application Nasal BID   pantoprazole   40 mg Oral Daily   phosphorus  500 mg Oral BID   potassium chloride   40 mEq Oral Once   rosuvastatin   10 mg Oral Daily   sodium chloride  flush  5 mL Intracatheter Q8H   verapamil   120 mg Oral Daily   Continuous Infusions:  magnesium  sulfate bolus IVPB      Akio Hudnall, MD [facility] 08/22/2023, 9:36 AM

## 2023-08-22 NOTE — Plan of Care (Signed)

## 2023-08-22 NOTE — Progress Notes (Signed)
 Progress Note  Patient Name: Laurie Bowman Date of Encounter: 08/22/2023  Primary Cardiologist: Zoe Hinds, MD   Subjective   Patient see and examined at her bedside. Still in sinus rhythm. Plan for nephroureteral stent by IR later today.  She is aware  Inpatient Medications    Scheduled Meds:  amiodarone   200 mg Oral BID   [START ON 08/29/2023] amiodarone   200 mg Oral Daily   amoxicillin-clavulanate  1 tablet Oral Q12H   buPROPion   150 mg Oral Daily   enoxaparin  (LOVENOX ) injection  60 mg Subcutaneous Q12H   mupirocin ointment  1 Application Nasal BID   pantoprazole   40 mg Oral Daily   phosphorus  500 mg Oral BID   potassium chloride   40 mEq Oral Once   rosuvastatin   10 mg Oral Daily   sodium chloride  flush  5 mL Intracatheter Q8H   verapamil   120 mg Oral Daily   Continuous Infusions:  magnesium  sulfate bolus IVPB     PRN Meds: acetaminophen , loperamide, traMADol    Vital Signs    Vitals:   08/21/23 1605 08/21/23 1634 08/21/23 2000 08/22/23 0357  BP: 106/61 (!) 109/59 122/68 131/66  Pulse: 69 62 66 62  Resp: 12  15 15   Temp:  97.6 F (36.4 C) (!) 97.5 F (36.4 C) 98.4 F (36.9 C)  TempSrc:  Oral Oral Oral  SpO2: 97% 97% 98% 96%  Weight:      Height:        Intake/Output Summary (Last 24 hours) at 08/22/2023 0904 Last data filed at 08/22/2023 0454 Gross per 24 hour  Intake 100 ml  Output 1065 ml  Net -965 ml   Filed Weights   08/17/23 1532 08/20/23 1358  Weight: 62.6 kg 62.6 kg    Telemetry    Sinus rhythm, heart rate in the 70s- Personally Reviewed  ECG   - Personally Reviewed  Physical Exam   General: Comfortable, lying in the bed Head: Atraumatic, normal size  Eyes: PEERLA, EOMI  Neck: Supple, normal JVD Cardiac: Normal S1, S2; RRR; no murmurs, rubs, or gallops Lungs: Clear to auscultation bilaterally Abd: Soft, nontender, no hepatomegaly  Ext: warm, no edema Skin: Warm and dry, no rashes   Psych: Normal mood and affect   Labs     Chemistry Recent Labs  Lab 08/18/23 0034 08/19/23 0358 08/20/23 0415 08/21/23 0420 08/22/23 0348  NA 127* 130* 130* 131* 133*  K 3.4* 3.3* 3.2* 3.0* 3.0*  CL 97* 100 100 101 103  CO2 21* 22 21* 22 22  GLUCOSE 97 97 110* 85 104*  BUN 24* 23 17 15 18   CREATININE 1.43* 1.18* 1.18* 0.99 1.04*  CALCIUM  8.6* 8.7* 8.2* 8.2* 8.4*  PROT 5.9* 5.5* 5.4*  --   --   ALBUMIN 2.9* 2.7* 2.5*  --   --   AST 60* 78* 71*  --   --   ALT 26 38 46*  --   --   ALKPHOS 47 44 52  --   --   BILITOT 1.8* 1.6* 1.5*  --   --   GFRNONAA 37* 46* 46* 57* 54*  ANIONGAP 9 8 9 8 8      Hematology Recent Labs  Lab 08/20/23 0415 08/21/23 0420 08/22/23 0348  WBC 8.6 8.1 7.6  RBC 3.36* 3.19* 3.16*  HGB 10.7* 10.0* 10.0*  HCT 32.5* 30.6* 29.6*  MCV 96.7 95.9 93.7  MCH 31.8 31.3 31.6  MCHC 32.9 32.7 33.8  RDW 13.3 13.5 13.6  PLT 153 162 203    Cardiac EnzymesNo results for input(s): "TROPONINI" in the last 168 hours. No results for input(s): "TROPIPOC" in the last 168 hours.   BNP Recent Labs  Lab 08/17/23 2025  BNP 459.6*     DDimer No results for input(s): "DDIMER" in the last 168 hours.   Radiology    IR  NEPHROURETERAL CATH PLACE RIGHT Result Date: 08/21/2023 INDICATION: Hydronephrosis. Distal ureteral obstruction. Failed retrograde stent placement EXAM: Procedures; 1.  RIGHT ANTEGRADE NEPHROSTOGRAM 2. ULTRASOUND AND FLUOROSCOPIC GUIDED RIGHT NEPHROURETERAL CATHETER PLACEMENT COMPARISON:  CT AP, 08/17/2023.  Or fluoroscopy, 08/20/2023. MEDICATIONS: Levofloxacin 500 mg IV; The antibiotic was administered in an appropriate time frame prior to skin puncture. ANESTHESIA/SEDATION: Moderate (conscious) sedation was employed during this procedure. A total of Versed 4 mg and Fentanyl 200 mcg was administered intravenously. Moderate Sedation Time: 44 minutes. The patient's level of consciousness and vital signs were monitored continuously by radiology nursing throughout the procedure under my direct  supervision. CONTRAST:  70 mL Isovue 300 - administered into the renal collecting system FLUOROSCOPY: Radiation Exposure Index and estimated peak skin dose (PSD); Reference air kerma (RAK), 16 mGy. COMPLICATIONS: None immediate. PROCEDURE: Informed written consent was obtained from the patient after a discussion of the risks, benefits, and alternatives to treatment. The RIGHT flank region was prepped with sterile prep in a usual fashion, and a sterile drape was applied covering the operative field. A sterile gown and sterile gloves were used for the procedure. A timeout was performed prior to the initiation of the procedure. A pre procedural spot fluoroscopic image was obtained of the upper abdomen. Ultrasound scanning performed of the kidney was negative for significant hydronephrosis. As such, the stone within inferior pole was targeted using a combination of ultrasound and fluoroscopically with a 22 gauge Chiba needle. Access to the collecting system was confirmed with advancement of a Nitrex wire into the collecting system. The needle was exchanged for the inner 3 Fr catheter from an Accustick set and contrast injection confirmed access. An Accustick set was utilized to dilate the tract and was subsequently exchanged for a Kumpe catheter over a Bentson wire. The Kumpe catheter was advanced down the ureter and into the urinary bladder. Next, over an Amplatz wire, the track was further dilated ultimately allowing placement of a 10 Fr, 22 cm percutaneous nephroureteral catheter with end coiled and locked within the urinary bladder. Contrast was injected and several spot fluoroscopic images were obtained in various obliquities. The catheter was secured at the skin entrance site with an interrupted suture and a stat lock device and connected to a gravity bag. Dressings were applied. The patient tolerated procedure well without immediate postprocedural complication. FINDINGS: *Ultrasound scanning demonstrates a mildly  dilated RIGHT collecting system. *Under a combination of ultrasound and fluoroscopic guidance, a posterior inferior calix was targeted, and access the urinary bladder obtained allowing placement of a 10 Fr, 26 cm percutaneous nephroureteral catheter with end coiled and locked within the urinary bladder. IMPRESSION: Successful placement of a RIGHT 10 Fr, 22 cm nephroureteral drainage catheter, with tip at the level of the urinary bladder. RECOMMENDATIONS: *The catheter was placed to gravity drainage, with anticipated capping trial after 24-48 hours. *The patient will return to Vascular Interventional Radiology (VIR) for routine catheter evaluation and and exchange, with possible ureteral stent conversion in 6-8 weeks. Art Largo, MD [facility] Radiology Electronically Signed   By: Art Largo M.D.   On: 08/21/2023 17:23  DG C-Arm 1-60 Min-No Report Result Date: 08/20/2023 Fluoroscopy was utilized by the requesting physician.  No radiographic interpretation.    Cardiac Studies   echo  Patient Profile     83 y.o. female with atrial fibrillation rapid trickle of heart rate, hypertrophic cardiomyopathy  Assessment & Plan    Atrial Fibrillation with rapid ventricular rate- still in sinus rhythm, continue verapimil and amiodarone .  Eliquis  held for her procedure - once IR clears please transition her back to her Eliquis .    Electrolyte abnormalities: Hypokalemia and hyomagnesemia - please keep K>4 and mag >2  Hypertrophic cardiomyopathy-recent echo shows significant resting gradient which I do believe this is in volume depletion given her recent nausea vomiting.  She would benefit from some IV fluids.  Will continue to monitor closely.  And it would really be in her best interest to maintain sinus rhythm as well because she is highly symptomatic in afib.  From a hypertrophic cardiomyopathy and A-fib standpoint she needs anticoagulation despite  CHA2DS2-VASc score.  NSTEMI-type II mismatch in the setting of atrial fibrillation at this time will not be pursuing any invasive testing.  Hypertension-blood pressure is at target.  She is on verapamil , will recommend to continue to hold her thiazide diuretic and minoxidil.  AKI-creatinine improved / normal yesterday but slightly elevated today 1.04  Hyperlipidemia-continue to hold Crestor  for now  Rhabdomyolysis-has gotten some fluid per primary team.  Bladder cancer-getting outpatient treatment  Plans for discharge to SNF    For questions or updates, please contact [facility] Please consult [email or web address] for contact info under Cardiology/STEMI.      Signed, Sol Englert, DO  08/22/2023, 9:04 AM

## 2023-08-22 NOTE — TOC Progression Note (Signed)
 Transition of Care [facility]) - Progression Note    Patient Details  Name: Laurie Bowman MRN: [id number] Date of Birth: 06-Jul-1940  [facility] CM/SW Contact  Gertha Ku, LCSW Phone Number: 08/22/2023, 2:43 PM  Clinical Narrative:     Bed offers presented, pt reviewing TOC to follow.  Expected Discharge Plan: Skilled Nursing Facility Barriers to Discharge: Continued Medical Work up  Expected Discharge Plan and Services In-house Referral: Clinical Social Work     Living arrangements for the past 2 months: Single Family Home                 DME Arranged: N/A DME Agency: NA                   Social Determinants of Health (SDOH) Interventions SDOH Screenings   Food Insecurity: No Food Insecurity (08/17/2023)  Housing: Low Risk  (08/17/2023)  Transportation Needs: No Transportation Needs (08/17/2023)  Utilities: Not At Risk (08/17/2023)  Social Connections: Unknown (08/18/2023)  Tobacco Use: Medium Risk (08/20/2023)    Readmission Risk Interventions     No data to display

## 2023-08-22 NOTE — Progress Notes (Addendum)
 Referring Physician(s): Herrick,B  Supervising Physician: Elene Griffes  Patient Status:  Laurie Bowman  Chief Complaint: Pelvic/flank discomfort, bladder cancer, obstructed right kidney; s/p right nephroureteral catheter placement 6/5   Subjective: Pt eating lunch; son in room; has some cont rt flank/rt lat abd/RLQ discomfort   Allergies: Cefaclor, Codeine, Fructose, Sulfa antibiotics, Meperidine, Elemental sulfur, and Propoxyphene  Medications: Prior to Admission medications   Medication Sig Start Date End Date Taking? Authorizing Provider  acetaminophen  (TYLENOL ) 500 MG tablet Take 500 mg by mouth every 4 (four) hours as needed for moderate pain (pain score 4-6). 09/18/20  Yes [provider]  albuterol  (PROVENTIL ) (2.5 MG/3ML) 0.083% nebulizer solution Take 3 mLs (2.5 mg total) by nebulization every 6 (six) hours as needed for wheezing or shortness of breath. 06/18/21  Yes Rochester Chuck, MD  albuterol  (VENTOLIN  HFA) [phone] Base) MCG/ACT inhaler USE 1-2 PUFFS EVERY 4-6 HOURS AS NEEDED FOR COUGH OR WHEEZE. 07/02/22  Yes Rochester Chuck, MD  apixaban  (ELIQUIS ) 5 MG TABS tablet Take 1 tablet (5 mg total) by mouth 2 (two) times daily. 06/19/23  Yes Tammie Fall, MD  augmented betamethasone dipropionate (DIPROLENE-AF) 0.05 % cream Apply 0.05 application  topically daily as needed (Lichen).   Yes [provider]  benzonatate  (TESSALON ) 100 MG capsule Take 100 mg by mouth daily as needed for cough. 07/02/18  Yes [provider]  buPROPion  (WELLBUTRIN  XL) 150 MG 24 hr tablet Take 150 mg by mouth daily. 12/02/21  Yes [provider]  Cholecalciferol  (VITAMIN D) 50 MCG (2000 UT) CAPS Take 2,000 Units by mouth daily.   Yes [provider]  clobetasol cream (TEMOVATE) 0.05 % Apply 1 application  topically daily as needed (Scratches). 03/22/18  Yes [provider]  clotrimazole-betamethasone (LOTRISONE) cream Apply 1 application   topically daily as needed (Fugus). 03/16/19  Yes [provider]  diclofenac Sodium (VOLTAREN) 1 % GEL Apply 2 g topically 4 (four) times daily as needed (Pain). Both knees 04/16/19  Yes [provider]  dicyclomine  (BENTYL ) 20 MG tablet Take 20 mg by mouth daily as needed for spasms. 04/14/18  Yes [provider]  fexofenadine (ALLEGRA) 180 MG tablet Take 180 mg by mouth daily.    Yes [provider]  fluticasone  (FLOVENT  HFA) 110 MCG/ACT inhaler Use two puffs twice daily for one to two weeks during flares Patient taking differently: Inhale 2 puffs into the lungs daily as needed (Asthma). 06/26/23  Yes Rochester Chuck, MD  hydrochlorothiazide  (HYDRODIURIL ) 25 MG tablet Take 1 tablet (25 mg total) by mouth daily. 09/04/21 08/18/23 Yes Arrien, Curlee Doss, MD  lansoprazole  (PREVACID ) 30 MG capsule Take 1 capsule (30 mg total) by mouth 2 (two) times daily before a meal. 07/10/21  Yes Rochester Chuck, MD  minoxidil (LONITEN) 2.5 MG tablet Take 1.25 mg by mouth daily. 07/16/23  Yes [provider]  mometasone  (NASONEX ) 50 MCG/ACT nasal spray Place 2 sprays into the nose daily. Patient taking differently: Place 2 sprays into the nose daily as needed (Rhinitis). 06/26/23  Yes Rochester Chuck, MD  montelukast  (SINGULAIR ) 10 MG tablet Take 1 tablet (10 mg total) by mouth at bedtime. 01/09/23  Yes Rochester Chuck, MD  olopatadine  (PATANOL) 0.1 % ophthalmic solution Place 1 drop into both eyes 2 (two) times daily as needed. 04/18/20  Yes Rochester Chuck, MD  Polyethylene Glycol 3350  (PEG 3350 ) POWD Take 17 g by mouth daily.  06/24/16  Yes  [provider]  potassium chloride  (KLOR-CON ) 10 MEQ tablet Take 10-20 mEq by mouth See admin instructions. Take 10 mEq (1 tablet) by mouth every morning and 20 mEq (2 tablets) every night. 04/21/15  Yes [provider]  rosuvastatin  (CRESTOR ) 10 MG tablet Take 10 mg by mouth daily. 07/13/21  Yes  [provider]  tiZANidine (ZANAFLEX) 4 MG tablet Take 2 mg by mouth at bedtime as needed (Sleep). 11/16/19  Yes [provider]  triamcinolone cream (KENALOG) 0.1 % Apply 1 application  topically 2 (two) times daily as needed (Eyes). 12/14/18  Yes [provider]  verapamil  (CALAN -SR) 180 MG CR tablet Take 180 mg by mouth daily. 08/06/23  Yes [provider]     Vital Signs: BP (!) 144/72 (BP Location: Right Arm)   Pulse (!) 59   Temp 97.9 F (36.6 C) (Oral)   Resp 15   Ht 5' (1.524 m)   Wt 138 lb (62.6 kg)   LMP  (LMP Unknown)   SpO2 98%   BMI 26.95 kg/m   Physical Exam: awake,answers questions ok; rt PCN intact, insertion site with some old blood at insertion site, site mild-mod tender, sl blood-tinged urine in bag, OP 350 cc today, 1 liter yesterday ; drain flushed without difficulty  Imaging: IR  NEPHROURETERAL CATH PLACE RIGHT Result Date: 08/21/2023 INDICATION: Hydronephrosis. Distal ureteral obstruction. Failed retrograde stent placement EXAM: Procedures; 1.  RIGHT ANTEGRADE NEPHROSTOGRAM 2. ULTRASOUND AND FLUOROSCOPIC GUIDED RIGHT NEPHROURETERAL CATHETER PLACEMENT COMPARISON:  CT AP, 08/17/2023.  Or fluoroscopy, 08/20/2023. MEDICATIONS: Levofloxacin 500 mg IV; The antibiotic was administered in an appropriate time frame prior to skin puncture. ANESTHESIA/SEDATION: Moderate (conscious) sedation was employed during this procedure. A total of Versed 4 mg and Fentanyl 200 mcg was administered intravenously. Moderate Sedation Time: 44 minutes. The patient's level of consciousness and vital signs were monitored continuously by radiology nursing throughout the procedure under my direct supervision. CONTRAST:  70 mL Isovue 300 - administered into the renal collecting system FLUOROSCOPY: Radiation Exposure Index and estimated peak skin dose (PSD); Reference air kerma (RAK), 16 mGy. COMPLICATIONS: None immediate. PROCEDURE: Informed written consent was obtained  from the patient after a discussion of the risks, benefits, and alternatives to treatment. The RIGHT flank region was prepped with sterile prep in a usual fashion, and a sterile drape was applied covering the operative field. A sterile gown and sterile gloves were used for the procedure. A timeout was performed prior to the initiation of the procedure. A pre procedural spot fluoroscopic image was obtained of the upper abdomen. Ultrasound scanning performed of the kidney was negative for significant hydronephrosis. As such, the stone within inferior pole was targeted using a combination of ultrasound and fluoroscopically with a 22 gauge Chiba needle. Access to the collecting system was confirmed with advancement of a Nitrex wire into the collecting system. The needle was exchanged for the inner 3 Fr catheter from an Accustick set and contrast injection confirmed access. An Accustick set was utilized to dilate the tract and was subsequently exchanged for a Kumpe catheter over a Bentson wire. The Kumpe catheter was advanced down the ureter and into the urinary bladder. Next, over an Amplatz wire, the track was further dilated ultimately allowing placement of a 10 Fr, 22 cm percutaneous nephroureteral catheter with end coiled and locked within the urinary bladder. Contrast was injected and several spot fluoroscopic images were obtained in various obliquities. The catheter was secured at the skin entrance site with an  interrupted suture and a stat lock device and connected to a gravity bag. Dressings were applied. The patient tolerated procedure well without immediate postprocedural complication. FINDINGS: *Ultrasound scanning demonstrates a mildly dilated RIGHT collecting system. *Under a combination of ultrasound and fluoroscopic guidance, a posterior inferior calix was targeted, and access the urinary bladder obtained allowing placement of a 10 Fr, 26 cm percutaneous nephroureteral catheter with end coiled and locked  within the urinary bladder. IMPRESSION: Successful placement of a RIGHT 10 Fr, 22 cm nephroureteral drainage catheter, with tip at the level of the urinary bladder. RECOMMENDATIONS: *The catheter was placed to gravity drainage, with anticipated capping trial after 24-48 hours. *The patient will return to Vascular Interventional Radiology (VIR) for routine catheter evaluation and and exchange, with possible ureteral stent conversion in 6-8 weeks. Art Largo, MD [facility] Radiology Electronically Signed   By: Art Largo M.D.   On: 08/21/2023 17:23   DG C-Arm 1-60 Min-No Report Result Date: 08/20/2023 Fluoroscopy was utilized by the requesting physician.  No radiographic interpretation.    Labs:  CBC: Recent Labs    08/19/23 0358 08/20/23 0415 08/21/23 0420 08/22/23 0348  WBC 5.4 8.6 8.1 7.6  HGB 11.0* 10.7* 10.0* 10.0*  HCT 33.5* 32.5* 30.6* 29.6*  PLT 148* 153 162 203    COAGS: Recent Labs    08/20/23 1808 08/21/23 1021  INR  --  1.5*  APTT 41*  --     BMP: Recent Labs    08/19/23 0358 08/20/23 0415 08/21/23 0420 08/22/23 0348  NA 130* 130* 131* 133*  K 3.3* 3.2* 3.0* 3.0*  CL 100 100 101 103  CO2 22 21* 22 22  GLUCOSE 97 110* 85 104*  BUN 23 17 15 18   CALCIUM  8.7* 8.2* 8.2* 8.4*  CREATININE 1.18* 1.18* 0.99 1.04*  GFRNONAA 46* 46* 57* 54*    LIVER FUNCTION TESTS: Recent Labs    08/17/23 1612 08/18/23 0034 08/19/23 0358 08/20/23 0415  BILITOT 2.5* 1.8* 1.6* 1.5*  AST 36 60* 78* 71*  ALT 23 26 38 46*  ALKPHOS 55 47 44 52  PROT 7.0 5.9* 5.5* 5.4*  ALBUMIN 3.6 2.9* 2.7* 2.5*    Assessment and Plan: 83 y.o. 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    Electronically Signed: D. Honore Lux, PA-C 08/22/2023, 1:56 PM   I spent a total of 15 Minutes at the the patient's bedside AND on the patient's hospital floor or unit, greater than 50% of which was counseling/coordinating care for right nephroureteral catheter placement    Patient ID: Ayriel Texidor, female   DOB: Feb 05, 1941, 83 y.o.   MRN: [id number]

## 2023-08-22 NOTE — Plan of Care (Signed)

## 2023-08-22 NOTE — Progress Notes (Signed)
 PT Cancellation Note  Patient Details Name: Jarrah Seher MRN: [id number] DOB: 1941/01/10   Cancelled Treatment:    Reason Eval/Treat Not Completed: Fatigue/lethargy limiting ability to participate Pt reports she did not sleep well last night and had a stent placed this morning. She politely declined to participate today however requested therapist check back tomorrow.  Will check back as schedule permits.   Myna Asal Payson 08/22/2023, 11:29 AM Blanch Bunde, DPT Physical Therapist Acute Rehabilitation Services Office: [phone]

## 2023-08-23 DIAGNOSIS — I480 Paroxysmal atrial fibrillation: Secondary | ICD-10-CM | POA: Diagnosis not present

## 2023-08-23 DIAGNOSIS — R531 Weakness: Secondary | ICD-10-CM | POA: Diagnosis not present

## 2023-08-23 LAB — CBC
HCT: 31.8 % — ABNORMAL LOW (ref 36.0–46.0)
Hemoglobin: 10.4 g/dL — ABNORMAL LOW (ref 12.0–15.0)
MCH: 31.3 pg (ref 26.0–34.0)
MCHC: 32.7 g/dL (ref 30.0–36.0)
MCV: 95.8 fL (ref 80.0–100.0)
Platelets: 277 10*3/uL (ref 150–400)
RBC: 3.32 MIL/uL — ABNORMAL LOW (ref 3.87–5.11)
RDW: 13.6 % (ref 11.5–15.5)
WBC: 7.2 10*3/uL (ref 4.0–10.5)
nRBC: 0 % (ref 0.0–0.2)

## 2023-08-23 LAB — BASIC METABOLIC PANEL WITH GFR
Anion gap: 7 (ref 5–15)
BUN: 15 mg/dL (ref 8–23)
CO2: 25 mmol/L (ref 22–32)
Calcium: 8.7 mg/dL — ABNORMAL LOW (ref 8.9–10.3)
Chloride: 104 mmol/L (ref 98–111)
Creatinine, Ser: 1.16 mg/dL — ABNORMAL HIGH (ref 0.44–1.00)
GFR, Estimated: 47 mL/min — ABNORMAL LOW (ref >60)
Glucose, Bld: 93 mg/dL (ref 70–99)
Potassium: 3.6 mmol/L (ref 3.5–5.1)
Sodium: 136 mmol/L (ref 135–145)

## 2023-08-23 LAB — CULTURE, BLOOD (ROUTINE X 2)
Culture: NO GROWTH
Culture: NO GROWTH
Special Requests: ADEQUATE
Special Requests: ADEQUATE

## 2023-08-23 MED ORDER — ENSURE PLUS HIGH PROTEIN PO LIQD
237.0000 mL | Freq: Two times a day (BID) | ORAL | Status: DC
  Administered 2023-08-23 – 2023-08-26 (×5): 237 mL via ORAL

## 2023-08-23 NOTE — Progress Notes (Signed)
 Physical Therapy Treatment Patient Details Name: Laurie Bowman MRN: [id number] DOB: 27-Dec-1940 Today's Date: 08/23/2023   History of Present Illness 83 yo female admitted with ARF, UTI, hyponatremia, delirium. Pt s/p right nephroureteral catheter placement 6/5 Hx of L TKA, bladder Ca-undergoing tx, cardiomyopathy, lichen slerosus, osteoporosis, breast Ca, IBS, CKD, asthma, rhabdo, falls    PT Comments  Pt assisted with ambulating however only remained inside room today.  Pt also fatigued quickly.  Pt agreeable to remain OOB in recliner.  Patient will benefit from continued inpatient follow up therapy, <3 hours/day (unless 24/7 assist available at home).      If plan is discharge home, recommend the following: Assistance with cooking/housework;Assist for transportation;Help with stairs or ramp for entrance;A little help with walking and/or transfers;A little help with bathing/dressing/bathroom   Can travel by private vehicle        Equipment Recommendations  None recommended by PT    Recommendations for Other Services       Precautions / Restrictions Precautions Precautions: Fall Precaution/Restrictions Comments: R PCN drain     Mobility  Bed Mobility Overal bed mobility: Needs Assistance Bed Mobility: Supine to Sit     Supine to sit: Min assist     General bed mobility comments: increased time, assist for trunk    Transfers Overall transfer level: Needs assistance Equipment used: Rolling walker (2 wheels) Transfers: Sit to/from Stand Sit to Stand: Min assist, From elevated surface           General transfer comment: assist to rise, cues for hand placement    Ambulation/Gait Ambulation/Gait assistance: Contact guard assist Gait Distance (Feet): 40 Feet Assistive device: Rolling walker (2 wheels) Gait Pattern/deviations: Step-through pattern, Decreased stride length       General Gait Details: pt utilized RW for safety, preferred to ambulate in room today,  distance to tolerance; fatigued quickly   Stairs             Wheelchair Mobility     Tilt Bed    Modified Rankin (Stroke Patients Only)       Balance Overall balance assessment: Needs assistance Sitting-balance support: Feet supported Sitting balance-Leahy Scale: Fair     Standing balance support: Bilateral upper extremity supported, Reliant on assistive device for balance Standing balance-Leahy Scale: Poor                              Communication Communication Communication: No apparent difficulties  Cognition Arousal: Alert Behavior During Therapy: WFL for tasks assessed/performed   PT - Cognitive impairments: No apparent impairments                         Following commands: Intact      Cueing    Exercises      General Comments        Pertinent Vitals/Pain Pain Assessment Pain Assessment: Faces Faces Pain Scale: Hurts a little bit Pain Location: R side Pain Descriptors / Indicators: Tender, Sore, Discomfort, Guarding Pain Intervention(s): Repositioned, Monitored during session    Home Living                          Prior Function            PT Goals (current goals can now be found in the care plan section) Progress towards PT goals: Progressing toward goals    Frequency  Min 3X/week      PT Plan      Co-evaluation              AM-PAC PT "6 Clicks" Mobility   Outcome Measure  Help needed turning from your back to your side while in a flat bed without using bedrails?: A Little Help needed moving from lying on your back to sitting on the side of a flat bed without using bedrails?: A Little Help needed moving to and from a bed to a chair (including a wheelchair)?: A Little Help needed standing up from a chair using your arms (e.g., wheelchair or bedside chair)?: A Little Help needed to walk in hospital room?: A Lot Help needed climbing 3-5 steps with a railing? : A Lot 6 Click Score:  16    End of Session Equipment Utilized During Treatment: Gait belt Activity Tolerance: Patient tolerated treatment well;Patient limited by fatigue Patient left: in chair;with call bell/phone within reach   PT Visit Diagnosis: Muscle weakness (generalized) (M62.81);History of falling (Z91.81);Difficulty in walking, not elsewhere classified (R26.2)     Time: 4098-1191 PT Time Calculation (min) (ACUTE ONLY): 17 min  Charges:    $Gait Training: 8-22 mins PT General Charges $$ ACUTE PT VISIT: 1 Visit                     Henretta Lodge PT, DPT Physical Therapist Acute Rehabilitation Services Office: [phone]    Kati L Payson 08/23/2023, 1:25 PM

## 2023-08-23 NOTE — Plan of Care (Signed)

## 2023-08-23 NOTE — Progress Notes (Signed)
 Progress Note  Patient Name: Laurie Bowman Date of Encounter: 08/23/2023  Primary Cardiologist: Zoe Hinds, MD  Interval Summary  Chart reviewed.  Patient states that she feels better, up in chair and with better appetite.  She is maintaining sinus rhythm by telemetry.  Vital Signs  Vitals:   08/22/23 1742 08/22/23 1743 08/22/23 2007 08/23/23 0443  BP: (!) 152/64 (!) 151/73 (!) 143/70 138/71  Pulse: 62 63 66 (!) 58  Resp: 18  14 14   Temp: 98.4 F (36.9 C)  98.5 F (36.9 C) 98.4 F (36.9 C)  TempSrc: Oral   Oral  SpO2: 96%  94% 96%  Weight:      Height:        Intake/Output Summary (Last 24 hours) at 08/23/2023 1108 Last data filed at 08/23/2023 0919 Gross per 24 hour  Intake 725 ml  Output 1925 ml  Net -1200 ml   Filed Weights   08/17/23 1532 08/20/23 1358  Weight: 62.6 kg 62.6 kg    Physical Exam  GEN: No acute distress.   Neck: No JVD. Cardiac: RRR, 2/6 systolic murmur, no gallop.  Respiratory: Nonlabored. Clear to auscultation bilaterally. GI: Soft, nontender, bowel sounds present. MS: No edema.  ECG/Telemetry  Telemetry reviewed showing sinus rhythm.  Labs  Chemistry Recent Labs  Lab 08/18/23 0034 08/19/23 0358 08/20/23 0415 08/21/23 0420 08/22/23 0348 08/23/23 0418  NA 127* 130* 130* 131* 133* 136  K 3.4* 3.3* 3.2* 3.0* 3.0* 3.6  CL 97* 100 100 101 103 104  CO2 21* 22 21* 22 22 25   GLUCOSE 97 97 110* 85 104* 93  BUN 24* 23 17 15 18 15   CREATININE 1.43* 1.18* 1.18* 0.99 1.04* 1.16*  CALCIUM  8.6* 8.7* 8.2* 8.2* 8.4* 8.7*  PROT 5.9* 5.5* 5.4*  --   --   --   ALBUMIN 2.9* 2.7* 2.5*  --   --   --   AST 60* 78* 71*  --   --   --   ALT 26 38 46*  --   --   --   ALKPHOS 47 44 52  --   --   --   BILITOT 1.8* 1.6* 1.5*  --   --   --   GFRNONAA 37* 46* 46* 57* 54* 47*  ANIONGAP 9 8 9 8 8 7     Hematology Recent Labs  Lab 08/21/23 0420 08/22/23 0348 08/23/23 0418  WBC 8.1 7.6 7.2  RBC 3.19* 3.16* 3.32*  HGB 10.0* 10.0* 10.4*  HCT 30.6*  29.6* 31.8*  MCV 95.9 93.7 95.8  MCH 31.3 31.6 31.3  MCHC 32.7 33.8 32.7  RDW 13.5 13.6 13.6  PLT 162 203 277   Cardiac Enzymes Recent Labs  Lab 08/17/23 1612 08/17/23 2025 08/17/23 2303 08/18/23 0034  TROPONINIHS 35* 147* 287* 329*   Cardiac Studies  Echocardiogram 08/18/2023:  1. LV outflow tract peak gradient at rest 138 mmHg. Left ventricular  ejection fraction, by estimation, is 50 to 55%. The left ventricle has low  normal function. The left ventricle has no regional wall motion  abnormalities but there is septal-lateral  dyssynchrony consistent with LBBB. There is moderate asymmetric left  ventricular hypertrophy of the septal segment. Left ventricular diastolic  parameters are consistent with Grade I diastolic dysfunction (impaired  relaxation).   2. Right ventricular systolic function is normal. The right ventricular  size is normal. There is moderately elevated pulmonary artery systolic  pressure. The estimated right ventricular systolic pressure is 55.6 mmHg.  3. Left atrial size was moderately dilated.   4. Mitral valve systolic anterior motion is present. The mitral valve is  abnormal. Moderate eccentric mitral valve regurgitation. No evidence of  mitral stenosis.   5. The aortic valve is tricuspid. There is mild calcification of the  aortic valve. Aortic valve regurgitation is not visualized. No aortic  stenosis is present.   6. The inferior vena cava is normal in size with <50% respiratory  variability, suggesting right atrial pressure of 8 mmHg.   7. 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   Assessment & Plan  1.  Paroxysmal atrial fibrillation presenting with RVR.  She is maintaining sinus rhythm on amiodarone  200 mg twice daily.  Remains on Lovenox , Eliquis  held with recent procedures.  2.  Hypertrophic cardiomyopathy.  Updated echocardiogram noted above.  3.  Abnormal high-sensitivity troponin I levels consistent with demand ischemia.  4.  Primary  hypertension.  5.  AKI, creatinine 1.16 with GFR 47.  Currently on amiodarone  200 mg twice daily and Calan  SR 120 mg daily.  Also on Lovenox  pending resumption of Eliquis .  Would clarify with urology and IR when it would be appropriate to transition back to Eliquis .  For questions or updates, please contact [facility] Please consult [email or web address] for contact info under   Signed, Teddie Favre, MD  08/23/2023, 11:08 AM

## 2023-08-23 NOTE — Progress Notes (Signed)
 PROGRESS NOTE    Laurie Bowman  MWN:[id number] DOB: Oct 08, 1940 DOA: 08/17/2023 PCP: Sheilah Denver., MD   Brief Narrative: 83 y.o. female with history of hypertrophic cardiomyopathy, paroxysmal atrial fibrillation, hyperlipidemia, recently diagnosed bladder cancer underwent BCG treatment last week at [facility] since then has been feeling weak tired fatigued poor appetite and also has been having increasing pain on micturition.  Had subjective feeling of fever chills.  Patient felt so weak today that she slid out of the bed did not hit her head or lose consciousness.  She's been admitted with general malaise, AKI.  CT with findings possibly concerning for a UTI.  Cardiology following for afib with RVR, elevated troponin.  Urology planning for stent-attempted but unsuccessful> subsequently nephrostomy catheter placed by IR on the right     Assessment & Plan:   Principal Problem:   ARF (acute renal failure) (HCC) Active Problems:   Rhabdomyolysis   Hypertension   Hypertrophic cardiomyopathy (HCC)   Allergic rhinitis   Atrial fibrillation with RVR (HCC)   Elevated troponin   Acute UTI   Hyponatremia   Generalized weakness   E coli UTI  Possible Pyelonephritis Right Sided Hydronephrosis Generalized Malaise  CT with trace gas in bladder and subtle perivesical stranding, mild right hydroureteronephrosis  Pansensitive e. Coli on urine culture. Blood culture NGTD> continue po Augmentin . Urology planning for stent-attempted but unsuccessful> subsequently nephrostomy catheter placed by IR on the right Urine appears pinkish monitor for clot > having spasm, added tramadol , wants to stay away from other opiates   Hyponatremia Hypokalemia Hypophosphatemia Hypomagnesemia: All resolved   Normocytic anemia: Stable  A-fib with RVR  Patient had RVR prior to procedure, TSH normal amiodarone  transitioned to p.o Continue amiodarone , resume Eliquis  once okay with IR and urology.  Continue  Lovenox  for now.     AKI: Likely from poor oral intake and dehydration. CT with mild R hydroureteronephrosis (possibly with recent passage of stone or infection) Creatinine 1.16 from 1.04 from 0.99 monitor   Acute Metabolic Encephalopathy due to UTI dehydration improved   Elevated troponin: Seen by cardiology echo done suspect demand ischemia    Mild rhabdomyolysis  Elevated CK:  Cont holding statin.UA concerning for myoglobinuria with moderate Hb and 0-5 RBC's.   Bladder cancer S/p TURBT 06/2023, intravesicular BCG last week    Hyperlipidemia: Continue statins   Hypertension Stable continue verapamil . Holding thiazide, holding minoxidil    Hypertrophic cardiomyopathy  Monitor closely   Depression Stable on Wellbutrin .   Deconditioning/debility: Continue PT OT plan for skilled nursing.  Once cleared by  IR/urology-hopefully soon    Estimated body mass index is 26.95 kg/m as calculated from the following:   Height as of this encounter: 5' (1.524 m).   Weight as of this encounter: 62.6 kg.  DVT prophylaxis: Lovenox  Code Status: Full code  family Communication: None at bedside Disposition Plan:  Status is: Inpatient Remains inpatient appropriate because: Acute illness   Consultants:  Cardiology interventional radiology urology  Subjective: Patient was seen resting in bed early this morning right nephrostomy tube in place with blood-tinged urine noted complaints of spasm and pain at the site of the tube entry  Objective: Vitals:   08/22/23 1742 08/22/23 1743 08/22/23 2007 08/23/23 0443  BP: (!) 152/64 (!) 151/73 (!) 143/70 138/71  Pulse: 62 63 66 (!) 58  Resp: 18  14 14   Temp: 98.4 F (36.9 C)  98.5 F (36.9 C) 98.4 F (36.9 C)  TempSrc: Oral   Oral  SpO2: 96%  94% 96%  Weight:      Height:        Intake/Output Summary (Last 24 hours) at 08/23/2023 1244 Last data filed at 08/23/2023 0919 Gross per 24 hour  Intake 480 ml  Output 1675 ml  Net -1195 ml    Filed Weights   08/17/23 1532 08/20/23 1358  Weight: 62.6 kg 62.6 kg    Examination:  General exam: Appears in no acute distress Respiratory system: Clear to auscultation. Respiratory effort normal. Cardiovascular system: S1 & S2 heard, RRR. No JVD, murmurs, rubs, gallops or clicks. No pedal edema. Gastrointestinal system: Abdomen is nondistended, soft and nontender. No organomegaly or masses felt. Normal bowel sounds heard.  Right nephrostomy tube with blood-tinged urine Central nervous system: Alert and oriented. No focal neurological deficits. Extremities: Trace edema   Data Reviewed: I have personally reviewed following labs and imaging studies  CBC: Recent Labs  Lab 08/18/23 0034 08/19/23 0358 08/20/23 0415 08/21/23 0420 08/22/23 0348 08/23/23 0418  WBC 9.6 5.4 8.6 8.1 7.6 7.2  NEUTROABS 8.0* 4.3 7.1  --   --   --   HGB 11.6* 11.0* 10.7* 10.0* 10.0* 10.4*  HCT 35.2* 33.5* 32.5* 30.6* 29.6* 31.8*  MCV 97.5 97.7 96.7 95.9 93.7 95.8  PLT 158 148* 153 162 203 277   Basic Metabolic Panel: Recent Labs  Lab 08/19/23 0358 08/20/23 0415 08/21/23 0419 08/21/23 0420 08/22/23 0348 08/23/23 0418  NA 130* 130*  --  131* 133* 136  K 3.3* 3.2*  --  3.0* 3.0* 3.6  CL 100 100  --  101 103 104  CO2 22 21*  --  22 22 25   GLUCOSE 97 110*  --  85 104* 93  BUN 23 17  --  15 18 15   CREATININE 1.18* 1.18*  --  0.99 1.04* 1.16*  CALCIUM  8.7* 8.2*  --  8.2* 8.4* 8.7*  MG 1.9 1.6* 1.8  --  1.8  --   PHOS 3.2 2.0* 2.6  --   --   --    GFR: Estimated Creatinine Clearance: 31.4 mL/min (A) (by C-G formula based on SCr of 1.16 mg/dL (H)). Liver Function Tests: Recent Labs  Lab 08/17/23 1612 08/18/23 0034 08/19/23 0358 08/20/23 0415  AST 36 60* 78* 71*  ALT 23 26 38 46*  ALKPHOS 55 47 44 52  BILITOT 2.5* 1.8* 1.6* 1.5*  PROT 7.0 5.9* 5.5* 5.4*  ALBUMIN 3.6 2.9* 2.7* 2.5*   Recent Labs  Lab 08/17/23 1612  LIPASE 21   No results for input(s): "AMMONIA" in the last 168  hours. Coagulation Profile: Recent Labs  Lab 08/21/23 1021  INR 1.5*   Cardiac Enzymes: Recent Labs  Lab 08/17/23 2303 08/20/23 0415  CKTOTAL 1,770* 333*   BNP (last 3 results) No results for input(s): "PROBNP" in the last 8760 hours. HbA1C: No results for input(s): "HGBA1C" in the last 72 hours. CBG: Recent Labs  Lab 08/19/23 1946 08/20/23 0731  GLUCAP 126* 98   Lipid Profile: No results for input(s): "CHOL", "HDL", "LDLCALC", "TRIG", "CHOLHDL", "LDLDIRECT" in the last 72 hours. Thyroid Function Tests: No results for input(s): "TSH", "T4TOTAL", "FREET4", "T3FREE", "THYROIDAB" in the last 72 hours. Anemia Panel: No results for input(s): "VITAMINB12", "FOLATE", "FERRITIN", "TIBC", "IRON", "RETICCTPCT" in the last 72 hours. Sepsis Labs: No results for input(s): "PROCALCITON", "LATICACIDVEN" in the last 168 hours.  Recent Results (from the past 240 hours)  Urine Culture     Status: Abnormal   Collection  Time: 08/17/23  3:45 PM   Specimen: Urine, Clean Catch  Result Value Ref Range Status   Specimen Description   Final    URINE, CLEAN CATCH Performed at [facility], [address], [place], [place] [address]    Special Requests   Final    NONE Performed at [facility], [address] [place], [place] [address]    Culture >=100,000 COLONIES/mL ESCHERICHIA COLI (A)  Final   Report Status 08/19/2023 FINAL  Final   Organism ID, Bacteria ESCHERICHIA COLI (A)  Final      Susceptibility   Escherichia coli - MIC*    AMPICILLIN 4 SENSITIVE Sensitive     CEFAZOLIN  <=4 SENSITIVE Sensitive     CEFEPIME <=0.12 SENSITIVE Sensitive     CEFTRIAXONE <=0.25 SENSITIVE Sensitive     CIPROFLOXACIN <=0.25 SENSITIVE Sensitive     GENTAMICIN <=1 SENSITIVE Sensitive     IMIPENEM <=0.25 SENSITIVE Sensitive     NITROFURANTOIN  <=16 SENSITIVE Sensitive     TRIMETH/SULFA <=20 SENSITIVE Sensitive     AMPICILLIN/SULBACTAM <=2 SENSITIVE Sensitive      PIP/TAZO <=4 SENSITIVE Sensitive ug/mL    * >=100,000 COLONIES/mL ESCHERICHIA COLI  Culture, blood (Routine X 2) w Reflex to ID Panel     Status: None   Collection Time: 08/18/23 12:39 PM   Specimen: BLOOD RIGHT ARM  Result Value Ref Range Status   Specimen Description   Final    BLOOD RIGHT ARM Performed at [facility], [address] [place], [place] [address]    Special Requests   Final    BOTTLES DRAWN AEROBIC AND ANAEROBIC Blood Culture adequate volume Performed at [facility], [address], [place], [place] [address]    Culture   Final    NO GROWTH 5 DAYS Performed at [facility], [address] [place], [place] [address]    Report Status 08/23/2023 FINAL  Final  Culture, blood (Routine X 2) w Reflex to ID Panel     Status: None   Collection Time: 08/18/23 12:41 PM   Specimen: BLOOD  Result Value Ref Range Status   Specimen Description   Final    BLOOD BLOOD RIGHT ARM Performed at [facility], [address], [place], [place] [address]    Special Requests   Final    BOTTLES DRAWN AEROBIC AND ANAEROBIC Blood Culture adequate volume Performed at [facility], [address] [place], [place] [address]    Culture   Final    NO GROWTH 5 DAYS Performed at [facility], [address] [place], [place] [address]    Report Status 08/23/2023 FINAL  Final  Surgical PCR screen     Status: Abnormal   Collection Time: 08/20/23  1:15 AM   Specimen: Nasal Mucosa; Nasal Swab  Result Value Ref Range Status   MRSA, PCR NEGATIVE NEGATIVE Final   Staphylococcus aureus POSITIVE (A) NEGATIVE Final    Comment: (NOTE) The Xpert SA Assay (FDA approved for NASAL specimens in patients 70 years of age and older), is one component of a comprehensive surveillance program. It is not intended to diagnose infection nor to guide or monitor treatment. Performed at [facility], [address], [place], [place] [address]          Radiology Studies: IR  NEPHROURETERAL CATH PLACE RIGHT Result Date: 08/21/2023 INDICATION: Hydronephrosis. Distal ureteral obstruction. Failed retrograde stent placement EXAM:  Procedures; 1.  RIGHT ANTEGRADE NEPHROSTOGRAM 2. ULTRASOUND AND FLUOROSCOPIC GUIDED RIGHT NEPHROURETERAL CATHETER PLACEMENT COMPARISON:  CT AP, 08/17/2023.  Or fluoroscopy, 08/20/2023. MEDICATIONS: Levofloxacin  500 mg IV; The antibiotic was administered in an appropriate time frame prior to skin puncture. ANESTHESIA/SEDATION: Moderate (conscious) sedation was employed during this procedure. A total of Versed  4 mg and Fentanyl  200 mcg was administered intravenously. Moderate Sedation Time: 44 minutes. The patient's level of consciousness and vital signs were monitored continuously by radiology nursing throughout the procedure under my direct supervision. CONTRAST:  70 mL Isovue 300 - administered into the renal collecting system FLUOROSCOPY: Radiation Exposure Index and estimated peak skin dose (PSD); Reference air kerma (RAK), 16 mGy. COMPLICATIONS: None immediate. PROCEDURE: Informed written consent was obtained from the patient after a discussion of the risks, benefits, and alternatives to treatment. The RIGHT flank region was prepped with sterile prep in a usual fashion, and a sterile drape was applied covering the operative field. A sterile gown and sterile gloves were used for the procedure. A timeout was performed prior to the initiation of the procedure. A pre procedural spot fluoroscopic image was obtained of the upper abdomen. Ultrasound scanning performed of the kidney was negative for significant hydronephrosis. As such, the stone within inferior pole was targeted using a combination of ultrasound and fluoroscopically with a 22 gauge Chiba needle. Access to the collecting system was confirmed with advancement of a Nitrex wire into the collecting system. The needle was exchanged for the  inner 3 Fr catheter from an Accustick set and contrast injection confirmed access. An Accustick set was utilized to dilate the tract and was subsequently exchanged for a Kumpe catheter over a Bentson wire. The Kumpe catheter was advanced down the ureter and into the urinary bladder. Next, over an Amplatz wire, the track was further dilated ultimately allowing placement of a 10 Fr, 22 cm percutaneous nephroureteral catheter with end coiled and locked within the urinary bladder. Contrast was injected and several spot fluoroscopic images were obtained in various obliquities. The catheter was secured at the skin entrance site with an interrupted suture and a stat lock device and connected to a gravity bag. Dressings were applied. The patient tolerated procedure well without immediate postprocedural complication. FINDINGS: *Ultrasound scanning demonstrates a mildly dilated RIGHT collecting system. *Under a combination of ultrasound and fluoroscopic guidance, a posterior inferior calix was targeted, and access the urinary bladder obtained allowing placement of a 10 Fr, 26 cm percutaneous nephroureteral catheter with end coiled and locked within the urinary bladder. IMPRESSION: Successful placement of a RIGHT 10 Fr, 22 cm nephroureteral drainage catheter, with tip at the level of the urinary bladder. RECOMMENDATIONS: *The catheter was placed to gravity drainage, with anticipated capping trial after 24-48 hours. *The patient will return to Vascular Interventional Radiology (VIR) for routine catheter evaluation and and exchange, with possible ureteral stent conversion in 6-8 weeks. Art Largo, MD [facility] Electronically Signed   By: Art Largo M.D.   On: 08/21/2023 17:23        Scheduled Meds:  amiodarone   200 mg Oral BID   [START ON 08/29/2023] amiodarone   200 mg Oral Daily   amoxicillin -clavulanate  1 tablet Oral Q12H   buPROPion   150 mg Oral Daily    enoxaparin  (LOVENOX ) injection  60 mg Subcutaneous Q12H   feeding supplement  237 mL Oral BID BM   mupirocin  ointment  1 Application Nasal BID   pantoprazole   40 mg Oral Daily  phosphorus  500 mg Oral BID   rosuvastatin   10 mg Oral Daily   sodium chloride  flush  5 mL Intracatheter Q8H   verapamil   120 mg Oral Daily   Continuous Infusions:  magnesium  sulfate bolus IVPB       LOS: 5 days    Barbee Lew, MD 08/23/2023, 12:44 PM

## 2023-08-24 DIAGNOSIS — I4891 Unspecified atrial fibrillation: Secondary | ICD-10-CM

## 2023-08-24 DIAGNOSIS — N179 Acute kidney failure, unspecified: Secondary | ICD-10-CM | POA: Diagnosis not present

## 2023-08-24 LAB — CBC
HCT: 31 % — ABNORMAL LOW (ref 36.0–46.0)
Hemoglobin: 10.4 g/dL — ABNORMAL LOW (ref 12.0–15.0)
MCH: 31.7 pg (ref 26.0–34.0)
MCHC: 33.5 g/dL (ref 30.0–36.0)
MCV: 94.5 fL (ref 80.0–100.0)
Platelets: 327 10*3/uL (ref 150–400)
RBC: 3.28 MIL/uL — ABNORMAL LOW (ref 3.87–5.11)
RDW: 13.4 % (ref 11.5–15.5)
WBC: 7.6 10*3/uL (ref 4.0–10.5)
nRBC: 0 % (ref 0.0–0.2)

## 2023-08-24 LAB — COMPREHENSIVE METABOLIC PANEL WITH GFR
ALT: 36 U/L (ref 0–44)
AST: 35 U/L (ref 15–41)
Albumin: 2.4 g/dL — ABNORMAL LOW (ref 3.5–5.0)
Alkaline Phosphatase: 65 U/L (ref 38–126)
Anion gap: 9 (ref 5–15)
BUN: 14 mg/dL (ref 8–23)
CO2: 25 mmol/L (ref 22–32)
Calcium: 8.8 mg/dL — ABNORMAL LOW (ref 8.9–10.3)
Chloride: 105 mmol/L (ref 98–111)
Creatinine, Ser: 1.04 mg/dL — ABNORMAL HIGH (ref 0.44–1.00)
GFR, Estimated: 54 mL/min — ABNORMAL LOW (ref >60)
Glucose, Bld: 97 mg/dL (ref 70–99)
Potassium: 3 mmol/L — ABNORMAL LOW (ref 3.5–5.1)
Sodium: 139 mmol/L (ref 135–145)
Total Bilirubin: 0.6 mg/dL (ref 0.0–1.2)
Total Protein: 5.4 g/dL — ABNORMAL LOW (ref 6.5–8.1)

## 2023-08-24 MED ORDER — POTASSIUM CHLORIDE CRYS ER 20 MEQ PO TBCR
40.0000 meq | EXTENDED_RELEASE_TABLET | Freq: Once | ORAL | Status: AC
  Administered 2023-08-24: 40 meq via ORAL
  Filled 2023-08-24: qty 2

## 2023-08-24 MED ORDER — HYDRALAZINE HCL 25 MG PO TABS
25.0000 mg | ORAL_TABLET | Freq: Four times a day (QID) | ORAL | Status: DC | PRN
  Filled 2023-08-24: qty 1

## 2023-08-24 NOTE — Progress Notes (Signed)
   Progress Note  Patient Name: Laurie Bowman Date of Encounter: 08/24/2023  Primary Cardiologist: Zoe Hinds, MD  Telemetry reviewed, patient maintaining sinus rhythm.  No new recommendations, please see note from yesterday.  For questions or updates, please contact [facility] Please consult [email or web address] for contact info under   Signed, Teddie Favre, MD  08/24/2023, 8:37 AM

## 2023-08-24 NOTE — Plan of Care (Signed)
 Ambulated in hallway with PT today.  Urine from nephrostomy drain has progressed from red tinged amber to pink tinged and now clear amber.

## 2023-08-24 NOTE — Progress Notes (Signed)
 Mobility Specialist - Progress Note   08/24/23 1247  Mobility  Activity Ambulated with assistance in hallway  Level of Assistance Contact guard assist, steadying assist  Assistive Device Front wheel walker  Distance Ambulated (ft) 50 ft  Range of Motion/Exercises Active  Activity Response Tolerated well  Mobility Referral Yes  Mobility visit 1 Mobility  Mobility Specialist Start Time (ACUTE ONLY) 1235  Mobility Specialist Stop Time (ACUTE ONLY) 1247  Mobility Specialist Time Calculation (min) (ACUTE ONLY) 12 min   Pt was was in bed and agreeable to ambulate. Grew fatigued with session and at EOS returned to recliner chair with all needs met. Call bell in reach.  Lorna Rose,  Mobility Specialist Can be reached via Secure Chat

## 2023-08-24 NOTE — Progress Notes (Signed)
 PROGRESS NOTE Laurie Bowman  ZOX:[id number] DOB: January 28, 1941 DOA: 08/17/2023 PCP: Sheilah Denver., MD  Brief Narrative/Hospital Course: 83 y.o. female with history of hypertrophic cardiomyopathy, paroxysmal atrial fibrillation, hyperlipidemia, recently diagnosed bladder cancer underwent BCG treatment last week at [facility] since then has been feeling weak tired fatigued poor appetite and also has been having increasing pain on micturition.  Had subjective feeling of fever chills.  Patient felt so weak today that she slid out of the bed did not hit her head or lose consciousness.  She's been admitted with general malaise, AKI.  CT with findings possibly concerning for a UTI.  Cardiology following for afib with RVR, elevated troponin.  Urology planning for stent-attempted but unsuccessful> subsequently nephrostomy catheter placed by IR on the right  Subjective: Patient seen and examined Alert, oriented resting comfortably Left arm IV site infiltration improving, lost IV access and from the right with some infiltration. Has a difficult IV access Overnight afebrile BP remained stable in 140s, on room air  Labs reviewed this morning shows hypokalemia 3.0 creatinine stable normal LFTs CBC with stable anemia  Assessment and plan:  E coli UTI  Possible Pyelonephritis Right Sided Hydronephrosis Generalized Malaise  CT with trace gas in bladder and subtle perivesical stranding, mild right hydroureteronephrosis  Pansensitive e. Coli on urine culture. Blood culture NGTD> continue po Augmentin . Urology planned  stent-but unsuccessful as could not find the orifice> subsequently nephrostomy catheter placed by IR  Eliquis  remains on hold, monitor nephrostomy output for any bleeding Waiting for IR clearance for discharge.  Urine looks clear on nephrostomy  Hypokalemia: Replace this morning orally  Hyponatremia Hypophosphatemia Hypomagnesemia: resolved.  Normocytic anemia: Hemoglobin holding ~ 10  to 11 g range.  Continue to monitor Recent Labs  Lab 08/20/23 0415 08/21/23 0420 08/22/23 0348 08/23/23 0418 08/24/23 0351  HGB 10.7* 10.0* 10.0* 10.4* 10.4*  HCT 32.5* 30.6* 29.6* 31.8* 31.0*    A-fib with RVR  Patient had RVR prior to procedure, TSH normal amiodarone  transitioned to p.o, Eliquis  on hold currently on Lovenox  waiting for IR clearance to resume Eliquis .  Cardiology following intermittently.   AKI: likely from poor oral intake and dehydration, ?  Obstructive uropathy creatinine has improved. CT with mild R hydroureteronephrosis (possibly with recent passage of stone or infection)   Acute Metabolic Encephalopathy  Suspect due to above-with UTI dehydration.  Improved.  Continue delirium precaution fall precaution.    Elevated troponin: Seen by cardiology echo done suspect demand ischemia   Mild rhabdomyolysis  Elevated CK:  Cont holding statin.UA concerning for myoglobinuria with moderate Hb and 0-5 RBC's.  Bladder cancer S/p TURBT 06/2023, intravesicular BCG last week    Hyperlipidemia: Resime statins   Hypertension Stable continue verapamil . Holding thiazide, holding minoxidil    Hypertrophic cardiomyopathy  Caution with volume    Depression Stable on Wellbutrin .  Deconditioning/debility: Continue PT OT plan for skilled nursing.  Once cleared by  IR/urology-hopefully soon  DVT prophylaxis:  Lovenox  Code Status:   Code Status: Full Code Family Communication: plan of care discussed with patient at bedside. Patient status is: Remains hospitalized because of severity of illness Level of care: Progressive   Dispo: The patient is from: home            Anticipated disposition: TBD pending IR clearance Objective: Vitals last 24 hrs: Vitals:   08/23/23 0443 08/23/23 1257 08/23/23 2039 08/24/23 0444  BP: 138/71 (!) 153/76 (!) 159/69 (!) 148/69  Pulse: (!) 58 62 66 (!) 59  Resp: 14 18 19 16   Temp: 98.4 F (36.9 C) (!) 97.5 F (36.4 C) 98.4 F (36.9  C) 98.3 F (36.8 C)  TempSrc: Oral Oral Oral Oral  SpO2: 96% 97% 99% 97%  Weight:      Height:        Physical Examination: General exam: alert awake, oriented at baseline, older than stated age HEENT:Oral mucosa moist, Ear/Nose WNL grossly Respiratory system: Bilaterally clear BS,no use of accessory muscle Cardiovascular system: S1 & S2 +, No JVD. Gastrointestinal system: Abdomen soft,NT,ND, BS+ Nervous System: Alert, awake, moving all extremities,and following commands.   Extremities: LE edema neg,distal peripheral pulses palpable and warm.  Skin: No rashes,no icterus. MSK: Normal muscle bulk,tone, power Nephrostomy tube in place with pinkish urine  Data Reviewed: I have personally reviewed following labs and imaging studies ( see epic result tab) CBC: Recent Labs  Lab 08/18/23 0034 08/19/23 0358 08/20/23 0415 08/21/23 0420 08/22/23 0348 08/23/23 0418 08/24/23 0351  WBC 9.6 5.4 8.6 8.1 7.6 7.2 7.6  NEUTROABS 8.0* 4.3 7.1  --   --   --   --   HGB 11.6* 11.0* 10.7* 10.0* 10.0* 10.4* 10.4*  HCT 35.2* 33.5* 32.5* 30.6* 29.6* 31.8* 31.0*  MCV 97.5 97.7 96.7 95.9 93.7 95.8 94.5  PLT 158 148* 153 162 203 277 327   CMP: Recent Labs  Lab 08/19/23 0358 08/20/23 0415 08/21/23 0419 08/21/23 0420 08/22/23 0348 08/23/23 0418 08/24/23 0351  NA 130* 130*  --  131* 133* 136 139  K 3.3* 3.2*  --  3.0* 3.0* 3.6 3.0*  CL 100 100  --  101 103 104 105  CO2 22 21*  --  22 22 25 25   GLUCOSE 97 110*  --  85 104* 93 97  BUN 23 17  --  15 18 15 14   CREATININE 1.18* 1.18*  --  0.99 1.04* 1.16* 1.04*  CALCIUM  8.7* 8.2*  --  8.2* 8.4* 8.7* 8.8*  MG 1.9 1.6* 1.8  --  1.8  --   --   PHOS 3.2 2.0* 2.6  --   --   --   --    GFR: Estimated Creatinine Clearance: 35 mL/min (A) (by C-G formula based on SCr of 1.04 mg/dL (H)). Recent Labs  Lab 08/17/23 1612 08/18/23 0034 08/19/23 0358 08/20/23 0415 08/24/23 0351  AST 36 60* 78* 71* 35  ALT 23 26 38 46* 36  ALKPHOS 55 47 44 52 65   BILITOT 2.5* 1.8* 1.6* 1.5* 0.6  PROT 7.0 5.9* 5.5* 5.4* 5.4*  ALBUMIN 3.6 2.9* 2.7* 2.5* 2.4*    Recent Labs  Lab 08/17/23 1612  LIPASE 21   No results for input(s): "AMMONIA" in the last 168 hours. Coagulation Profile:  Recent Labs  Lab 08/21/23 1021  INR 1.5*   Unresulted Labs (From admission, onward)     Start     Ordered   08/26/23 0500  Basic metabolic panel with GFR  Every 48 hours,   R     Question:  Specimen collection method  Answer:  Lab=Lab collect   08/24/23 1052   08/26/23 0500  CBC  Every 48 hours,   R     Question:  Specimen collection method  Answer:  Lab=Lab collect   08/24/23 1052   08/21/23 1500  Urine Culture (for pregnant, neutropenic or urologic patients or patients with an indwelling urinary catheter)  (Urine Culture)  Once,   R       Comments: R  PCN access   Question:  Indication  Answer:  Flank Pain   08/21/23 1500           Antimicrobials/Microbiology: Anti-infectives (From admission, onward)    Start     Dose/Rate Route Frequency Ordered Stop   08/21/23 1400  levofloxacin  (LEVAQUIN ) IVPB 500 mg        500 mg 100 mL/hr over 60 Minutes Intravenous  Once 08/21/23 1021 08/21/23 1620   08/20/23 1600  ceFAZolin  (ANCEF ) IVPB 2g/100 mL premix        2 g 200 mL/hr over 30 Minutes Intravenous  Once 08/20/23 1558 08/20/23 1630   08/20/23 1559  ceFAZolin  (ANCEF ) 2-4 GM/100ML-% IVPB       Note to Pharmacy: Gaines, Janel R: cabinet override      08/20/23 1559 08/20/23 1623   08/19/23 2200  amoxicillin -clavulanate (AUGMENTIN ) 875-125 MG per tablet 1 tablet       Note to Pharmacy: Retime as appropriate based on last levaquin  dose   1 tablet Oral Every 12 hours 08/19/23 1604     08/19/23 1800  levofloxacin  (LEVAQUIN ) IVPB 750 mg  Status:  Discontinued        750 mg 100 mL/hr over 90 Minutes Intravenous Every 48 hours 08/17/23 2305 08/18/23 0310   08/19/23 1800  levofloxacin  (LEVAQUIN ) IVPB 500 mg  Status:  Discontinued        500 mg 100 mL/hr over  60 Minutes Intravenous Every 48 hours 08/18/23 0310 08/19/23 1604   08/19/23 0730  ceFAZolin  (ANCEF ) IVPB 2g/100 mL premix  Status:  Discontinued        2 g 200 mL/hr over 30 Minutes Intravenous  Once 08/19/23 0724 08/19/23 1300   08/19/23 0704  ceFAZolin  (ANCEF ) 2-4 GM/100ML-% IVPB  Status:  Discontinued       Note to Pharmacy: [name] W: cabinet override      08/19/23 0704 08/19/23 1241   08/17/23 1815  levofloxacin  (LEVAQUIN ) IVPB 500 mg        500 mg 100 mL/hr over 60 Minutes Intravenous  Once 08/17/23 1808 08/17/23 1919         Component Value Date/Time   SDES  08/18/2023 1241    BLOOD BLOOD RIGHT ARM Performed at [facility], [address] [place], [place] [address]    SPECREQUEST  08/18/2023 1241    BOTTLES DRAWN AEROBIC AND ANAEROBIC Blood Culture adequate volume Performed at [facility], [address], [place], [place] [address]    CULT  08/18/2023 1241    NO GROWTH 5 DAYS Performed at [facility], [address], [place], [place] [address]    REPTSTATUS 08/23/2023 FINAL 08/18/2023 1241    Procedures: Procedure(s) (LRB): CYSTOSCOPY, WITH RETROGRADE PYELOGRAM AND URETERAL STENT INSERTION (Right) Medications reviewed:  Scheduled Meds:  amiodarone   200 mg Oral BID   [START ON 08/29/2023] amiodarone   200 mg Oral Daily   amoxicillin -clavulanate  1 tablet Oral Q12H   buPROPion   150 mg Oral Daily   enoxaparin  (LOVENOX ) injection  60 mg Subcutaneous Q12H   feeding supplement  237 mL Oral BID BM   pantoprazole   40 mg Oral Daily   phosphorus  500 mg Oral BID   rosuvastatin   10 mg Oral Daily   sodium chloride  flush  5 mL Intracatheter Q8H   verapamil   120 mg Oral Daily   Continuous Infusions:  magnesium  sulfate bolus IVPB      Gwynneth Fabio, MD [facility] 08/24/2023, 10:52  AM

## 2023-08-25 ENCOUNTER — Inpatient Hospital Stay (HOSPITAL_COMMUNITY)

## 2023-08-25 DIAGNOSIS — N179 Acute kidney failure, unspecified: Secondary | ICD-10-CM | POA: Diagnosis not present

## 2023-08-25 LAB — COMPREHENSIVE METABOLIC PANEL WITH GFR
ALT: 35 U/L (ref 0–44)
AST: 35 U/L (ref 15–41)
Albumin: 2.5 g/dL — ABNORMAL LOW (ref 3.5–5.0)
Alkaline Phosphatase: 68 U/L (ref 38–126)
Anion gap: 9 (ref 5–15)
BUN: 14 mg/dL (ref 8–23)
CO2: 27 mmol/L (ref 22–32)
Calcium: 8.9 mg/dL (ref 8.9–10.3)
Chloride: 102 mmol/L (ref 98–111)
Creatinine, Ser: 1.11 mg/dL — ABNORMAL HIGH (ref 0.44–1.00)
GFR, Estimated: 50 mL/min — ABNORMAL LOW (ref >60)
Glucose, Bld: 89 mg/dL (ref 70–99)
Potassium: 3.5 mmol/L (ref 3.5–5.1)
Sodium: 138 mmol/L (ref 135–145)
Total Bilirubin: 0.4 mg/dL (ref 0.0–1.2)
Total Protein: 5.6 g/dL — ABNORMAL LOW (ref 6.5–8.1)

## 2023-08-25 LAB — CREATININE, SERUM
Creatinine, Ser: 1.06 mg/dL — ABNORMAL HIGH (ref 0.44–1.00)
GFR, Estimated: 53 mL/min — ABNORMAL LOW (ref >60)

## 2023-08-25 MED ORDER — APIXABAN 5 MG PO TABS
5.0000 mg | ORAL_TABLET | Freq: Two times a day (BID) | ORAL | Status: DC
  Administered 2023-08-25 – 2023-08-28 (×6): 5 mg via ORAL
  Filled 2023-08-25 (×6): qty 1

## 2023-08-25 MED ORDER — DICYCLOMINE HCL 10 MG PO CAPS
10.0000 mg | ORAL_CAPSULE | Freq: Three times a day (TID) | ORAL | Status: DC | PRN
  Administered 2023-08-26 – 2023-08-27 (×3): 10 mg via ORAL
  Filled 2023-08-25 (×3): qty 1

## 2023-08-25 MED ORDER — IOHEXOL 9 MG/ML PO SOLN
500.0000 mL | ORAL | Status: AC
  Administered 2023-08-25 (×2): 500 mL via ORAL

## 2023-08-25 MED ORDER — IOHEXOL 9 MG/ML PO SOLN
ORAL | Status: AC
  Filled 2023-08-25: qty 1000

## 2023-08-25 NOTE — Progress Notes (Signed)
 PROGRESS NOTE Laurie Bowman  ZOX:[id number] DOB: [birth year] DOA: 08/17/2023 PCP: Sheilah Denver., MD  Brief Narrative/Hospital Course: 83 y.o. female with history of hypertrophic cardiomyopathy, paroxysmal atrial fibrillation, hyperlipidemia, recently diagnosed bladder cancer underwent BCG treatment last week at [facility] since then has been feeling weak tired fatigued poor appetite and also has been having increasing pain on micturition.  Had subjective feeling of fever chills.  Patient felt so weak today that she slid out of the bed did not hit her head or lose consciousness.  She's been admitted with general malaise, AKI.  CT with findings possibly concerning for a UTI.  Cardiology following for afib with RVR, elevated troponin.  Urology planning for stent-attempted but unsuccessful> subsequently nephrostomy catheter placed by IR on the right IR has cleared her for discharge >No additional VIR intervention during current hospitalization. She will return for routine exchange, possible internalization in 6-8wks.   Subjective: Seen and examined She is alert awake resting, had some abdominal discomfort.  No more diarrhea Overnight afebrile BP stable creatinine 1.0 this morning   Assessment and plan:  E coli UTI  Possible Pyelonephritis Right Sided Hydronephrosis Generalized Malaise  CT with trace gas in bladder and subtle perivesical stranding, mild right hydroureteronephrosis  Pansensitive e. Coli on urine culture. Blood culture NGTD> continue po Augmentin . Urology planned  stent-but unsuccessful as could not find the orifice> subsequently nephrostomy catheter placed by IR  Eliquis  remains on hold,> on Lovenox  which will be transition to Eliquis  after discussion with IR 6/9.  Okay for discharge plan to follow-up with IR in 6 to 8 weeks for possible internalization   Hypokalemia: Replaced  Hyponatremia Hypophosphatemia Hypomagnesemia: resolved.  Normocytic anemia: Hemoglobin  holding ~ 10 to 11 g range.  Continue to monitor Recent Labs  Lab 08/20/23 0415 08/21/23 0420 08/22/23 0348 08/23/23 0418 08/24/23 0351  HGB 10.7* 10.0* 10.0* 10.4* 10.4*  HCT 32.5* 30.6* 29.6* 31.8* 31.0*    A-fib with RVR  Patient had RVR prior to procedure, TSH normal amiodarone  transitioned to p.o, Eliquis  on hold currently on Lovenox  waiting for IR clearance to resume Eliquis .  Cardiology following intermittently.   AKI: likely from poor oral intake and dehydration, ?  Obstructive uropathy creatinine has improved. CT with mild R hydroureteronephrosis (possibly with recent passage of stone or infection)   Acute Metabolic Encephalopathy  Suspect due to above-with UTI dehydration.  Improved.  Continue delirium precaution fall precaution.    Elevated troponin: Seen by cardiology echo done suspect demand ischemia   Mild rhabdomyolysis  Elevated CK:  Cont holding statin.UA concerning for myoglobinuria with moderate Hb and 0-5 RBC's.  Bladder cancer S/p TURBT 06/2023, intravesicular BCG last week    Hyperlipidemia: Resime statins   Hypertension Stable continue verapamil . Holding thiazide, holding minoxidil    Hypertrophic cardiomyopathy  Caution with volume    Depression Stable on Wellbutrin .  Deconditioning/debility: Continue PT OT plan for skilled nursing. Toc FOLLOWING-discussed with TOC they will start insurance Auth  DVT prophylaxis:  Lovenox  Code Status:   Code Status: Full Code Family Communication: plan of care discussed with patient at bedside. Patient status is: Remains hospitalized because of severity of illness Level of care: Progressive   Dispo: The patient is from: home            Anticipated disposition: SNF pending  Objective: Vitals last 24 hrs: Vitals:   08/24/23 1511 08/24/23 2052 08/24/23 2120 08/25/23 0414  BP: (!) 150/80 (!) 167/81 (!) 160/67 (!) 148/73  Pulse:  66 64 61  Resp:      Temp:  98.4 F (36.9 C)  98 F (36.7 C)  TempSrc:   Oral  Oral  SpO2:  99%  96%  Weight:      Height:        Physical Examination: General exam: alert awake, oriented at baseline, older than stated age HEENT:Oral mucosa moist, Ear/Nose WNL grossly Respiratory system: Bilaterally clear BS,no use of accessory muscle Cardiovascular system: S1 & S2 +, No JVD. Gastrointestinal system: Abdomen soft, mildly tender abdomen,ND, BS+ Nervous System: Alert, awake, moving all extremities,and following commands. Extremities: LE edema neg,distal peripheral pulses palpable and warm.  Skin: No rashes,no icterus. MSK: Normal muscle bulk,tone, power   Data Reviewed: I have personally reviewed following labs and imaging studies ( see epic result tab) CBC: Recent Labs  Lab 08/19/23 0358 08/20/23 0415 08/21/23 0420 08/22/23 0348 08/23/23 0418 08/24/23 0351  WBC 5.4 8.6 8.1 7.6 7.2 7.6  NEUTROABS 4.3 7.1  --   --   --   --   HGB 11.0* 10.7* 10.0* 10.0* 10.4* 10.4*  HCT 33.5* 32.5* 30.6* 29.6* 31.8* 31.0*  MCV 97.7 96.7 95.9 93.7 95.8 94.5  PLT 148* 153 162 203 277 327   CMP: Recent Labs  Lab 08/19/23 0358 08/20/23 0415 08/21/23 0419 08/21/23 0420 08/22/23 0348 08/23/23 0418 08/24/23 0351 08/25/23 0411  NA 130* 130*  --  131* 133* 136 139  --   K 3.3* 3.2*  --  3.0* 3.0* 3.6 3.0*  --   CL 100 100  --  101 103 104 105  --   CO2 22 21*  --  22 22 25 25   --   GLUCOSE 97 110*  --  85 104* 93 97  --   BUN 23 17  --  15 18 15 14   --   CREATININE 1.18* 1.18*  --  0.99 1.04* 1.16* 1.04* 1.06*  CALCIUM  8.7* 8.2*  --  8.2* 8.4* 8.7* 8.8*  --   MG 1.9 1.6* 1.8  --  1.8  --   --   --   PHOS 3.2 2.0* 2.6  --   --   --   --   --    GFR: Estimated Creatinine Clearance: 34.4 mL/min (A) (by C-G formula based on SCr of 1.06 mg/dL (H)). Recent Labs  Lab 08/19/23 0358 08/20/23 0415 08/24/23 0351  AST 78* 71* 35  ALT 38 46* 36  ALKPHOS 44 52 65  BILITOT 1.6* 1.5* 0.6  PROT 5.5* 5.4* 5.4*  ALBUMIN 2.7* 2.5* 2.4*    No results for input(s):  "LIPASE", "AMYLASE" in the last 168 hours.  No results for input(s): "AMMONIA" in the last 168 hours. Coagulation Profile:  Recent Labs  Lab 08/21/23 1021  INR 1.5*   Unresulted Labs (From admission, onward)     Start     Ordered   08/26/23 0500  Basic metabolic panel with GFR  Every 48 hours,   R     Question:  Specimen collection method  Answer:  Lab=Lab collect   08/24/23 1052   08/26/23 0500  CBC  Every 48 hours,   R     Question:  Specimen collection method  Answer:  Lab=Lab collect   08/24/23 1052   08/21/23 1500  Urine Culture (for pregnant, neutropenic or urologic patients or patients with an indwelling urinary catheter)  (Urine Culture)  Once,   R       Comments: R PCN  access   Question:  Indication  Answer:  Flank Pain   08/21/23 1500           Antimicrobials/Microbiology: Anti-infectives (From admission, onward)    Start     Dose/Rate Route Frequency Ordered Stop   08/21/23 1400  levofloxacin  (LEVAQUIN ) IVPB 500 mg        500 mg 100 mL/hr over 60 Minutes Intravenous  Once 08/21/23 1021 08/21/23 1620   08/20/23 1600  ceFAZolin  (ANCEF ) IVPB 2g/100 mL premix        2 g 200 mL/hr over 30 Minutes Intravenous  Once 08/20/23 1558 08/20/23 1630   08/20/23 1559  ceFAZolin  (ANCEF ) 2-4 GM/100ML-% IVPB       Note to Pharmacy: Gaines, Janel R: cabinet override      08/20/23 1559 08/20/23 1623   08/19/23 2200  amoxicillin -clavulanate (AUGMENTIN ) 875-125 MG per tablet 1 tablet       Note to Pharmacy: Retime as appropriate based on last levaquin  dose   1 tablet Oral Every 12 hours 08/19/23 1604     08/19/23 1800  levofloxacin  (LEVAQUIN ) IVPB 750 mg  Status:  Discontinued        750 mg 100 mL/hr over 90 Minutes Intravenous Every 48 hours 08/17/23 2305 08/18/23 0310   08/19/23 1800  levofloxacin  (LEVAQUIN ) IVPB 500 mg  Status:  Discontinued        500 mg 100 mL/hr over 60 Minutes Intravenous Every 48 hours 08/18/23 0310 08/19/23 1604   08/19/23 0730  ceFAZolin  (ANCEF ) IVPB  2g/100 mL premix  Status:  Discontinued        2 g 200 mL/hr over 30 Minutes Intravenous  Once 08/19/23 0724 08/19/23 1300   08/19/23 0704  ceFAZolin  (ANCEF ) 2-4 GM/100ML-% IVPB  Status:  Discontinued       Note to Pharmacy: [name] W: cabinet override      08/19/23 0704 08/19/23 1241   08/17/23 1815  levofloxacin  (LEVAQUIN ) IVPB 500 mg        500 mg 100 mL/hr over 60 Minutes Intravenous  Once 08/17/23 1808 08/17/23 1919         Component Value Date/Time   SDES  08/18/2023 1241    BLOOD BLOOD RIGHT ARM Performed at [facility], [address] [place], [place] [address]    SPECREQUEST  08/18/2023 1241    BOTTLES DRAWN AEROBIC AND ANAEROBIC Blood Culture adequate volume Performed at [facility], [address], [place], [place] [address]    CULT  08/18/2023 1241    NO GROWTH 5 DAYS Performed at [facility], [address] [place], [place] [address]    REPTSTATUS 08/23/2023 FINAL 08/18/2023 1241    Procedures: Procedure(s) (LRB): CYSTOSCOPY, WITH RETROGRADE PYELOGRAM AND URETERAL STENT INSERTION (Right) Medications reviewed:  Scheduled Meds:  amiodarone   200 mg Oral BID   [START ON 08/29/2023] amiodarone   200 mg Oral Daily   amoxicillin -clavulanate  1 tablet Oral Q12H   buPROPion   150 mg Oral Daily   enoxaparin  (LOVENOX ) injection  60 mg Subcutaneous Q12H   feeding supplement  237 mL Oral BID BM   pantoprazole   40 mg Oral Daily   phosphorus  500 mg Oral BID   rosuvastatin   10 mg Oral Daily   sodium chloride  flush  5 mL Intracatheter Q8H   verapamil   120 mg Oral Daily   Continuous Infusions:  magnesium  sulfate bolus IVPB      Cabrina Shiroma, MD [facility] 08/25/2023, 10:40 AM

## 2023-08-25 NOTE — Progress Notes (Signed)
 Referring Physician(s): Herrick,B  Supervising Physician: Elene Griffes  Patient Status:  [facility] - In-pt  Chief Complaint: Obstructed right kidney   Subjective: Sitting up in chair.  Husband at bedside.  Does not remember voiding, but 700 mL documented overnight.  Answered all questions prior to patient leaving for CT imaging.   Allergies: Cefaclor, Codeine, Fructose, Sulfa antibiotics, Meperidine, Elemental sulfur, and Propoxyphene  Medications: Prior to Admission medications   Medication Sig Start Date End Date Taking? Authorizing Provider  acetaminophen  (TYLENOL ) 500 MG tablet Take 500 mg by mouth every 4 (four) hours as needed for moderate pain (pain score 4-6). 09/18/20  Yes [provider]  albuterol  (PROVENTIL ) (2.5 MG/3ML) 0.083% nebulizer solution Take 3 mLs (2.5 mg total) by nebulization every 6 (six) hours as needed for wheezing or shortness of breath. 06/18/21  Yes Rochester Chuck, MD  albuterol  (VENTOLIN  HFA) 108 [phone] Base) MCG/ACT inhaler USE 1-2 PUFFS EVERY 4-6 HOURS AS NEEDED FOR COUGH OR WHEEZE. 07/02/22  Yes Rochester Chuck, MD  apixaban  (ELIQUIS ) 5 MG TABS tablet Take 1 tablet (5 mg total) by mouth 2 (two) times daily. 06/19/23  Yes Tammie Fall, MD  augmented betamethasone dipropionate (DIPROLENE-AF) 0.05 % cream Apply 0.05 application  topically daily as needed (Lichen).   Yes [provider]  benzonatate  (TESSALON ) 100 MG capsule Take 100 mg by mouth daily as needed for cough. 07/02/18  Yes [provider]  buPROPion  (WELLBUTRIN  XL) 150 MG 24 hr tablet Take 150 mg by mouth daily. 12/02/21  Yes [provider]  Cholecalciferol  (VITAMIN D) 50 MCG (2000 UT) CAPS Take 2,000 Units by mouth daily.   Yes [provider]  clobetasol cream (TEMOVATE) 0.05 % Apply 1 application  topically daily as needed (Scratches). 03/22/18  Yes [provider]  clotrimazole-betamethasone (LOTRISONE) cream Apply 1 application   topically daily as needed (Fugus). 03/16/19  Yes [provider]  diclofenac Sodium (VOLTAREN) 1 % GEL Apply 2 g topically 4 (four) times daily as needed (Pain). Both knees 04/16/19  Yes [provider]  dicyclomine  (BENTYL ) 20 MG tablet Take 20 mg by mouth daily as needed for spasms. 04/14/18  Yes [provider]  fexofenadine (ALLEGRA) 180 MG tablet Take 180 mg by mouth daily.    Yes [provider]  fluticasone  (FLOVENT  HFA) 110 MCG/ACT inhaler Use two puffs twice daily for one to two weeks during flares Patient taking differently: Inhale 2 puffs into the lungs daily as needed (Asthma). 06/26/23  Yes Rochester Chuck, MD  hydrochlorothiazide  (HYDRODIURIL ) 25 MG tablet Take 1 tablet (25 mg total) by mouth daily. 09/04/21 08/18/23 Yes Arrien, Curlee Doss, MD  lansoprazole  (PREVACID ) 30 MG capsule Take 1 capsule (30 mg total) by mouth 2 (two) times daily before a meal. 07/10/21  Yes Rochester Chuck, MD  minoxidil (LONITEN) 2.5 MG tablet Take 1.25 mg by mouth daily. 07/16/23  Yes [provider]  mometasone  (NASONEX ) 50 MCG/ACT nasal spray Place 2 sprays into the nose daily. Patient taking differently: Place 2 sprays into the nose daily as needed (Rhinitis). 06/26/23  Yes Rochester Chuck, MD  montelukast  (SINGULAIR ) 10 MG tablet Take 1 tablet (10 mg total) by mouth at bedtime. 01/09/23  Yes Rochester Chuck, MD  olopatadine  (PATANOL) 0.1 % ophthalmic solution Place 1 drop into both eyes 2 (two) times daily as needed. 04/18/20  Yes Rochester Chuck, MD  Polyethylene Glycol 3350  (PEG 3350 ) POWD Take 17 g by mouth  daily.  06/24/16  Yes [provider]  potassium chloride  (KLOR-CON ) 10 MEQ tablet Take 10-20 mEq by mouth See admin instructions. Take 10 mEq (1 tablet) by mouth every morning and 20 mEq (2 tablets) every night. 04/21/15  Yes [provider]  rosuvastatin  (CRESTOR ) 10 MG tablet Take 10 mg by mouth daily. 07/13/21  Yes  [provider]  tiZANidine (ZANAFLEX) 4 MG tablet Take 2 mg by mouth at bedtime as needed (Sleep). 11/16/19  Yes [provider]  triamcinolone cream (KENALOG) 0.1 % Apply 1 application  topically 2 (two) times daily as needed (Eyes). 12/14/18  Yes [provider]  verapamil  (CALAN -SR) 180 MG CR tablet Take 180 mg by mouth daily. 08/06/23  Yes [provider]     Vital Signs: BP (!) 143/67   Pulse (!) 58   Temp 97.8 F (36.6 C) (Oral)   Resp 18   Ht 5' (1.524 m)   Wt 138 lb (62.6 kg)   LMP  (LMP Unknown)   SpO2 98%   BMI 26.95 kg/m   Physical Exam Vitals and nursing note reviewed.  Constitutional:      General: She is not in acute distress.    Appearance: Normal appearance. She is not ill-appearing.  Neurological:     Mental Status: She is alert.   Skin: R PCN intact, insertion site intact.  Dressing clean and dry. Clear, yellow urine in gravity bag.   Imaging: IR  NEPHROURETERAL CATH PLACE RIGHT Result Date: 08/21/2023 INDICATION: Hydronephrosis. Distal ureteral obstruction. Failed retrograde stent placement EXAM: Procedures; 1.  RIGHT ANTEGRADE NEPHROSTOGRAM 2. ULTRASOUND AND FLUOROSCOPIC GUIDED RIGHT NEPHROURETERAL CATHETER PLACEMENT COMPARISON:  CT AP, 08/17/2023.  Or fluoroscopy, 08/20/2023. MEDICATIONS: Levofloxacin  500 mg IV; The antibiotic was administered in an appropriate time frame prior to skin puncture. ANESTHESIA/SEDATION: Moderate (conscious) sedation was employed during this procedure. A total of Versed  4 mg and Fentanyl  200 mcg was administered intravenously. Moderate Sedation Time: 44 minutes. The patient's level of consciousness and vital signs were monitored continuously by radiology nursing throughout the procedure under my direct supervision. CONTRAST:  70 mL Isovue 300 - administered into the renal collecting system FLUOROSCOPY: Radiation Exposure Index and estimated peak skin dose (PSD); Reference air kerma (RAK), 16 mGy.  COMPLICATIONS: None immediate. PROCEDURE: Informed written consent was obtained from the patient after a discussion of the risks, benefits, and alternatives to treatment. The RIGHT flank region was prepped with sterile prep in a usual fashion, and a sterile drape was applied covering the operative field. A sterile gown and sterile gloves were used for the procedure. A timeout was performed prior to the initiation of the procedure. A pre procedural spot fluoroscopic image was obtained of the upper abdomen. Ultrasound scanning performed of the kidney was negative for significant hydronephrosis. As such, the stone within inferior pole was targeted using a combination of ultrasound and fluoroscopically with a 22 gauge Chiba needle. Access to the collecting system was confirmed with advancement of a Nitrex wire into the collecting system. The needle was exchanged for the inner 3 Fr catheter from an Accustick set and contrast injection confirmed access. An Accustick set was utilized to dilate the tract and was subsequently exchanged for a Kumpe catheter over a Bentson wire. The Kumpe catheter was advanced down the ureter and into the urinary bladder. Next, over an Amplatz wire, the track was further dilated ultimately allowing placement of a 10 Fr, 22 cm percutaneous nephroureteral catheter with end coiled and locked within  the urinary bladder. Contrast was injected and several spot fluoroscopic images were obtained in various obliquities. The catheter was secured at the skin entrance site with an interrupted suture and a stat lock device and connected to a gravity bag. Dressings were applied. The patient tolerated procedure well without immediate postprocedural complication. FINDINGS: *Ultrasound scanning demonstrates a mildly dilated RIGHT collecting system. *Under a combination of ultrasound and fluoroscopic guidance, a posterior inferior calix was targeted, and access the urinary bladder obtained allowing placement of  a 10 Fr, 26 cm percutaneous nephroureteral catheter with end coiled and locked within the urinary bladder. IMPRESSION: Successful placement of a RIGHT 10 Fr, 22 cm nephroureteral drainage catheter, with tip at the level of the urinary bladder. RECOMMENDATIONS: *The catheter was placed to gravity drainage, with anticipated capping trial after 24-48 hours. *The patient will return to Vascular Interventional Radiology (VIR) for routine catheter evaluation and and exchange, with possible ureteral stent conversion in 6-8 weeks. Art Largo, MD [facility] Electronically Signed   By: Art Largo M.D.   On: 08/21/2023 17:23    Labs:  CBC: Recent Labs    08/21/23 0420 08/22/23 0348 08/23/23 0418 08/24/23 0351  WBC 8.1 7.6 7.2 7.6  HGB 10.0* 10.0* 10.4* 10.4*  HCT 30.6* 29.6* 31.8* 31.0*  PLT 162 203 277 327    COAGS: Recent Labs    08/20/23 1808 08/21/23 1021  INR  --  1.5*  APTT 41*  --     BMP: Recent Labs    08/21/23 0420 08/22/23 0348 08/23/23 0418 08/24/23 0351 08/25/23 0411  NA 131* 133* 136 139  --   K 3.0* 3.0* 3.6 3.0*  --   CL 101 103 104 105  --   CO2 22 22 25 25   --   GLUCOSE 85 104* 93 97  --   BUN 15 18 15 14   --   CALCIUM  8.2* 8.4* 8.7* 8.8*  --   CREATININE 0.99 1.04* 1.16* 1.04* 1.06*  GFRNONAA 57* 54* 47* 54* 53*    LIVER FUNCTION TESTS: Recent Labs    08/18/23 0034 08/19/23 0358 08/20/23 0415 08/24/23 0351  BILITOT 1.8* 1.6* 1.5* 0.6  AST 60* 78* 71* 35  ALT 26 38 46* 36  ALKPHOS 47 44 52 65  PROT 5.9* 5.5* 5.4* 5.4*  ALBUMIN 2.9* 2.7* 2.5* 2.4*    Assessment and Plan: Right kidney obstruction  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 Patient nearing discharge.  Abdominal pain reported today and CT planned.  NU  catheter left to gravity drainage today.  Anticipate return visit with IR in 6-8 weeks for internalization or per Urology.  Follow-up appt has been requested.  Schedulers will reach out to family to arrange.  No need for flushes at present as urine is clear, thin.  Routine site care as needed to keep clean and dry.     Electronically Signed: Alithea Lapage Sue-Ellen Thaxton Pelley, PA 08/25/2023, 3:17 PM   I spent a total of 15 Minutes at the the patient's bedside AND on the patient's hospital floor or unit, greater than 50% of which was counseling/coordinating care for right kidney obstruction, bladder cancer.

## 2023-08-25 NOTE — Progress Notes (Signed)
 Telemetry reviewed and maintaining normal sinus rhythm.  Please refer to note from 08/23/2026 for cardiac meds on discharge.  No new recs at this time.  Will sign off.  Will get follow-up with Dr. Sandee Crook outpatient in 2 weeks

## 2023-08-25 NOTE — Progress Notes (Addendum)
 Physical Therapy Treatment Patient Details Name: Laurie Bowman MRN: [id number] DOB: 1940/09/08 Today's Date: 08/25/2023   History of Present Illness 83 yo female admitted with ARF, UTI, hyponatremia, delirium. Pt s/p right nephroureteral catheter placement 6/5 Hx of L TKA, bladder Ca-undergoing tx, cardiomyopathy, lichen slerosus, osteoporosis, breast Ca, IBS, CKD, asthma, rhabdo, falls    PT Comments  Pt agreeable to working with therapy. Pt c/o dizziness, nausea, and lower abdominal pain on today-RN made aware. Pt releasing quite a bit of gas during session (belching). Pt open to ambulating a short distance on today. Will continue to progress activity as tolerated.     If plan is discharge home, recommend the following: Assistance with cooking/housework;Assist for transportation;Help with stairs or ramp for entrance;A little help with walking and/or transfers;A little help with bathing/dressing/bathroom   Can travel by private vehicle        Equipment Recommendations  None recommended by PT    Recommendations for Other Services       Precautions / Restrictions Precautions Precautions: Fall Precaution/Restrictions Comments: R PCN drain Restrictions Weight Bearing Restrictions Per Provider Order: No     Mobility  Bed Mobility Overal bed mobility: Needs Assistance Bed Mobility: Supine to Sit     Supine to sit: Min assist, HOB elevated, Used rails     General bed mobility comments: increased time, assist for trunk. pt c/o dizziness, nausea    Transfers Overall transfer level: Needs assistance Equipment used: Rolling walker (2 wheels) Transfers: Sit to/from Stand Sit to Stand: Contact guard assist   Step pivot transfers: Contact guard assist       General transfer comment: cues for hand placement    Ambulation/Gait Ambulation/Gait assistance: Contact guard assist Gait Distance (Feet): 40 Feet Assistive device: Rolling walker (2 wheels) Gait Pattern/deviations:  Step-through pattern, Decreased stride length       General Gait Details: used RW for safety. slow gait speed. still experiencing nausea so distance limited. fatigues quickly   Stairs             Wheelchair Mobility     Tilt Bed    Modified Rankin (Stroke Patients Only)       Balance Overall balance assessment: Needs assistance         Standing balance support: Bilateral upper extremity supported, Reliant on assistive device for balance Standing balance-Leahy Scale: Poor                              Communication Communication Communication: No apparent difficulties  Cognition Arousal: Alert Behavior During Therapy: WFL for tasks assessed/performed   PT - Cognitive impairments: No apparent impairments                         Following commands: Intact      Cueing Cueing Techniques: Verbal cues  Exercises      General Comments        Pertinent Vitals/Pain Pain Assessment Pain Assessment: Faces Faces Pain Scale: Hurts little more Pain Location: lower abdomen Pain Descriptors / Indicators: Tender, Sore, Aching, Grimacing Pain Intervention(s): Limited activity within patient's tolerance, Monitored during session, Repositioned    Home Living                          Prior Function            PT Goals (current goals can now be found  in the care plan section) Progress towards PT goals: Progressing toward goals    Frequency    Min 3X/week      PT Plan      Co-evaluation              AM-PAC PT "6 Clicks" Mobility   Outcome Measure  Help needed turning from your back to your side while in a flat bed without using bedrails?: A Little Help needed moving from lying on your back to sitting on the side of a flat bed without using bedrails?: A Little Help needed moving to and from a bed to a chair (including a wheelchair)?: A Little Help needed standing up from a chair using your arms (e.g., wheelchair or  bedside chair)?: A Little Help needed to walk in hospital room?: A Little Help needed climbing 3-5 steps with a railing? : A Lot 6 Click Score: 17    End of Session Equipment Utilized During Treatment: Gait belt Activity Tolerance: Patient limited by fatigue Patient left: in chair;with call bell/phone within reach;with family/visitor present;with nursing/sitter in room   PT Visit Diagnosis: Muscle weakness (generalized) (M62.81);History of falling (Z91.81);Difficulty in walking, not elsewhere classified (R26.2)     Time: 8413-2440 PT Time Calculation (min) (ACUTE ONLY): 34 min  Charges:    $Gait Training: 8-22 mins $Therapeutic Activity: 8-22 mins PT General Charges $$ ACUTE PT VISIT: 1 Visit                        Tanda Falter, PT Acute Rehabilitation  Office: [phone]

## 2023-08-25 NOTE — Progress Notes (Signed)
 Occupational Therapy Treatment Patient Details Name: Laurie Bowman MRN: [id number] DOB: 25-Nov-1940 Today's Date: 08/25/2023   History of present illness 83 yo female admitted with ARF, UTI, hyponatremia, delirium. Pt s/p right nephroureteral catheter placement 6/5 Hx of L TKA, bladder Ca-undergoing tx, cardiomyopathy, lichen slerosus, osteoporosis, breast Ca, IBS, CKD, asthma, rhabdo, falls   OT comments  Patient was educated on importance of increasing use of bathroom v.s. BSC and attempting to engage in hygiene to increase independence in ADLs. Patient needed encouragement to engage in these tasks with patient wanting to direct session.Patient will benefit from continued inpatient follow up therapy, <3 hours/day. Patient's discharge plan remains appropriate at this time. OT will continue to follow acutely.        If plan is discharge home, recommend the following:  A little help with walking and/or transfers;A lot of help with bathing/dressing/bathroom;Assistance with cooking/housework;Direct supervision/assist for financial management;Direct supervision/assist for medications management;Assist for transportation;Supervision due to cognitive status   Equipment Recommendations  None recommended by OT       Precautions / Restrictions Precautions Precautions: Fall Recall of Precautions/Restrictions: Impaired Precaution/Restrictions Comments: R PCN drain Restrictions Weight Bearing Restrictions Per Provider Order: No       Mobility Bed Mobility               General bed mobility comments: patient was up in recliner and returned to the same.         Balance             Standing balance-Leahy Scale: Fair Standing balance comment: squating to complete hygiene tasks.           ADL either performed or assessed with clinical judgement   ADL Overall ADL's : Needs assistance/impaired                         Toilet Transfer:  BSC/3in1;Stand-pivot;Supervision/safety Toilet Transfer Details (indicate cue type and reason): patient declined to use bathroom demanding for OT to bring [facility] to her. patient was educated on getting into bathroom being goal for being able to transition home. patient reported " i am not strong enough for that" Toileting- Clothing Manipulation and Hygiene: Contact guard assist Toileting - Clothing Manipulation Details (indicate cue type and reason): patient was able to squat to complete peri care with RUE with increased time. patient needed max encouragement to engage in this task with nurse present in room encouraging patietn as well. patient asking for nurse to complete task for her when therapist first asked patient to complete task.             Communication Communication Communication: No apparent difficulties   Cognition Arousal: Alert Behavior During Therapy: WFL for tasks assessed/performed               OT - Cognition Comments: patietn has poor awareness of OT role even with education. patient has poor safety awareness and quick to make sharp comments at caregivers.                                        Pertinent Vitals/ Pain       Pain Assessment Pain Assessment: Faces Faces Pain Scale: Hurts little more Pain Location: lower abdomen Pain Descriptors / Indicators: Tender, Sore, Aching, Grimacing Pain Intervention(s): Limited activity within patient's tolerance, Monitored during session, Repositioned  Frequency  Min 2X/week        Progress Toward Goals  OT Goals(current goals can now be found in the care plan section)  Progress towards OT goals: Progressing toward goals     Plan         AM-PAC OT "6 Clicks" Daily Activity     Outcome Measure   Help from another person eating meals?: A Little Help from another person taking care of personal grooming?: A Little Help from another person toileting, which includes using toliet, bedpan,  or urinal?: A Lot Help from another person bathing (including washing, rinsing, drying)?: A Lot Help from another person to put on and taking off regular upper body clothing?: A Little Help from another person to put on and taking off regular lower body clothing?: A Lot 6 Click Score: 15    End of Session Equipment Utilized During Treatment: Rolling walker (2 wheels)  OT Visit Diagnosis: Unsteadiness on feet (R26.81);Muscle weakness (generalized) (M62.81);History of falling (Z91.81);Other symptoms and signs involving cognitive function   Activity Tolerance Patient tolerated treatment well   Patient Left in bed;with call bell/phone within reach;with nursing/sitter in room;with bed alarm set   Nurse Communication Mobility status;Precautions        Time: 9562-1308 OT Time Calculation (min): 22 min  Charges: OT General Charges $OT Visit: 1 Visit OT Treatments $Self Care/Home Management : 8-22 mins  Wynette Heckler, MS Acute Rehabilitation Department Office# [phone]   Jame Maze 08/25/2023, 2:17 PM

## 2023-08-25 NOTE — Addendum Note (Signed)
 Addendum  created 08/25/23 1151 by [place], Stafford Riviera C, CRNA   Intraprocedure Devices edited, Patient device edited, Patient device removed

## 2023-08-25 NOTE — TOC Progression Note (Addendum)
 Transition of Care [facility]) - Progression Note    Patient Details  Name: Laurie Bowman MRN: [id number] Date of Birth: 24-Sep-1940  [facility] CM/SW Contact  Gertha Ku, LCSW Phone Number: 08/25/2023, 10:49 AM  Clinical Narrative:    CSW spoke with the pt's spouse to discuss facility choice. The spouse stated he is trying to get in contact with [facility], as the pt was previously there. CSW explained that she has tried to contact [facility] several times with no response. CSW informed the spouse that the pt will need to choose a bed so authorization can be started. The pt mentioned having questions for the MD; the MD was made aware. TOC to follow.  ADDEN  12:00pm CSW received message from [facility] they can offer bed. Spoke with pt's spouse he has accepted bed offer, insurance auth was started and is pending.TOC to follow.   Expected Discharge Plan: Skilled Nursing Facility Barriers to Discharge: Continued Medical Work up  Expected Discharge Plan and Services In-house Referral: Clinical Social Work     Living arrangements for the past 2 months: Single Family Home                 DME Arranged: N/A DME Agency: NA                   Social Determinants of Health (SDOH) Interventions SDOH Screenings   Food Insecurity: No Food Insecurity (08/17/2023)  Housing: Low Risk  (08/17/2023)  Transportation Needs: No Transportation Needs (08/17/2023)  Utilities: Not At Risk (08/17/2023)  Social Connections: Unknown (08/18/2023)  Tobacco Use: Medium Risk (08/20/2023)    Readmission Risk Interventions     No data to display

## 2023-08-25 NOTE — Progress Notes (Signed)
 PHARMACY - ANTICOAGULATION CONSULT NOTE  Pharmacy Consult for lovenox  --> Eliquis  Indication: atrial fibrillation  Allergies  Allergen Reactions   Cefaclor Itching    Other reaction(s): Flushing (ALLERGY/intolerance)   Codeine Itching and Nausea And Vomiting   Fructose Swelling and Nausea And Vomiting    Swelling and GI upset, Fructose Granules  Fructose Granules   Sulfa Antibiotics Swelling   Meperidine Nausea And Vomiting and Itching   Elemental Sulfur Swelling   Propoxyphene Nausea And Vomiting    Patient Measurements: Height: 5' (152.4 cm) Weight: 62.6 kg (138 lb) IBW/kg (Calculated) : 45.5 HEPARIN  DW (KG): 58.6  Vital Signs: Temp: 98 F (36.7 C) (06/09 0414) Temp Source: Oral (06/09 0414) BP: 148/73 (06/09 0414) Pulse Rate: 61 (06/09 0414)  Labs: Recent Labs    08/23/23 0418 08/24/23 0351 08/25/23 0411  HGB 10.4* 10.4*  --   HCT 31.8* 31.0*  --   PLT 277 327  --   CREATININE 1.16* 1.04* 1.06*    Estimated Creatinine Clearance: 34.4 mL/min (A) (by C-G formula based on SCr of 1.06 mg/dL (H)).   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   Hypokalemia 09/27/2020   Infection due to Staphylococcus epidermidis 09/27/2020   Irritable bowel syndrome 12/16/2013   LBBB (left bundle branch block) 04/30/2018   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   Major depressive disorder, recurrent, unspecified (HCC) 12/16/2013   Mitral regurgitation 04/30/2018   Moderate persistent asthma 09/28/2015   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 06/21/2015   Other chest pain 09/27/2020   Other specified disorders of eyelid 11/09/2015   Pain in unspecified knee 12/16/2013   Peripheral visual field defect of both eyes 04/21/2015   PVC's (premature ventricular contractions) 04/30/2018   Rotator cuff tendonitis 12/16/2013   Undiagnosed cardiac murmurs 12/16/2013   Overview:  Mild MR/TR, echo 2007 without change since 2001   Urticaria    Ventricular premature depolarization 12/16/2013   Wound dehiscence 05/08/2015    Assessment: Patient is an 83 y.o  F with hx afib on Eliquis  5mg  bid PTA who presented to the ED on 08/17/23 for evaluation s/p fall. Eliquis  resumed on admission and changed to full dose LMWH on 08/21/23 s/p cystoscopy with ureteral stent placement. She experienced some hematuria s/p procedure, but that has resolved.  Pharmacy has been consulted on 08/25/23 to transition patient back to Eliquis .  Today, 08/25/2023: - cbc on 6/8 stable - urine documented as clear, yellow - scr 1.06 - last dose of lovenox  60mg  SQ 12h was given to patient on 08/25/23 at 0927   Plan:  - d/c lovenox  - start Eliquis  5mg  bid at 10pm tonight - pharmacy will signoff. Reconsult us  if need further assistance.  Jovanni Eckhart P 08/25/2023,10:51 AM

## 2023-08-26 DIAGNOSIS — N179 Acute kidney failure, unspecified: Secondary | ICD-10-CM | POA: Diagnosis not present

## 2023-08-26 LAB — CBC
HCT: 32.6 % — ABNORMAL LOW (ref 36.0–46.0)
Hemoglobin: 10.4 g/dL — ABNORMAL LOW (ref 12.0–15.0)
MCH: 31.3 pg (ref 26.0–34.0)
MCHC: 31.9 g/dL (ref 30.0–36.0)
MCV: 98.2 fL (ref 80.0–100.0)
Platelets: 378 10*3/uL (ref 150–400)
RBC: 3.32 MIL/uL — ABNORMAL LOW (ref 3.87–5.11)
RDW: 13.7 % (ref 11.5–15.5)
WBC: 9 10*3/uL (ref 4.0–10.5)
nRBC: 0 % (ref 0.0–0.2)

## 2023-08-26 LAB — BASIC METABOLIC PANEL WITH GFR
Anion gap: 10 (ref 5–15)
BUN: 11 mg/dL (ref 8–23)
CO2: 26 mmol/L (ref 22–32)
Calcium: 9.1 mg/dL (ref 8.9–10.3)
Chloride: 102 mmol/L (ref 98–111)
Creatinine, Ser: 1.25 mg/dL — ABNORMAL HIGH (ref 0.44–1.00)
GFR, Estimated: 43 mL/min — ABNORMAL LOW (ref >60)
Glucose, Bld: 84 mg/dL (ref 70–99)
Potassium: 3.6 mmol/L (ref 3.5–5.1)
Sodium: 138 mmol/L (ref 135–145)

## 2023-08-26 LAB — PHOSPHORUS: Phosphorus: 4.4 mg/dL (ref 2.5–4.6)

## 2023-08-26 MED ORDER — SACCHAROMYCES BOULARDII 250 MG PO CAPS: 250.0000 mg | ORAL_CAPSULE | Freq: Two times a day (BID) | ORAL | Status: AC

## 2023-08-26 MED ORDER — AMOXICILLIN-POT CLAVULANATE 500-125 MG PO TABS
1.0000 | ORAL_TABLET | Freq: Two times a day (BID) | ORAL | Status: DC
  Administered 2023-08-26 – 2023-08-28 (×5): 1 via ORAL
  Filled 2023-08-26 (×5): qty 1

## 2023-08-26 MED ORDER — SACCHAROMYCES BOULARDII 250 MG PO CAPS
250.0000 mg | ORAL_CAPSULE | Freq: Two times a day (BID) | ORAL | Status: DC
  Administered 2023-08-26 – 2023-08-28 (×5): 250 mg via ORAL
  Filled 2023-08-26 (×5): qty 1

## 2023-08-26 NOTE — TOC Progression Note (Signed)
 Transition of Care [facility]) - Progression Note    Patient Details  Name: Laurie Bowman MRN: [id number] Date of Birth: Jan 06, 1941  [facility]) CM/SW Contact  Gertha Ku, [place] Phone Number: 08/26/2023, 9:29 PM  Clinical Narrative:     Pt's insurance [employer] was approved for [employer] ID# [id number] 08/25/2023-08/27/2023.TOC to follow.  Expected Discharge Plan: Skilled Nursing Facility   Expected Discharge Plan and Services In-house Referral: Clinical Social Work     Living arrangements for the past 2 months: Single Family Home                 DME Arranged: N/A DME Agency: NA                   Social Determinants of Health (SDOH) Interventions SDOH Screenings   Food Insecurity: No Food Insecurity (08/17/2023)  Housing: Low Risk  (08/17/2023)  Transportation Needs: No Transportation Needs (08/17/2023)  Utilities: Not At Risk (08/17/2023)  Social Connections: Unknown (08/18/2023)  Tobacco Use: Medium Risk (08/20/2023)    Readmission Risk Interventions     No data to display

## 2023-08-26 NOTE — Progress Notes (Signed)
 PHARMACY NOTE:  ANTIMICROBIAL RENAL DOSAGE ADJUSTMENT  Current antimicrobial regimen includes a mismatch between antimicrobial dosage and estimated renal function.  As per policy approved by the Pharmacy & Therapeutics and Medical Executive Committees, the antimicrobial dosage will be adjusted accordingly.  Current antimicrobial dosage:  Augmentin  875 mg q12h  Indication: UTI   Renal Function:  Estimated Creatinine Clearance: 29.1 mL/min (A) (by C-G formula based on SCr of 1.25 mg/dL (H)). []      On intermittent HD, scheduled: []      On CRRT    Antimicrobial dosage has been changed to:  Augmentin  500 mg q12h    Thank you for allowing pharmacy to be a part of this patient's care.  Spurgeon Dyer, [facility] 08/26/2023 7:28 AM

## 2023-08-26 NOTE — Progress Notes (Signed)
 PROGRESS NOTE Laurie Bowman  QIO:[id number] DOB: 1940-10-16 DOA: 08/17/2023 PCP: Sheilah Denver., MD  Brief Narrative/Hospital Course: 83 y.o. female with history of hypertrophic cardiomyopathy, paroxysmal atrial fibrillation, hyperlipidemia, recently diagnosed bladder cancer underwent BCG treatment last week at [facility] since then has been feeling weak tired fatigued poor appetite and also has been having increasing pain on micturition.  Had subjective feeling of fever chills.  Patient felt so weak today that she slid out of the bed did not hit her head or lose consciousness.  She's been admitted with general malaise, AKI.  CT with findings possibly concerning for a UTI.  Cardiology following for afib with RVR, elevated troponin.  Urology planning for stent-attempted but unsuccessful> subsequently nephrostomy catheter placed by IR on the right IR has cleared her for discharge >No additional VIR intervention during current hospitalization. She will return for routine exchange, possible internalization in 6-8wks. some abdominal discomfort CT abdomen 6/9 no new changes  Subjective: Patient seen and examined Complains of abdominal cramp, gaseous abdomen-some relief with Bentyl  Some loose stool Overnight she has been afebrile BP stable labs reviewed 6/stable creatinine slightly up 1.2 CBC overall stable   Assessment and plan:  E coli UTI  Possible Pyelonephritis Right Sided Hydronephrosis Generalized Malaise  CT with trace gas in bladder and subtle perivesical stranding, mild right hydroureteronephrosis  Pansensitive e. Coli on urine culture. Blood culture NGTD> continue po Augmentin . Urology planned  stent-but unsuccessful as could not find the orifice> subsequently nephrostomy catheter placed by IR  Back on  Eliquis  after discussion with IR 6/9. Okay for discharge plan to follow-up with IR in 6 to 8 weeks for possible internalization    Hypokalemia: Hyponatremia Hypophosphatemia Hypomagnesemia: resolved.  Electrolytes stable  Normocytic anemia: Hemoglobin holding ~ 10 to 11 g range.  Continue to monitor   A-fib with RVR  Patient had RVR prior to procedure, TSH normal amiodarone  transitioned to p.o, Eliquis , verapamil .   History of IBS Abdominal cramps/distention/elevated stool; CT abdomen 6/9 no acute finding continue symptomatic management.  AKI: likely from poor oral intake and dehydration, ?  Obstructive uropathy creatinine has improved. CT with mild R hydroureteronephrosis (possibly with recent passage of stone or infection)   Acute Metabolic Encephalopathy  Suspect due to above-with UTI dehydration.  Improved.  Continue delirium precaution fall precaution.    Elevated troponin: Seen by cardiology echo done suspect demand ischemia   Mild rhabdomyolysis  Elevated CK:  CK improved, continue statin   Bladder cancer S/p TURBT 06/2023, intravesicular BCG last week    Hyperlipidemia: Resime statins   Hypertension Stable continue verapamil . Holding thiazide, holding minoxidil    Hypertrophic cardiomyopathy  Caution with volume    Depression Stable on Wellbutrin .  Deconditioning/debility: Continue PT OT plan for skilled nursing. Toc FOLLOWING-discussed with TOC .  Awaiting placement to SNF  DVT prophylaxis:  Lovenox  Code Status:   Code Status: Full Code Family Communication: plan of care discussed with patient at bedside. Patient status is: Remains hospitalized because of severity of illness Level of care: Telemetry   Dispo: The patient is from: home            Anticipated disposition: Plan for SNF tomorrow if abdominal pain better  Objective: Vitals last 24 hrs: Vitals:   08/25/23 0414 08/25/23 1221 08/25/23 2006 08/26/23 0425  BP: (!) 148/73 (!) 143/67 (!) 161/81 (!) 141/69  Pulse: 61 (!) 58 (!) 59 62  Resp:  18 18 19   Temp: 98 F (36.7 C) 97.8 F (  36.6 C) 98.1 F (36.7 C) 98.2 F  (36.8 C)  TempSrc: Oral Oral Oral Oral  SpO2: 96% 98% 97% 95%  Weight:      Height:        Physical Examination: General exam: alert awake, oriented at baseline, older than stated age HEENT:Oral mucosa moist, Ear/Nose WNL grossly Respiratory system: Bilaterally clear BS,no use of accessory muscle Cardiovascular system: S1 & S2 +, No JVD. Gastrointestinal system: Abdomen soft, mild generalized tenderness, ND, BS+ Nervous System: Alert, awake, moving all extremities,and following commands. Extremities: LE edema neg,distal peripheral pulses palpable and warm.  Skin: No rashes,no icterus. MSK: Normal muscle bulk,tone, power   Data Reviewed: I have personally reviewed following labs and imaging studies ( see epic result tab) CBC: Recent Labs  Lab 08/20/23 0415 08/21/23 0420 08/22/23 0348 08/23/23 0418 08/24/23 0351 08/26/23 0418  WBC 8.6 8.1 7.6 7.2 7.6 9.0  NEUTROABS 7.1  --   --   --   --   --   HGB 10.7* 10.0* 10.0* 10.4* 10.4* 10.4*  HCT 32.5* 30.6* 29.6* 31.8* 31.0* 32.6*  MCV 96.7 95.9 93.7 95.8 94.5 98.2  PLT 153 162 203 277 327 378   CMP: Recent Labs  Lab 08/20/23 0415 08/21/23 0419 08/21/23 0420 08/22/23 0348 08/23/23 0418 08/24/23 0351 08/25/23 0410 08/25/23 0411 08/26/23 0417 08/26/23 0418  NA 130*  --    < > 133* 136 139 138  --   --  138  K 3.2*  --    < > 3.0* 3.6 3.0* 3.5  --   --  3.6  CL 100  --    < > 103 104 105 102  --   --  102  CO2 21*  --    < > 22 25 25 27   --   --  26  GLUCOSE 110*  --    < > 104* 93 97 89  --   --  84  BUN 17  --    < > 18 15 14 14   --   --  11  CREATININE 1.18*  --    < > 1.04* 1.16* 1.04* 1.11* 1.06*  --  1.25*  CALCIUM  8.2*  --    < > 8.4* 8.7* 8.8* 8.9  --   --  9.1  MG 1.6* 1.8  --  1.8  --   --   --   --   --   --   PHOS 2.0* 2.6  --   --   --   --   --   --  4.4  --    < > = values in this interval not displayed.   GFR: Estimated Creatinine Clearance: 29.1 mL/min (A) (by C-G formula based on SCr of 1.25 mg/dL  (H)). Recent Labs  Lab 08/20/23 0415 08/24/23 0351 08/25/23 0410  AST 71* 35 35  ALT 46* 36 35  ALKPHOS 52 65 68  BILITOT 1.5* 0.6 0.4  PROT 5.4* 5.4* 5.6*  ALBUMIN 2.5* 2.4* 2.5*    No results for input(s): "LIPASE", "AMYLASE" in the last 168 hours.  No results for input(s): "AMMONIA" in the last 168 hours. Coagulation Profile:  Recent Labs  Lab 08/21/23 1021  INR 1.5*   Unresulted Labs (From admission, onward)     Start     Ordered   08/26/23 0500  Basic metabolic panel with GFR  Every 48 hours,   R     Question:  Specimen collection method  Answer:  Lab=Lab collect   08/24/23 1052   08/26/23 0500  CBC  Every 48 hours,   R     Question:  Specimen collection method  Answer:  Lab=Lab collect   08/24/23 1052   08/21/23 1500  Urine Culture (for pregnant, neutropenic or urologic patients or patients with an indwelling urinary catheter)  (Urine Culture)  Once,   R       Comments: R PCN access   Question:  Indication  Answer:  Flank Pain   08/21/23 1500           Antimicrobials/Microbiology: Anti-infectives (From admission, onward)    Start     Dose/Rate Route Frequency Ordered Stop   08/26/23 1000  amoxicillin -clavulanate (AUGMENTIN ) 500-125 MG per tablet 1 tablet        1 tablet Oral 2 times daily 08/26/23 0731     08/21/23 1400  levofloxacin  (LEVAQUIN ) IVPB 500 mg        500 mg 100 mL/hr over 60 Minutes Intravenous  Once 08/21/23 1021 08/21/23 1620   08/20/23 1600  ceFAZolin  (ANCEF ) IVPB 2g/100 mL premix        2 g 200 mL/hr over 30 Minutes Intravenous  Once 08/20/23 1558 08/20/23 1630   08/20/23 1559  ceFAZolin  (ANCEF ) 2-4 GM/100ML-% IVPB       Note to Pharmacy: Gaines, Janel R: cabinet override      08/20/23 1559 08/20/23 1623   08/19/23 2200  amoxicillin -clavulanate (AUGMENTIN ) 875-125 MG per tablet 1 tablet  Status:  Discontinued       Note to Pharmacy: Retime as appropriate based on last levaquin  dose   1 tablet Oral Every 12 hours 08/19/23 1604 08/26/23  0731   08/19/23 1800  levofloxacin  (LEVAQUIN ) IVPB 750 mg  Status:  Discontinued        750 mg 100 mL/hr over 90 Minutes Intravenous Every 48 hours 08/17/23 2305 08/18/23 0310   08/19/23 1800  levofloxacin  (LEVAQUIN ) IVPB 500 mg  Status:  Discontinued        500 mg 100 mL/hr over 60 Minutes Intravenous Every 48 hours 08/18/23 0310 08/19/23 1604   08/19/23 0730  ceFAZolin  (ANCEF ) IVPB 2g/100 mL premix  Status:  Discontinued        2 g 200 mL/hr over 30 Minutes Intravenous  Once 08/19/23 0724 08/19/23 1300   08/19/23 0704  ceFAZolin  (ANCEF ) 2-4 GM/100ML-% IVPB  Status:  Discontinued       Note to Pharmacy: [name] W: cabinet override      08/19/23 0704 08/19/23 1241   08/17/23 1815  levofloxacin  (LEVAQUIN ) IVPB 500 mg        500 mg 100 mL/hr over 60 Minutes Intravenous  Once 08/17/23 1808 08/17/23 1919         Component Value Date/Time   SDES  08/18/2023 1241    BLOOD BLOOD RIGHT ARM Performed at [facility], [address], [place], [place] [address]    SPECREQUEST  08/18/2023 1241    BOTTLES DRAWN AEROBIC AND ANAEROBIC Blood Culture adequate volume Performed at [facility], [address], [place], [place] [address]    CULT  08/18/2023 1241    NO GROWTH 5 DAYS Performed at [facility], [address], [place], [place] [address]    REPTSTATUS 08/23/2023 FINAL 08/18/2023 1241    Procedures: Procedure(s) (LRB): CYSTOSCOPY, WITH RETROGRADE PYELOGRAM AND URETERAL STENT INSERTION (Right) Medications reviewed:  Scheduled Meds:  amiodarone   200 mg Oral BID   [  START ON 08/29/2023] amiodarone   200 mg Oral Daily   amoxicillin -clavulanate  1 tablet Oral BID   apixaban   5 mg Oral BID   buPROPion   150 mg Oral Daily   feeding supplement  237 mL Oral BID BM   pantoprazole   40 mg Oral Daily   rosuvastatin   10 mg Oral Daily   saccharomyces boulardii  250 mg Oral BID   sodium chloride  flush  5 mL Intracatheter Q8H   verapamil   120 mg  Oral Daily   Continuous Infusions:  magnesium  sulfate bolus IVPB      Lyndie Vanderloop, MD [facility] 08/26/2023, 1:01 PM

## 2023-08-27 ENCOUNTER — Inpatient Hospital Stay (HOSPITAL_COMMUNITY)

## 2023-08-27 DIAGNOSIS — N179 Acute kidney failure, unspecified: Secondary | ICD-10-CM | POA: Diagnosis not present

## 2023-08-27 LAB — CBC
HCT: 35.4 % — ABNORMAL LOW (ref 36.0–46.0)
Hemoglobin: 11.5 g/dL — ABNORMAL LOW (ref 12.0–15.0)
MCH: 31.4 pg (ref 26.0–34.0)
MCHC: 32.5 g/dL (ref 30.0–36.0)
MCV: 96.7 fL (ref 80.0–100.0)
Platelets: 388 10*3/uL (ref 150–400)
RBC: 3.66 MIL/uL — ABNORMAL LOW (ref 3.87–5.11)
RDW: 13.5 % (ref 11.5–15.5)
WBC: 9.6 10*3/uL (ref 4.0–10.5)
nRBC: 0 % (ref 0.0–0.2)

## 2023-08-27 LAB — COMPREHENSIVE METABOLIC PANEL WITH GFR
ALT: 30 U/L (ref 0–44)
AST: 29 U/L (ref 15–41)
Albumin: 2.9 g/dL — ABNORMAL LOW (ref 3.5–5.0)
Alkaline Phosphatase: 66 U/L (ref 38–126)
Anion gap: 10 (ref 5–15)
BUN: 10 mg/dL (ref 8–23)
CO2: 25 mmol/L (ref 22–32)
Calcium: 9.5 mg/dL (ref 8.9–10.3)
Chloride: 102 mmol/L (ref 98–111)
Creatinine, Ser: 1.07 mg/dL — ABNORMAL HIGH (ref 0.44–1.00)
GFR, Estimated: 52 mL/min — ABNORMAL LOW
Glucose, Bld: 94 mg/dL (ref 70–99)
Potassium: 2.9 mmol/L — ABNORMAL LOW (ref 3.5–5.1)
Sodium: 137 mmol/L (ref 135–145)
Total Bilirubin: 0.6 mg/dL (ref 0.0–1.2)
Total Protein: 6 g/dL — ABNORMAL LOW (ref 6.5–8.1)

## 2023-08-27 MED ORDER — POTASSIUM CHLORIDE 10 MEQ/100ML IV SOLN: 10.0000 meq | INTRAVENOUS | Status: AC

## 2023-08-27 MED ORDER — AMOXICILLIN-POT CLAVULANATE 500-125 MG PO TABS: 1.0000 | ORAL_TABLET | Freq: Two times a day (BID) | ORAL | Status: DC

## 2023-08-27 MED ORDER — VERAPAMIL HCL ER 120 MG PO TBCR: 120.0000 mg | EXTENDED_RELEASE_TABLET | Freq: Every day | ORAL | Status: DC

## 2023-08-27 MED ORDER — DICYCLOMINE HCL 10 MG PO CAPS
20.0000 mg | ORAL_CAPSULE | Freq: Three times a day (TID) | ORAL | Status: DC | PRN
  Administered 2023-08-27 – 2023-08-28 (×2): 20 mg via ORAL
  Filled 2023-08-27 (×2): qty 2

## 2023-08-27 MED ORDER — POTASSIUM CHLORIDE CRYS ER 20 MEQ PO TBCR
40.0000 meq | EXTENDED_RELEASE_TABLET | ORAL | Status: AC
  Administered 2023-08-27: 40 meq via ORAL
  Filled 2023-08-27: qty 2

## 2023-08-27 MED ORDER — ENSURE PLUS HIGH PROTEIN PO LIQD: 237.0000 mL | Freq: Two times a day (BID) | ORAL | Status: DC

## 2023-08-27 MED ORDER — LOPERAMIDE HCL 2 MG PO CAPS
2.0000 mg | ORAL_CAPSULE | ORAL | Status: AC | PRN
Start: 2023-08-27 — End: ?

## 2023-08-27 NOTE — Progress Notes (Signed)
 Patient Laurie Bowman declines placement of an IV.  Patient stated that she is going home in the AM.

## 2023-08-27 NOTE — Progress Notes (Signed)
 Physical Therapy Treatment Patient Details Name: Laurie Bowman MRN: [id number] DOB: [birth year] Today's Date: 08/27/2023   History of Present Illness 83 yo female admitted with ARF, UTI, hyponatremia, delirium. Pt s/p right nephroureteral catheter placement 6/5 Hx of L TKA, bladder Ca-undergoing tx, cardiomyopathy, lichen slerosus, osteoporosis, breast Ca, IBS, CKD, asthma, rhabdo, falls    PT Comments  Pt seen for PT tx with pt agreeable, despite c/o nausea today. Pt is able to complete bed mobility with hospital bed features & extra time. Pt transfers sit>stand with supervision, CGA for gait in hallway with RW but pt does increase gait distance on this date. At end of session pt reported dizziness with gait (also noticed it when lying flat earlier today) but report it goes away once sitting in recliner. Pt agreeable to sit in recliner but declined further gait attempts. Will continue to follow pt acutely to progress mobility as able.    If plan is discharge home, recommend the following: Assistance with cooking/housework;Assist for transportation;Help with stairs or ramp for entrance;A little help with walking and/or transfers;A little help with bathing/dressing/bathroom   Can travel by private vehicle     Yes  Equipment Recommendations  None recommended by PT    Recommendations for Other Services       Precautions / Restrictions Precautions Precautions: Fall Precaution/Restrictions Comments: R PCN drain Restrictions Weight Bearing Restrictions Per Provider Order: No     Mobility  Bed Mobility Overal bed mobility: Needs Assistance Bed Mobility: Supine to Sit     Supine to sit: Supervision, HOB elevated, Used rails (extra time)          Transfers Overall transfer level: Needs assistance Equipment used: None Transfers: Sit to/from Stand Sit to Stand: Supervision           General transfer comment: sit>stand from EOB    Ambulation/Gait Ambulation/Gait assistance:  Contact guard assist Gait Distance (Feet): 80 Feet Assistive device: Rolling walker (2 wheels) Gait Pattern/deviations: Decreased stride length, Decreased step length - right, Decreased step length - left Gait velocity: decreased     General Gait Details: intermittent pauses when walking back to room, not really true standing rest breaks but decrease in already slow gait speed   Stairs             Wheelchair Mobility     Tilt Bed    Modified Rankin (Stroke Patients Only)       Balance Overall balance assessment: Needs assistance Sitting-balance support: Feet supported Sitting balance-Leahy Scale: Fair     Standing balance support: Bilateral upper extremity supported, Reliant on assistive device for balance, During functional activity Standing balance-Leahy Scale: Fair                              Hotel manager: No apparent difficulties  Cognition Arousal: Alert Behavior During Therapy: WFL for tasks assessed/performed   PT - Cognitive impairments: No apparent impairments                         Following commands: Intact      Cueing Cueing Techniques: Verbal cues  Exercises      General Comments General comments (skin integrity, edema, etc.): c/o dizziness when lying flat, during gait, reports it goes away once sitting in recliner. May benefit from vestibular eval if pt bumped her head during fall. Pt endorsed feeling nauseous today.      Pertinent  Vitals/Pain Pain Assessment Pain Assessment: No/denies pain    Home Living                          Prior Function            PT Goals (current goals can now be found in the care plan section) Acute Rehab PT Goals Patient Stated Goal: regain plof/independence;less discomfort/pain PT Goal Formulation: With patient Time For Goal Achievement: 09/01/23 Potential to Achieve Goals: Good Progress towards PT goals: Progressing toward goals     Frequency    Min 3X/week      PT Plan      Co-evaluation              AM-PAC PT 6 Clicks Mobility   Outcome Measure  Help needed turning from your back to your side while in a flat bed without using bedrails?: None Help needed moving from lying on your back to sitting on the side of a flat bed without using bedrails?: A Little Help needed moving to and from a bed to a chair (including a wheelchair)?: A Little Help needed standing up from a chair using your arms (e.g., wheelchair or bedside chair)?: A Little Help needed to walk in hospital room?: A Little Help needed climbing 3-5 steps with a railing? : A Lot 6 Click Score: 18    End of Session   Activity Tolerance: Patient limited by fatigue Patient left: in chair;with chair alarm set;with call bell/phone within reach   PT Visit Diagnosis: Muscle weakness (generalized) (M62.81);History of falling (Z91.81);Difficulty in walking, not elsewhere classified (R26.2)     Time: 4098-1191 PT Time Calculation (min) (ACUTE ONLY): 13 min  Charges:    $Therapeutic Activity: 8-22 mins PT General Charges $$ ACUTE PT VISIT: 1 Visit                     Emaline Handsome, PT, DPT 08/27/23, 3:44 PM   Venetta Gill 08/27/2023, 3:43 PM

## 2023-08-27 NOTE — Plan of Care (Signed)
   Problem: Education: Goal: Knowledge of General Education information will improve Description Including pain rating scale, medication(s)/side effects and non-pharmacologic comfort measures Outcome: Progressing   Problem: Clinical Measurements: Goal: Ability to maintain clinical measurements within normal limits will improve Outcome: Progressing   Problem: Activity: Goal: Risk for activity intolerance will decrease Outcome: Progressing

## 2023-08-27 NOTE — Plan of Care (Signed)
 Pt given prn needs for abdominal discomfort, continue pain plan of care.

## 2023-08-27 NOTE — Discharge Summary (Deleted)
 Physician Discharge Summary  Laurie Bowman ZOX:[id number] DOB: Aug 08, 1940 DOA: 08/17/2023  PCP: Sheilah Denver., MD  Admit date: 08/17/2023 Discharge date: 08/27/2023 Recommendations for Outpatient Follow-up:  Follow up with PCP in 1 weeks-call for appointment Please obtain BMP/CBC in one week  Discharge Dispo: SNF Discharge Condition: Stable Code Status:   Code Status: Full Code Diet recommendation:  Diet Order             Diet Heart Room service appropriate? Yes; Fluid consistency: Thin  Diet effective now                    Brief/Interim Summary: 83 y.o. female with history of hypertrophic cardiomyopathy, paroxysmal atrial fibrillation, hyperlipidemia, recently diagnosed bladder cancer underwent BCG treatment last week at [facility] since then has been feeling weak tired fatigued poor appetite and also has been having increasing pain on micturition.  Had subjective feeling of fever chills.  Patient felt so weak today that she slid out of the bed did not hit her head or lose consciousness.  She's been admitted with general malaise, AKI.  CT with findings possibly concerning for a UTI.  Cardiology following for afib with RVR, elevated troponin.  Urology planning for stent-attempted but unsuccessful> subsequently nephrostomy catheter placed by IR on the right IR has cleared her for discharge >No additional VIR intervention during current hospitalization. She will return for routine exchange, possible internalization in 6-8wks. some abdominal discomfort CT abdomen 6/9 no new changes.  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  Patient has been approved for skilled nursing facility and has a bed available.  Subjective: Seen examined Aaox3 Feels better  Stool not as liquidy Overnight afebrile BP stable BM x 2 charted Feels ready to go to snf today  Discharge diagnosis   E coli UTI  Possible Pyelonephritis Right Sided Hydronephrosis Generalized Malaise   CT with trace gas in bladder and subtle perivesical stranding, mild right hydroureteronephrosis  Pansensitive e. Coli on urine culture. Blood culture NGTD> managed w/ Augmentin . Urology planned  stent-but unsuccessful as could not find the orifice> subsequently nephrostomy catheter placed by IR  Back on  Eliquis  after discussion with IR 6/9. Okay for discharge plan to follow-up with IR in 6 to 8 weeks for possible internalization   Hypokalemia: Hyponatremia Hypophosphatemia Hypomagnesemia: Electrolytes stable/ cont her kdur daily at snf  Normocytic anemia: Hemoglobin holding ~ 10 to 11 g range.  Continue to monitor   A-fib with RVR  Patient had RVR prior to procedure, TSH normal amiodarone  transitioned to p.o, Eliquis , verapamil .   History of IBS Abdominal cramps/distention/elevated stool; CT abdomen 6/9 no acute finding continue symptomatic management.  No leukocytosis fever, does have abdominal tenderness but with IBS history, abdomen is soft.  AKI on CKD 3a: Baseline creatinine as low as 0.9, creatinine peaked at 1.4 indicating AKI-likely from poor oral intake and dehydration,?Obstructive uropathy creatinine has improved. CT with mild R hydroureteronephrosis (possibly with recent passage of stone or infection) Recent Labs    08/17/23 1612 08/18/23 0034 08/19/23 0358 08/20/23 0415 08/21/23 0420 08/22/23 0348 08/23/23 0418 08/24/23 0351 08/25/23 0410 08/25/23 0411 08/26/23 0418  BUN 24* 24* 23 17 15 18 15 14 14   --  11  CREATININE 1.46* 1.43* 1.18* 1.18* 0.99 1.04* 1.16* 1.04* 1.11* 1.06* 1.25*  CO2 21* 21* 22 21* 22 22 25 25 27   --  26  K 3.6 3.4* 3.3* 3.2* 3.0* 3.0* 3.6 3.0* 3.5  --  3.6  Acute Metabolic Encephalopathy  Suspect due to above-with UTI dehydration.  Mentation improved to baseline AAO x 3   Elevated troponin: Seen by cardiology echo done suspect demand ischemia   Mild rhabdomyolysis  Elevated CK:  CK improved, continue statin   Bladder  cancer S/p TURBT 06/2023, intravesicular BCG last week    Hyperlipidemia: Resime statins   Hypertension Stable continue verapamil . resume thiazide as BP trending up. Cont holding minoxidil    Hypertrophic cardiomyopathy  Caution with volume    Depression Stable on Wellbutrin .  Deconditioning/debility: Continue PT OT plan for skilled nursing.   Consults: Ir urology  Discharge Exam: Vitals:   08/26/23 1900 08/27/23 0546  BP: (!) 157/66 (!) 156/76  Pulse: (!) 56 (!) 56  Resp:  16  Temp: 98.1 F (36.7 C) 98.2 F (36.8 C)  SpO2: 99% 96%   General: Pt is alert, awake, not in acute distress Cardiovascular: RRR, S1/S2 +, no rubs, no gallops Respiratory: CTA bilaterally, no wheezing, no rhonchi Abdominal: Soft, NT, ND, bowel sounds + Extremities: no edema, no cyanosis  Discharge Instructions  Discharge Instructions     Discharge instructions   Complete by: As directed    Please call call MD or return to ER for similar or worsening recurring problem that brought you to hospital or if any fever,nausea/vomiting,abdominal pain, uncontrolled pain, chest pain,  shortness of breath or any other alarming symptoms.  Please follow-up your doctor as instructed in a week time and call the office for appointment.  Please avoid alcohol, smoking, or any other illicit substance and maintain healthy habits including taking your regular medications as prescribed.  You were cared for by a hospitalist during your hospital stay. If you have any questions about your discharge medications or the care you received while you were in the hospital after you are discharged, you can call the unit and ask to speak with the hospitalist on call if the hospitalist that took care of you is not available.  Once you are discharged, your primary care physician will handle any further medical issues. Please note that NO REFILLS for any discharge medications will be authorized once you are discharged, as it is  imperative that you return to your primary care physician (or establish a relationship with a primary care physician if you do not have one) for your aftercare needs so that they can reassess your need for medications and monitor your lab values   Increase activity slowly   Complete by: As directed    No wound care   Complete by: As directed       Allergies as of 08/27/2023       Reactions   Cefaclor Itching   Other reaction(s): Flushing (ALLERGY/intolerance)   Codeine Itching, Nausea And Vomiting   Fructose Swelling, Nausea And Vomiting   Swelling and GI upset, Fructose Granules Fructose Granules   Sulfa Antibiotics Swelling   Meperidine Nausea And Vomiting, Itching   Elemental Sulfur Swelling   Propoxyphene Nausea And Vomiting        Medication List     STOP taking these medications    minoxidil 2.5 MG tablet Commonly known as: LONITEN       TAKE these medications    acetaminophen  500 MG tablet Commonly known as: TYLENOL  Take 500 mg by mouth every 4 (four) hours as needed for moderate pain (pain score 4-6).   albuterol  (2.5 MG/3ML) 0.083% nebulizer solution Commonly known as: PROVENTIL  Take 3 mLs (2.5 mg total) by nebulization every  6 (six) hours as needed for wheezing or shortness of breath.   albuterol  108 (90 Base) MCG/ACT inhaler Commonly known as: VENTOLIN  HFA USE 1-2 PUFFS EVERY 4-6 HOURS AS NEEDED FOR COUGH OR WHEEZE.   amoxicillin -clavulanate 500-125 MG tablet Commonly known as: AUGMENTIN  Take 1 tablet by mouth 2 (two) times daily for 2 days.   apixaban  5 MG Tabs tablet Commonly known as: Eliquis  Take 1 tablet (5 mg total) by mouth 2 (two) times daily.   augmented betamethasone dipropionate 0.05 % cream Commonly known as: DIPROLENE-AF Apply 0.05 application  topically daily as needed (Lichen).   benzonatate  100 MG capsule Commonly known as: TESSALON  Take 100 mg by mouth daily as needed for cough.   buPROPion  150 MG 24 hr tablet Commonly  known as: WELLBUTRIN  XL Take 150 mg by mouth daily.   clobetasol cream 0.05 % Commonly known as: TEMOVATE Apply 1 application  topically daily as needed (Scratches).   clotrimazole-betamethasone cream Commonly known as: LOTRISONE Apply 1 application  topically daily as needed (Fugus).   diclofenac Sodium 1 % Gel Commonly known as: VOLTAREN Apply 2 g topically 4 (four) times daily as needed (Pain). Both knees   dicyclomine  20 MG tablet Commonly known as: BENTYL  Take 20 mg by mouth daily as needed for spasms.   feeding supplement Liqd Take 237 mLs by mouth 2 (two) times daily between meals.   fexofenadine 180 MG tablet Commonly known as: ALLEGRA Take 180 mg by mouth daily.   fluticasone  110 MCG/ACT inhaler Commonly known as: Flovent  HFA Use two puffs twice daily for one to two weeks during flares What changed:  how much to take how to take this when to take this reasons to take this additional instructions   hydrochlorothiazide  25 MG tablet Commonly known as: HYDRODIURIL  Take 1 tablet (25 mg total) by mouth daily.   lansoprazole  30 MG capsule Commonly known as: PREVACID  Take 1 capsule (30 mg total) by mouth 2 (two) times daily before a meal.   loperamide  2 MG capsule Commonly known as: IMODIUM  Take 1 capsule (2 mg total) by mouth as needed for diarrhea or loose stools (diarrhea).   mometasone  50 MCG/ACT nasal spray Commonly known as: NASONEX  Place 2 sprays into the nose daily. What changed:  when to take this reasons to take this   montelukast  10 MG tablet Commonly known as: SINGULAIR  Take 1 tablet (10 mg total) by mouth at bedtime.   olopatadine  0.1 % ophthalmic solution Commonly known as: Patanol Place 1 drop into both eyes 2 (two) times daily as needed.   PEG 3350  17 GM/SCOOP Powd Take 17 g by mouth daily.   potassium chloride  10 MEQ tablet Commonly known as: KLOR-CON  M Take 10-20 mEq by mouth See admin instructions. Take 10 mEq (1 tablet) by mouth  every morning and 20 mEq (2 tablets) every night.   rosuvastatin  10 MG tablet Commonly known as: CRESTOR  Take 10 mg by mouth daily.   saccharomyces boulardii 250 MG capsule Commonly known as: FLORASTOR Take 1 capsule (250 mg total) by mouth 2 (two) times daily for 14 days.   tiZANidine 4 MG tablet Commonly known as: ZANAFLEX Take 2 mg by mouth at bedtime as needed (Sleep).   triamcinolone cream 0.1 % Commonly known as: KENALOG Apply 1 application  topically 2 (two) times daily as needed (Eyes).   verapamil  120 MG CR tablet Commonly known as: CALAN -SR Take 1 tablet (120 mg total) by mouth daily. What changed: Another medication with the  same name was removed. Continue taking this medication, and follow the directions you see here.   Vitamin D 50 MCG (2000 UT) Caps Take 2,000 Units by mouth daily.        Allergies  Allergen Reactions   Cefaclor Itching    Other reaction(s): Flushing (ALLERGY/intolerance)   Codeine Itching and Nausea And Vomiting   Fructose Swelling and Nausea And Vomiting    Swelling and GI upset, Fructose Granules  Fructose Granules   Sulfa Antibiotics Swelling   Meperidine Nausea And Vomiting and Itching   Elemental Sulfur Swelling   Propoxyphene Nausea And Vomiting    The results of significant diagnostics from this hospitalization (including imaging, microbiology, ancillary and laboratory) are listed below for reference.    Microbiology: Recent Results (from the past 240 hours)  Urine Culture     Status: Abnormal   Collection Time: 08/17/23  3:45 PM   Specimen: Urine, Clean Catch  Result Value Ref Range Status   Specimen Description   Final    URINE, CLEAN CATCH Performed at [facility], [address], [place], [place] [address]    Special Requests   Final    NONE Performed at [facility], [address], [place], [place] [address]    Culture >=100,000 COLONIES/mL ESCHERICHIA COLI (A)  Final    Report Status 08/19/2023 FINAL  Final   Organism ID, Bacteria ESCHERICHIA COLI (A)  Final      Susceptibility   Escherichia coli - MIC*    AMPICILLIN 4 SENSITIVE Sensitive     CEFAZOLIN  <=4 SENSITIVE Sensitive     CEFEPIME <=0.12 SENSITIVE Sensitive     CEFTRIAXONE <=0.25 SENSITIVE Sensitive     CIPROFLOXACIN <=0.25 SENSITIVE Sensitive     GENTAMICIN <=1 SENSITIVE Sensitive     IMIPENEM <=0.25 SENSITIVE Sensitive     NITROFURANTOIN  <=16 SENSITIVE Sensitive     TRIMETH/SULFA <=20 SENSITIVE Sensitive     AMPICILLIN/SULBACTAM <=2 SENSITIVE Sensitive     PIP/TAZO <=4 SENSITIVE Sensitive ug/mL    * >=100,000 COLONIES/mL ESCHERICHIA COLI  Culture, blood (Routine X 2) w Reflex to ID Panel     Status: None   Collection Time: 08/18/23 12:39 PM   Specimen: BLOOD RIGHT ARM  Result Value Ref Range Status   Specimen Description   Final    BLOOD RIGHT ARM Performed at [facility], [address], [place], [place] [address]    Special Requests   Final    BOTTLES DRAWN AEROBIC AND ANAEROBIC Blood Culture adequate volume Performed at [facility], [address] [place], [place] [address]    Culture   Final    NO GROWTH 5 DAYS Performed at [facility], [address] [place], [place] [address]    Report Status 08/23/2023 FINAL  Final  Culture, blood (Routine X 2) w Reflex to ID Panel     Status: None   Collection Time: 08/18/23 12:41 PM   Specimen: BLOOD  Result Value Ref Range Status   Specimen Description   Final    BLOOD BLOOD RIGHT ARM Performed at [facility], [address], [place], [place] [address]    Special Requests   Final    BOTTLES DRAWN AEROBIC AND ANAEROBIC Blood Culture adequate volume Performed at [facility], [address], [place], [place] [address]    Culture   Final    NO GROWTH 5 DAYS Performed at [facility],  [address] [place], [place] [address]    Report Status 08/23/2023  FINAL  Final  Surgical PCR screen     Status: Abnormal   Collection Time: 08/20/23  1:15 AM   Specimen: Nasal Mucosa; Nasal Swab  Result Value Ref Range Status   MRSA, PCR NEGATIVE NEGATIVE Final   Staphylococcus aureus POSITIVE (A) NEGATIVE Final    Comment: (NOTE) The Xpert SA Assay (FDA approved for NASAL specimens in patients 80 years of age and older), is one component of a comprehensive surveillance program. It is not intended to diagnose infection nor to guide or monitor treatment. Performed at [facility], [address], [place], [place] [address]     Procedures/Studies: CT ABDOMEN PELVIS WO CONTRAST Result Date: 08/25/2023 CLINICAL DATA:  Postop abdominal pain. Recent cystoscopy with ureteral stent/nephroureteral catheter placement. EXAM: CT ABDOMEN AND PELVIS WITHOUT CONTRAST TECHNIQUE: Multidetector CT imaging of the abdomen and pelvis was performed following the standard protocol without IV contrast. RADIATION DOSE REDUCTION: This exam was performed according to the departmental dose-optimization program which includes automated exposure control, adjustment of the mA and/or kV according to patient size and/or use of iterative reconstruction technique. COMPARISON:  Abdominopelvic CT 08/17/2023 and 03/02/2023. FINDINGS: Lower chest: Stable subpleural scarring at both lung bases. New small right-greater-than-left pleural effusions. No basilar pneumothorax. Aortic and coronary artery atherosclerosis noted. Hepatobiliary: The liver appears unremarkable as imaged in the noncontrast state. Dependent high density within the gallbladder lumen, likely vicarious excretion of contrast or sludge. No discrete gallstones, gallbladder wall thickening or biliary dilatation. Pancreas: Unremarkable. No pancreatic ductal dilatation or surrounding inflammatory changes. Spleen: Normal in size without focal abnormality. Adrenals/Urinary Tract: Both adrenal glands appear normal. Interval  placement of a right percutaneous nephroureteral catheter extending distally into the urinary bladder. The right ureter appears decompressed. There is minimal perinephric soft tissue stranding on the right without evidence of hematoma. No evidence of urinary tract calculus. Stable left renal cysts for which no specific follow-up imaging is recommended. The bladder is decompressed without apparent abnormality. Stomach/Bowel: Enteric contrast has passed into the distal colon. The stomach appears unremarkable for its degree of distension. No evidence of bowel wall thickening, distention or surrounding inflammatory change. The appendix is not visualized and may be surgically absent. There is no pericecal inflammation. Vascular/Lymphatic: Aortic and branch vessel atherosclerosis without evidence of aneurysm. No enlarged abdominopelvic lymph nodes identified. Reproductive: The uterus and ovaries appear unremarkable. No adnexal mass. Other: No evidence of abdominal wall mass or hernia. No ascites or pneumoperitoneum. Musculoskeletal: Increased subcutaneous edema in both flanks and buttocks without focal fluid collection or soft tissue emphysema. No acute osseous findings. Mild multilevel spondylosis. IMPRESSION: 1. Interval placement of a right percutaneous nephroureteral catheter extending distally into the urinary bladder. The right ureter appears decompressed. No evidence of urinary tract calculus or hydronephrosis. 2. No acute abdominopelvic findings. 3. New small right-greater-than-left pleural effusions. Increased subcutaneous edema in both flanks and buttocks without focal fluid collection or soft tissue emphysema. 4.  Aortic Atherosclerosis (ICD10-I70.0). Electronically Signed   By: Elmon Hagedorn M.D.   On: 08/25/2023 17:11   IR  NEPHROURETERAL CATH PLACE RIGHT Result Date: 08/21/2023 INDICATION: Hydronephrosis. Distal ureteral obstruction. Failed retrograde stent placement EXAM: Procedures; 1.  RIGHT ANTEGRADE  NEPHROSTOGRAM 2. ULTRASOUND AND FLUOROSCOPIC GUIDED RIGHT NEPHROURETERAL CATHETER PLACEMENT COMPARISON:  CT AP, 08/17/2023.  Or fluoroscopy, 08/20/2023. MEDICATIONS: Levofloxacin  500 mg IV; The antibiotic was administered in an appropriate time frame prior to skin puncture. ANESTHESIA/SEDATION: Moderate (  conscious) sedation was employed during this procedure. A total of Versed  4 mg and Fentanyl  200 mcg was administered intravenously. Moderate Sedation Time: 44 minutes. The patient's level of consciousness and vital signs were monitored continuously by radiology nursing throughout the procedure under my direct supervision. CONTRAST:  70 mL Isovue 300 - administered into the renal collecting system FLUOROSCOPY: Radiation Exposure Index and estimated peak skin dose (PSD); Reference air kerma (RAK), 16 mGy. COMPLICATIONS: None immediate. PROCEDURE: Informed written consent was obtained from the patient after a discussion of the risks, benefits, and alternatives to treatment. The RIGHT flank region was prepped with sterile prep in a usual fashion, and a sterile drape was applied covering the operative field. A sterile gown and sterile gloves were used for the procedure. A timeout was performed prior to the initiation of the procedure. A pre procedural spot fluoroscopic image was obtained of the upper abdomen. Ultrasound scanning performed of the kidney was negative for significant hydronephrosis. As such, the stone within inferior pole was targeted using a combination of ultrasound and fluoroscopically with a 22 gauge Chiba needle. Access to the collecting system was confirmed with advancement of a Nitrex wire into the collecting system. The needle was exchanged for the inner 3 Fr catheter from an Accustick set and contrast injection confirmed access. An Accustick set was utilized to dilate the tract and was subsequently exchanged for a Kumpe catheter over a Bentson wire. The Kumpe catheter was advanced down the ureter  and into the urinary bladder. Next, over an Amplatz wire, the track was further dilated ultimately allowing placement of a 10 Fr, 22 cm percutaneous nephroureteral catheter with end coiled and locked within the urinary bladder. Contrast was injected and several spot fluoroscopic images were obtained in various obliquities. The catheter was secured at the skin entrance site with an interrupted suture and a stat lock device and connected to a gravity bag. Dressings were applied. The patient tolerated procedure well without immediate postprocedural complication. FINDINGS: *Ultrasound scanning demonstrates a mildly dilated RIGHT collecting system. *Under a combination of ultrasound and fluoroscopic guidance, a posterior inferior calix was targeted, and access the urinary bladder obtained allowing placement of a 10 Fr, 26 cm percutaneous nephroureteral catheter with end coiled and locked within the urinary bladder. IMPRESSION: Successful placement of a RIGHT 10 Fr, 22 cm nephroureteral drainage catheter, with tip at the level of the urinary bladder. RECOMMENDATIONS: *The catheter was placed to gravity drainage, with anticipated capping trial after 24-48 hours. *The patient will return to Vascular Interventional Radiology (VIR) for routine catheter evaluation and and exchange, with possible ureteral stent conversion in 6-8 weeks. Art Largo, MD [facility] Electronically Signed   By: Art Largo M.D.   On: 08/21/2023 17:23   DG C-Arm 1-60 Min-No Report Result Date: 08/20/2023 Fluoroscopy was utilized by the requesting physician.  No radiographic interpretation.   ECHOCARDIOGRAM COMPLETE Result Date: 08/18/2023    ECHOCARDIOGRAM REPORT   Patient Name:   Laurie Bowman Date of Exam: 08/18/2023 Medical Rec #:  [id number]    Height:       60.0 in Accession #:    [id number]   Weight:       138.0 lb Date of Birth:  08/22/1940    BSA:          1.594 m Patient Age:    83  years     BP:           100/52 mmHg Patient Gender:  F            HR:           75 bpm. Exam Location:  Inpatient Procedure: 2D Echo, Cardiac Doppler and Color Doppler (Both Spectral and Color            Flow Doppler were utilized during procedure). Indications:    Elevated Troponin  History:        Patient has prior history of Echocardiogram examinations.                 Arrythmias:Atrial Fibrillation; Risk Factors:Hypertension and                 Dyslipidemia.  Sonographer:    Meagan Baucom RDCS, FE, PE Referring Phys: [id number] ARSHAD N KAKRAKANDY IMPRESSIONS  1. LV outflow tract peak gradient at rest 138 mmHg. Left ventricular ejection fraction, by estimation, is 50 to 55%. The left ventricle has low normal function. The left ventricle has no regional wall motion abnormalities but there is septal-lateral dyssynchrony consistent with LBBB. There is moderate asymmetric left ventricular hypertrophy of the septal segment. Left ventricular diastolic parameters are consistent with Grade I diastolic dysfunction (impaired relaxation).  2. Right ventricular systolic function is normal. The right ventricular size is normal. There is moderately elevated pulmonary artery systolic pressure. The estimated right ventricular systolic pressure is 55.6 mmHg.  3. Left atrial size was moderately dilated.  4. Mitral valve systolic anterior motion is present. The mitral valve is abnormal. Moderate eccentric mitral valve regurgitation. No evidence of mitral stenosis.  5. The aortic valve is tricuspid. There is mild calcification of the aortic valve. Aortic valve regurgitation is not visualized. No aortic stenosis is present.  6. The inferior vena cava is normal in size with <50% respiratory variability, suggesting right atrial pressure of 8 mmHg.  7. 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 FINDINGS  Left Ventricle: LV outflow tract peak gradient at rest 138 mmHg. Left ventricular ejection fraction, by estimation, is 50 to 55%. The left  ventricle has low normal function. The left ventricle has no regional wall motion abnormalities. The left ventricular internal cavity size was normal in size. There is moderate asymmetric left ventricular hypertrophy of the septal segment. Left ventricular diastolic parameters are consistent with Grade I diastolic dysfunction (impaired relaxation). Right Ventricle: The right ventricular size is normal. No increase in right ventricular wall thickness. Right ventricular systolic function is normal. There is moderately elevated pulmonary artery systolic pressure. The tricuspid regurgitant velocity is 3.45 m/s, and with an assumed right atrial pressure of 8 mmHg, the estimated right ventricular systolic pressure is 55.6 mmHg. Left Atrium: Left atrial size was moderately dilated. Right Atrium: Right atrial size was normal in size. Pericardium: There is no evidence of pericardial effusion. Mitral Valve: Mitral valve systolic anterior motion is present. The mitral valve is abnormal. Moderate mitral valve regurgitation. No evidence of mitral valve stenosis. Tricuspid Valve: The tricuspid valve is normal in structure. Tricuspid valve regurgitation is mild. Aortic Valve: The aortic valve is tricuspid. There is mild calcification of the aortic valve. Aortic valve regurgitation is not visualized. No aortic stenosis is present. Pulmonic Valve: The pulmonic valve was normal in structure. Pulmonic valve regurgitation is trivial. Aorta: The aortic root is normal in size and structure. Venous: The inferior vena cava is normal in size with less than 50% respiratory variability, suggesting right atrial pressure of 8 mmHg. IAS/Shunts: No atrial level shunt detected by color flow Doppler.  LEFT VENTRICLE PLAX  2D LVIDd:         3.50 cm   Diastology LVIDs:         2.70 cm   LV e' medial:    4.68 cm/s LV PW:         1.10 cm   LV E/e' medial:  23.5 LV IVS:        1.50 cm   LV e' lateral:   6.74 cm/s LVOT diam:     1.90 cm   LV E/e' lateral:  16.3 LVOT Area:     2.84 cm  RIGHT VENTRICLE RV Basal diam:  2.60 cm RV Mid diam:    2.00 cm RV S prime:     17.00 cm/s TAPSE (M-mode): 1.3 cm LEFT ATRIUM             Index        RIGHT ATRIUM           Index LA Vol (A2C):   59.5 ml 37.32 ml/m  RA Area:     12.70 cm LA Vol (A4C):   69.2 ml 43.41 ml/m  RA Volume:   28.00 ml  17.56 ml/m LA Biplane Vol: 65.1 ml 40.83 ml/m   AORTA Ao Root diam: 2.60 cm Ao Asc diam:  2.70 cm MITRAL VALVE                TRICUSPID VALVE MV Area (PHT): 3.48 cm     TR Peak grad:   47.6 mmHg MV Decel Time: 218 msec     TR Vmax:        345.00 cm/s MV E velocity: 110.00 cm/s MV A velocity: 118.00 cm/s  SHUNTS MV E/A ratio:  0.93         Systemic Diam: 1.90 cm Dalton McleanMD Electronically signed by Archer Bear Signature Date/Time: 08/18/2023/2:32:04 PM    Final    CT ABDOMEN PELVIS W CONTRAST Result Date: 08/17/2023 EXAM: CT ABDOMEN AND PELVIS WITH CONTRAST 08/17/2023 08:49:42 PM TECHNIQUE: CT of the abdomen and pelvis was performed with the administration of intravenous contrast. Multiplanar reformatted images are provided for review. Automated exposure control, iterative reconstruction, and/or weight based adjustment of the mA/kV was utilized to reduce the radiation dose to as low as reasonably achievable. COMPARISON: 03/02/2023 CLINICAL HISTORY: Abdominal pain, acute, nonlocalized. Table formatting from the original note was not included. Images from the original note were not included. Pt. BIBEMS from home w/ c/o a fall. No LOC. Pt is on eliquis  BID. Pt. C/o pain and tenderness to her left elbow. Pt. States she had first txt for bladder cancer last Tuesday and has been feeling weak and has had chills since. Per EMS stroke screen; negative. FINDINGS: LOWER CHEST: Mild subpleural reticulation/fibrosis at the lung bases. LIVER: The liver is unremarkable. GALLBLADDER AND BILE DUCTS: Gallbladder is unremarkable. No biliary ductal dilatation. SPLEEN: No acute abnormality. PANCREAS: No  acute abnormality. ADRENAL GLANDS: No acute abnormality. KIDNEYS, URETERS AND BLADDER: Simple left upper pole renal cyst measuring up to 18 mm, benign. No follow-up is recommended. Mild diminished/delayed enhancement of the right kidney with associated mild right hydroureteronephrosis, although without a visualized ureteral or bladder calculus. Differential consideration to include recent passage of a ureteral calculus or ascending infection. Trace nondependent gas in the bladder with subtle perivesical stranding, correlate for cystitis. GI AND BOWEL: The appendix is not discretely visualized. PERITONEUM AND RETROPERITONEUM: No ascites. No free air. VASCULATURE: Atherosclerotic calcifications of the abdominal aorta and branch vessels. LYMPH NODES: No lymphadenopathy.  REPRODUCTIVE ORGANS: No acute abnormality. BONES AND SOFT TISSUES: No acute osseous abnormality. No focal soft tissue abnormality. IMPRESSION: 1. Trace nondependent gas in the bladder and subtle perivesical stranding, correlate for cystitis. 2. Mild diminished/delayed enhancement of the right kidney with associated mild right hydroureteronephrosis. Differential considerations include recent passage of a ureteral calculus or ascending infection. Electronically signed by: Zadie Herter MD 08/17/2023 08:57 PM EDT RP Workstation: [id number]   DG Chest Port 1 View Result Date: 08/17/2023 CLINICAL DATA:  Pain. Fall. History of recent treatment for bladder cancer. Weakness and chills. EXAM: PORTABLE CHEST 1 VIEW COMPARISON:  07/24/2022 FINDINGS: Cardiac enlargement with mild pulmonary vascular congestion. Perihilar and basilar infiltrates are new since prior study, likely edema. Pneumonia would also be a possibility. No pleural effusion or pneumothorax. Mediastinal contours appear intact. Calcification of the aorta. IMPRESSION: Cardiac enlargement with pulmonary vascular congestion and bilateral pulmonary infiltrates. Findings are new since prior study.  Electronically Signed   By: Boyce Byes M.D.   On: 08/17/2023 19:41   DG Elbow Complete Left Result Date: 08/17/2023 CLINICAL DATA:  Pain after a fall. Left posterior elbow pain after a fall today. EXAM: LEFT ELBOW - COMPLETE 3+ VIEW COMPARISON:  Left forearm 08/27/2021 FINDINGS: Degenerative changes in the left elbow. No evidence of acute fracture or dislocation. No focal bone lesion or bone destruction. Bone cortex appears intact. No significant effusions. Soft tissue defect over the olecranon region consistent with laceration. No radiopaque soft tissue foreign bodies. Soft tissue calcification projected over the forearm, likely dystrophic. IMPRESSION: No acute bony abnormalities. Soft tissue defect over the olecranon region consistent with laceration. Electronically Signed   By: Boyce Byes M.D.   On: 08/17/2023 17:29    Labs: BNP (last 3 results) Recent Labs    08/17/23 2025  BNP 459.6*   Basic Metabolic Panel: Recent Labs  Lab 08/21/23 0419 08/21/23 0420 08/22/23 0348 08/23/23 0418 08/24/23 0351 08/25/23 0410 08/25/23 0411 08/26/23 0417 08/26/23 0418  NA  --    < > 133* 136 139 138  --   --  138  K  --    < > 3.0* 3.6 3.0* 3.5  --   --  3.6  CL  --    < > 103 104 105 102  --   --  102  CO2  --    < > 22 25 25 27   --   --  26  GLUCOSE  --    < > 104* 93 97 89  --   --  84  BUN  --    < > 18 15 14 14   --   --  11  CREATININE  --    < > 1.04* 1.16* 1.04* 1.11* 1.06*  --  1.25*  CALCIUM   --    < > 8.4* 8.7* 8.8* 8.9  --   --  9.1  MG 1.8  --  1.8  --   --   --   --   --   --   PHOS 2.6  --   --   --   --   --   --  4.4  --    < > = values in this interval not displayed.   Liver Function Tests: Recent Labs  Lab 08/24/23 0351 08/25/23 0410  AST 35 35  ALT 36 35  ALKPHOS 65 68  BILITOT 0.6 0.4  PROT 5.4* 5.6*  ALBUMIN 2.4* 2.5*   No results for input(s): LIPASE,  AMYLASE in the last 168 hours. No results for input(s): AMMONIA in the last 168  hours. CBC: Recent Labs  Lab 08/21/23 0420 08/22/23 0348 08/23/23 0418 08/24/23 0351 08/26/23 0418  WBC 8.1 7.6 7.2 7.6 9.0  HGB 10.0* 10.0* 10.4* 10.4* 10.4*  HCT 30.6* 29.6* 31.8* 31.0* 32.6*  MCV 95.9 93.7 95.8 94.5 98.2  PLT 162 203 277 327 378   Cardiac Enzymes: No results for input(s): CKTOTAL, CKMB, CKMBINDEX, TROPONINI in the last 168 hours. BNP: Invalid input(s): POCBNP CBG: No results for input(s): GLUCAP in the last 168 hours. D-Dimer No results for input(s): DDIMER in the last 72 hours. Hgb A1c No results for input(s): HGBA1C in the last 72 hours. Lipid Profile No results for input(s): CHOL, HDL, LDLCALC, TRIG, CHOLHDL, LDLDIRECT in the last 72 hours. Thyroid function studies No results for input(s): TSH, T4TOTAL, T3FREE, THYROIDAB in the last 72 hours.  Invalid input(s): FREET3 Anemia work up No results for input(s): VITAMINB12, FOLATE, FERRITIN, TIBC, IRON, RETICCTPCT in the last 72 hours. Urinalysis    Component Value Date/Time   COLORURINE YELLOW 08/18/2023 0848   APPEARANCEUR CLEAR 08/18/2023 0848   LABSPEC 1.042 (H) 08/18/2023 0848   PHURINE 6.0 08/18/2023 0848   GLUCOSEU NEGATIVE 08/18/2023 0848   HGBUR SMALL (A) 08/18/2023 0848   BILIRUBINUR NEGATIVE 08/18/2023 0848   KETONESUR 5 (A) 08/18/2023 0848   PROTEINUR NEGATIVE 08/18/2023 0848   NITRITE NEGATIVE 08/18/2023 0848   LEUKOCYTESUR SMALL (A) 08/18/2023 0848   Sepsis Labs Recent Labs  Lab 08/22/23 0348 08/23/23 0418 08/24/23 0351 08/26/23 0418  WBC 7.6 7.2 7.6 9.0   Microbiology Recent Results (from the past 240 hours)  Urine Culture     Status: Abnormal   Collection Time: 08/17/23  3:45 PM   Specimen: Urine, Clean Catch  Result Value Ref Range Status   Specimen Description   Final    URINE, CLEAN CATCH Performed at [facility], [address] [place], [place] [address]    Special Requests   Final     NONE Performed at [facility], [address], [place], [place] [address]    Culture >=100,000 COLONIES/mL ESCHERICHIA COLI (A)  Final   Report Status 08/19/2023 FINAL  Final   Organism ID, Bacteria ESCHERICHIA COLI (A)  Final      Susceptibility   Escherichia coli - MIC*    AMPICILLIN 4 SENSITIVE Sensitive     CEFAZOLIN  <=4 SENSITIVE Sensitive     CEFEPIME <=0.12 SENSITIVE Sensitive     CEFTRIAXONE <=0.25 SENSITIVE Sensitive     CIPROFLOXACIN <=0.25 SENSITIVE Sensitive     GENTAMICIN <=1 SENSITIVE Sensitive     IMIPENEM <=0.25 SENSITIVE Sensitive     NITROFURANTOIN  <=16 SENSITIVE Sensitive     TRIMETH/SULFA <=20 SENSITIVE Sensitive     AMPICILLIN/SULBACTAM <=2 SENSITIVE Sensitive     PIP/TAZO <=4 SENSITIVE Sensitive ug/mL    * >=100,000 COLONIES/mL ESCHERICHIA COLI  Culture, blood (Routine X 2) w Reflex to ID Panel     Status: None   Collection Time: 08/18/23 12:39 PM   Specimen: BLOOD RIGHT ARM  Result Value Ref Range Status   Specimen Description   Final    BLOOD RIGHT ARM Performed at [facility], [address] [place], [place] [address]    Special Requests   Final    BOTTLES DRAWN AEROBIC AND ANAEROBIC Blood Culture adequate volume Performed at [facility], [address] [place], [place] [address]  Culture   Final    NO GROWTH 5 DAYS Performed at [facility], [address] [place], [place] [address]    Report Status 08/23/2023 FINAL  Final  Culture, blood (Routine X 2) w Reflex to ID Panel     Status: None   Collection Time: 08/18/23 12:41 PM   Specimen: BLOOD  Result Value Ref Range Status   Specimen Description   Final    BLOOD BLOOD RIGHT ARM Performed at [facility], [address] [place], [place] [address]    Special Requests   Final    BOTTLES DRAWN AEROBIC AND ANAEROBIC Blood Culture adequate volume Performed at [facility], [address],  [place], [place] [address]    Culture   Final    NO GROWTH 5 DAYS Performed at [facility], [address], [place], [place] [address]    Report Status 08/23/2023 FINAL  Final  Surgical PCR screen     Status: Abnormal   Collection Time: 08/20/23  1:15 AM   Specimen: Nasal Mucosa; Nasal Swab  Result Value Ref Range Status   MRSA, PCR NEGATIVE NEGATIVE Final   Staphylococcus aureus POSITIVE (A) NEGATIVE Final    Comment: (NOTE) The Xpert SA Assay (FDA approved for NASAL specimens in patients 39 years of age and older), is one component of a comprehensive surveillance program. It is not intended to diagnose infection nor to guide or monitor treatment. Performed at [facility], [address] [place], [place] [address]     Time coordinating discharge: 35  minutes  SIGNED: Lesa Rape, MD  [facility] 08/27/2023, 9:50 AM  If 7PM-7AM, please contact night-coverage [email or web address]

## 2023-08-27 NOTE — Progress Notes (Signed)
 PROGRESS NOTE Laurie Bowman  MWU:[id number] DOB: [birth year] DOA: 08/17/2023 PCP: Sheilah Denver., MD  Brief Narrative/Hospital Course: 83 y.o. female with history of hypertrophic cardiomyopathy, paroxysmal atrial fibrillation, hyperlipidemia, recently diagnosed bladder cancer underwent BCG treatment last week at [facility] since then has been feeling weak tired fatigued poor appetite and also has been having increasing pain on micturition.  Had subjective feeling of fever chills.  Patient felt so weak today that she slid out of the bed did not hit her head or lose consciousness.  She's been admitted with general malaise, AKI.  CT with findings possibly concerning for a UTI.  Cardiology following for afib with RVR, elevated troponin.  Urology planning for stent-attempted but unsuccessful> subsequently nephrostomy catheter placed by IR on the right IR has cleared her for discharge >No additional VIR intervention during current hospitalization. She will return for routine exchange, possible internalization in 6-8wks. some abdominal discomfort CT abdomen 6/9 no new changes.  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  Patient has been approved for skilled nursing facility and has a bed available.  Subjective: Seen examined Aaox3 Feels better  Stool not as liquidy Overnight afebrile BP stable BM x 2 charted Feels ready to go to snf today> but was having nausea and bloating-Zofran  given, discharge held  Assessment and plan   E coli UTI  Possible Pyelonephritis Right Sided Hydronephrosis Generalized Malaise  CT with trace gas in bladder and subtle perivesical stranding, mild right hydroureteronephrosis  Pansensitive e. Coli on urine culture. Blood culture NGTD> managed w/ Augmentin . Urology planned  stent-but unsuccessful as could not find the orifice> subsequently nephrostomy catheter placed by IR  Back on  Eliquis  after discussion with IR 6/9. Okay for discharge plan to  follow-up with IR in 6 to 8 weeks for possible internalization   Hypokalemia: Hyponatremia Hypophosphatemia Hypomagnesemia: Electrolytes stable/ cont her kdur daily at snf  Normocytic anemia: Hemoglobin holding ~ 10 to 11 g range.  Continue to monitor   A-fib with RVR  Patient had RVR prior to procedure, TSH normal amiodarone  transitioned to p.o, Eliquis , verapamil .   History of IBS-abdominal cramps/distention/ liquid stool/Nausea: CT abdomen 6/9 no acute finding continue symptomatic management.  No leukocytosis fever, have bloating, nausea continue symptomatic management will obtain labs this afternoon, if remains symptomatic will need IV access and IV fluids.  AKI on CKD 3a: Baseline creatinine as low as 0.9, creatinine peaked at 1.4 indicating AKI-likely from poor oral intake and dehydration,?Obstructive uropathy creatinine has improved. CT with mild R hydroureteronephrosis (possibly with recent passage of stone or infection). Check labs today Recent Labs    08/17/23 1612 08/18/23 0034 08/19/23 0358 08/20/23 0415 08/21/23 0420 08/22/23 0348 08/23/23 0418 08/24/23 0351 08/25/23 0410 08/25/23 0411 08/26/23 0418  BUN 24* 24* 23 17 15 18 15 14 14   --  11  CREATININE 1.46* 1.43* 1.18* 1.18* 0.99 1.04* 1.16* 1.04* 1.11* 1.06* 1.25*  CO2 21* 21* 22 21* 22 22 25 25 27   --  26  K 3.6 3.4* 3.3* 3.2* 3.0* 3.0* 3.6 3.0* 3.5  --  3.6    Acute Metabolic Encephalopathy  Suspect due to above-with UTI dehydration.  Mentation improved to baseline AAO x 3   Elevated troponin: Seen by cardiology echo done suspect demand ischemia   Mild rhabdomyolysis  Elevated CK:  CK improved, continue statin   Bladder cancer S/p TURBT 06/2023, intravesicular BCG last week    Hyperlipidemia: Resime statins   Hypertension Stable continue verapamil . resume  thiazide as BP trending up. Cont holding minoxidil    Hypertrophic cardiomyopathy  Caution with volume    Depression Stable on  Wellbutrin .  Deconditioning/debility: Continue PT OT plan for skilled nursing.   DVT prophylaxis:  Lovenox  Code Status:   Code Status: Full Code Family Communication: plan of care discussed with patient at bedside. Patient status is: Remains hospitalized because of severity of illness Level of care: Telemetry   Dispo: The patient is from: home            Anticipated disposition: Plan for SNF tomorrow if abdominal pain better  Objective: Vitals last 24 hrs: Vitals:   08/26/23 0425 08/26/23 1300 08/26/23 1900 08/27/23 0546  BP: (!) 141/69 (!) 155/72 (!) 157/66 (!) 156/76  Pulse: 62 62 (!) 56 (!) 56  Resp: 19   16  Temp: 98.2 F (36.8 C) 98.6 F (37 C) 98.1 F (36.7 C) 98.2 F (36.8 C)  TempSrc: Oral Oral Oral Oral  SpO2: 95% 100% 99% 96%  Weight:      Height:        Physical Examination: General exam: alert awake, oriented at baseline, older than stated age HEENT:Oral mucosa moist, Ear/Nose WNL grossly Respiratory system: Bilaterally clear BS,no use of accessory muscle Cardiovascular system: S1 & S2 +, No JVD. Gastrointestinal system: Abdomen soft, mild diffuse tenderness without guarding or rigidity, BS+ Nervous System: Alert, awake, moving all extremities,and following commands. Extremities: LE edema neg,distal peripheral pulses palpable and warm.  Skin: No rashes,no icterus. MSK: Normal muscle bulk,tone, power   Data Reviewed: I have personally reviewed following labs and imaging studies ( see epic result tab) CBC: Recent Labs  Lab 08/21/23 0420 08/22/23 0348 08/23/23 0418 08/24/23 0351 08/26/23 0418  WBC 8.1 7.6 7.2 7.6 9.0  HGB 10.0* 10.0* 10.4* 10.4* 10.4*  HCT 30.6* 29.6* 31.8* 31.0* 32.6*  MCV 95.9 93.7 95.8 94.5 98.2  PLT 162 203 277 327 378   CMP: Recent Labs  Lab 08/21/23 0419 08/21/23 0420 08/22/23 0348 08/23/23 0418 08/24/23 0351 08/25/23 0410 08/25/23 0411 08/26/23 0417 08/26/23 0418  NA  --    < > 133* 136 139 138  --   --  138  K   --    < > 3.0* 3.6 3.0* 3.5  --   --  3.6  CL  --    < > 103 104 105 102  --   --  102  CO2  --    < > 22 25 25 27   --   --  26  GLUCOSE  --    < > 104* 93 97 89  --   --  84  BUN  --    < > 18 15 14 14   --   --  11  CREATININE  --    < > 1.04* 1.16* 1.04* 1.11* 1.06*  --  1.25*  CALCIUM   --    < > 8.4* 8.7* 8.8* 8.9  --   --  9.1  MG 1.8  --  1.8  --   --   --   --   --   --   PHOS 2.6  --   --   --   --   --   --  4.4  --    < > = values in this interval not displayed.   GFR: Estimated Creatinine Clearance: 29.1 mL/min (A) (by C-G formula based on SCr of 1.25 mg/dL (H)). Recent Labs  Lab 08/24/23  0351 08/25/23 0410  AST 35 35  ALT 36 35  ALKPHOS 65 68  BILITOT 0.6 0.4  PROT 5.4* 5.6*  ALBUMIN 2.4* 2.5*    No results for input(s): LIPASE, AMYLASE in the last 168 hours.  No results for input(s): AMMONIA in the last 168 hours. Coagulation Profile:  Recent Labs  Lab 08/21/23 1021  INR 1.5*   Unresulted Labs (From admission, onward)     Start     Ordered   08/27/23 1151  CBC  ONCE - STAT,   STAT       Question:  Specimen collection method  Answer:  Lab=Lab collect   08/27/23 1150   08/27/23 1151  Comprehensive metabolic panel  ONCE - STAT,   STAT       Question:  Specimen collection method  Answer:  Lab=Lab collect   08/27/23 1150   08/26/23 0500  Basic metabolic panel with GFR  Every 48 hours,   R     Question:  Specimen collection method  Answer:  Lab=Lab collect   08/24/23 1052   08/26/23 0500  CBC  Every 48 hours,   R     Question:  Specimen collection method  Answer:  Lab=Lab collect   08/24/23 1052   08/21/23 1500  Urine Culture (for pregnant, neutropenic or urologic patients or patients with an indwelling urinary catheter)  (Urine Culture)  Once,   R       Comments: R PCN access   Question:  Indication  Answer:  Flank Pain   08/21/23 1500           Antimicrobials/Microbiology: Anti-infectives (From admission, onward)    Start     Dose/Rate Route  Frequency Ordered Stop   08/27/23 0000  amoxicillin -clavulanate (AUGMENTIN ) 500-125 MG tablet        1 tablet Oral 2 times daily 08/27/23 0948 08/29/23 2359   08/26/23 1000  amoxicillin -clavulanate (AUGMENTIN ) 500-125 MG per tablet 1 tablet        1 tablet Oral 2 times daily 08/26/23 0731     08/21/23 1400  levofloxacin  (LEVAQUIN ) IVPB 500 mg        500 mg 100 mL/hr over 60 Minutes Intravenous  Once 08/21/23 1021 08/21/23 1620   08/20/23 1600  ceFAZolin  (ANCEF ) IVPB 2g/100 mL premix        2 g 200 mL/hr over 30 Minutes Intravenous  Once 08/20/23 1558 08/20/23 1630   08/20/23 1559  ceFAZolin  (ANCEF ) 2-4 GM/100ML-% IVPB       Note to Pharmacy: Gaines, Janel R: cabinet override      08/20/23 1559 08/20/23 1623   08/19/23 2200  amoxicillin -clavulanate (AUGMENTIN ) 875-125 MG per tablet 1 tablet  Status:  Discontinued       Note to Pharmacy: Retime as appropriate based on last levaquin  dose   1 tablet Oral Every 12 hours 08/19/23 1604 08/26/23 0731   08/19/23 1800  levofloxacin  (LEVAQUIN ) IVPB 750 mg  Status:  Discontinued        750 mg 100 mL/hr over 90 Minutes Intravenous Every 48 hours 08/17/23 2305 08/18/23 0310   08/19/23 1800  levofloxacin  (LEVAQUIN ) IVPB 500 mg  Status:  Discontinued        500 mg 100 mL/hr over 60 Minutes Intravenous Every 48 hours 08/18/23 0310 08/19/23 1604   08/19/23 0730  ceFAZolin  (ANCEF ) IVPB 2g/100 mL premix  Status:  Discontinued        2 g 200 mL/hr over 30 Minutes Intravenous  Once 08/19/23  9604 08/19/23 1300   08/19/23 0704  ceFAZolin  (ANCEF ) 2-4 GM/100ML-% IVPB  Status:  Discontinued       Note to Pharmacy: Sim Dryer: cabinet override      08/19/23 0704 08/19/23 1241   08/17/23 1815  levofloxacin  (LEVAQUIN ) IVPB 500 mg        500 mg 100 mL/hr over 60 Minutes Intravenous  Once 08/17/23 1808 08/17/23 1919         Component Value Date/Time   SDES  08/18/2023 1241    BLOOD BLOOD RIGHT ARM Performed at [facility], [address] [place], [place] [address]    SPECREQUEST  08/18/2023 1241    BOTTLES DRAWN AEROBIC AND ANAEROBIC Blood Culture adequate volume Performed at [facility], [address], [place], [place] [address]    CULT  08/18/2023 1241    NO GROWTH 5 DAYS Performed at [facility], [address] [place], [place] [address]    REPTSTATUS 08/23/2023 FINAL 08/18/2023 1241    Procedures: Procedure(s) (LRB): CYSTOSCOPY, WITH RETROGRADE PYELOGRAM AND URETERAL STENT INSERTION (Right) Medications reviewed:  Scheduled Meds:  amiodarone   200 mg Oral BID   [START ON 08/29/2023] amiodarone   200 mg Oral Daily   amoxicillin -clavulanate  1 tablet Oral BID   apixaban   5 mg Oral BID   buPROPion   150 mg Oral Daily   feeding supplement  237 mL Oral BID BM   pantoprazole   40 mg Oral Daily   rosuvastatin   10 mg Oral Daily   saccharomyces boulardii  250 mg Oral BID   sodium chloride  flush  5 mL Intracatheter Q8H   verapamil   120 mg Oral Daily   Continuous Infusions:  magnesium  sulfate bolus IVPB      Teancum Brule, MD [facility] 08/27/2023, 11:51 AM

## 2023-08-28 DIAGNOSIS — I10 Essential (primary) hypertension: Secondary | ICD-10-CM | POA: Diagnosis not present

## 2023-08-28 DIAGNOSIS — N179 Acute kidney failure, unspecified: Secondary | ICD-10-CM | POA: Diagnosis not present

## 2023-08-28 DIAGNOSIS — C679 Malignant neoplasm of bladder, unspecified: Secondary | ICD-10-CM | POA: Diagnosis not present

## 2023-08-28 DIAGNOSIS — N1330 Unspecified hydronephrosis: Secondary | ICD-10-CM | POA: Diagnosis not present

## 2023-08-28 DIAGNOSIS — I480 Paroxysmal atrial fibrillation: Secondary | ICD-10-CM | POA: Diagnosis not present

## 2023-08-28 LAB — BASIC METABOLIC PANEL WITH GFR
Anion gap: 7 (ref 5–15)
BUN: 10 mg/dL (ref 8–23)
CO2: 26 mmol/L (ref 22–32)
Calcium: 9.2 mg/dL (ref 8.9–10.3)
Chloride: 103 mmol/L (ref 98–111)
Creatinine, Ser: 1.18 mg/dL — ABNORMAL HIGH (ref 0.44–1.00)
GFR, Estimated: 46 mL/min — ABNORMAL LOW (ref >60)
Glucose, Bld: 83 mg/dL (ref 70–99)
Potassium: 4 mmol/L (ref 3.5–5.1)
Sodium: 136 mmol/L (ref 135–145)

## 2023-08-28 LAB — CBC
HCT: 33.9 % — ABNORMAL LOW (ref 36.0–46.0)
Hemoglobin: 11 g/dL — ABNORMAL LOW (ref 12.0–15.0)
MCH: 31.1 pg (ref 26.0–34.0)
MCHC: 32.4 g/dL (ref 30.0–36.0)
MCV: 95.8 fL (ref 80.0–100.0)
Platelets: 381 10*3/uL (ref 150–400)
RBC: 3.54 MIL/uL — ABNORMAL LOW (ref 3.87–5.11)
RDW: 13.3 % (ref 11.5–15.5)
WBC: 8.2 10*3/uL (ref 4.0–10.5)
nRBC: 0 % (ref 0.0–0.2)

## 2023-08-28 MED ORDER — AMIODARONE HCL 200 MG PO TABS: 200.0000 mg | ORAL_TABLET | Freq: Every day | ORAL | Status: DC

## 2023-08-28 NOTE — Care Management Important Message (Signed)
 Important Message  Patient Details IM Letter given. Name: Laurie Bowman MRN: [id number] Date of Birth: 14-Aug-1940   Important Message Given:  Yes - Medicare IM     Laurie Bowman 08/28/2023, 11:26 AM

## 2023-08-28 NOTE — Discharge Summary (Signed)
 Laurie Bowman:[id number] DOB: [birth year] DOA: 08/17/2023  PCP: Sheilah Denver., MD  Admit date: 08/17/2023 Discharge date: 08/28/2023  Time spent: 35 minutes  Recommendations for Outpatient Follow-up:  Pcp f/u IR f/u 7/15 as scheduled Cardiology f/u later this month as scheduled Urology follow-up Check kidney function and electrolytes in one week     Discharge Diagnoses:  Principal Problem:   ARF (acute renal failure) (HCC) Active Problems:   Rhabdomyolysis   Hypertension   Hypertrophic cardiomyopathy (HCC)   Allergic rhinitis   Atrial fibrillation with RVR (HCC)   Elevated troponin   Acute UTI   Hyponatremia   Generalized weakness   Discharge Condition: stable  Diet recommendation: heart healthy  Filed Weights   08/17/23 1532 08/20/23 1358  Weight: 62.6 kg 62.6 kg    History of present illness:  From admission h and p: Laurie Bowman is a 83 y.o. female with history of hypertrophic cardiomyopathy, paroxysmal atrial fibrillation, hyperlipidemia, recently diagnosed bladder cancer underwent BCG treatment last week at [facility] since then has been feeling weak tired fatigued poor appetite and also has been having increasing pain on micturition.  Had subjective feeling of fever chills.  Patient felt so weak today that she slid out of the bed did not hit her head or lose consciousness.  Did hit her left elbow.  Patient states recently her verapamil  dose was increased from 120 mg by her primary care physician.   Hospital Course:   83 y.o. female with history of hypertrophic cardiomyopathy, paroxysmal atrial fibrillation, hyperlipidemia, recently diagnosed bladder cancer underwent BCG treatment last week at [facility] since then has been feeling weak tired fatigued poor appetite and also has been having increasing pain on micturition.  Had subjective feeling of fever chills.  Patient felt so weak today that she slid out of the bed did not hit her head or lose consciousness.   She's been admitted with general malaise, AKI.  CT with findings possibly concerning for a UTI.  Cardiology following for afib with RVR, elevated troponin.  Urology planning for stent-attempted but unsuccessful> subsequently nephrostomy catheter placed by IR on the right IR has cleared her for discharge >No additional VIR intervention during current hospitalization. She will return for routine exchange, possible internalization in 6-8wks. some abdominal discomfort CT abdomen 6/9 no new changes.  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  Patient has been approved for skilled nursing facility and has a bed available.   E coli UTI  Possible Pyelonephritis Right Sided Hydronephrosis Generalized Malaise  CT with trace gas in bladder and subtle perivesical stranding, mild right hydroureteronephrosis  Pansensitive e. Coli on urine culture. Blood culture NGTD> managed w/ Augmentin , completed a 10 day course in the hospital Urology planned  stent-but unsuccessful as could not find the orifice> subsequently nephrostomy catheter placed by IR  Back on  Eliquis  after discussion with IR 6/9. Okay for discharge plan to follow-up with IR in one month   Hypokalemia: Hyponatremia Hypophosphatemia Hypomagnesemia: Electrolytes stable/ cont her kdur daily at snf   Normocytic anemia: Hemoglobin holding ~ 10 to 11 g range.      A-fib with RVR  Patient had RVR prior to procedure, TSH normal amiodarone  transitioned to p.o, Eliquis , verapamil . Has cardiology f/u scheduled end of this month   History of IBS-abdominal cramps/distention/ liquid stool/Nausea: CT abdomen 6/9 no acute finding continue symptomatic management.  Feeling well day of discharge   AKI on CKD 3a: Baseline creatinine as low as 0.9,  creatinine peaked at 1.4 indicating AKI-likely from poor oral intake and dehydration,?Obstructive uropathy creatinine has improved. CT with mild R hydroureteronephrosis (possibly with  recent passage of stone or infection). Now stable cr in low 1s   Acute Metabolic Encephalopathy  resolved   Elevated troponin: Seen by cardiology echo done suspect demand ischemia    Mild rhabdomyolysis  Elevated CK:  CK improved, continue statin    Bladder cancer S/p TURBT 06/2023, intravesicular BCG last week  F/u urology outpt   Hyperlipidemia: Resime statins   Hypertension Stable continue verapamil . resume thiazide as BP trending up. Cont holding minoxidil    Hypertrophic cardiomyopathy  Caution with volume    Depression Stable on Wellbutrin .   Deconditioning/debility: To SNF  Procedures: 6/5: RIGHT NEPHROURETERAL TUBE PLACEMENT RIGHT ANTEROGRADE NEPHROSTOGRAM  6/4: Cystoscopy Failed ureteral stent placement  Consultations: Urology, IR, cardiology  Discharge Exam: Vitals:   08/28/23 0418 08/28/23 0419  BP:  (!) 157/70  Pulse:  (!) 56  Resp: 14 18  Temp: 98.2 F (36.8 C)   SpO2:  98%    General: NAD Cardiovascular: RR Respiratory: CTAB Right nephrostomy draining clear yellow urine Abdomen soft, non-tender  Discharge Instructions   Discharge Instructions     Diet - low sodium heart healthy   Complete by: As directed    Discharge instructions   Complete by: As directed    Please call call MD or return to ER for similar or worsening recurring problem that brought you to hospital or if any fever,nausea/vomiting,abdominal pain, uncontrolled pain, chest pain,  shortness of breath or any other alarming symptoms.  Please follow-up your doctor as instructed in a week time and call the office for appointment.  Please avoid alcohol, smoking, or any other illicit substance and maintain healthy habits including taking your regular medications as prescribed.  You were cared for by a hospitalist during your hospital stay. If you have any questions about your discharge medications or the care you received while you were in the hospital after you are  discharged, you can call the unit and ask to speak with the hospitalist on call if the hospitalist that took care of you is not available.  Once you are discharged, your primary care physician will handle any further medical issues. Please note that NO REFILLS for any discharge medications will be authorized once you are discharged, as it is imperative that you return to your primary care physician (or establish a relationship with a primary care physician if you do not have one) for your aftercare needs so that they can reassess your need for medications and monitor your lab values   Discharge wound care:   Complete by: As directed    Change dressing every 3 days   Increase activity slowly   Complete by: As directed    Increase activity slowly   Complete by: As directed    No wound care   Complete by: As directed       Allergies as of 08/28/2023       Reactions   Cefaclor Itching   Other reaction(s): Flushing (ALLERGY/intolerance)   Codeine Itching, Nausea And Vomiting   Fructose Swelling, Nausea And Vomiting   Swelling and GI upset, Fructose Granules Fructose Granules   Sulfa Antibiotics Swelling   Meperidine Nausea And Vomiting, Itching   Elemental Sulfur Swelling   Propoxyphene Nausea And Vomiting        Medication List     STOP taking these medications    minoxidil 2.5  MG tablet Commonly known as: LONITEN       TAKE these medications    acetaminophen  500 MG tablet Commonly known as: TYLENOL  Take 500 mg by mouth every 4 (four) hours as needed for moderate pain (pain score 4-6).   albuterol  (2.5 MG/3ML) 0.083% nebulizer solution Commonly known as: PROVENTIL  Take 3 mLs (2.5 mg total) by nebulization every 6 (six) hours as needed for wheezing or shortness of breath.   albuterol  108 (90 Base) MCG/ACT inhaler Commonly known as: VENTOLIN  HFA USE 1-2 PUFFS EVERY 4-6 HOURS AS NEEDED FOR COUGH OR WHEEZE.   amiodarone  200 MG tablet Commonly known as: PACERONE  Take  1 tablet (200 mg total) by mouth daily. Start taking on: August 29, 2023   apixaban  5 MG Tabs tablet Commonly known as: Eliquis  Take 1 tablet (5 mg total) by mouth 2 (two) times daily.   augmented betamethasone dipropionate 0.05 % cream Commonly known as: DIPROLENE-AF Apply 0.05 application  topically daily as needed (Lichen).   benzonatate  100 MG capsule Commonly known as: TESSALON  Take 100 mg by mouth daily as needed for cough.   buPROPion  150 MG 24 hr tablet Commonly known as: WELLBUTRIN  XL Take 150 mg by mouth daily.   clobetasol cream 0.05 % Commonly known as: TEMOVATE Apply 1 application  topically daily as needed (Scratches).   clotrimazole-betamethasone cream Commonly known as: LOTRISONE Apply 1 application  topically daily as needed (Fugus).   diclofenac Sodium 1 % Gel Commonly known as: VOLTAREN Apply 2 g topically 4 (four) times daily as needed (Pain). Both knees   dicyclomine  20 MG tablet Commonly known as: BENTYL  Take 20 mg by mouth daily as needed for spasms.   feeding supplement Liqd Take 237 mLs by mouth 2 (two) times daily between meals.   fexofenadine 180 MG tablet Commonly known as: ALLEGRA Take 180 mg by mouth daily.   fluticasone  110 MCG/ACT inhaler Commonly known as: Flovent  HFA Use two puffs twice daily for one to two weeks during flares What changed:  how much to take how to take this when to take this reasons to take this additional instructions   hydrochlorothiazide  25 MG tablet Commonly known as: HYDRODIURIL  Take 1 tablet (25 mg total) by mouth daily.   lansoprazole  30 MG capsule Commonly known as: PREVACID  Take 1 capsule (30 mg total) by mouth 2 (two) times daily before a meal.   loperamide  2 MG capsule Commonly known as: IMODIUM  Take 1 capsule (2 mg total) by mouth as needed for diarrhea or loose stools (diarrhea).   mometasone  50 MCG/ACT nasal spray Commonly known as: NASONEX  Place 2 sprays into the nose daily. What  changed:  when to take this reasons to take this   montelukast  10 MG tablet Commonly known as: SINGULAIR  Take 1 tablet (10 mg total) by mouth at bedtime.   olopatadine  0.1 % ophthalmic solution Commonly known as: Patanol Place 1 drop into both eyes 2 (two) times daily as needed.   PEG 3350  17 GM/SCOOP Powd Take 17 g by mouth daily.   potassium chloride  10 MEQ tablet Commonly known as: KLOR-CON  M Take 10-20 mEq by mouth See admin instructions. Take 10 mEq (1 tablet) by mouth every morning and 20 mEq (2 tablets) every night.   rosuvastatin  10 MG tablet Commonly known as: CRESTOR  Take 10 mg by mouth daily.   saccharomyces boulardii 250 MG capsule Commonly known as: FLORASTOR Take 1 capsule (250 mg total) by mouth 2 (two) times daily for 14 days.  tiZANidine 4 MG tablet Commonly known as: ZANAFLEX Take 2 mg by mouth at bedtime as needed (Sleep).   triamcinolone cream 0.1 % Commonly known as: KENALOG Apply 1 application  topically 2 (two) times daily as needed (Eyes).   verapamil  120 MG CR tablet Commonly known as: CALAN -SR Take 1 tablet (120 mg total) by mouth daily. What changed: Another medication with the same name was removed. Continue taking this medication, and follow the directions you see here.   Vitamin D 50 MCG (2000 UT) Caps Take 2,000 Units by mouth daily.               Discharge Care Instructions  (From admission, onward)           Start     Ordered   08/28/23 0000  Discharge wound care:       Comments: Change dressing every 3 days   08/28/23 0941           Allergies  Allergen Reactions   Cefaclor Itching    Other reaction(s): Flushing (ALLERGY/intolerance)   Codeine Itching and Nausea And Vomiting   Fructose Swelling and Nausea And Vomiting    Swelling and GI upset, Fructose Granules  Fructose Granules   Sulfa Antibiotics Swelling   Meperidine Nausea And Vomiting and Itching   Elemental Sulfur Swelling   Propoxyphene Nausea  And Vomiting    Follow-up Information     Sheilah Denver., MD Follow up.   Specialty: Family Medicine Contact information: [address] [place] [address] [phone]                  The results of significant diagnostics from this hospitalization (including imaging, microbiology, ancillary and laboratory) are listed below for reference.    Significant Diagnostic Studies: DG Abd 1 View Result Date: 08/27/2023 CLINICAL DATA:  315176 Nausea AND vomiting [id number]. EXAM: ABDOMEN - 1 VIEW COMPARISON:  None Available. FINDINGS: No abnormal calcification noted overlying bilateral kidneys, ureters and urinary bladder region. The bowel gas pattern is non-obstructive. No evidence of pneumoperitoneum, within the limitations of a supine film. No acute osseous abnormalities. The soft tissues are within normal limits. Surgical changes, devices, tubes and lines: Right internal external percutaneously inserted nephroureteral tube noted. Other findings: None. IMPRESSION: Nonobstructive bowel gas pattern. Electronically Signed   By: Beula Brunswick M.D.   On: 08/27/2023 12:22   CT ABDOMEN PELVIS WO CONTRAST Result Date: 08/25/2023 CLINICAL DATA:  Postop abdominal pain. Recent cystoscopy with ureteral stent/nephroureteral catheter placement. EXAM: CT ABDOMEN AND PELVIS WITHOUT CONTRAST TECHNIQUE: Multidetector CT imaging of the abdomen and pelvis was performed following the standard protocol without IV contrast. RADIATION DOSE REDUCTION: This exam was performed according to the departmental dose-optimization program which includes automated exposure control, adjustment of the mA and/or kV according to patient size and/or use of iterative reconstruction technique. COMPARISON:  Abdominopelvic CT 08/17/2023 and 03/02/2023. FINDINGS: Lower chest: Stable subpleural scarring at both lung bases. New small right-greater-than-left pleural effusions. No basilar pneumothorax. Aortic and coronary artery  atherosclerosis noted. Hepatobiliary: The liver appears unremarkable as imaged in the noncontrast state. Dependent high density within the gallbladder lumen, likely vicarious excretion of contrast or sludge. No discrete gallstones, gallbladder wall thickening or biliary dilatation. Pancreas: Unremarkable. No pancreatic ductal dilatation or surrounding inflammatory changes. Spleen: Normal in size without focal abnormality. Adrenals/Urinary Tract: Both adrenal glands appear normal. Interval placement of a right percutaneous nephroureteral catheter extending distally into the urinary bladder. The right ureter appears decompressed. There  is minimal perinephric soft tissue stranding on the right without evidence of hematoma. No evidence of urinary tract calculus. Stable left renal cysts for which no specific follow-up imaging is recommended. The bladder is decompressed without apparent abnormality. Stomach/Bowel: Enteric contrast has passed into the distal colon. The stomach appears unremarkable for its degree of distension. No evidence of bowel wall thickening, distention or surrounding inflammatory change. The appendix is not visualized and may be surgically absent. There is no pericecal inflammation. Vascular/Lymphatic: Aortic and branch vessel atherosclerosis without evidence of aneurysm. No enlarged abdominopelvic lymph nodes identified. Reproductive: The uterus and ovaries appear unremarkable. No adnexal mass. Other: No evidence of abdominal wall mass or hernia. No ascites or pneumoperitoneum. Musculoskeletal: Increased subcutaneous edema in both flanks and buttocks without focal fluid collection or soft tissue emphysema. No acute osseous findings. Mild multilevel spondylosis. IMPRESSION: 1. Interval placement of a right percutaneous nephroureteral catheter extending distally into the urinary bladder. The right ureter appears decompressed. No evidence of urinary tract calculus or hydronephrosis. 2. No acute  abdominopelvic findings. 3. New small right-greater-than-left pleural effusions. Increased subcutaneous edema in both flanks and buttocks without focal fluid collection or soft tissue emphysema. 4.  Aortic Atherosclerosis (ICD10-I70.0). Electronically Signed   By: Elmon Hagedorn M.D.   On: 08/25/2023 17:11   IR  NEPHROURETERAL CATH PLACE RIGHT Result Date: 08/21/2023 INDICATION: Hydronephrosis. Distal ureteral obstruction. Failed retrograde stent placement EXAM: Procedures; 1.  RIGHT ANTEGRADE NEPHROSTOGRAM 2. ULTRASOUND AND FLUOROSCOPIC GUIDED RIGHT NEPHROURETERAL CATHETER PLACEMENT COMPARISON:  CT AP, 08/17/2023.  Or fluoroscopy, 08/20/2023. MEDICATIONS: Levofloxacin  500 mg IV; The antibiotic was administered in an appropriate time frame prior to skin puncture. ANESTHESIA/SEDATION: Moderate (conscious) sedation was employed during this procedure. A total of Versed  4 mg and Fentanyl  200 mcg was administered intravenously. Moderate Sedation Time: 44 minutes. The patient's level of consciousness and vital signs were monitored continuously by radiology nursing throughout the procedure under my direct supervision. CONTRAST:  70 mL Isovue 300 - administered into the renal collecting system FLUOROSCOPY: Radiation Exposure Index and estimated peak skin dose (PSD); Reference air kerma (RAK), 16 mGy. COMPLICATIONS: None immediate. PROCEDURE: Informed written consent was obtained from the patient after a discussion of the risks, benefits, and alternatives to treatment. The RIGHT flank region was prepped with sterile prep in a usual fashion, and a sterile drape was applied covering the operative field. A sterile gown and sterile gloves were used for the procedure. A timeout was performed prior to the initiation of the procedure. A pre procedural spot fluoroscopic image was obtained of the upper abdomen. Ultrasound scanning performed of the kidney was negative for significant hydronephrosis. As such, the stone within  inferior pole was targeted using a combination of ultrasound and fluoroscopically with a 22 gauge Chiba needle. Access to the collecting system was confirmed with advancement of a Nitrex wire into the collecting system. The needle was exchanged for the inner 3 Fr catheter from an Accustick set and contrast injection confirmed access. An Accustick set was utilized to dilate the tract and was subsequently exchanged for a Kumpe catheter over a Bentson wire. The Kumpe catheter was advanced down the ureter and into the urinary bladder. Next, over an Amplatz wire, the track was further dilated ultimately allowing placement of a 10 Fr, 22 cm percutaneous nephroureteral catheter with end coiled and locked within the urinary bladder. Contrast was injected and several spot fluoroscopic images were obtained in various obliquities. The catheter was secured at the skin entrance site with an  interrupted suture and a stat lock device and connected to a gravity bag. Dressings were applied. The patient tolerated procedure well without immediate postprocedural complication. FINDINGS: *Ultrasound scanning demonstrates a mildly dilated RIGHT collecting system. *Under a combination of ultrasound and fluoroscopic guidance, a posterior inferior calix was targeted, and access the urinary bladder obtained allowing placement of a 10 Fr, 26 cm percutaneous nephroureteral catheter with end coiled and locked within the urinary bladder. IMPRESSION: Successful placement of a RIGHT 10 Fr, 22 cm nephroureteral drainage catheter, with tip at the level of the urinary bladder. RECOMMENDATIONS: *The catheter was placed to gravity drainage, with anticipated capping trial after 24-48 hours. *The patient will return to Vascular Interventional Radiology (VIR) for routine catheter evaluation and and exchange, with possible ureteral stent conversion in 6-8 weeks. Art Largo, MD [facility] Radiology  Electronically Signed   By: Art Largo M.D.   On: 08/21/2023 17:23   DG C-Arm 1-60 Min-No Report Result Date: 08/20/2023 Fluoroscopy was utilized by the requesting physician.  No radiographic interpretation.   ECHOCARDIOGRAM COMPLETE Result Date: 08/18/2023    ECHOCARDIOGRAM REPORT   Patient Name:   Makalynn Islam Date of Exam: 08/18/2023 Medical Rec #:  [id number]    Height:       60.0 in Accession #:    [id number]   Weight:       138.0 lb Date of Birth:  01-23-1941    BSA:          1.594 m Patient Age:    83 years     BP:           100/52 mmHg Patient Gender: F            HR:           75 bpm. Exam Location:  Inpatient Procedure: 2D Echo, Cardiac Doppler and Color Doppler (Both Spectral and Color            Flow Doppler were utilized during procedure). Indications:    Elevated Troponin  History:        Patient has prior history of Echocardiogram examinations.                 Arrythmias:Atrial Fibrillation; Risk Factors:Hypertension and                 Dyslipidemia.  Sonographer:    Meagan Baucom RDCS, FE, PE Referring Phys: [id number] ARSHAD N KAKRAKANDY IMPRESSIONS  1. LV outflow tract peak gradient at rest 138 mmHg. Left ventricular ejection fraction, by estimation, is 50 to 55%. The left ventricle has low normal function. The left ventricle has no regional wall motion abnormalities but there is septal-lateral dyssynchrony consistent with LBBB. There is moderate asymmetric left ventricular hypertrophy of the septal segment. Left ventricular diastolic parameters are consistent with Grade I diastolic dysfunction (impaired relaxation).  2. Right ventricular systolic function is normal. The right ventricular size is normal. There is moderately elevated pulmonary artery systolic pressure. The estimated right ventricular systolic pressure is 55.6 mmHg.  3. Left atrial size was moderately dilated.  4. Mitral valve systolic anterior motion is present. The mitral valve is abnormal. Moderate eccentric mitral valve  regurgitation. No evidence of mitral stenosis.  5. The aortic valve is tricuspid. There is mild calcification of the aortic valve. Aortic valve regurgitation is not visualized. No aortic stenosis is present.  6. The inferior vena cava is normal in size with <50% respiratory variability, suggesting right atrial pressure of 8  mmHg.  7. 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 FINDINGS  Left Ventricle: LV outflow tract peak gradient at rest 138 mmHg. Left ventricular ejection fraction, by estimation, is 50 to 55%. The left ventricle has low normal function. The left ventricle has no regional wall motion abnormalities. The left ventricular internal cavity size was normal in size. There is moderate asymmetric left ventricular hypertrophy of the septal segment. Left ventricular diastolic parameters are consistent with Grade I diastolic dysfunction (impaired relaxation). Right Ventricle: The right ventricular size is normal. No increase in right ventricular wall thickness. Right ventricular systolic function is normal. There is moderately elevated pulmonary artery systolic pressure. The tricuspid regurgitant velocity is 3.45 m/s, and with an assumed right atrial pressure of 8 mmHg, the estimated right ventricular systolic pressure is 55.6 mmHg. Left Atrium: Left atrial size was moderately dilated. Right Atrium: Right atrial size was normal in size. Pericardium: There is no evidence of pericardial effusion. Mitral Valve: Mitral valve systolic anterior motion is present. The mitral valve is abnormal. Moderate mitral valve regurgitation. No evidence of mitral valve stenosis. Tricuspid Valve: The tricuspid valve is normal in structure. Tricuspid valve regurgitation is mild. Aortic Valve: The aortic valve is tricuspid. There is mild calcification of the aortic valve. Aortic valve regurgitation is not visualized. No aortic stenosis is present. Pulmonic Valve: The pulmonic valve was normal in structure. Pulmonic valve regurgitation is  trivial. Aorta: The aortic root is normal in size and structure. Venous: The inferior vena cava is normal in size with less than 50% respiratory variability, suggesting right atrial pressure of 8 mmHg. IAS/Shunts: No atrial level shunt detected by color flow Doppler.  LEFT VENTRICLE PLAX 2D LVIDd:         3.50 cm   Diastology LVIDs:         2.70 cm   LV e' medial:    4.68 cm/s LV PW:         1.10 cm   LV E/e' medial:  23.5 LV IVS:        1.50 cm   LV e' lateral:   6.74 cm/s LVOT diam:     1.90 cm   LV E/e' lateral: 16.3 LVOT Area:     2.84 cm  RIGHT VENTRICLE RV Basal diam:  2.60 cm RV Mid diam:    2.00 cm RV S prime:     17.00 cm/s TAPSE (M-mode): 1.3 cm LEFT ATRIUM             Index        RIGHT ATRIUM           Index LA Vol (A2C):   59.5 ml 37.32 ml/m  RA Area:     12.70 cm LA Vol (A4C):   69.2 ml 43.41 ml/m  RA Volume:   28.00 ml  17.56 ml/m LA Biplane Vol: 65.1 ml 40.83 ml/m   AORTA Ao Root diam: 2.60 cm Ao Asc diam:  2.70 cm MITRAL VALVE                TRICUSPID VALVE MV Area (PHT): 3.48 cm     TR Peak grad:   47.6 mmHg MV Decel Time: 218 msec     TR Vmax:        345.00 cm/s MV E velocity: 110.00 cm/s MV A velocity: 118.00 cm/s  SHUNTS MV E/A ratio:  0.93         Systemic Diam: 1.90 cm Dalton McleanMD Electronically signed by Archer Bear Signature Date/Time:  08/18/2023/2:32:04 PM    Final    CT ABDOMEN PELVIS W CONTRAST Result Date: 08/17/2023 EXAM: CT ABDOMEN AND PELVIS WITH CONTRAST 08/17/2023 08:49:42 PM TECHNIQUE: CT of the abdomen and pelvis was performed with the administration of intravenous contrast. Multiplanar reformatted images are provided for review. Automated exposure control, iterative reconstruction, and/or weight based adjustment of the mA/kV was utilized to reduce the radiation dose to as low as reasonably achievable. COMPARISON: 03/02/2023 CLINICAL HISTORY: Abdominal pain, acute, nonlocalized. Table formatting from the original note was not included. Images from the original note  were not included. Pt. BIBEMS from home w/ c/o a fall. No LOC. Pt is on eliquis  BID. Pt. C/o pain and tenderness to her left elbow. Pt. States she had first txt for bladder cancer last Tuesday and has been feeling weak and has had chills since. Per EMS stroke screen; negative. FINDINGS: LOWER CHEST: Mild subpleural reticulation/fibrosis at the lung bases. LIVER: The liver is unremarkable. GALLBLADDER AND BILE DUCTS: Gallbladder is unremarkable. No biliary ductal dilatation. SPLEEN: No acute abnormality. PANCREAS: No acute abnormality. ADRENAL GLANDS: No acute abnormality. KIDNEYS, URETERS AND BLADDER: Simple left upper pole renal cyst measuring up to 18 mm, benign. No follow-up is recommended. Mild diminished/delayed enhancement of the right kidney with associated mild right hydroureteronephrosis, although without a visualized ureteral or bladder calculus. Differential consideration to include recent passage of a ureteral calculus or ascending infection. Trace nondependent gas in the bladder with subtle perivesical stranding, correlate for cystitis. GI AND BOWEL: The appendix is not discretely visualized. PERITONEUM AND RETROPERITONEUM: No ascites. No free air. VASCULATURE: Atherosclerotic calcifications of the abdominal aorta and branch vessels. LYMPH NODES: No lymphadenopathy. REPRODUCTIVE ORGANS: No acute abnormality. BONES AND SOFT TISSUES: No acute osseous abnormality. No focal soft tissue abnormality. IMPRESSION: 1. Trace nondependent gas in the bladder and subtle perivesical stranding, correlate for cystitis. 2. Mild diminished/delayed enhancement of the right kidney with associated mild right hydroureteronephrosis. Differential considerations include recent passage of a ureteral calculus or ascending infection. Electronically signed by: Zadie Herter MD 08/17/2023 08:57 PM EDT RP Workstation: [id number]   DG Chest Port 1 View Result Date: 08/17/2023 CLINICAL DATA:  Pain. Fall. History of recent  treatment for bladder cancer. Weakness and chills. EXAM: PORTABLE CHEST 1 VIEW COMPARISON:  07/24/2022 FINDINGS: Cardiac enlargement with mild pulmonary vascular congestion. Perihilar and basilar infiltrates are new since prior study, likely edema. Pneumonia would also be a possibility. No pleural effusion or pneumothorax. Mediastinal contours appear intact. Calcification of the aorta. IMPRESSION: Cardiac enlargement with pulmonary vascular congestion and bilateral pulmonary infiltrates. Findings are new since prior study. Electronically Signed   By: Boyce Byes M.D.   On: 08/17/2023 19:41   DG Elbow Complete Left Result Date: 08/17/2023 CLINICAL DATA:  Pain after a fall. Left posterior elbow pain after a fall today. EXAM: LEFT ELBOW - COMPLETE 3+ VIEW COMPARISON:  Left forearm 08/27/2021 FINDINGS: Degenerative changes in the left elbow. No evidence of acute fracture or dislocation. No focal bone lesion or bone destruction. Bone cortex appears intact. No significant effusions. Soft tissue defect over the olecranon region consistent with laceration. No radiopaque soft tissue foreign bodies. Soft tissue calcification projected over the forearm, likely dystrophic. IMPRESSION: No acute bony abnormalities. Soft tissue defect over the olecranon region consistent with laceration. Electronically Signed   By: Boyce Byes M.D.   On: 08/17/2023 17:29    Microbiology: Recent Results (from the past 240 hours)  Culture, blood (Routine X 2) w Reflex to ID Panel  Status: None   Collection Time: 08/18/23 12:39 PM   Specimen: BLOOD RIGHT ARM  Result Value Ref Range Status   Specimen Description   Final    BLOOD RIGHT ARM Performed at [facility], [address], [place], [place] [address]    Special Requests   Final    BOTTLES DRAWN AEROBIC AND ANAEROBIC Blood Culture adequate volume Performed at [facility], [address], [place], [place] [address]    Culture   Final     NO GROWTH 5 DAYS Performed at [facility], [address] [place], [place] [address]    Report Status 08/23/2023 FINAL  Final  Culture, blood (Routine X 2) w Reflex to ID Panel     Status: None   Collection Time: 08/18/23 12:41 PM   Specimen: BLOOD  Result Value Ref Range Status   Specimen Description   Final    BLOOD BLOOD RIGHT ARM Performed at [facility], [address] [place], [place] [address]    Special Requests   Final    BOTTLES DRAWN AEROBIC AND ANAEROBIC Blood Culture adequate volume Performed at [facility], [address], [place], [place] [address]    Culture   Final    NO GROWTH 5 DAYS Performed at [facility], [address] [place], [place] [address]    Report Status 08/23/2023 FINAL  Final  Surgical PCR screen     Status: Abnormal   Collection Time: 08/20/23  1:15 AM   Specimen: Nasal Mucosa; Nasal Swab  Result Value Ref Range Status   MRSA, PCR NEGATIVE NEGATIVE Final   Staphylococcus aureus POSITIVE (A) NEGATIVE Final    Comment: (NOTE) The Xpert SA Assay (FDA approved for NASAL specimens in patients 42 years of age and older), is one component of a comprehensive surveillance program. It is not intended to diagnose infection nor to guide or monitor treatment. Performed at [facility], [address], [place], [place] [address]      Labs: Basic Metabolic Panel: Recent Labs  Lab 08/22/23 0348 08/23/23 0418 08/24/23 0351 08/25/23 0410 08/25/23 0411 08/26/23 0417 08/26/23 0418 08/27/23 1233 08/28/23 0422  NA 133*   < > 139 138  --   --  138 137 136  K 3.0*   < > 3.0* 3.5  --   --  3.6 2.9* 4.0  CL 103   < > 105 102  --   --  102 102 103  CO2 22   < > 25 27  --   --  26 25 26   GLUCOSE 104*   < > 97 89  --   --  84 94 83  BUN 18   < > 14 14  --   --  11 10 10   CREATININE 1.04*   < > 1.04* 1.11* 1.06*  --  1.25* 1.07* 1.18*  CALCIUM  8.4*   < > 8.8* 8.9  --   --  9.1 9.5  9.2  MG 1.8  --   --   --   --   --   --   --   --   PHOS  --   --   --   --   --  4.4  --   --   --    < > = values in this interval not displayed.   Liver Function Tests: Recent Labs  Lab 08/24/23 0351 08/25/23  0410 08/27/23 1233  AST 35 35 29  ALT 36 35 30  ALKPHOS 65 68 66  BILITOT 0.6 0.4 0.6  PROT 5.4* 5.6* 6.0*  ALBUMIN 2.4* 2.5* 2.9*   No results for input(s): LIPASE, AMYLASE in the last 168 hours. No results for input(s): AMMONIA in the last 168 hours. CBC: Recent Labs  Lab 08/23/23 0418 08/24/23 0351 08/26/23 0418 08/27/23 1233 08/28/23 0422  WBC 7.2 7.6 9.0 9.6 8.2  HGB 10.4* 10.4* 10.4* 11.5* 11.0*  HCT 31.8* 31.0* 32.6* 35.4* 33.9*  MCV 95.8 94.5 98.2 96.7 95.8  PLT 277 327 378 388 381   Cardiac Enzymes: No results for input(s): CKTOTAL, CKMB, CKMBINDEX, TROPONINI in the last 168 hours. BNP: BNP (last 3 results) Recent Labs    08/17/23 2025  BNP 459.6*    ProBNP (last 3 results) No results for input(s): PROBNP in the last 8760 hours.  CBG: No results for input(s): GLUCAP in the last 168 hours.     Signed:  Raymonde Calico MD.  [facility] 08/28/2023, 9:42 AM

## 2023-08-28 NOTE — Progress Notes (Signed)
 Physical Therapy Treatment Patient Details Name: Laurie Bowman MRN: [id number] DOB: 1941/01/29 Today's Date: 08/28/2023   History of Present Illness 83 yo female admitted with ARF, UTI, hyponatremia, delirium. Pt s/p right nephroureteral catheter placement 6/5 Hx of L TKA, bladder Ca-undergoing tx, cardiomyopathy, lichen slerosus, osteoporosis, breast Ca, IBS, CKD, asthma, rhabdo, falls    PT Comments  Pt seen for PT tx with pt agreeable but somewhat self limiting throughout session. Pt is able to complete sit<>stand with supervision & RW, ambulate increased distances in hallway with RW & supervision. Pt performed BLE standing marches with BUE support on RW for further BLE strengthening. Pt would benefit from ongoing PT services to progress gait with LRAD & for star training as pt's bed/bath is on 2nd floor of home.    If plan is discharge home, recommend the following: Assistance with cooking/housework;Assist for transportation;Help with stairs or ramp for entrance;A little help with walking and/or transfers;A little help with bathing/dressing/bathroom   Can travel by private vehicle     Yes  Equipment Recommendations  None recommended by PT    Recommendations for Other Services       Precautions / Restrictions Precautions Precautions: Fall Recall of Precautions/Restrictions: Impaired Precaution/Restrictions Comments: R PCN drain Restrictions Weight Bearing Restrictions Per Provider Order: No     Mobility  Bed Mobility               General bed mobility comments: not tested, pt received & left sitting in recliner    Transfers Overall transfer level: Needs assistance Equipment used: Rolling walker (2 wheels) Transfers: Sit to/from Stand Sit to Stand: Supervision           General transfer comment: sit<>stand from recliner    Ambulation/Gait Ambulation/Gait assistance: Supervision Gait Distance (Feet): 110 Feet (+ 110 ft) Assistive device: Rolling walker (2  wheels) Gait Pattern/deviations: Decreased stride length, Decreased step length - right, Decreased step length - left, Decreased dorsiflexion - left, Decreased dorsiflexion - right Gait velocity: decreased     General Gait Details: decreased dorsiflexion & heel strike BLE, PT provides cuing to attempt, slightly extra time to complete turns   Optometrist     Tilt Bed    Modified Rankin (Stroke Patients Only)       Balance Overall balance assessment: Needs assistance Sitting-balance support: Feet supported Sitting balance-Leahy Scale: Fair     Standing balance support: Bilateral upper extremity supported, Reliant on assistive device for balance, During functional activity Standing balance-Leahy Scale: Fair                              Hotel manager: No apparent difficulties  Cognition Arousal: Alert Behavior During Therapy: WFL for tasks assessed/performed   PT - Cognitive impairments: No apparent impairments                       PT - Cognition Comments: Pt reports she's agreeable to tx but also attempts at being self limiting during session. Following commands: Intact      Cueing Cueing Techniques: Verbal cues  Exercises General Exercises - Lower Extremity Hip Flexion/Marching: AROM, Standing, Strengthening, Both, 20 reps    General Comments        Pertinent Vitals/Pain Pain Assessment Pain Assessment: No/denies pain Faces Pain Scale: No hurt    Home Living  Prior Function            PT Goals (current goals can now be found in the care plan section) Acute Rehab PT Goals Patient Stated Goal: regain plof/independence;less discomfort/pain PT Goal Formulation: With patient Time For Goal Achievement: 09/01/23 Potential to Achieve Goals: Good Progress towards PT goals: Progressing toward goals    Frequency           PT Plan       Co-evaluation              AM-PAC PT 6 Clicks Mobility   Outcome Measure  Help needed turning from your back to your side while in a flat bed without using bedrails?: None Help needed moving from lying on your back to sitting on the side of a flat bed without using bedrails?: A Little Help needed moving to and from a bed to a chair (including a wheelchair)?: A Little Help needed standing up from a chair using your arms (e.g., wheelchair or bedside chair)?: A Little Help needed to walk in hospital room?: A Little Help needed climbing 3-5 steps with a railing? : A Little 6 Click Score: 19    End of Session   Activity Tolerance: Patient limited by fatigue;Patient tolerated treatment well Patient left: in chair;with chair alarm set;with nursing/sitter in room;with call bell/phone within reach   PT Visit Diagnosis: Muscle weakness (generalized) (M62.81);History of falling (Z91.81);Difficulty in walking, not elsewhere classified (R26.2)     Time: 1610-9604 PT Time Calculation (min) (ACUTE ONLY): 10 min  Charges:    $Therapeutic Activity: 8-22 mins PT General Charges $$ ACUTE PT VISIT: 1 Visit                     Emaline Handsome, PT, DPT 08/28/23, 10:30 AM   Venetta Gill 08/28/2023, 10:29 AM

## 2023-08-28 NOTE — TOC Transition Note (Signed)
 Transition of Care [facility]) - Discharge Note   Patient Details  Name: Laurie Bowman MRN: [id number] Date of Birth: 1940-09-18  Transition of [facility]) CM/SW Contact:  Gertha Ku, LCSW Phone Number: 08/28/2023, 10:06 AM   Clinical Narrative:     Pt to d/c to [facility] room 408 , RN to call report to [phone]. PTAR called TOC sign off.    Final next level of care: Skilled Nursing Facility Barriers to Discharge: Barriers Resolved   Patient Goals and CMS Choice Patient states their goals for this hospitalization and ongoing recovery are:: SNF to get stonger          Discharge Placement              Patient chooses bed at: [facility] Patient to be transferred to facility by: EMS Name of family member notified: Copper, Kirtley (Spouse)  [phone] [facility]) Patient and family notified of of transfer: 08/26/23  Discharge Plan and Services Additional resources added to the After Visit Summary for   In-house Referral: Clinical Social Work              DME Arranged: N/A DME Agency: NA                  Social Drivers of Health (SDOH) Interventions SDOH Screenings   Food Insecurity: No Food Insecurity (08/17/2023)  Housing: Low Risk  (08/17/2023)  Transportation Needs: No Transportation Needs (08/17/2023)  Utilities: Not At Risk (08/17/2023)  Social Connections: Unknown (08/18/2023)  Tobacco Use: Medium Risk (08/20/2023)     Readmission Risk Interventions     No data to display

## 2023-09-01 DIAGNOSIS — I421 Obstructive hypertrophic cardiomyopathy: Secondary | ICD-10-CM

## 2023-09-01 DIAGNOSIS — N132 Hydronephrosis with renal and ureteral calculous obstruction: Secondary | ICD-10-CM | POA: Diagnosis not present

## 2023-09-01 DIAGNOSIS — N390 Urinary tract infection, site not specified: Secondary | ICD-10-CM | POA: Diagnosis not present

## 2023-09-01 DIAGNOSIS — I480 Paroxysmal atrial fibrillation: Secondary | ICD-10-CM | POA: Diagnosis not present

## 2023-09-01 DIAGNOSIS — C679 Malignant neoplasm of bladder, unspecified: Secondary | ICD-10-CM | POA: Diagnosis not present

## 2023-09-11 DIAGNOSIS — I4891 Unspecified atrial fibrillation: Secondary | ICD-10-CM | POA: Diagnosis not present

## 2023-09-11 DIAGNOSIS — C679 Malignant neoplasm of bladder, unspecified: Secondary | ICD-10-CM | POA: Diagnosis not present

## 2023-09-11 DIAGNOSIS — N132 Hydronephrosis with renal and ureteral calculous obstruction: Secondary | ICD-10-CM | POA: Diagnosis not present

## 2023-09-12 ENCOUNTER — Ambulatory Visit: Admitting: General Practice

## 2023-09-22 NOTE — Progress Notes (Unsigned)
 Cardiology Office Note:  .   Date:  09/24/2023  ID:  Laurie Bowman, DOB 1940-09-18, MRN [id number] PCP: Lelon Norleen ORN., MD  [facility] Providers Cardiologist:  Danelle Birmingham, MD {   History of Present Illness: Laurie   Jaelene Bowman is a 83 y.o. female  with PMHx of pAF on eliquis  and amiodarone , non-obstructive hypertrophic cardiomyopathy, hypertension, mitral regurgitation (08/2023 ECHO: moderate), hyperlipidemia, LBBB, and bladder cancer currently being treated (last BCG tx 08/12/23) who reports to [facility] office for follow up.   Recent hospitalization 6/1-02/2024 was admitted for general malaise and AKI (Cr 1.4 > 1.18). Diagnosed with E. coli UTI and right-sided hydronephrosis.  Completed 10-day course of Augmentin .  Urology unsuccessfully placed stent and subsequently placed nephrostomy catheter by IR. Cardiology was consulted for A-fib with RVR and elevated troponin ([id number]) suspected secondary to demand ischemia. Converted to NSR with Cardizem . Converted back to A-fib with RVR, started on amiodarone  and converted back to NSR.  Hospital course complicated by AKI and mild rhabdomyolysis (1170>333).  Discharged to SNF.  Discharged on amiodarone  200 mg daily, Eliquis  5 mg twice daily, verapamil  120 mg daily, HCTZ 25 mg daily, Crestor  10 mg daily, and KCl 10 mEq with 1 tablet in the a.m. and 2 tablets in p.m.  Today, denies chest pain, shortness of breath, palpitations, syncope, presyncope, dizziness, orthopnea, PND, swelling or significant weight changes, acute bleeding, or claudication.  Reports compliance with medications. Reports healthy diet. She was in rehab since hospital until 09/12/2023 and reports good reports from rehab. Since being home, she has been doing well. She has upcoming appointment to continue PT & OT at home.  Denies tobacco use/Binging ETOH/drug use    ROS: 10 point review of system has been reviewed and considered negative except ones been listed in the HPI.   Studies  Reviewed: Laurie   EKG Interpretation Date/Time:  Wednesday September 24 2023 14:15:52 EDT Ventricular Rate:  69 PR Interval:  164 QRS Duration:  138 QT Interval:  448 QTC Calculation: 480 R Axis:   -19  Text Interpretation: Normal sinus rhythm Left bundle branch block When compared with ECG of 18-Aug-2023 06:21, No significant change was found Confirmed by Sheron Hallmark ([id number]) on 09/24/2023 2:24:39 PM   ECHO 08/2023 IMPRESSIONS   1. LV outflow tract peak gradient at rest 138 mmHg. Left ventricular  ejection fraction, by estimation, is 50 to 55%. The left ventricle has low  normal function. The left ventricle has no regional wall motion  abnormalities but there is septal-lateral  dyssynchrony consistent with LBBB. There is moderate asymmetric left  ventricular hypertrophy of the septal segment. Left ventricular diastolic  parameters are consistent with Grade I diastolic dysfunction (impaired  relaxation).   2. Right ventricular systolic function is normal. The right ventricular  size is normal. There is moderately elevated pulmonary artery systolic  pressure. The estimated right ventricular systolic pressure is 55.6 mmHg.   3. Left atrial size was moderately dilated.   4. Mitral valve systolic anterior motion is present. The mitral valve is  abnormal. Moderate eccentric mitral valve regurgitation. No evidence of  mitral stenosis.   5. The aortic valve is tricuspid. There is mild calcification of the  aortic valve. Aortic valve regurgitation is not visualized. No aortic  stenosis is present.   6. The inferior vena cava is normal in size with <50% respiratory  variability, suggesting right atrial pressure of 8 mmHg.   7. 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   Heart  Monitor 12/2021 NSR with sinus brady and sinus tachycardia Brief and infrequent runs of NS SVT lasting seconds. Rare PAC and PVC's No prolonged pauses or atrial fib   Gregg Taylor,MD   Patch Wear Time:  12 days and 23 hours  (2023-10-09T12:37:15-0400 to 2023-10-22T12:01:42-0400)   Patient had a min HR of 54 bpm, max HR of 154 bpm, and avg HR of 68 bpm. Predominant underlying rhythm was Sinus Rhythm. Bundle Branch Block/IVCD was present. QRS morphology changes were present throughout recording. 12 Supraventricular Tachycardia runs  occurred, the run with the fastest interval lasting 14 beats with a max rate of 154 bpm (avg 138 bpm); the run with the fastest interval was also the longest. Isolated SVEs were rare (<1.0%), SVE Couplets were rare (<1.0%), and SVE Triplets were rare  (<1.0%). Isolated VEs were rare (<1.0%), VE Couplets were rare (<1.0%), and no VE Triplets were present.  Risk Assessment/Calculations:    CHA2DS2-VASc Score = 4  This indicates a 4.8% annual risk of stroke. The patient's score is based upon: CHF History: 0 HTN History: 1 Diabetes History: 0 Stroke History: 0 Vascular Disease History: 0 Age Score: 2 Gender Score: 1  Physical Exam:   VS:  BP 138/70   Pulse 69   Ht 5' (1.524 m)   Wt 134 lb (60.8 kg)   LMP  (LMP Unknown)   SpO2 95%   BMI 26.17 kg/m    Wt Readings from Last 3 Encounters:  09/24/23 134 lb (60.8 kg)  08/20/23 138 lb (62.6 kg)  06/26/23 145 lb (65.8 kg)    GEN: Well nourished, well developed in no acute distress while sitting in chair.  NECK: No JVD; No carotid bruits CARDIAC: RRR, no murmurs, rubs, gallops RESPIRATORY:  Clear to auscultation without rales, wheezing or rhonchi  ABDOMEN: Soft, non-tender, non-distended EXTREMITIES:  No edema; No deformity   ASSESSMENT AND PLAN: .   Paroxysmal atrial fibrillation Recent hospitalization 08/2023 as above in the setting UTI. Converted to NSR with Cardizem . Converted back to A-fib with RVR, started on amiodarone  and converted back to NSR.  EKG in OV remains In NSR.  Denied any palpitations. 08/2023 LFT and TSH WNL  Continue on amiodarone  200 mg daily, Eliquis  5 mg twice daily, verapamil  120 mg daily Eliquis  is the  appropriate dose given her age, weight and renal function. (83 y/o, Cr 1.18, WT 60.8) Patient expresses interest in discontinuing amiodarone .  Discussed that amiodarone  is usually continued for 3 months after converting from A-fib.  Patient agrees to continue for 3 months.  Discussed that I will message Dr. Waddell to see if he agrees with discontinuation.  ADDENDUM 09/25/2023: Per Dr. Waddell, he agree with discontinuation of amiodarone  on 11/28/2023. Will notify patient.   HTN Home SBP usually 110-120.  BP well controlled this OV: 138/70 Continue on hydrochlorothiazide  25 mg daily and verapamil  as above.   HLD, LDL goal < 70 07/2023 LDL 64, TGC 175. 08/2023 AST/ALT WNL. Continue on Crestor  10 mg daily   Nonobstructive hypertrophic cardiomyopathy Echo 08/18/23 showed LV outflow gradient at rest 138 mmHg. EF 50-55% with dyssynchrony consistent with LBBB. Grade 1 Diastolic dysfunction. This is similar to prior echo 09/01/21.  Continue verapamil  as above  MR-moderate Echo 08/18/23 showed moderate eccentric mitral regurgitation without evidence of MS.  Asymptomatic, will continue to monitor  AKI on CKD 3a During hospitalization Cr 1.4 > 1.18.  Order BMP Encouraged daily hydration   ADDENDUM #2 09/25/2023: Creatinine/renal function remains elevated and higher at  1.41 (08/28/2023 1.18). Hydrochlorothiazide  is the only medication that patient is on that would affect renal function. Recommend reducing HCTZ to 12.5 mg daily and increasing verapamil  to 180 mg daily in order to compensate for blood pressure. Also encouraged patient to stay hydrated with up to 60 ounces of water. Order repeat BMP in 1 week.   Hypokalemia  During hospitalization K was reduced as low as 2.9.  Discharged with 10 mEq in the a.m. and 20 mEq in the p.m. Order BMP to reassess.   ADDENDUM 09/25/2023: Reviewed BMP. K WNL at 4. Can continue with current regimen.      Dispo: Follow-up in 2 months with Dr. Waddell  Signed, Lorette CINDERELLA Kapur, PA-C

## 2023-09-24 ENCOUNTER — Encounter: Payer: Self-pay | Admitting: General Practice

## 2023-09-24 ENCOUNTER — Ambulatory Visit: Attending: Cardiovascular Disease | Admitting: Physician Assistant

## 2023-09-24 VITALS — BP 138/70 | HR 69 | Ht 60.0 in | Wt 134.0 lb

## 2023-09-24 DIAGNOSIS — I10 Essential (primary) hypertension: Secondary | ICD-10-CM

## 2023-09-24 DIAGNOSIS — E785 Hyperlipidemia, unspecified: Secondary | ICD-10-CM

## 2023-09-24 DIAGNOSIS — I493 Ventricular premature depolarization: Secondary | ICD-10-CM | POA: Diagnosis not present

## 2023-09-24 DIAGNOSIS — I340 Nonrheumatic mitral (valve) insufficiency: Secondary | ICD-10-CM

## 2023-09-24 DIAGNOSIS — I422 Other hypertrophic cardiomyopathy: Secondary | ICD-10-CM

## 2023-09-24 DIAGNOSIS — I480 Paroxysmal atrial fibrillation: Secondary | ICD-10-CM

## 2023-09-24 DIAGNOSIS — N179 Acute kidney failure, unspecified: Secondary | ICD-10-CM

## 2023-09-24 DIAGNOSIS — E876 Hypokalemia: Secondary | ICD-10-CM

## 2023-09-24 MED ORDER — AMIODARONE HCL 200 MG PO TABS: 200.0000 mg | ORAL_TABLET | Freq: Every day | ORAL | 0 refills | Status: DC

## 2023-09-24 NOTE — Patient Instructions (Signed)
 Medication Instructions:  NO CHANGES   Lab Work: BMET TO BE DONE TODAY.   Testing/Procedures: NONE  Follow-Up: At [facility], you and your health needs are our priority.  As part of our continuing mission to provide you with exceptional heart care, our providers are all part of one team.  This team includes your primary Cardiologist (physician) and Advanced Practice Providers or APPs (Physician Assistants and Nurse Practitioners) who all work together to provide you with the care you need, when you need it.  Your next appointment:   2 month(s)  Provider:   Danelle Birmingham, MD

## 2023-09-25 ENCOUNTER — Ambulatory Visit: Payer: Self-pay | Admitting: Physician Assistant

## 2023-09-25 DIAGNOSIS — N179 Acute kidney failure, unspecified: Secondary | ICD-10-CM

## 2023-09-25 DIAGNOSIS — I10 Essential (primary) hypertension: Secondary | ICD-10-CM

## 2023-09-25 LAB — BASIC METABOLIC PANEL WITH GFR
BUN/Creatinine Ratio: 13 (ref 12–28)
BUN: 18 mg/dL (ref 8–27)
CO2: 23 mmol/L (ref 20–29)
Calcium: 10.2 mg/dL (ref 8.7–10.3)
Chloride: 100 mmol/L (ref 96–106)
Creatinine, Ser: 1.41 mg/dL — ABNORMAL HIGH (ref 0.57–1.00)
Glucose: 76 mg/dL (ref 70–99)
Potassium: 4 mmol/L (ref 3.5–5.2)
Sodium: 139 mmol/L (ref 134–144)
eGFR: 37 mL/min/1.73 — ABNORMAL LOW (ref >59)

## 2023-09-25 MED ORDER — HYDROCHLOROTHIAZIDE 12.5 MG PO CAPS: 12.5000 mg | ORAL_CAPSULE | Freq: Every day | ORAL | 3 refills | Status: AC

## 2023-09-25 MED ORDER — VERAPAMIL HCL ER 180 MG PO TBCR: 180.0000 mg | EXTENDED_RELEASE_TABLET | Freq: Every day | ORAL | 3 refills | Status: DC

## 2023-09-25 NOTE — Telephone Encounter (Signed)
-----   Message from Lorette CINDERELLA Kapur sent at 09/25/2023  8:57 AM EDT ----- Attempted to call patient to discuss results.  If patient returns call please inform: Electrolytes and glucose are within normal limits.  Creatinine/renal function remains elevated and higher at 1.41  (08/28/2023 1.18).  Hydrochlorothiazide  is the only medication that patient is on that would affect renal function.  Recommend reducing HCTZ to 12.5 mg daily and increasing verapamil  to 180 mg daily  in order to compensate for blood pressure.  Also encouraged patient to stay hydrated with up to 60 ounces of water.  Order repeat BMP in 1 week. ----- Message ----- From: Interface, [facility] Lab Results In Sent: 09/25/2023  12:37 AM EDT To: Lorette CINDERELLA Kapur, PA-C

## 2023-09-25 NOTE — Telephone Encounter (Signed)
 Spoke with pt's husband (per DPR) and pt regarding pt's results. Pt aware of results. Hydrochlorothiazide  reduced to 12.5 mg and verapamil  increased to 180 mg. BMET in 1 week ordered and released. Pt encouraged to stay hydrated. Pt and husband verbalized understanding. All questions if any were answered.

## 2023-09-29 ENCOUNTER — Other Ambulatory Visit: Payer: Self-pay | Admitting: Radiology

## 2023-09-29 DIAGNOSIS — C679 Malignant neoplasm of bladder, unspecified: Secondary | ICD-10-CM

## 2023-09-29 NOTE — H&P (Signed)
 Chief Complaint: Bladder cancer, obstructed right kidney, status post right nephroureteral catheter placement on 08/21/2023; referred for right nephrostogram with attempted placement of right ureteral stent  Referring Provider(s): Herrick,B  Supervising Physician: Hughes Simmonds  Patient Status: [facility] - Out-pt  History of Present Illness: Laurie Bowman is an 83 y.o. female ex smoker  with PMH sig for asthma,HTN, fatty liver, GERD, HLD, hypertrophic cardiomyopathy, pafib on eliquis , IBS, LBBB and nonmuscle invasive low grade bladder cancer with prior intravesical BCG, TURBT overlying rt ureteral orifice; unsuccessful rt ureteral stent placement by urology 6/4; s/p rt NU cath placement on 6/5 ; presents today for follow-up right nephrostogram with attempted ureteral stent placement.   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 Possibly aggravated by Tamoxifen   Fibrocystic breast changes 07/08/2011    Fracture, Col[place], left, closed 05/03/2021   GERD (gastroesophageal reflux disease) 02/06/2016   Heart murmur, systolic 12/16/2013   Overview:  Overview:  Mild MR/TR, echo 2007 without change since 2001   Hyperlipidemia LDL goal <70 12/16/2013   Hyperlipidemia, unspecified 12/16/2013   Hypertension    Hypertensive heart disease 04/30/2018   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   Hypokalemia 09/27/2020   Infection due to Staphylococcus epidermidis 09/27/2020   Irritable bowel syndrome 12/16/2013   LBBB (left bundle branch block) 04/30/2018   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   Major depressive disorder, recurrent, unspecified (HCC) 12/16/2013   Mitral regurgitation 04/30/2018   Moderate persistent asthma 09/28/2015   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 06/21/2015   Other chest pain 09/27/2020   Other specified disorders of eyelid 11/09/2015   Pain in unspecified knee 12/16/2013   Peripheral visual field defect of both eyes 04/21/2015   PVC's (premature ventricular contractions) 04/30/2018   Rotator cuff tendonitis 12/16/2013   Undiagnosed cardiac murmurs 12/16/2013   Overview:  Mild MR/TR, echo 2007 without change since 2001   Urticaria    Ventricular premature depolarization 12/16/2013   Wound dehiscence 05/08/2015    Past Surgical History:  Procedure Laterality Date   ABDOMINAL ADHESION SURGERY     ADENOIDECTOMY     BREAST SURGERY     carpel tunnel     CESAREAN SECTION     CYSTOSCOPY W/ URETERAL STENT PLACEMENT Right 08/20/2023   Procedure: CYSTOSCOPY, WITH RETROGRADE PYELOGRAM AND URETERAL STENT INSERTION;  Surgeon: Cam Morene ORN, MD;  Location: [facility] ORS;  Service: Urology;  Laterality:  Right;   IR NEPHROURETERAL CATH PLACE RIGHT  08/21/2023   KNEE SURGERY     LEG SURGERY     bone graft   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/2022   TONSILLECTOMY     VEIN SURGERY      Right Leg    Allergies: Cefaclor, Codeine, Fructose, Sulfa antibiotics, Meperidine, Elemental sulfur, and Propoxyphene  Medications: Prior to Admission medications   Medication Sig Start Date End Date Taking? Authorizing Provider  acetaminophen  (TYLENOL ) 500 MG tablet Take 500 mg by mouth every 4 (four) hours as needed for moderate pain (pain score 4-6). 09/18/20   [provider]  albuterol  (PROVENTIL ) (2.5 MG/3ML) 0.083% nebulizer solution Take 3 mLs (2.5 mg total) by nebulization every 6 (six) hours as needed for wheezing or shortness of breath. 06/18/21   Iva Marty Saltness, MD  albuterol  (VENTOLIN  HFA) 108 [phone] Base) MCG/ACT inhaler USE 1-2 PUFFS EVERY 4-6 HOURS AS NEEDED FOR COUGH OR WHEEZE. 07/02/22   Iva Marty Saltness, MD  amiodarone  (PACERONE ) 200 MG tablet Take 1 tablet (200 mg total) by mouth daily. 09/24/23   Sheron Lorette GRADE, PA-C  apixaban  (ELIQUIS ) 5 MG TABS tablet Take 1 tablet (5 mg total) by mouth 2 (two) times daily. 06/19/23   Waddell Danelle ORN, MD  augmented betamethasone dipropionate (DIPROLENE-AF) 0.05 % cream Apply 0.05 application  topically daily as needed (Lichen).    [provider]  benzonatate  (TESSALON ) 100 MG capsule Take 100 mg by mouth daily as needed for cough. 07/02/18   [provider]  buPROPion  (WELLBUTRIN  XL) 150 MG 24 hr tablet Take 150 mg by mouth daily. 12/02/21   [provider]  Cholecalciferol  (VITAMIN D) 50 MCG (2000 UT) CAPS Take 2,000 Units by mouth daily.    [provider]  clobetasol cream (TEMOVATE) 0.05 % Apply 1 application  topically daily as needed (Scratches). 03/22/18   [provider]  clotrimazole-betamethasone (LOTRISONE) cream Apply 1 application  topically daily as needed (Fugus). 03/16/19   [provider]  diclofenac Sodium (VOLTAREN) 1 % GEL Apply 2 g topically 4 (four) times daily as needed (Pain). Both knees 04/16/19   [provider]  dicyclomine  (BENTYL ) 20 MG  tablet Take 20 mg by mouth daily as needed for spasms. 04/14/18   [provider]  feeding supplement (ENSURE PLUS HIGH PROTEIN) LIQD Take 237 mLs by mouth 2 (two) times daily between meals. 08/27/23   Christobal Guadalajara, MD  fexofenadine (ALLEGRA) 180 MG tablet Take 180 mg by mouth daily.     [provider]  fluticasone  (FLOVENT  HFA) 110 MCG/ACT inhaler Use two puffs twice daily for one to two weeks during flares Patient taking differently: Inhale 2 puffs into the lungs daily as needed (Asthma). 06/26/23   Iva Marty Saltness, MD  hydrochlorothiazide  (MICROZIDE ) 12.5 MG capsule Take 1 capsule (12.5 mg total) by mouth daily. 09/25/23   Sheron Lorette GRADE, PA-C  lansoprazole  (PREVACID ) 30 MG capsule Take 1 capsule (30 mg total) by mouth 2 (two) times daily before a meal. 07/10/21   Iva Marty Saltness, MD  loperamide  (IMODIUM ) 2 MG capsule Take 1 capsule (2 mg total) by mouth as needed for diarrhea or loose stools (diarrhea). 08/27/23   Christobal Guadalajara, MD  mometasone  (NASONEX ) 50 MCG/ACT nasal spray Place 2 sprays into the nose daily. Patient taking differently: Place 2 sprays into the nose daily as needed (Rhinitis). 06/26/23   Iva Marty Saltness, MD  montelukast  (SINGULAIR ) 10 MG tablet Take 1 tablet (  10 mg total) by mouth at bedtime. 01/09/23   Iva Marty Saltness, MD  olopatadine  (PATANOL) 0.1 % ophthalmic solution Place 1 drop into both eyes 2 (two) times daily as needed. 04/18/20   Iva Marty Saltness, MD  Polyethylene Glycol 3350  (PEG 3350 ) POWD Take 17 g by mouth daily.  06/24/16   [provider]  potassium chloride  (KLOR-CON ) 10 MEQ tablet Take 10-20 mEq by mouth See admin instructions. Take 10 mEq (1 tablet) by mouth every morning and 20 mEq (2 tablets) every night. 04/21/15   [provider]  rosuvastatin  (CRESTOR ) 10 MG tablet Take 10 mg by mouth daily. 07/13/21   [provider]  tiZANidine (ZANAFLEX) 4 MG tablet Take 2 mg by mouth at bedtime as needed  (Sleep). 11/16/19   [provider]  triamcinolone cream (KENALOG) 0.1 % Apply 1 application  topically 2 (two) times daily as needed (Eyes). 12/14/18   [provider]  verapamil  (CALAN -SR) 180 MG CR tablet Take 1 tablet (180 mg total) by mouth at bedtime. 09/25/23   Sheron Lorette GRADE, PA-C     Family History  Problem Relation Age of Onset   Congestive Heart Failure Mother    Hypertension Mother    Hyperlipidemia Sister    Dementia Sister    Alzheimer's disease Sister    Stroke Maternal Grandmother    Breast cancer Sister    Hyperlipidemia Sister    Allergic rhinitis Neg Hx    Angioedema Neg Hx    Asthma Neg Hx    Eczema Neg Hx    Immunodeficiency Neg Hx    Urticaria Neg Hx    Atopy Neg Hx     Social History   Socioeconomic History   Marital status: Married    Spouse name: Not on file   Number of children: Not on file   Years of education: Not on file   Highest education level: Not on file  Occupational History   Not on file  Tobacco Use   Smoking status: Former    Types: Cigarettes    Passive exposure: Past   Smokeless tobacco: Never  Vaping Use   Vaping status: Never Used  Substance and Sexual Activity   Alcohol use: No    Alcohol/week: 0.0 standard drinks of alcohol   Drug use: No   Sexual activity: Never  Other Topics Concern   Not on file  Social History Narrative   Not on file   Social Drivers of Health   Financial Resource Strain: Not on file  Food Insecurity: No Food Insecurity (08/17/2023)   Hunger Vital Sign    Worried About Running Out of Food in the Last Year: Never true    Ran Out of Food in the Last Year: Never true  Transportation Needs: No Transportation Needs (08/17/2023)   PRAPARE - Administrator, Civil Service (Medical): No    Lack of Transportation (Non-Medical): No  Physical Activity: Not on file  Stress: Not on file  Social Connections: Unknown (08/18/2023)   Social Connection and Isolation Panel     Frequency of Communication with Friends and Family: Patient declined    Frequency of Social Gatherings with Friends and Family: Patient declined    Attends Religious Services: Patient declined    Database administrator or Organizations: Patient declined    Attends Banker Meetings: Patient declined    Marital Status: Married       Review of Systems  Vital Signs: LMP  (  LMP Unknown)   Advance Care Plan: No documents on file  Physical Exam  Imaging: No results found.  Labs:  CBC: Recent Labs    08/24/23 0351 08/26/23 0418 08/27/23 1233 08/28/23 0422  WBC 7.6 9.0 9.6 8.2  HGB 10.4* 10.4* 11.5* 11.0*  HCT 31.0* 32.6* 35.4* 33.9*  PLT 327 378 388 381    COAGS: Recent Labs    08/20/23 1808 08/21/23 1021  INR  --  1.5*  APTT 41*  --     BMP: Recent Labs    08/25/23 0411 08/26/23 0418 08/27/23 1233 08/28/23 0422 09/24/23 1520  NA  --  138 137 136 139  K  --  3.6 2.9* 4.0 4.0  CL  --  102 102 103 100  CO2  --  26 25 26 23   GLUCOSE  --  84 94 83 76  BUN  --  11 10 10 18   CALCIUM   --  9.1 9.5 9.2 10.2  CREATININE 1.06* 1.25* 1.07* 1.18* 1.41*  GFRNONAA 53* 43* 52* 46*  --     LIVER FUNCTION TESTS: Recent Labs    08/20/23 0415 08/24/23 0351 08/25/23 0410 08/27/23 1233  BILITOT 1.5* 0.6 0.4 0.6  AST 71* 35 35 29  ALT 46* 36 35 30  ALKPHOS 52 65 68 66  PROT 5.4* 5.4* 5.6* 6.0*  ALBUMIN 2.5* 2.4* 2.5* 2.9*    TUMOR MARKERS: No results for input(s): AFPTM, CEA, CA199, CHROMGRNA in the last 8760 hours.  Assessment and Plan: 83 y.o. 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  Details/risks of procedure, including but not limited to, internal bleeding, infection, injury to adjacent structures, inability to place stent discussed with patient with her understanding and consent.   Thank you for allowing our service to participate in Asjah Rauda 's care.  Electronically Signed: D. Franky Rakers, PA-C   09/29/2023, 11:35 AM      I spent a total of    25 Minutes in face to face in clinical consultation, greater than 50% of which was counseling/coordinating care for image guided right nephrostogram with attempted ureteral stent placement

## 2023-09-30 ENCOUNTER — Encounter (HOSPITAL_COMMUNITY): Payer: Self-pay

## 2023-09-30 ENCOUNTER — Ambulatory Visit (HOSPITAL_COMMUNITY)
Admission: RE | Admit: 2023-09-30 | Discharge: 2023-09-30 | Disposition: A | Discharge status: Home / Self Care | Source: Ambulatory Visit | Attending: Radiology | Admitting: Radiology

## 2023-09-30 ENCOUNTER — Other Ambulatory Visit: Payer: Self-pay

## 2023-09-30 ENCOUNTER — Ambulatory Visit (HOSPITAL_COMMUNITY)
Admit: 2023-09-30 | Discharge: 2023-09-30 | Disposition: A | Discharge status: Home / Self Care | Attending: Radiology | Admitting: Radiology

## 2023-09-30 DIAGNOSIS — E785 Hyperlipidemia, unspecified: Secondary | ICD-10-CM | POA: Diagnosis not present

## 2023-09-30 DIAGNOSIS — K589 Irritable bowel syndrome without diarrhea: Secondary | ICD-10-CM | POA: Insufficient documentation

## 2023-09-30 DIAGNOSIS — I480 Paroxysmal atrial fibrillation: Secondary | ICD-10-CM | POA: Diagnosis not present

## 2023-09-30 DIAGNOSIS — I129 Hypertensive chronic kidney disease with stage 1 through stage 4 chronic kidney disease, or unspecified chronic kidney disease: Secondary | ICD-10-CM | POA: Insufficient documentation

## 2023-09-30 DIAGNOSIS — K760 Fatty (change of) liver, not elsewhere classified: Secondary | ICD-10-CM | POA: Insufficient documentation

## 2023-09-30 DIAGNOSIS — I447 Left bundle-branch block, unspecified: Secondary | ICD-10-CM | POA: Diagnosis not present

## 2023-09-30 DIAGNOSIS — K219 Gastro-esophageal reflux disease without esophagitis: Secondary | ICD-10-CM | POA: Insufficient documentation

## 2023-09-30 DIAGNOSIS — I422 Other hypertrophic cardiomyopathy: Secondary | ICD-10-CM | POA: Insufficient documentation

## 2023-09-30 DIAGNOSIS — C679 Malignant neoplasm of bladder, unspecified: Secondary | ICD-10-CM | POA: Insufficient documentation

## 2023-09-30 DIAGNOSIS — Z87891 Personal history of nicotine dependence: Secondary | ICD-10-CM | POA: Insufficient documentation

## 2023-09-30 DIAGNOSIS — Z7901 Long term (current) use of anticoagulants: Secondary | ICD-10-CM | POA: Diagnosis not present

## 2023-09-30 DIAGNOSIS — Z79899 Other long term (current) drug therapy: Secondary | ICD-10-CM | POA: Diagnosis not present

## 2023-09-30 DIAGNOSIS — N182 Chronic kidney disease, stage 2 (mild): Secondary | ICD-10-CM | POA: Insufficient documentation

## 2023-09-30 LAB — CBC WITH DIFFERENTIAL/PLATELET
Abs Immature Granulocytes: 0.04 K/uL (ref 0.00–0.07)
Basophils Absolute: 0.1 K/uL (ref 0.0–0.1)
Basophils Relative: 1 %
Eosinophils Absolute: 0.2 K/uL (ref 0.0–0.5)
Eosinophils Relative: 3 %
HCT: 35.4 % — ABNORMAL LOW (ref 36.0–46.0)
Hemoglobin: 11.5 g/dL — ABNORMAL LOW (ref 12.0–15.0)
Immature Granulocytes: 1 %
Lymphocytes Relative: 13 %
Lymphs Abs: 1 K/uL (ref 0.7–4.0)
MCH: 31.4 pg (ref 26.0–34.0)
MCHC: 32.5 g/dL (ref 30.0–36.0)
MCV: 96.7 fL (ref 80.0–100.0)
Monocytes Absolute: 0.8 K/uL (ref 0.1–1.0)
Monocytes Relative: 10 %
Neutro Abs: 5.5 K/uL (ref 1.7–7.7)
Neutrophils Relative %: 72 %
Platelets: 253 K/uL (ref 150–400)
RBC: 3.66 MIL/uL — ABNORMAL LOW (ref 3.87–5.11)
RDW: 13.4 % (ref 11.5–15.5)
WBC: 7.7 K/uL (ref 4.0–10.5)
nRBC: 0 % (ref 0.0–0.2)

## 2023-09-30 LAB — BASIC METABOLIC PANEL WITH GFR
Anion gap: 12 (ref 5–15)
BUN: 19 mg/dL (ref 8–23)
CO2: 24 mmol/L (ref 22–32)
Calcium: 9.9 mg/dL (ref 8.9–10.3)
Chloride: 101 mmol/L (ref 98–111)
Creatinine, Ser: 1.46 mg/dL — ABNORMAL HIGH (ref 0.44–1.00)
GFR, Estimated: 36 mL/min — ABNORMAL LOW (ref >60)
Glucose, Bld: 97 mg/dL (ref 70–99)
Potassium: 3.7 mmol/L (ref 3.5–5.1)
Sodium: 137 mmol/L (ref 135–145)

## 2023-09-30 LAB — PROTIME-INR
INR: 1.7 — ABNORMAL HIGH (ref 0.8–1.2)
Prothrombin Time: 20.5 s — ABNORMAL HIGH (ref 11.4–15.2)

## 2023-09-30 MED ORDER — SODIUM CHLORIDE 0.9% FLUSH: 5.0000 mL | Freq: Three times a day (TID) | INTRAVENOUS | Status: DC

## 2023-09-30 MED ORDER — LIDOCAINE-EPINEPHRINE 1 %-1:100000 IJ SOLN
20.0000 mL | Freq: Once | INTRAMUSCULAR | Status: AC
  Administered 2023-09-30: 10 mL via INTRADERMAL

## 2023-09-30 MED ORDER — MIDAZOLAM HCL 2 MG/2ML IJ SOLN
INTRAMUSCULAR | Status: DC | PRN
  Administered 2023-09-30: .5 mg via INTRAVENOUS

## 2023-09-30 MED ORDER — MIDAZOLAM HCL 2 MG/2ML IJ SOLN
INTRAMUSCULAR | Status: AC
  Filled 2023-09-30: qty 2

## 2023-09-30 MED ORDER — LEVOFLOXACIN IN D5W 500 MG/100ML IV SOLN
500.0000 mg | Freq: Once | INTRAVENOUS | Status: AC
  Administered 2023-09-30: 500 mg via INTRAVENOUS
  Filled 2023-09-30: qty 100

## 2023-09-30 MED ORDER — MIDAZOLAM HCL 2 MG/2ML IJ SOLN
INTRAMUSCULAR | Status: DC | PRN
Start: 2023-09-30 — End: 2023-10-01
  Administered 2023-09-30: 1 mg via INTRAVENOUS

## 2023-09-30 MED ORDER — FENTANYL CITRATE (PF) 100 MCG/2ML IJ SOLN
INTRAMUSCULAR | Status: DC | PRN
  Administered 2023-09-30: 50 ug via INTRAVENOUS

## 2023-09-30 MED ORDER — IOHEXOL 300 MG/ML  SOLN
50.0000 mL | Freq: Once | INTRAMUSCULAR | Status: AC | PRN
  Administered 2023-09-30: 25 mL

## 2023-09-30 MED ORDER — SODIUM CHLORIDE 0.9 % IV SOLN: INTRAVENOUS | Status: DC

## 2023-09-30 MED ORDER — LIDOCAINE-EPINEPHRINE 1 %-1:100000 IJ SOLN
INTRAMUSCULAR | Status: AC
Start: 2023-09-30 — End: 2023-09-30
  Filled 2023-09-30: qty 1

## 2023-09-30 MED ORDER — MIDAZOLAM HCL 2 MG/2ML IJ SOLN
INTRAMUSCULAR | Status: DC | PRN
  Administered 2023-09-30: 1 mg via INTRAVENOUS

## 2023-09-30 MED ORDER — FENTANYL CITRATE (PF) 100 MCG/2ML IJ SOLN
INTRAMUSCULAR | Status: AC
Start: 2023-09-30 — End: 2023-09-30
  Filled 2023-09-30: qty 2

## 2023-09-30 MED ORDER — MIDAZOLAM HCL 2 MG/2ML IJ SOLN
INTRAMUSCULAR | Status: DC | PRN
Start: 2023-09-30 — End: 2023-10-01
  Administered 2023-09-30: .5 mg via INTRAVENOUS

## 2023-09-30 MED ORDER — FENTANYL CITRATE (PF) 100 MCG/2ML IJ SOLN
INTRAMUSCULAR | Status: AC
  Filled 2023-09-30: qty 2

## 2023-09-30 NOTE — Procedures (Signed)
 Vascular and Interventional Radiology Procedure Note  Patient: Laurie Bowman DOB: 13-Aug-1940 Medical Record Number: [id number] Note Date/Time: 09/30/23 1:08 PM   Performing Physician: Thom Hall, MD Assistant(s): None  Diagnosis: Routine exchange. and conversion to ureteral stent  Procedure:  RIGHT NEPHROURETERAL CATHETER CONVERSION TO URETERAL STENT RIGHT ANTEROGRADE NEPHROSTOGRAM  Anesthesia: Conscious Sedation Complications: None Estimated Blood Loss: Minimal Specimens:  None  Findings:  Successful exchange for a 8.5 f 22 cm R ureteral stent and 10 F nephrostomy tube into the right kidney(s).   Plan: R PCN capped . Remove in 2 wks. Resume previous  care.  See detailed procedure note with images in PACS. The patient tolerated the procedure well without incident or complication and was returned to Recovery in stable condition.    Thom Hall, MD [facility]   Pager. [phone] Clinic. [phone]

## 2023-09-30 NOTE — Discharge Instructions (Addendum)
 Please call Interventional Radiology clinic [phone] with any questions or concerns.  You may change your dressing as needed but wait until tomorrow for first dressing change if needed and shower tomorrow.  After the procedure, it is common to have: Some soreness where the nephrostomy tube was inserted (tube insertion site) Blood-tinged drainage from the nephrostomy tube for the first 24 hours  Follow these instructions at home:  Medication: Do not use Aspirin  or ibuprofen products, such as Advil or Motrin, as it may increase bleeding.  You may resume your usual medications as ordered by your doctor  Eating and drinking: Drink plenty of liquids to keep your urine pale yellow You can resume your regular diet as directed by your doctor  Care of the procedure site Follow instructions from your health care provider about how to take care of your tube insertion site. Make sure you: Wash your hands with soap and water for at least 20 seconds before you change your dressing. If soap and water are not available, use hand sanitizer. Change your dressing as told by your health care provider. Be careful not to pull on the tube while removing the dressing When you change the dressing, wash the skin around the tube, rinse well, and pat the skin dry Avoid using scissors to remove the dressing. Sharp objects may damage the catheter Check the tube insertion area every day for signs of infection. Check for: Redness, swelling, or pain Fluid or blood Warmth Pus or a bad smell Care of the nephrostomy tube and drainage bag if needed Always keep the tubing, the leg bag, or the bedside drainage bags below the level of the kidney so that your urine drains freely When connecting your nephrostomy tube to a drainage bag, make sure that there are no kinks in the tubing and that your urine is draining freely. You may want to use an elastic bandage to wrap any exposed tubing that goes from the nephrostomy tube  to any of the connecting tubes. At night, you may want to connect your nephrostomy tube or the leg bag to a larger bedside drainage bag Follow instructions from your health care provider about how to empty or change the drainage bag Empty the drainage bag when it becomes ? full Replace the drainage bag and any extension tubing that is connected to your nephrostomy tube every 7 days or as told by your health care provider. Your health care provider will explain how to change the drainage bag and extension tubing. The nephrostomy tube will need to be changed every 8-12 weeks   Activity Do not lift anything that is heavier than 10 lb (4.5 kg), or the limit that you are told, until your health care provider says that it is safe Return to your normal activities as told by your health care provider.  Avoid activities that may cause the nephrostomy tubing to bend Do not take baths, swim, or use a hot tub until your health care provider approves. Ask your health care provider if you can take showers.  Cover the nephrostomy tube bandage (dressing) with a watertight covering when you take a shower Keep all follow-up visits as told by your doctor  Contact a health care provider if: You have problems with any of the valves or tubing You have persistent pain or soreness in your back You have redness, swelling, or pain around your tube insertion site You have fluid or blood coming from your tube insertion site Your tube insertion site feels warm  to the touch You have pus or a bad smell coming from your tube insertion site You have increased urine output or you feel burning when urinating  Get help right away if: You have pain in your abdomen during the first week You have chest pain or have trouble breathing You have a new appearance of blood in your urine You have a fever or chills You have back pain that is not relieved by your medicine You have decreased urine output Your nephrostomy tube comes  out  Moderate Conscious Sedation-Care After  This sheet gives you information about how to care for yourself after your procedure. Your health care provider may also give you more specific instructions. If you have problems or questions, contact your health care provider.  After the procedure, it is common to have: Sleepiness for several hours. Impaired judgment for several hours. Difficulty with balance. Vomiting if you eat too soon.  Follow these instructions at home:  Rest. Do not participate in activities where you could fall or become injured. Do not drive or use machinery. Do not drink alcohol. Do not take sleeping pills or medicines that cause drowsiness. Do not make important decisions or sign legal documents. Do not take care of children on your own.  Eating and drinking Follow the diet recommended by your health care provider. Drink enough fluid to keep your urine pale yellow. If you vomit: Drink water, juice, or soup when you can drink without vomiting. Make sure you have little or no nausea before eating solid foods.  General instructions Take over-the-counter and prescription medicines only as told by your health care provider. Have a responsible adult stay with you for the time you are told. It is important to have someone help care for you until you are awake and alert. Do not smoke. Keep all follow-up visits as told by your health care provider. This is important.  Contact a health care provider if: You are still sleepy or having trouble with balance after 24 hours. You feel light-headed. You keep feeling nauseous or you keep vomiting. You develop a rash. You have a fever. You have redness or swelling around the IV site.  Get help right away if: You have trouble breathing. You have new-onset confusion at home.  This information is not intended to replace advice given to you by your health care provider. Make sure you discuss any questions you have with  your healthcare provider.

## 2023-10-07 ENCOUNTER — Encounter: Payer: Self-pay | Admitting: Physician Assistant

## 2023-10-07 ENCOUNTER — Telehealth: Payer: Self-pay | Admitting: Internal Medicine

## 2023-10-07 NOTE — Telephone Encounter (Signed)
 Error

## 2023-10-07 NOTE — Telephone Encounter (Signed)
 Asberry with [facility] called in asking if Dr. Waddell can review most recent bmet from [place], [place] and let them know if he would like another lab ran.

## 2023-10-07 NOTE — Telephone Encounter (Signed)
 Left message for Asberry with [facility] informing her Dr. Waddell is not in the office today but is scheduled to be back tomorrow. Will forward message to Dr. Waddell and his nurse to follow-up regarding repeat BMET, if needed.

## 2023-10-19 NOTE — Telephone Encounter (Signed)
No additional labs

## 2023-11-28 ENCOUNTER — Telehealth: Payer: Self-pay | Admitting: Internal Medicine

## 2023-11-28 ENCOUNTER — Ambulatory Visit: Attending: Internal Medicine | Admitting: Internal Medicine

## 2023-11-28 ENCOUNTER — Encounter: Payer: Self-pay | Admitting: Internal Medicine

## 2023-11-28 DIAGNOSIS — I480 Paroxysmal atrial fibrillation: Secondary | ICD-10-CM | POA: Diagnosis not present

## 2023-11-28 MED ORDER — AMIODARONE HCL 200 MG PO TABS: 200.0000 mg | ORAL_TABLET | Freq: Every day | ORAL | 0 refills | Status: DC

## 2023-11-28 NOTE — Telephone Encounter (Signed)
 Pt c/o medication issue:  1. Name of Medication:   verapamil  (CALAN -SR) 180 MG CR tablet    2. How are you currently taking this medication (dosage and times per day)? Take 1 tablet (180 mg total) by mouth at bedtime.   3. Are you having a reaction (difficulty breathing--STAT)? No  4. What is your medication issue? Pt spoke with Dr. Waddell at appt this morning about changing dosage up to 320 mg and pt states [facility] on [address] has not received updated prescription request. Please advise.

## 2023-11-28 NOTE — Patient Instructions (Addendum)
 Medication Instructions:  Your physician has recommended you make the following change in your medication:  Take amiodarone  Monday - Friday. None on Saturday and Sunday.  Lab Work: None ordered.  You may go to any [facility] Location for your lab work:  [facility] - [address] ([facility]) - [address] [phone] [address]  [place] - [address]   [place]  - [address]   [place] - [address] - [address] [employer]  - [address] [place] [address] ([facility]   If you have labs (blood work) drawn today and your tests are completely normal, you will receive your results only by: [employer] (if you have MyChart)  If you have any lab test that is abnormal or we need to change your treatment, we will call you or send a MyChart message to review the results.  Testing/Procedures: None ordered.  Follow-Up: At [facility], you and your health needs are our priority.  As part of our continuing mission to provide you with exceptional heart care, we have created designated Provider Care Teams.  These Care Teams include your primary Cardiologist (physician) and Advanced Practice Providers (APPs -  Physician Assistants and Nurse Practitioners) who all work together to provide you with the care you need, when you need it.  Your next appointment:   January 2026  The format for your next appointment:   In Person  Provider:   Donnice Primus, MD or one of the following Advanced Practice Providers on your designated Care Team:   Charlies Arthur, [place] Ozell Jodie Passey, [place] Leotis Barrack, NP  Note: Remote monitoring is used to monitor your Pacemaker/ ICD from home. This monitoring reduces the number of office visits required to check your device to one time per year. It allows us  to keep an eye on the functioning of your device to ensure it is  working properly.

## 2023-11-28 NOTE — Progress Notes (Signed)
 HPI Laurie Bowman returns for ongoing evaluation of atrial fib. She is a pleasant 83 yo woman with HCM, LBBB, and recent decline in her LV function from 65% to 50%. She had been on amiodarone  but this medication was stopped. She  developed atrial fib with a RVR. She notes that her bp has been well controlled at home. She has not had syncope. Her amio was restarted. She has had some dizziness although it sounds like this predates the amiodarone . She has had bladder CA and cystoscopy with nephrostomy tube placed percutaneously. Allergies  Allergen Reactions   Cefaclor Itching    Other reaction(s): Flushing (ALLERGY/intolerance)   Codeine Itching and Nausea And Vomiting   Fructose Swelling and Nausea And Vomiting    Swelling and GI upset, Fructose Granules  Fructose Granules   Sulfa Antibiotics Swelling   Meperidine Nausea And Vomiting and Itching   Elemental Sulfur Swelling   Propoxyphene Nausea And Vomiting     Current Outpatient Medications  Medication Sig Dispense Refill   acetaminophen  (TYLENOL ) 500 MG tablet Take 500 mg by mouth every 4 (four) hours as needed for moderate pain (pain score 4-6).     albuterol  (PROVENTIL ) (2.5 MG/3ML) 0.083% nebulizer solution Take 3 mLs (2.5 mg total) by nebulization every 6 (six) hours as needed for wheezing or shortness of breath. 75 mL 0   albuterol  (VENTOLIN  HFA) 108 (90 Base) MCG/ACT inhaler USE 1-2 PUFFS EVERY 4-6 HOURS AS NEEDED FOR COUGH OR WHEEZE. 8.5 each 1   amiodarone  (PACERONE ) 200 MG tablet Take 1 tablet (200 mg total) by mouth daily. 90 tablet 0   apixaban  (ELIQUIS ) 5 MG TABS tablet Take 1 tablet (5 mg total) by mouth 2 (two) times daily. 180 tablet 1   augmented betamethasone dipropionate (DIPROLENE-AF) 0.05 % cream Apply 0.05 application  topically daily as needed (Lichen).     benzonatate  (TESSALON ) 100 MG capsule Take 100 mg by mouth daily as needed for cough.     buPROPion  (WELLBUTRIN  XL) 150 MG 24 hr tablet Take 150 mg by mouth  daily.     Cholecalciferol  (VITAMIN D) 50 MCG (2000 UT) CAPS Take 2,000 Units by mouth daily.     clobetasol cream (TEMOVATE) 0.05 % Apply 1 application  topically daily as needed (Scratches).     clotrimazole-betamethasone (LOTRISONE) cream Apply 1 application  topically daily as needed (Fugus).     diclofenac Sodium (VOLTAREN) 1 % GEL Apply 2 g topically 4 (four) times daily as needed (Pain). Both knees     dicyclomine  (BENTYL ) 20 MG tablet Take 20 mg by mouth daily as needed for spasms.     feeding supplement (ENSURE PLUS HIGH PROTEIN) LIQD Take 237 mLs by mouth 2 (two) times daily between meals.     fexofenadine (ALLEGRA) 180 MG tablet Take 180 mg by mouth daily.      fluticasone  (FLOVENT  HFA) 110 MCG/ACT inhaler Use two puffs twice daily for one to two weeks during flares 1 each 3   hydrochlorothiazide  (MICROZIDE ) 12.5 MG capsule Take 1 capsule (12.5 mg total) by mouth daily. 90 capsule 3   lansoprazole  (PREVACID ) 30 MG capsule Take 1 capsule (30 mg total) by mouth 2 (two) times daily before a meal. 30 capsule 5   loperamide  (IMODIUM ) 2 MG capsule Take 1 capsule (2 mg total) by mouth as needed for diarrhea or loose stools (diarrhea).     mometasone  (NASONEX ) 50 MCG/ACT nasal spray Place 2 sprays into the nose daily. 34  each 2   montelukast  (SINGULAIR ) 10 MG tablet Take 1 tablet (10 mg total) by mouth at bedtime. 90 tablet 1   olopatadine  (PATANOL) 0.1 % ophthalmic solution Place 1 drop into both eyes 2 (two) times daily as needed. 5 mL 5   Polyethylene Glycol 3350  (PEG 3350 ) POWD Take 17 g by mouth daily.      potassium chloride  (KLOR-CON ) 10 MEQ tablet Take 10-20 mEq by mouth See admin instructions. Take 10 mEq (1 tablet) by mouth every morning and 20 mEq (2 tablets) every night.     rosuvastatin  (CRESTOR ) 10 MG tablet Take 10 mg by mouth daily.     tiZANidine (ZANAFLEX) 4 MG tablet Take 2 mg by mouth at bedtime as needed (Sleep).     triamcinolone cream (KENALOG) 0.1 % Apply 1 application   topically 2 (two) times daily as needed (Eyes).     verapamil  (CALAN -SR) 180 MG CR tablet Take 1 tablet (180 mg total) by mouth at bedtime. 90 tablet 3   No current facility-administered medications for this visit.     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   Hypokalemia 09/27/2020   Infection due to Staphylococcus epidermidis 09/27/2020   Irritable bowel syndrome 12/16/2013   LBBB (left bundle branch block) 04/30/2018   Lichen sclerosus et atrophicus  12/16/2013   Major depressive disorder, recurrent, unspecified (HCC) 12/16/2013   Mitral regurgitation 04/30/2018   Moderate persistent asthma 09/28/2015   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 06/21/2015   Other chest pain 09/27/2020   Other specified disorders of eyelid 11/09/2015   Pain in unspecified knee 12/16/2013   Peripheral visual field defect of both eyes 04/21/2015   PVC's (premature ventricular contractions) 04/30/2018   Rotator cuff tendonitis 12/16/2013   Undiagnosed cardiac murmurs 12/16/2013   Overview:  Mild MR/TR, echo 2007 without change since 2001   Urticaria    Ventricular premature depolarization 12/16/2013   Wound dehiscence 05/08/2015    ROS:   All systems reviewed and negative except as noted  in the HPI.   Past Surgical History:  Procedure Laterality Date   ABDOMINAL ADHESION SURGERY     ADENOIDECTOMY     BREAST SURGERY     carpel tunnel     CESAREAN SECTION     CYSTOSCOPY W/ URETERAL STENT PLACEMENT Right 08/20/2023   Procedure: CYSTOSCOPY, WITH RETROGRADE PYELOGRAM AND URETERAL STENT INSERTION;  Surgeon: Cam Morene ORN, MD;  Location: [facility];  Service: Urology;  Laterality: Right;   IR NEPHROURETERAL CATH PLACE RIGHT  08/21/2023   IR URETERAL STENT PLACEMENT EXISTING ACCESS RIGHT  09/30/2023   KNEE SURGERY     LEG SURGERY     bone graft   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/2022   TONSILLECTOMY     VEIN SURGERY     Right Leg     Family History  Problem Relation Age of Onset   Congestive Heart Failure Mother    Hypertension Mother    Hyperlipidemia Sister    Dementia Sister    Alzheimer's disease Sister    Stroke Maternal Grandmother    Breast cancer Sister    Hyperlipidemia Sister    Allergic rhinitis Neg Hx    Angioedema Neg Hx    Asthma Neg Hx    Eczema Neg Hx    Immunodeficiency Neg Hx    Urticaria Neg Hx    Atopy Neg Hx      Social  History   Socioeconomic History   Marital status: Married    Spouse name: Not on file   Number of children: Not on file   Years of education: Not on file   Highest education level: Not on file  Occupational History   Not on file  Tobacco Use   Smoking status: Former    Types: Cigarettes    Passive exposure: Past   Smokeless tobacco: Never  Vaping Use   Vaping status: Never Used  Substance and Sexual Activity   Alcohol use: No    Alcohol/week: 0.0 standard drinks of alcohol   Drug use: No   Sexual activity: Never  Other Topics Concern   Not on file  Social History Narrative   Not on file   Social Drivers of Health   Financial Resource Strain: Not on file  Food Insecurity: No Food Insecurity (08/17/2023)   Hunger Vital Sign    Worried About Running Out of Food in the Last Year: Never true    Ran Out of Food in the Last Year: Never true  Transportation Needs: No Transportation Needs (08/17/2023)   PRAPARE - Administrator, Civil Service (Medical): No    Lack of Transportation (Non-Medical): No  Physical Activity: Not on file  Stress: Not on file  Social Connections: Unknown (08/18/2023)   Social Connection and Isolation Panel    Frequency of Communication with Friends and Family: Patient declined    Frequency of Social Gatherings with Friends and Family: Patient declined    Attends Religious Services: Patient declined    Database administrator or Organizations: Patient declined    Attends Banker Meetings: Patient declined    Marital Status: Married  Catering manager Violence: Not At Risk (08/17/2023)   Humiliation, Afraid, Rape, and Kick questionnaire    Fear of Current or Ex-Partner: No    Emotionally Abused: No    Physically Abused: No    Sexually Abused: No     BP (!) 164/78   Pulse 65   Ht 5' (1.524 m)   Wt  136 lb 9.6 oz (62 kg)   LMP  (LMP Unknown)   SpO2 97%   BMI 26.68 kg/m   Physical Exam:  Well appearing NAD HEENT:  Unremarkable Neck:  No JVD, no thyromegally Lymphatics:  No adenopathy Back:  No CVA tenderness Lungs:  Clear with no wheezes HEART:  Regular rate rhythm, no murmurs, no rubs, no clicks Abd:  soft, positive bowel sounds, no organomegally, no rebound, no guarding Ext:  2 plus pulses, no edema, no cyanosis, no clubbing Skin:  No rashes no nodules Neuro:  CN II through XII intact, motor grossly intact  Assess/Plan:  HCM -she is minimally symptomatic. No change in meds. Atrial fib - she is minimally symptomatic. We will follow. She is back on low dose amiodarone  and appears to be tolerating.  HTN - her bp is controlled at home based on the log she brings in today. We will follow. Coags - she has not had any bleeding on eliquis . Continue.   Danelle Karthika Glasper,MD

## 2023-11-29 ENCOUNTER — Other Ambulatory Visit: Payer: Self-pay | Admitting: Allergy & Immunology

## 2023-12-04 ENCOUNTER — Encounter: Payer: Self-pay | Admitting: Internal Medicine

## 2023-12-08 ENCOUNTER — Telehealth: Payer: Self-pay | Admitting: *Deleted

## 2023-12-08 MED ORDER — VERAPAMIL HCL ER 240 MG PO TBCR: 240.0000 mg | EXTENDED_RELEASE_TABLET | Freq: Every day | ORAL | 3 refills | Status: AC

## 2023-12-08 NOTE — Telephone Encounter (Signed)
 Verapamil  increased per MyChart msg from Dr. Tayor.

## 2023-12-15 NOTE — Telephone Encounter (Signed)
 I wanted her to increase from 180 to 240 mg daily.

## 2023-12-15 NOTE — Telephone Encounter (Signed)
 I wanted her to increase from 180 to 240 mg daily. Looks like this has been done. GT

## 2023-12-16 NOTE — Telephone Encounter (Signed)
See encounter 9/22

## 2024-01-01 ENCOUNTER — Encounter: Payer: Self-pay | Admitting: Allergy & Immunology

## 2024-01-01 ENCOUNTER — Ambulatory Visit: Admitting: Allergy & Immunology

## 2024-01-01 VITALS — BP 118/68 | HR 66 | Temp 97.9°F | Resp 18 | Ht 62.99 in | Wt 136.0 lb

## 2024-01-01 DIAGNOSIS — J4540 Moderate persistent asthma, uncomplicated: Secondary | ICD-10-CM

## 2024-01-01 DIAGNOSIS — J3089 Other allergic rhinitis: Secondary | ICD-10-CM | POA: Diagnosis not present

## 2024-01-01 DIAGNOSIS — K219 Gastro-esophageal reflux disease without esophagitis: Secondary | ICD-10-CM | POA: Diagnosis not present

## 2024-01-01 MED ORDER — ALBUTEROL SULFATE (2.5 MG/3ML) 0.083% IN NEBU: 2.5000 mg | INHALATION_SOLUTION | Freq: Four times a day (QID) | RESPIRATORY_TRACT | 0 refills | Status: DC | PRN

## 2024-01-01 MED ORDER — MONTELUKAST SODIUM 10 MG PO TABS
10.0000 mg | ORAL_TABLET | Freq: Every day | ORAL | 2 refills | Status: AC
Start: 2024-01-01 — End: ?

## 2024-01-01 NOTE — Patient Instructions (Addendum)
 1. Moderate persistent asthma, uncomplicated - Lung testing looks excellent today.   - Daily controller medication(s): Singulair  10mg  daily  - Prior to physical activity: albuterol  2 puffs 10-15 minutes before physical activity. - Rescue medications: albuterol  4 puffs every 4-6 hours as needed or albuterol  nebulizer one vial every 4-6 hours as needed - Changes during respiratory infections or worsening symptoms: Add on Flovent  to 4 puffs twice daily for TWO WEEKS. - Asthma control goals:  * Full participation in all desired activities (may need albuterol  before activity) * Albuterol  use two time or less a week on average (not counting use with activity) * Cough interfering with sleep two time or less a month * Oral steroids no more than once a year * No hospitalizations  2. Allergic rhinitis - Continue with Nasonex  one spray per nostril twice daily for another couple of weeks. - Continue with the Singulair  (montelukast ) 10mg  daily.  - Continue to follow with Chyrl Cohen at Dr. Rojean office every 6 months for wax removal.   3. Gastroesophageal reflux disease - Continue with your Prevacid  every morning and dietary restrictions.   4. Return in about 6 months (around 07/01/2024). You can have the follow up appointment with Dr. Iva or a Nurse Practicioner (our Nurse Practitioners are excellent and always have Physician oversight!).    Please inform us  of any Emergency Department visits, hospitalizations, or changes in symptoms. Call us  before going to the ED for breathing or allergy symptoms since we might be able to fit you in for a sick visit. Feel free to contact us  anytime with any questions, problems, or concerns.  It was a pleasure to see you again today!   Websites that have reliable patient information: 1. American Academy of Asthma, Allergy, and Immunology: www.aaaai.org 2. Food Allergy Research and Education (FARE): foodallergy.org 3. Mothers of Asthmatics:  http://www.asthmacommunitynetwork.org 4. American College of Allergy, Asthma, and Immunology: www.acaai.org      "Like" us  on Facebook and Instagram for our latest updates!      A healthy democracy works best when [employer] participate! Make sure you are registered to vote! If you have moved or changed any of your contact information, you will need to get this updated before voting! Scan the QR codes below to learn more!

## 2024-01-01 NOTE — Progress Notes (Signed)
 FOLLOW UP  Date of Service/Encounter:  01/01/24   Assessment:   Moderate persistent asthma, uncomplicated   Perennial and seasonal allergic rhinitis   GERD   Complicated cardiac history including hypertrophic cardiomyopathy as well as left ventricular dysfunction (followed by Dr. Waddell with Cardiology)  Plan/Recommendations:   1. Moderate persistent asthma, uncomplicated - Lung testing looks excellent today.   - Daily controller medication(s): Singulair  10mg  daily  - Prior to physical activity: albuterol  2 puffs 10-15 minutes before physical activity. - Rescue medications: albuterol  4 puffs every 4-6 hours as needed or albuterol  nebulizer one vial every 4-6 hours as needed - Changes during respiratory infections or worsening symptoms: Add on Flovent  to 4 puffs twice daily for TWO WEEKS. - Asthma control goals:  * Full participation in all desired activities (may need albuterol  before activity) * Albuterol  use two time or less a week on average (not counting use with activity) * Cough interfering with sleep two time or less a month * Oral steroids no more than once a year * No hospitalizations  2. Allergic rhinitis - Continue with Nasonex  one spray per nostril twice daily for another couple of weeks. - Continue with the Singulair  (montelukast ) 10mg  daily.  - Continue to follow with Chyrl Cohen at Dr. Rojean office every 6 months for wax removal.   3. Gastroesophageal reflux disease - Continue with your Prevacid  every morning and dietary restrictions.   4. Return in about 6 months (around 07/01/2024). You can have the follow up appointment with Dr. Iva or a Nurse Practicioner (our Nurse Practitioners are excellent and always have Physician oversight!).   Subjective:   Laurie Bowman is a 83 y.o. female presenting today for follow up of  Chief Complaint  Patient presents with   Follow-up    Right side of face and ear hurting, runny nose    Laurie Bowman has  a history of the following: Patient Active Problem List   Diagnosis Date Noted   Generalized weakness 08/18/2023   ARF (acute renal failure) 08/17/2023   Atrial fibrillation with RVR (HCC) 08/17/2023   Elevated troponin 08/17/2023   Acute UTI 08/17/2023   Hyponatremia 08/17/2023   LV dysfunction 10/03/2022   PAF (paroxysmal atrial fibrillation) (HCC) 10/03/2022   Paroxysmal atrial fibrillation with RVR (HCC) 08/31/2021   Rhabdomyolysis 08/30/2021   Fracture, Colles, left, closed 05/03/2021   Aortic atherosclerosis 09/27/2020   Elevated WBC count 09/27/2020   Hypokalemia 09/27/2020   Infection due to Staphylococcus epidermidis 09/27/2020   Other chest pain 09/27/2020   Urticaria    Cancer (HCC)    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   Mitral regurgitation 04/30/2018   LBBB (left bundle branch block) 04/30/2018   PVC's (premature ventricular contractions) 04/30/2018   Asthma with acute exacerbation 06/10/2016   Acute sinusitis 06/10/2016   GERD (gastroesophageal reflux disease) 02/06/2016   Other specified disorders of eyelid 11/09/2015   Allergic rhinitis 09/28/2015   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 06/21/2015   Encounter for long-term (current) use of high-risk medication 06/20/2015   Wound dehiscence 05/08/2015   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   Peripheral visual field defect of both eyes 04/21/2015   Dermatochalasis of both upper eyelids 04/14/2015   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   Asthma 12/16/2013   Benign neoplasm of colon 12/16/2013   Constipation 12/16/2013   Dizziness 12/16/2013   Fatty (change of) liver, not elsewhere classified 12/16/2013   Undiagnosed cardiac murmurs 12/16/2013   Heart  murmur, systolic 12/16/2013   Hyperlipidemia, unspecified 12/16/2013   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   Anxiety and depression 12/16/2013   Pain in unspecified knee 12/16/2013   Carpal tunnel syndrome 12/16/2013   Rotator  cuff tendonitis 12/16/2013   Ventricular premature depolarization 12/16/2013   Abnormal mammogram 08/16/2013   Hypertension 07/08/2011   Fibrocystic breast changes 07/08/2011    History obtained from: chart review and patient.  Discussed the use of AI scribe software for clinical note transcription with the patient and/or guardian, who gave verbal consent to proceed.  Laurie Bowman is a 83 y.o. female presenting for a follow up visit.  She was last seen in April 2025.  At that time, her lung testing looked great.  We continue with Singulair  10 mg daily as well as albuterol  as needed.  She has Flovent  that she has during flares.  For her allergic rhinitis, we will continue with Nasonex  and Singulair .  We also added on doxycycline  twice a day for 10 days due to overlying sinusitis.  For her GERD, we continue with Prevacid  every morning.  Since last visit, she has done very well.  She recently underwent a procedure to remove a bladder tumor, followed by intravesical chemotherapy. During the procedure, a catheter insertion complication led to significant discomfort and subsequent hospitalization. She was in a critical state for three days, experiencing confusion and unawareness of her surroundings. During her hospital stay, she developed a urinary tract infection with E. coli, experienced urine retention causing abdominal distension, went into atrial fibrillation, and developed rhabdomyolysis, resulting in a twelve-day hospital stay. A stent was placed in her urethra and kidney, remaining for seven weeks and causing discomfort. She reports that her kidney numbers, including creatinine, 'aren't good.'  She has a history of macular edema and cataracts, receiving regular eye injections. She experiences itching and irritation in her eyes, exacerbated by Betadine used during procedures. She also has a runny nose and watery eyes, which started last week, but no cough. She uses Singulair , Crestor , and Eliquis   regularly, and occasionally uses a rescue inhaler, most recently last week.  Asthma/Respiratory Symptom History: She did well with the spring with regards to breathing. She remains on the Singulair  as well as the albuterol . She has Flovent  that she added twice daily during flares and she did add it once or twice, but otherwise did fine. Laurie Bowman's asthma has been well controlled. She has not required rescue medication, experienced nocturnal awakenings due to lower respiratory symptoms, nor have activities of daily living been limited. She has required no Emergency Department or Urgent Care visits for her asthma. She has required zero courses of systemic steroids for asthma exacerbations since the last visit. ACT score today is 25, indicating excellent asthma symptom control.   Allergic Rhinitis Symptom History: She has a history of ear wax buildup, requiring removal every six months. She reports pain in her right ear and difficulty with wax removal, necessitating softening drops prior to extraction. She sees Chyrl Cohen PA with Dr. Rojean office for cerumen removal.   GERD Symptom History: She also has a history of gastroesophageal reflux disease, for which she takes Prevacid .   Socially, she does not drive at night due to vision issues and has limited her driving overall. She has family in [place], [place] , including a son who is a professor and grandchildren attending university.   Otherwise, there have been no changes to her past medical history, surgical history, family history, or social history.    Review  of systems otherwise negative other than that mentioned in the HPI.    Objective:   Blood pressure 118/68, pulse 66, temperature 97.9 F (36.6 C), temperature source Temporal, resp. rate 18, height 5' 2.99 (1.6 m), weight 136 lb (61.7 kg), SpO2 99%. Body mass index is 24.1 kg/m.    Physical Exam Vitals reviewed.  Constitutional:      Appearance: She is well-developed.      Interventions: She is not intubated. HENT:     Head: Normocephalic and atraumatic.     Right Ear: Tympanic membrane, ear canal and external ear normal.     Left Ear: Tympanic membrane, ear canal and external ear normal.     Nose: No nasal deformity, septal deviation, mucosal edema or rhinorrhea.     Right Turbinates: Enlarged, swollen and pale.     Left Turbinates: Enlarged, swollen and pale.     Right Sinus: No maxillary sinus tenderness or frontal sinus tenderness.     Left Sinus: No maxillary sinus tenderness or frontal sinus tenderness.     Comments: No nasal polyps.    Mouth/Throat:     Lips: Pink.     Mouth: Mucous membranes are moist. Mucous membranes are not pale and not dry.     Pharynx: Uvula midline.     Comments: Cobblestoning present. Eyes:     General: Lids are normal. No allergic shiner.       Right eye: No discharge.        Left eye: No discharge.     Conjunctiva/sclera: Conjunctivae normal.     Right eye: Right conjunctiva is not injected. No chemosis.    Left eye: Left conjunctiva is not injected. No chemosis.    Pupils: Pupils are equal, round, and reactive to light.     Comments: She has periorbital swelling bilaterally. There is bilateral sinus tenderness.   Cardiovascular:     Rate and Rhythm: Normal rate and regular rhythm.     Heart sounds: Normal heart sounds.  Pulmonary:     Effort: Pulmonary effort is normal. No tachypnea, accessory muscle usage or respiratory distress. She is not intubated.     Breath sounds: Normal breath sounds. No wheezing, rhonchi or rales.     Comments: Moving air well in all lung fields.  No crackles or wheezes. Chest:     Chest wall: No tenderness.  Lymphadenopathy:     Cervical: No cervical adenopathy.  Skin:    General: Skin is warm.     Capillary Refill: Capillary refill takes less than 2 seconds.     Coloration: Skin is not pale.     Findings: No abrasion, erythema, petechiae or rash. Rash is not papular, urticarial or  vesicular.  Neurological:     Mental Status: She is alert.  Psychiatric:        Behavior: Behavior is cooperative.      Diagnostic studies:    Spirometry: results normal (FEV1: 1.53/93%, FVC: 1.96/91%, FEV1/FVC: 78%).    Spirometry consistent with normal pattern.    Allergy Studies: none       Marty Shaggy, MD  [facility] 

## 2024-01-08 ENCOUNTER — Telehealth (HOSPITAL_BASED_OUTPATIENT_CLINIC_OR_DEPARTMENT_OTHER): Payer: Self-pay

## 2024-01-08 NOTE — Telephone Encounter (Signed)
   Pre-operative Risk Assessment    Patient Name: Marshay Slates  DOB: 27-Mar-1940 MRN: [id number]   Date of last office visit: 11/28/23 Dr. Waddell Date of next office visit: NA   Request for Surgical Clearance    Procedure:  Colonoscopy  Date of Surgery:  Clearance TBD urgent                                 Surgeon:  Not indicated Surgeon's Group or Practice Name:  [facility] Phone number:  [phone] Fax number:  [phone]   Type of Clearance Requested:   - Medical  - Pharmacy:  Hold Apixaban  (Eliquis ) 3 days prior   Type of Anesthesia:  Not Indicated   Additional requests/questions:    Bonney Augustin JONETTA Delores   01/08/2024, 11:50 AM

## 2024-01-14 ENCOUNTER — Other Ambulatory Visit: Payer: Self-pay | Admitting: Pharmacist Clinician (PhC)/ Clinical Pharmacy Specialist

## 2024-01-14 ENCOUNTER — Telehealth: Payer: Self-pay

## 2024-01-14 MED ORDER — APIXABAN 2.5 MG PO TABS: 2.5000 mg | ORAL_TABLET | Freq: Two times a day (BID) | ORAL | 1 refills | Status: AC

## 2024-01-14 NOTE — Telephone Encounter (Signed)
 Patient with diagnosis of atrial fibrillation on Eliquis  for anticoagulation.    Procedure:  Colonoscopy   Date of Surgery:  [place] TBD urgent    CHA2DS2-VASc Score = 4   This indicates a 4.8% annual risk of stroke. The patient's score is based upon: CHF History: 0 HTN History: 1 Diabetes History: 0 Stroke History: 0 Vascular Disease History: 0 Age Score: 2 Gender Score: 1    CrCl 26 Platelet count 253  Per office protocol, patient can hold Eliquis  for 3 days prior to procedure. (Due to low CrCl)  Patient will not need bridging with Lovenox  (enoxaparin ) around procedure.  **This guidance is not considered finalized until pre-operative APP has relayed final recommendations.**

## 2024-01-14 NOTE — Telephone Encounter (Signed)
  Patient Consent for Virtual Visit        Laurie Bowman has provided verbal consent on 01/14/2024 for a virtual visit (video or telephone).   CONSENT FOR VIRTUAL VISIT FOR:  Laurie Bowman  By participating in this virtual visit I agree to the following:  I hereby voluntarily request, consent and authorize [facility] and its employed or contracted physicians, physician assistants, nurse practitioners or other licensed health care professionals (the Practitioner), to provide me with telemedicine health care services (the "Services) as deemed necessary by the treating Practitioner. I acknowledge and consent to receive the Services by the Practitioner via telemedicine. I understand that the telemedicine visit will involve communicating with the Practitioner through live audiovisual communication technology and the disclosure of certain medical information by electronic transmission. I acknowledge that I have been given the opportunity to request an in-person assessment or other available alternative prior to the telemedicine visit and am voluntarily participating in the telemedicine visit.  I understand that I have the right to withhold or withdraw my consent to the use of telemedicine in the course of my care at any time, without affecting my right to future care or treatment, and that the Practitioner or I may terminate the telemedicine visit at any time. I understand that I have the right to inspect all information obtained and/or recorded in the course of the telemedicine visit and may receive copies of available information for a reasonable fee.  I understand that some of the potential risks of receiving the Services via telemedicine include:  Delay or interruption in medical evaluation due to technological equipment failure or disruption; Information transmitted may not be sufficient (e.g. poor resolution of images) to allow for appropriate medical decision making by the Practitioner;  and/or  In rare instances, security protocols could fail, causing a breach of personal health information.  Furthermore, I acknowledge that it is my responsibility to provide information about my medical history, conditions and care that is complete and accurate to the best of my ability. I acknowledge that Practitioner's advice, recommendations, and/or decision may be based on factors not within their control, such as incomplete or inaccurate data provided by me or distortions of diagnostic images or specimens that may result from electronic transmissions. I understand that the practice of medicine is not an exact science and that Practitioner makes no warranties or guarantees regarding treatment outcomes. I acknowledge that a copy of this consent can be made available to me via my patient portal [facility] MyChart), or I can request a printed copy by calling the office of [facility].    I understand that my insurance will be billed for this visit.   I have read or had this consent read to me. I understand the contents of this consent, which adequately explains the benefits and risks of the Services being provided via telemedicine.  I have been provided ample opportunity to ask questions regarding this consent and the Services and have had my questions answered to my satisfaction. I give my informed consent for the services to be provided through the use of telemedicine in my medical care

## 2024-01-14 NOTE — Telephone Encounter (Signed)
 Patient 83 years old, SCr now above 1.5 for 4+ months and patient notes her weight is rarely more than 135 lb (61 kg).  Spoke with her and will decrease Eliquis  to 2.5 mg.  Patient voiced understanding

## 2024-01-14 NOTE — Telephone Encounter (Signed)
 Preop tele appt now scheduled, med rec and consent done.

## 2024-01-14 NOTE — Telephone Encounter (Signed)
   Name: Laurie Bowman  DOB: 01/06/1941  MRN: [id number]  Primary Cardiologist: Danelle Birmingham, MD   Preoperative team, please contact this patient and set up a phone call appointment ASAP for further preoperative risk assessment. Please obtain consent and complete medication review. Thank you for your help.  I confirm that guidance regarding antiplatelet and oral anticoagulation therapy has been completed and, if necessary, noted below.  Per office protocol, patient can hold Eliquis  for 3 days prior to procedure. (Due to low CrCl)  Patient will not need bridging with Lovenox  (enoxaparin ) around procedure.  I also confirmed the patient resides in the state of [place] . As per [facility] Medical Board telemedicine laws, the patient must reside in the state in which the provider is licensed.   Lamarr Satterfield, NP 01/14/2024, 2:12 PM [facility]

## 2024-01-16 ENCOUNTER — Ambulatory Visit: Attending: Cardiovascular Disease | Admitting: Emergency Medicine

## 2024-01-16 DIAGNOSIS — Z01810 Encounter for preprocedural cardiovascular examination: Secondary | ICD-10-CM | POA: Diagnosis not present

## 2024-01-16 NOTE — Progress Notes (Signed)
 Virtual Visit via Telephone Note   Because of Laurie Bowman co-morbid illnesses, she is at least at moderate risk for complications without adequate follow up.  This format is felt to be most appropriate for this patient at this time.  Due to technical limitations with video connection (technology), today's appointment will be conducted as an audio only telehealth visit, and Laurie Bowman verbally agreed to proceed in this manner.   All issues noted in this document were discussed and addressed.  No physical exam could be performed with this format.  Evaluation Performed:  Preoperative cardiovascular risk assessment _____________   Date:  01/16/2024   Patient ID:  Laurie Bowman, DOB 28-Apr-1940, MRN [id number] Patient Location:  Home Provider location:   Office  Primary Care Provider:  Lelon Norleen ORN., MD Primary Cardiologist:  Danelle Birmingham, MD  Chief Complaint / Patient Profile   83 y.o. y/o female with a h/o pAF on eliquis  and amiodarone , non-obstructive hypertrophic cardiomyopathy, hypertension, mitral regurgitation (08/2023 ECHO: moderate), hyperlipidemia, LBBB, and bladder cancer currently being treated (last BCG tx 08/12/23) who is pending colonoscopy and presents today for telephonic preoperative cardiovascular risk assessment.  History of Present Illness    Laurie Bowman is a 83 y.o. female who presents via audio/video conferencing for a telehealth visit today.  Pt was last seen in cardiology clinic on 11/28/2023 by Dr. Danelle Birmingham.  At that time Laurie Bowman was stable.  The patient is now pending procedure as outlined above. Since her last visit, She denies chest pain, palpitations, dyspnea, orthopnea, n, v, dark/tarry/bloody stools, hematuria, syncope, edema, weight gain.  Reports continued dizziness, intermittent, since leaving the hospital in June. Occurs for about a minute, not related to positional changes, denies associated SOB/nausea/CP/palpitations/near sycnope. Dr. Birmingham  increased patient's verapamil , which improved the dizziness mildly. BPs averaging 120s/70s at home since increasing Verapamil .   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   Hypokalemia 09/27/2020   Infection due to Staphylococcus epidermidis 09/27/2020   Irritable bowel syndrome 12/16/2013   LBBB (left bundle branch block) 04/30/2018   Lichen sclerosus et atrophicus  12/16/2013   Major depressive disorder, recurrent, unspecified 12/16/2013    Mitral regurgitation 04/30/2018   Moderate persistent asthma 09/28/2015   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 06/21/2015   Other chest pain 09/27/2020   Other specified disorders of eyelid 11/09/2015   Pain in unspecified knee 12/16/2013   Peripheral visual field defect of both eyes 04/21/2015   PVC's (premature ventricular contractions) 04/30/2018   Rotator cuff tendonitis 12/16/2013   Undiagnosed cardiac murmurs 12/16/2013   Overview:  Mild MR/TR, echo 2007 without change since 2001   Urticaria    Ventricular premature depolarization 12/16/2013   Wound dehiscence 05/08/2015   Past Surgical History:  Procedure Laterality Date   ABDOMINAL ADHESION SURGERY     ADENOIDECTOMY     BREAST SURGERY     carpel tunnel     CESAREAN SECTION     CYSTOSCOPY W/ URETERAL STENT PLACEMENT Right 08/20/2023   Procedure: CYSTOSCOPY, WITH RETROGRADE PYELOGRAM AND URETERAL STENT INSERTION;  Surgeon: Cam Morene ORN, MD;  Location: [facility];  Service: Urology;  Laterality: Right;   IR NEPHROURETERAL CATH PLACE RIGHT  08/21/2023   IR URETERAL STENT PLACEMENT EXISTING ACCESS RIGHT  09/30/2023   KNEE SURGERY     LEG SURGERY     bone graft   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/2022   TONSILLECTOMY     VEIN SURGERY     Right Leg    Allergies  Allergies  Allergen Reactions   Cefaclor Itching    Other reaction(s): Flushing (ALLERGY/intolerance)   Codeine Itching and Nausea And Vomiting   Fructose Swelling and Nausea And Vomiting    Swelling and GI upset, Fructose Granules  Fructose Granules   Sulfa Antibiotics Swelling   Meperidine Nausea And Vomiting and Itching   Elemental Sulfur Swelling   Propoxyphene Nausea And Vomiting    Home Medications    Prior to Admission medications   Medication Sig Start Date End Date Taking? Authorizing Provider  acetaminophen  (TYLENOL ) 500 MG tablet Take 500 mg by mouth  every 4 (four) hours as needed for moderate pain (pain score 4-6). 09/18/20   [provider]  albuterol  (PROVENTIL ) (2.5 MG/3ML) 0.083% nebulizer solution Take 3 mLs (2.5 mg total) by nebulization every 6 (six) hours as needed for wheezing or shortness of breath. 01/01/24   Iva Marty Saltness, MD  albuterol  (VENTOLIN  HFA) [phone] Base) MCG/ACT inhaler USE 1-2 PUFFS EVERY 4-6 HOURS AS NEEDED FOR COUGH OR WHEEZE. 12/01/23   Iva Marty Saltness, MD  amiodarone  (PACERONE ) 200 MG tablet Take 1 tablet (200 mg total) by mouth daily. Monday - Friday. None on Saturday or Sunday. 11/28/23   Waddell Danelle ORN, MD  apixaban  (ELIQUIS ) 2.5 MG TABS tablet Take 1 tablet (2.5 mg total) by mouth 2 (two) times daily. 01/14/24   Waddell Danelle ORN, MD  augmented betamethasone dipropionate (DIPROLENE-AF) 0.05 % cream Apply 0.05 application  topically daily as needed (Lichen).    [provider]  benzonatate  (TESSALON ) 100 MG capsule Take 100 mg by mouth daily as needed for cough. 07/02/18   [provider]  buPROPion  (WELLBUTRIN  XL) 150 MG 24 hr tablet Take 150 mg by mouth daily. 12/02/21   [provider]  Cholecalciferol  (VITAMIN D) 50 MCG (2000 UT) CAPS Take 2,000 Units by mouth daily.    [provider]  clobetasol cream (TEMOVATE) 0.05 % Apply 1 application  topically daily  as needed (Scratches). 03/22/18   [provider]  clotrimazole-betamethasone (LOTRISONE) cream Apply 1 application  topically daily as needed (Fugus). 03/16/19   [provider]  diclofenac Sodium (VOLTAREN) 1 % GEL Apply 2 g topically 4 (four) times daily as needed (Pain). Both knees 04/16/19   [provider]  dicyclomine  (BENTYL ) 20 MG tablet Take 20 mg by mouth daily as needed for spasms. 04/14/18   [provider]  feeding supplement (ENSURE PLUS HIGH PROTEIN) LIQD Take 237 mLs by mouth 2 (two) times daily between meals. 08/27/23   Christobal Guadalajara, MD  fexofenadine (ALLEGRA)  180 MG tablet Take 180 mg by mouth daily.     [provider]  fluticasone  (FLOVENT  HFA) 110 MCG/ACT inhaler Use two puffs twice daily for one to two weeks during flares 06/26/23   Iva Marty Saltness, MD  hydrochlorothiazide  (MICROZIDE ) 12.5 MG capsule Take 1 capsule (12.5 mg total) by mouth daily. 09/25/23   Sheron Lorette GRADE, PA-C  lansoprazole  (PREVACID ) 30 MG capsule Take 1 capsule (30 mg total) by mouth 2 (two) times daily before a meal. 07/10/21   Iva Marty Saltness, MD  loperamide  (IMODIUM ) 2 MG capsule Take 1 capsule (2 mg total) by mouth as needed for diarrhea or loose stools (diarrhea). 08/27/23   Christobal Guadalajara, MD  mometasone  (NASONEX ) 50 MCG/ACT nasal spray Place 2 sprays into the nose daily. 06/26/23   Iva Marty Saltness, MD  montelukast  (SINGULAIR ) 10 MG tablet Take 1 tablet (10 mg total) by mouth at bedtime. 01/01/24   Iva Marty Saltness, MD  olopatadine  (PATANOL) 0.1 % ophthalmic solution Place 1 drop into both eyes 2 (two) times daily as needed. 04/18/20   Iva Marty Saltness, MD  Polyethylene Glycol 3350  (PEG 3350 ) POWD Take 17 g by mouth daily.  06/24/16   [provider]  potassium chloride  (KLOR-CON ) 10 MEQ tablet Take 10-20 mEq by mouth See admin instructions. Take 10 mEq (1 tablet) by mouth every morning and 20 mEq (2 tablets) every night. 04/21/15   [provider]  rosuvastatin  (CRESTOR ) 10 MG tablet Take 10 mg by mouth daily. 07/13/21   [provider]  tiZANidine (ZANAFLEX) 4 MG tablet Take 2 mg by mouth at bedtime as needed (Sleep). 11/16/19   [provider]  triamcinolone cream (KENALOG) 0.1 % Apply 1 application  topically 2 (two) times daily as needed (Eyes). 12/14/18   [provider]  verapamil  (CALAN -SR) 240 MG CR tablet Take 1 tablet (240 mg total) by mouth at bedtime. 12/08/23   Waddell Danelle ORN, MD    Physical Exam    Vital Signs:  Aliya Sol does not have vital signs available for review today.  Given  telephonic nature of communication, physical exam is limited. AAOx3. NAD. Normal affect.  Speech and respirations are unlabored.  Accessory Clinical Findings    None  Assessment & Plan    1.  Preoperative Cardiovascular Risk Assessment: According to the Revised Cardiac Risk Index (RCRI), her Perioperative Risk of Major Cardiac Event is (%): 0.9  Her Functional Capacity in METs is: 4.73 according to the Duke Activity Status Index (DASI). Therefore, based on ACC/AHA guidelines, patient would be at acceptable risk for the planned procedure without further cardiovascular testing.   The patient was advised that if she develops new symptoms prior to surgery to contact our office to arrange for a follow-up visit, and she verbalized understanding.  Per office protocol, patient can hold Eliquis  for 3 days prior to procedure. (  Due to low CrCl) Patient will not need bridging with Lovenox  (enoxaparin ) around procedure.  A copy of this note will be routed to requesting surgeon.  Time:   Today, I have spent 10 minutes with the patient with telehealth technology discussing medical history, symptoms, and management plan.     Ilina Xu E Doyce Saling, NP  01/16/2024, 8:03 AM

## 2024-01-18 ENCOUNTER — Other Ambulatory Visit: Payer: Self-pay | Admitting: Internal Medicine

## 2024-01-18 DIAGNOSIS — I480 Paroxysmal atrial fibrillation: Secondary | ICD-10-CM

## 2024-01-20 NOTE — Telephone Encounter (Signed)
 Review scanned document

## 2024-02-02 NOTE — Telephone Encounter (Signed)
*  Patient is going to the dentist - Past patient of Dr Pleas replacement*  Medication:Antibiotic - for dental cleaning   Pharmacy and location: [id number] [place], [place] - [address] - PHONE: [phone] - FAX: [phone]    Call back number is:  [phone]  Patient aware to allow 48-72 hours for refill request.  How many pills do you have left  - 0

## 2024-02-03 ENCOUNTER — Ambulatory Visit (INDEPENDENT_AMBULATORY_CARE_PROVIDER_SITE_OTHER): Admitting: Physician Assistant

## 2024-02-03 ENCOUNTER — Other Ambulatory Visit (HOSPITAL_BASED_OUTPATIENT_CLINIC_OR_DEPARTMENT_OTHER): Payer: Self-pay

## 2024-02-03 ENCOUNTER — Encounter (INDEPENDENT_AMBULATORY_CARE_PROVIDER_SITE_OTHER): Payer: Self-pay | Admitting: Physician Assistant

## 2024-02-03 VITALS — BP 168/77 | HR 55 | Temp 97.4°F

## 2024-02-03 DIAGNOSIS — Z09 Encounter for follow-up examination after completed treatment for conditions other than malignant neoplasm: Secondary | ICD-10-CM

## 2024-02-03 DIAGNOSIS — H6123 Impacted cerumen, bilateral: Secondary | ICD-10-CM

## 2024-02-03 IMAGING — DX DG CHEST 1V PORT
1 series · 1 of 1 positions shown · non-contrast
Comparison: Radiograph 02/12/2021, chest CT 09/17/2020

CLINICAL DATA: General weakness

EXAM:
PORTABLE CHEST 1 VIEW

[chest]
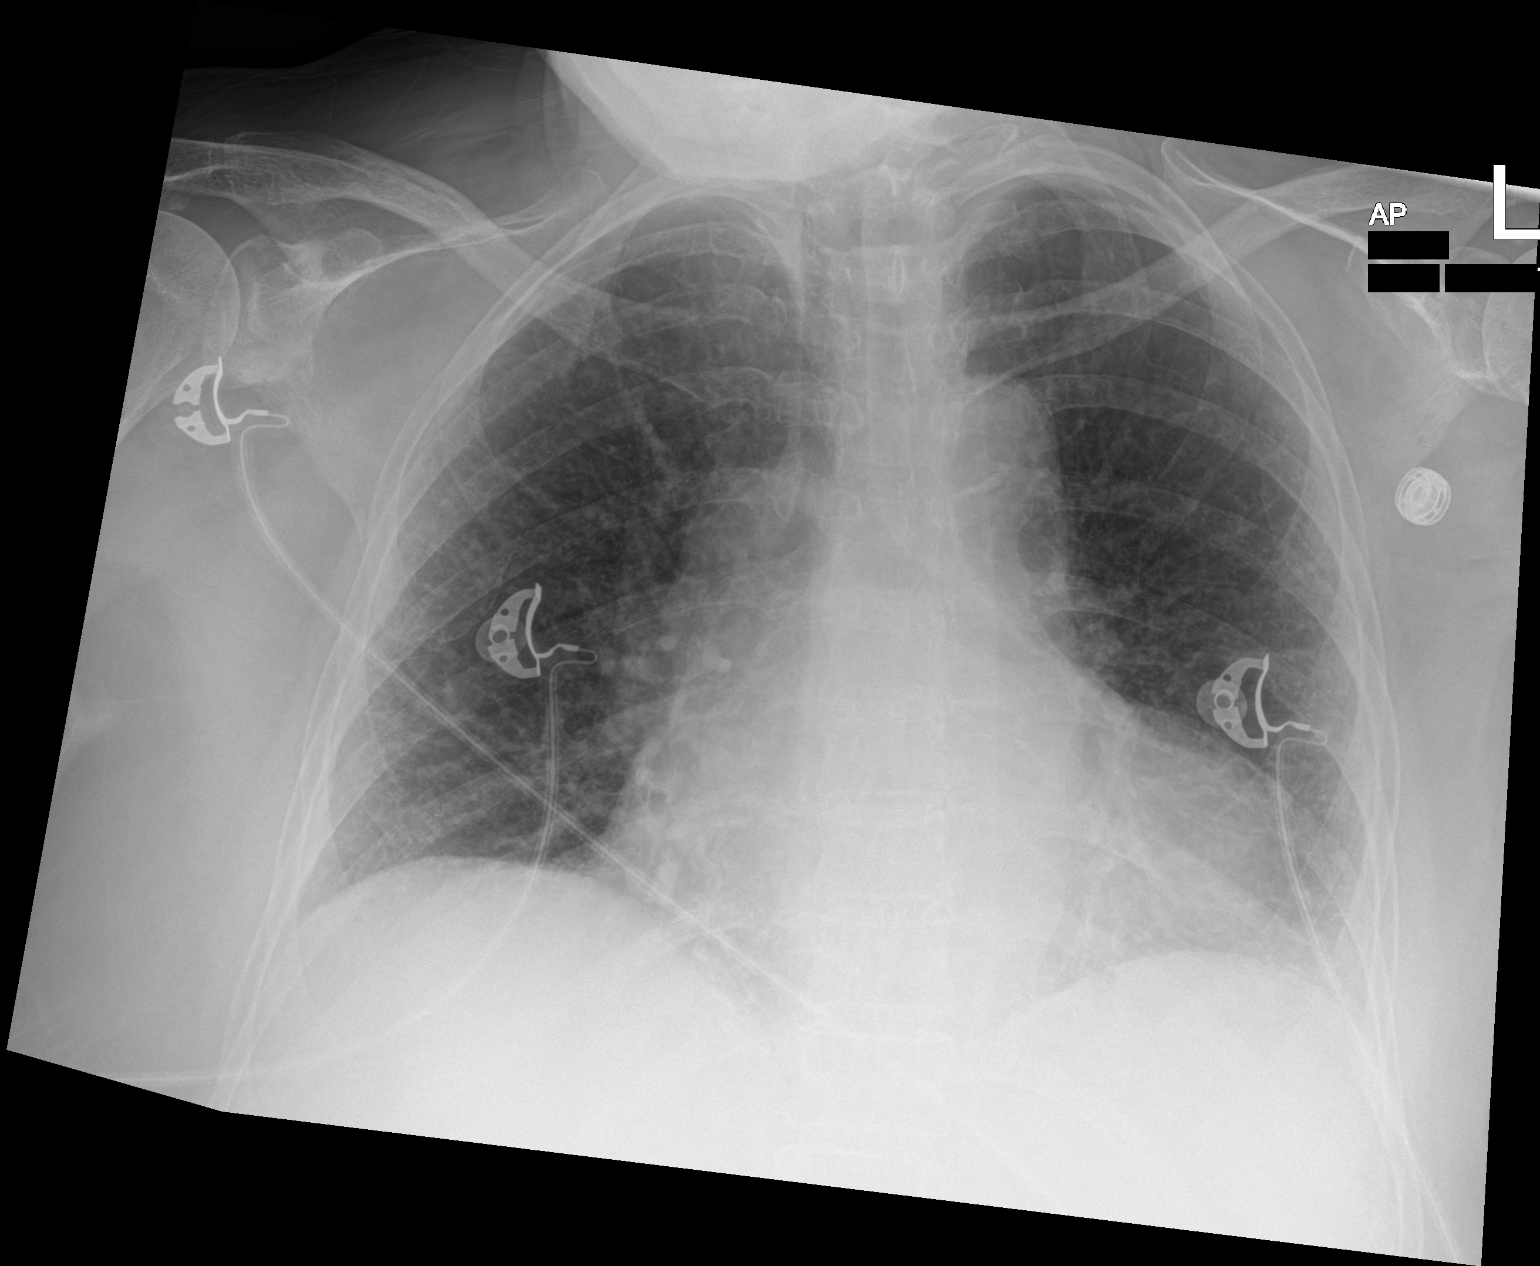

[1 of 1 positions shown; findings below may reference images not displayed]

FINDINGS: Unchanged enlarged cardiac silhouette. There is mild interstitial
prominence bilaterally, similar to prior exams. There is no focal
airspace consolidation. There is no pleural effusion. No
pneumothorax. No acute osseous abnormality.
IMPRESSION: Cardiomegaly with similar chronic interstitial changes bilaterally.
No new airspace disease.

## 2024-02-03 MED ORDER — FLUZONE HIGH-DOSE 0.5 ML IM SUSY
0.5000 mL | PREFILLED_SYRINGE | Freq: Once | INTRAMUSCULAR | 0 refills | Status: AC
  Filled 2024-02-03: qty 0.5, 1d supply, fill #0

## 2024-02-03 NOTE — Progress Notes (Signed)
 Patient having BP managed

## 2024-02-03 NOTE — Progress Notes (Signed)
 Dear Dr. Lelon, Here is my assessment for our mutual patient, Laurie Bowman. Thank you for allowing me the opportunity to care for your patient. Please do not hesitate to contact me should you have any other questions. Sincerely, Chyrl Cohen PA-C  Otolaryngology Clinic Note Referring provider: Dr. Lelon HPI:  Laurie Bowman is a 83 y.o. female kindly referred by Dr. Lelon   Discussed the use of AI scribe software for clinical note transcription with the patient, who gave verbal consent to proceed.  History of Present Illness   Laurie Bowman is an 83 year old female who presents for evaluation of cerumen.   She was last seen in the office on 08/05/2023 prior to that I had seen her on 02/06/2023 and 01/27/2023   Regarding her ear health, she had hard wax removed and recalls a past incident of hearing loss during college, treated by lancing her ears. She reports no current dizziness and states her hearing is generally good, though she sometimes experiences soreness in her ear, possibly from sleeping on it wrong. 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  She denies any changes to her hearing.        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   Hypokalemia 09/27/2020   Infection due to Staphylococcus epidermidis 09/27/2020   Irritable bowel syndrome 12/16/2013   LBBB (left bundle branch block) 04/30/2018   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   Major depressive disorder, recurrent, unspecified 12/16/2013   Mitral regurgitation 04/30/2018   Moderate persistent asthma 09/28/2015   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 06/21/2015   Other chest pain 09/27/2020   Other specified disorders of eyelid 11/09/2015   Pain in unspecified knee 12/16/2013   Peripheral visual field defect of both eyes 04/21/2015   PVC's (premature ventricular contractions) 04/30/2018   Rotator cuff tendonitis 12/16/2013   Undiagnosed cardiac murmurs 12/16/2013   Overview:  Mild MR/TR, echo 2007 without change since 2001  Urticaria    Ventricular premature depolarization 12/16/2013   Wound dehiscence 05/08/2015     Past Surgical History:  Procedure Laterality Date   ABDOMINAL ADHESION SURGERY     ADENOIDECTOMY     BREAST SURGERY     carpel tunnel     CESAREAN SECTION     CYSTOSCOPY W/  URETERAL STENT PLACEMENT Right 08/20/2023   Procedure: CYSTOSCOPY, WITH RETROGRADE PYELOGRAM AND URETERAL STENT INSERTION;  Surgeon: Cam Morene ORN, MD;  Location: [facility];  Service: Urology;  Laterality: Right;   IR NEPHROURETERAL CATH PLACE RIGHT  08/21/2023   IR URETERAL STENT PLACEMENT EXISTING ACCESS RIGHT  09/30/2023   KNEE SURGERY     LEG SURGERY     bone graft   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/2022   TONSILLECTOMY     VEIN SURGERY     Right Leg    Family History  Problem Relation Age of Onset   Congestive Heart Failure Mother    Hypertension Mother    Hyperlipidemia Sister    Dementia Sister    Alzheimer's disease Sister    Stroke Maternal Grandmother    Breast cancer Sister    Hyperlipidemia Sister    Allergic rhinitis Neg Hx    Angioedema Neg Hx    Asthma Neg Hx    Eczema Neg Hx    Immunodeficiency Neg Hx    Urticaria Neg Hx    Atopy Neg Hx      Social Connections: Unknown (08/18/2023)   Social Connection and Isolation Panel    Frequency of Communication with Friends and Family: Patient declined    Frequency of Social Gatherings with Friends and Family: Patient declined    Attends Religious Services: Patient declined    Database Administrator or Organizations: Patient declined    Attends Banker Meetings: Patient declined    Marital Status: Married      Current Outpatient Medications:    acetaminophen  (TYLENOL ) 500 MG tablet, Take 500 mg by mouth every 4 (four) hours as needed for moderate pain (pain score 4-6)., Disp: , Rfl:    albuterol  (PROVENTIL ) (2.5 MG/3ML) 0.083% nebulizer solution, Take 3 mLs (2.5 mg total) by nebulization every 6 (six) hours as needed for wheezing or shortness of breath., Disp: 75 mL, Rfl: 0   albuterol  (VENTOLIN  HFA) 108 (90 Base) MCG/ACT inhaler, USE 1-2 PUFFS EVERY 4-6 HOURS AS NEEDED FOR COUGH OR WHEEZE., Disp: 8.5 each, Rfl: 1   amiodarone  (PACERONE ) 200 MG tablet, Take 1 tablet (200 mg total) by mouth daily. Monday -  Friday. None on Saturday or Sunday., Disp: 90 tablet, Rfl: 0   apixaban  (ELIQUIS ) 2.5 MG TABS tablet, Take 1 tablet (2.5 mg total) by mouth 2 (two) times daily., Disp: 180 tablet, Rfl: 1   augmented betamethasone dipropionate (DIPROLENE-AF) 0.05 % cream, Apply 0.05 application  topically daily as needed (Lichen)., Disp: , Rfl:    benzonatate  (TESSALON ) 100 MG capsule, Take 100 mg by mouth daily as needed for cough., Disp: , Rfl:    buPROPion  (WELLBUTRIN  XL) 150 MG 24 hr tablet, Take 150 mg by mouth daily., Disp: , Rfl:    Cholecalciferol  (VITAMIN D) 50 MCG (2000 UT) CAPS, Take 2,000 Units by mouth daily., Disp: , Rfl:    clobetasol cream (TEMOVATE) 0.05 %, Apply 1 application  topically daily as needed (Scratches)., Disp: , Rfl:    clotrimazole-betamethasone (LOTRISONE) cream, Apply 1 application  topically daily as needed (Fugus)., Disp: , Rfl:    diclofenac  Sodium (VOLTAREN) 1 % GEL, Apply 2 g topically 4 (four) times daily as needed (Pain). Both knees, Disp: , Rfl:    dicyclomine  (BENTYL ) 20 MG tablet, Take 20 mg by mouth daily as needed for spasms., Disp: , Rfl:    feeding supplement (ENSURE PLUS HIGH PROTEIN) LIQD, Take 237 mLs by mouth 2 (two) times daily between meals., Disp: , Rfl:    fexofenadine (ALLEGRA) 180 MG tablet, Take 180 mg by mouth daily. , Disp: , Rfl:    fluticasone  (FLOVENT  HFA) 110 MCG/ACT inhaler, Use two puffs twice daily for one to two weeks during flares, Disp: 1 each, Rfl: 3   hydrochlorothiazide  (MICROZIDE ) 12.5 MG capsule, Take 1 capsule (12.5 mg total) by mouth daily., Disp: 90 capsule, Rfl: 3   lansoprazole  (PREVACID ) 30 MG capsule, Take 1 capsule (30 mg total) by mouth 2 (two) times daily before a meal., Disp: 30 capsule, Rfl: 5   loperamide  (IMODIUM ) 2 MG capsule, Take 1 capsule (2 mg total) by mouth as needed for diarrhea or loose stools (diarrhea)., Disp: , Rfl:    mometasone  (NASONEX ) 50 MCG/ACT nasal spray, Place 2 sprays into the nose daily., Disp: 34 each, Rfl:  2   montelukast  (SINGULAIR ) 10 MG tablet, Take 1 tablet (10 mg total) by mouth at bedtime., Disp: 90 tablet, Rfl: 2   olopatadine  (PATANOL) 0.1 % ophthalmic solution, Place 1 drop into both eyes 2 (two) times daily as needed., Disp: 5 mL, Rfl: 5   Polyethylene Glycol 3350  (PEG 3350 ) POWD, Take 17 g by mouth daily. , Disp: , Rfl:    potassium chloride  (KLOR-CON ) 10 MEQ tablet, Take 10-20 mEq by mouth See admin instructions. Take 10 mEq (1 tablet) by mouth every morning and 20 mEq (2 tablets) every night., Disp: , Rfl:    rosuvastatin  (CRESTOR ) 10 MG tablet, Take 10 mg by mouth daily., Disp: , Rfl:    tiZANidine (ZANAFLEX) 4 MG tablet, Take 2 mg by mouth at bedtime as needed (Sleep)., Disp: , Rfl:    triamcinolone cream (KENALOG) 0.1 %, Apply 1 application  topically 2 (two) times daily as needed (Eyes)., Disp: , Rfl:    verapamil  (CALAN -SR) 240 MG CR tablet, Take 1 tablet (240 mg total) by mouth at bedtime., Disp: 90 tablet, Rfl: 3   Influenza vac split trivalent PF (FLUZONE HIGH-DOSE) 0.5 ML injection, Inject 0.5 mLs into the muscle once for 1 dose., Disp: 0.5 mL, Rfl: 0   Physical Exam:   BP (!) 168/77   Pulse (!) 55   Temp (!) 97.4 F (36.3 C)   LMP  (LMP Unknown)   SpO2 95%   Pertinent Findings  CN II-XII grossly intact Bilateral EAC clear and TM intact with well pneumatized middle ear spaces Anterior rhinoscopy: Septum midline; bilateral inferior turbinates with no hypertrophy No lesions of oral cavity/oropharynx; dentition within normal limits No obviously palpable neck masses/lymphadenopathy/thyromegaly No respiratory distress or stridor       Seprately Identifiable Procedures:  None  Impression & Plans:  Laurie Bowman is a 83 y.o. female with the following   Assessment and Plan    Cerumen- No obvious cerumen on exam today, hearing is at baseline.  Follow-up in 1 year for repeat evaluation sooner as needed.      - f/u 1 year   Thank you for allowing me the  opportunity to care for your patient. Please do not hesitate to contact me should you have any other questions.  Sincerely, Chyrl Cohen PA-C [facility] Phone: [phone] Fax: [phone]  02/03/2024, 1:51 PM

## 2024-02-05 IMAGING — DX DG CHEST 1V PORT
1 series · 1 of 1 positions shown · non-contrast
Comparison: Radiographs 08/29/2021 and 02/12/2021.  CT 09/17/2020.

CLINICAL DATA: Dyspnea.

EXAM:
PORTABLE CHEST 1 VIEW

[chest]
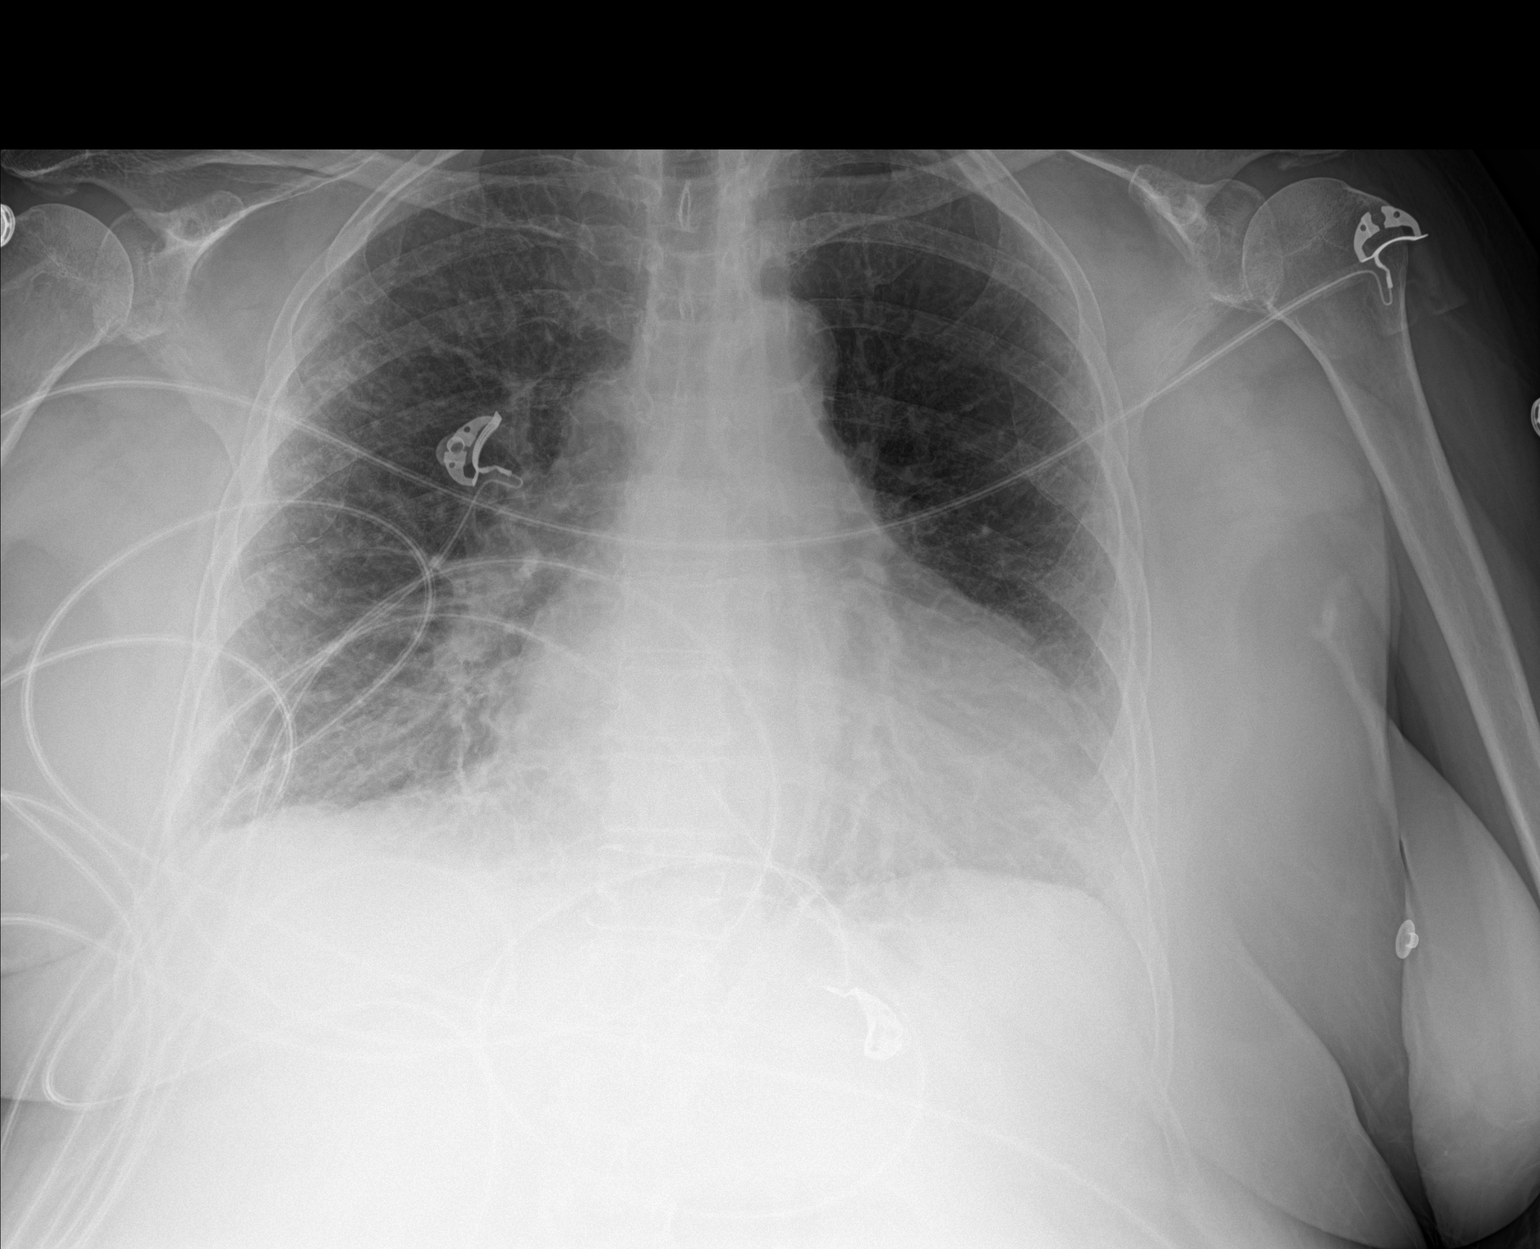

[1 of 1 positions shown; findings below may reference images not displayed]

FINDINGS: 4771 hours. Stable cardiomegaly and aortic atherosclerosis. Stable
mild chronic interstitial prominence without evidence of
superimposed edema, confluent airspace opacity, pneumothorax or
significant pleural effusion. The bones appear unchanged. No acute
osseous findings are evident.
IMPRESSION: Stable cardiomegaly and chronic interstitial lung disease. No acute
process identified.

## 2024-02-11 NOTE — Telephone Encounter (Signed)
 Requesting office sent duplicate with an update for procedure: colonoscopy and endoscopy   Looks like the pt had a tele preop appt 01/16/24 Miriam Shams, NP. After reviewing the notes looks like pt has been cleared and recommendations for blood thinner are noted in notes.   Will just have preop APP review to be sure nothing has been missed and the update on the procedure.

## 2024-03-01 ENCOUNTER — Other Ambulatory Visit: Payer: Self-pay | Admitting: Allergy & Immunology

## 2024-03-19 ENCOUNTER — Other Ambulatory Visit: Payer: Self-pay | Admitting: Internal Medicine

## 2024-03-19 DIAGNOSIS — I480 Paroxysmal atrial fibrillation: Secondary | ICD-10-CM

## 2024-03-29 ENCOUNTER — Ambulatory Visit: Attending: Physician Assistant | Admitting: Physician Assistant

## 2024-03-29 VITALS — BP 142/80 | HR 64 | Ht 60.0 in | Wt 136.0 lb

## 2024-03-29 DIAGNOSIS — Z79899 Other long term (current) drug therapy: Secondary | ICD-10-CM | POA: Diagnosis not present

## 2024-03-29 DIAGNOSIS — R42 Dizziness and giddiness: Secondary | ICD-10-CM

## 2024-03-29 DIAGNOSIS — I480 Paroxysmal atrial fibrillation: Secondary | ICD-10-CM | POA: Diagnosis not present

## 2024-03-29 NOTE — Patient Instructions (Addendum)
 Medication Instructions:   STOP  TAKING : AMIODARONE     *If you need a refill on your cardiac medications before your next appointment, please call your pharmacy*   Lab Work: .  PLEASE GO DOWN STAIRS  [facility]  FIRST FLOOR   ( GET OFF ELEVATORS WALK TOWARDS WAITING AREA LAB LOCATED BY PHARMACY):  BMET AND CBC TODAY       If you have labs (blood work) drawn today and your tests are completely normal, you will receive your results only by: MyChart Message (if you have MyChart) OR A paper copy in the mail If you have any lab test that is abnormal or we need to change your treatment, we will call you to review the results.   Testing/Procedures: NONE ORDERED  TODAY    Follow-Up: At [facility], you and your health needs are our priority.  As part of our continuing mission to provide you with exceptional heart care, our providers are all part of one team.  This team includes your primary Cardiologist (physician) and Advanced Practice Providers or APPs (Physician Assistants and Nurse Practitioners) who all work together to provide you with the care you need, when you need it.   Your next appointment:   NEW PATIENT  2 month(s)  Provider:    Donnice Primus, MD  ( CONTACT  CASSIE HALL/ ANGELINE HAMMER FOR EP SCHEDULING ISSUES )   We recommend signing up for the patient portal called MyChart.  Sign up information is provided on this After Visit Summary.  MyChart is used to connect with patients for Virtual Visits (Telemedicine).  Patients are able to view lab/test results, encounter notes, upcoming appointments, etc.  Non-urgent messages can be sent to your provider as well.   To learn more about what you can do with MyChart, go to [email or web address].   Other Instructions

## 2024-03-29 NOTE — Progress Notes (Signed)
 " Cardiology Office Note:  .   Date:  03/29/2024  ID:  Laurie Bowman, DOB February 01, 1941, MRN [id number] PCP: Laurie Norleen ORN., MD  [facility] Providers Cardiologist:  Dr. Monetta (she no longer sees him) EP: Laurie Birmingham, MD (inactive)  History of Present Illness: Laurie   Aviana Bowman is a 84 y.o. female w/PMHx of  HTN, HLD, bladder ca (nephrostomy tube) HCM AFib LBBB  June 2025 admitted w/progressive weakness, UTI, in-pt developed rapid Afib planned for amiodarone  though had quick return of SR and deferred, though seems ultimately did start PO amio  She saw cards APP 09/24/23, finishe rehab, doing well, planned to continue PT/OT at home, she expresed desire to stop amio, planned to have her f/u ~ 83mo to decide  She saw Dr. Birmingham 11/28/23, discussed dizziness that sounded like it pre-dated the start up of amio again No symptoms of her HCM or AFib Amio continued  Today's visit is scheduled as a work in concerned about possible medication side effects ROS:   She opens with that she doesn't have AFib and doesn't think she needs the amiodarone  reports that she feels awful, and suspects the amiodarone  Feels like her gait is unsteady and getting worse and started after she started the amiodarone  Her eye MD told her he could see evidence of the amiodarone  in her eyes, and since she already is getting injections/treatment for macular edema and has cataracts this is very worrisome Her BP is all over the place her PMD found it very high and deferred the verapamil  to us  No CP No SOB No near syncope or syncope unless she bends over and coming back up feels lightheaded  Arrhythmia/AAD hx AFib 2023 Amiodarone  started  started 2023, stopped 2/2 dizziness, nausea Amiodarone  restarted June 2025   Studies Reviewed: Laurie    EKG done today and reviewed by myself:  SR 64bpm, LBBB, LAD   Echocardiogram 08/18/2023:  1. LV outflow tract peak gradient at rest 138 mmHg. Left ventricular  ejection  fraction, by estimation, is 50 to 55%. The left ventricle has low  normal function. The left ventricle has no regional wall motion  abnormalities but there is septal-lateral  dyssynchrony consistent with LBBB. There is moderate asymmetric left  ventricular hypertrophy of the septal segment. Left ventricular diastolic  parameters are consistent with Grade I diastolic dysfunction (impaired  relaxation).   2. Right ventricular systolic function is normal. The right ventricular  size is normal. There is moderately elevated pulmonary artery systolic  pressure. The estimated right ventricular systolic pressure is 55.6 mmHg.   3. Left atrial size was moderately dilated.   4. Mitral valve systolic anterior motion is present. The mitral valve is  abnormal. Moderate eccentric mitral valve regurgitation. No evidence of  mitral stenosis.   5. The aortic valve is tricuspid. There is mild calcification of the  aortic valve. Aortic valve regurgitation is not visualized. No aortic  stenosis is present.   6. The inferior vena cava is normal in size with <50% respiratory  variability, suggesting right atrial pressure of 8 mmHg.   7. 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   Risk Assessment/Calculations:    Physical Exam:   VS:  LMP  (LMP Unknown)    Wt Readings from Last 3 Encounters:  01/01/24 136 lb (61.7 kg)  11/28/23 136 lb 9.6 oz (62 kg)  09/30/23 134 lb (60.8 kg)    GEN: Well nourished, well developed in no acute distress NECK: No JVD; No carotid  bruits CARDIAC: RRR, 2-3/6 SM, no rubs, gallops RESPIRATORY:  CTA b/l without rales, wheezing or rhonchi  ABDOMEN: Soft, non-tender, non-distended EXTREMITIES: No edema; No deformity    ASSESSMENT AND PLAN: .    paroxysmal AFib CHA2DS2Vasc is 5, on Eliquis .  Long discussion today on her AFib diagnosis and her Eliquis  dose. She weighs every morning without clothes, today 133, wobbles up towards 140, though mostly about like today She has CKD,  though does not appear to typically get 1.5 or higher She reports that she was told the 5mg  dose was way to high for her unclear by who, sounds like a phone conversation somewhere along the way before a colonoscopy.  Her AFib it appears has been triggered/seen during times of acute illness Will stop amiodarone , though if we run into AFib > will need to consider an alternative AAD Uncertain if she is an ablation candidate Dr. Waddell told her she would see Dr. Almetta for her new doctor Will have her see him ~ March-April time frame, amio will be out of her system Given her HOCM, rhythm/rate management is important  BMET today If Creat <1.5 will need to increase her eliquis  dose   HOCM + SM No symptoms  HTN Review of her record, generally I think her BP is fine, she has some higher readings and some lower. No changes for now  Secondary hypercoagulable state 2/2 AFib   Dispo: Dr. Almetta in ~ 48mo with amio washed out to discuss AAD/rhythm management options  Signed, Laurie Droge Macario Arthur, PA-C   "

## 2024-03-30 ENCOUNTER — Ambulatory Visit: Payer: Self-pay | Admitting: Physician Assistant

## 2024-03-30 DIAGNOSIS — Z79899 Other long term (current) drug therapy: Secondary | ICD-10-CM

## 2024-03-30 LAB — BASIC METABOLIC PANEL WITH GFR
BUN/Creatinine Ratio: 12 (ref 12–28)
BUN: 18 mg/dL (ref 8–27)
CO2: 23 mmol/L (ref 20–29)
Calcium: 10.3 mg/dL (ref 8.7–10.3)
Chloride: 100 mmol/L (ref 96–106)
Creatinine, Ser: 1.51 mg/dL — AB (ref 0.57–1.00)
Glucose: 77 mg/dL (ref 70–99)
Potassium: 4.9 mmol/L (ref 3.5–5.2)
Sodium: 139 mmol/L (ref 134–144)
eGFR: 34 mL/min/1.73 — AB

## 2024-03-30 LAB — CBC
Hematocrit: 41.9 % (ref 34.0–46.6)
Hemoglobin: 13.6 g/dL (ref 11.1–15.9)
MCH: 32 pg (ref 26.6–33.0)
MCHC: 32.5 g/dL (ref 31.5–35.7)
MCV: 99 fL — ABNORMAL HIGH (ref 79–97)
Platelets: 242 x10E3/uL (ref 150–450)
RBC: 4.25 x10E6/uL (ref 3.77–5.28)
RDW: 12.7 % (ref 11.7–15.4)
WBC: 7.4 x10E3/uL (ref 3.4–10.8)

## 2024-04-20 ENCOUNTER — Observation Stay (HOSPITAL_COMMUNITY)

## 2024-04-20 ENCOUNTER — Observation Stay (HOSPITAL_COMMUNITY)
Admission: EM | Admit: 2024-04-20 | Discharge: 2024-04-23 | Disposition: A | Discharge status: Skilled Nursing Facility | Source: Home / Self Care | Attending: Emergency Medicine | Admitting: Emergency Medicine

## 2024-04-20 ENCOUNTER — Emergency Department (HOSPITAL_COMMUNITY)

## 2024-04-20 ENCOUNTER — Other Ambulatory Visit: Payer: Self-pay

## 2024-04-20 DIAGNOSIS — S42032A Displaced fracture of lateral end of left clavicle, initial encounter for closed fracture: Secondary | ICD-10-CM | POA: Diagnosis present

## 2024-04-20 DIAGNOSIS — W19XXXA Unspecified fall, initial encounter: Secondary | ICD-10-CM | POA: Diagnosis present

## 2024-04-20 DIAGNOSIS — S42202A Unspecified fracture of upper end of left humerus, initial encounter for closed fracture: Secondary | ICD-10-CM | POA: Diagnosis not present

## 2024-04-20 DIAGNOSIS — I482 Chronic atrial fibrillation, unspecified: Secondary | ICD-10-CM | POA: Diagnosis not present

## 2024-04-20 DIAGNOSIS — R55 Syncope and collapse: Principal | ICD-10-CM | POA: Diagnosis present

## 2024-04-20 DIAGNOSIS — Z789 Other specified health status: Secondary | ICD-10-CM

## 2024-04-20 DIAGNOSIS — N1831 Chronic kidney disease, stage 3a: Secondary | ICD-10-CM | POA: Diagnosis present

## 2024-04-20 DIAGNOSIS — C801 Malignant (primary) neoplasm, unspecified: Secondary | ICD-10-CM

## 2024-04-20 LAB — CBC WITH DIFFERENTIAL/PLATELET
Abs Immature Granulocytes: 0.06 10*3/uL (ref 0.00–0.07)
Basophils Absolute: 0.1 10*3/uL (ref 0.0–0.1)
Basophils Relative: 1 %
Eosinophils Absolute: 0.1 10*3/uL (ref 0.0–0.5)
Eosinophils Relative: 1 %
HCT: 41.9 % (ref 36.0–46.0)
Hemoglobin: 14.1 g/dL (ref 12.0–15.0)
Immature Granulocytes: 1 %
Lymphocytes Relative: 9 %
Lymphs Abs: 1 10*3/uL (ref 0.7–4.0)
MCH: 32.4 pg (ref 26.0–34.0)
MCHC: 33.7 g/dL (ref 30.0–36.0)
MCV: 96.3 fL (ref 80.0–100.0)
Monocytes Absolute: 0.8 10*3/uL (ref 0.1–1.0)
Monocytes Relative: 8 %
Neutro Abs: 8.6 10*3/uL — ABNORMAL HIGH (ref 1.7–7.7)
Neutrophils Relative %: 80 %
Platelets: 198 10*3/uL (ref 150–400)
RBC: 4.35 MIL/uL (ref 3.87–5.11)
RDW: 12.9 % (ref 11.5–15.5)
WBC: 10.5 10*3/uL (ref 4.0–10.5)
nRBC: 0 % (ref 0.0–0.2)

## 2024-04-20 LAB — COMPREHENSIVE METABOLIC PANEL WITH GFR
ALT: 54 U/L — ABNORMAL HIGH (ref 0–44)
AST: 63 U/L — ABNORMAL HIGH (ref 15–41)
Albumin: 4.3 g/dL (ref 3.5–5.0)
Alkaline Phosphatase: 63 U/L (ref 38–126)
Anion gap: 12 (ref 5–15)
BUN: 18 mg/dL (ref 8–23)
CO2: 25 mmol/L (ref 22–32)
Calcium: 10.6 mg/dL — ABNORMAL HIGH (ref 8.9–10.3)
Chloride: 103 mmol/L (ref 98–111)
Creatinine, Ser: 1.48 mg/dL — ABNORMAL HIGH (ref 0.44–1.00)
GFR, Estimated: 35 mL/min — ABNORMAL LOW
Glucose, Bld: 89 mg/dL (ref 70–99)
Potassium: 4.5 mmol/L (ref 3.5–5.1)
Sodium: 140 mmol/L (ref 135–145)
Total Bilirubin: 0.9 mg/dL (ref 0.0–1.2)
Total Protein: 7.2 g/dL (ref 6.5–8.1)

## 2024-04-20 LAB — I-STAT CHEM 8, ED
BUN: 21 mg/dL (ref 8–23)
Calcium, Ion: 1.26 mmol/L (ref 1.15–1.40)
Chloride: 104 mmol/L (ref 98–111)
Creatinine, Ser: 1.5 mg/dL — ABNORMAL HIGH (ref 0.44–1.00)
Glucose, Bld: 95 mg/dL (ref 70–99)
HCT: 39 % (ref 36.0–46.0)
Hemoglobin: 13.3 g/dL (ref 12.0–15.0)
Potassium: 4.2 mmol/L (ref 3.5–5.1)
Sodium: 141 mmol/L (ref 135–145)
TCO2: 25 mmol/L (ref 22–32)

## 2024-04-20 LAB — LIPASE, BLOOD: Lipase: 25 U/L (ref 11–51)

## 2024-04-20 LAB — TROPONIN T, HIGH SENSITIVITY
Troponin T High Sensitivity: 23 ng/L — ABNORMAL HIGH (ref 0–19)
Troponin T High Sensitivity: 19 ng/L (ref 0–19)

## 2024-04-20 LAB — CK: Total CK: 101 U/L (ref 38–234)

## 2024-04-20 MED ORDER — APIXABAN 2.5 MG PO TABS
2.5000 mg | ORAL_TABLET | Freq: Two times a day (BID) | ORAL | Status: DC
  Administered 2024-04-20: 2.5 mg via ORAL
  Filled 2024-04-20: qty 1

## 2024-04-20 MED ORDER — BUPROPION HCL ER (XL) 150 MG PO TB24
150.0000 mg | ORAL_TABLET | Freq: Every day | ORAL | Status: DC
  Administered 2024-04-20 – 2024-04-23 (×4): 150 mg via ORAL
  Filled 2024-04-20 (×4): qty 1

## 2024-04-20 MED ORDER — POLYVINYL ALCOHOL 1.4 % OP SOLN: 1.0000 [drp] | OPHTHALMIC | Status: DC | PRN

## 2024-04-20 MED ORDER — ROSUVASTATIN CALCIUM 5 MG PO TABS
10.0000 mg | ORAL_TABLET | Freq: Every day | ORAL | Status: DC
  Administered 2024-04-20 – 2024-04-23 (×4): 10 mg via ORAL
  Filled 2024-04-20 (×4): qty 2

## 2024-04-20 MED ORDER — OXYCODONE HCL 5 MG PO TABS
5.0000 mg | ORAL_TABLET | Freq: Four times a day (QID) | ORAL | Status: DC | PRN
  Administered 2024-04-20 – 2024-04-23 (×7): 5 mg via ORAL
  Filled 2024-04-20 (×9): qty 1

## 2024-04-20 MED ORDER — DICLOFENAC SODIUM 1 % EX GEL: 2.0000 g | Freq: Four times a day (QID) | CUTANEOUS | Status: DC | PRN

## 2024-04-20 MED ORDER — LOPERAMIDE HCL 2 MG PO CAPS: 2.0000 mg | ORAL_CAPSULE | ORAL | Status: DC | PRN

## 2024-04-20 MED ORDER — OLOPATADINE HCL 0.1 % OP SOLN: 1.0000 [drp] | Freq: Two times a day (BID) | OPHTHALMIC | Status: DC | PRN

## 2024-04-20 MED ORDER — POLYETHYLENE GLYCOL 3350 17 G PO PACK
17.0000 g | PACK | Freq: Every day | ORAL | Status: DC
  Administered 2024-04-20 – 2024-04-23 (×4): 17 g via ORAL
  Filled 2024-04-20 (×4): qty 1

## 2024-04-20 MED ORDER — ACETAMINOPHEN 325 MG PO TABS
650.0000 mg | ORAL_TABLET | Freq: Four times a day (QID) | ORAL | Status: DC
  Administered 2024-04-20 – 2024-04-23 (×10): 650 mg via ORAL
  Filled 2024-04-20 (×12): qty 2

## 2024-04-20 MED ORDER — HYDROCHLOROTHIAZIDE 12.5 MG PO CAPS: 12.5000 mg | ORAL_CAPSULE | Freq: Every day | ORAL | Status: DC

## 2024-04-20 MED ORDER — FENTANYL CITRATE (PF) 50 MCG/ML IJ SOSY
50.0000 ug | PREFILLED_SYRINGE | Freq: Once | INTRAMUSCULAR | Status: AC
  Administered 2024-04-20: 50 ug via INTRAVENOUS
  Filled 2024-04-20: qty 1

## 2024-04-20 MED ORDER — VERAPAMIL HCL ER 240 MG PO TBCR
240.0000 mg | EXTENDED_RELEASE_TABLET | Freq: Every day | ORAL | Status: DC
  Administered 2024-04-20 – 2024-04-22 (×3): 240 mg via ORAL
  Filled 2024-04-20 (×4): qty 1

## 2024-04-20 MED ORDER — IOHEXOL 350 MG/ML SOLN
60.0000 mL | Freq: Once | INTRAVENOUS | Status: AC | PRN
  Administered 2024-04-20: 60 mL via INTRAVENOUS

## 2024-04-20 MED ORDER — ACETAMINOPHEN 10 MG/ML IV SOLN: 650.0000 mg | Freq: Four times a day (QID) | INTRAVENOUS | Status: DC | PRN

## 2024-04-20 MED ORDER — LORATADINE 10 MG PO TABS
10.0000 mg | ORAL_TABLET | Freq: Every day | ORAL | Status: DC
  Administered 2024-04-20 – 2024-04-23 (×4): 10 mg via ORAL
  Filled 2024-04-20 (×4): qty 1

## 2024-04-20 MED ORDER — APIXABAN 2.5 MG PO TABS
2.5000 mg | ORAL_TABLET | Freq: Two times a day (BID) | ORAL | Status: DC
  Administered 2024-04-21 – 2024-04-23 (×5): 2.5 mg via ORAL
  Filled 2024-04-20 (×5): qty 1

## 2024-04-20 NOTE — Assessment & Plan Note (Addendum)
"  Chronic, paroxysmal.  Patient is unable to tell when she is in A-fib. On eliquis  OP - will hold eliquis  for tonight pending ortho evaluation, consider restarting tomorrow if no surgery planned, if surgery planned, will start heparin  infusion (received eliquis  tonight) "

## 2024-04-20 NOTE — Progress Notes (Signed)
 [facility] Brief Note Suzann Daring, MD  Team Pager [phone] Pager [phone]  I have seen and examined this patient, and reviewed their chart. I have discussed this patient with the resident. I will sign the resident note as available.  Briefly, 83 yo with history of atrial fibrillation, bladder cancer s/p treatment, hypertrophic cardiomyopathy presenting with syncope at home resulting in L clavicular fracture. She reports she woke up with perspiration and sensation of needing to defecate. While on the toilet to defecate, perspiration continued and she had black spots in vision. She subsequently fell forward. She was able to call for her husband who came several hours later (he is downstairs, did not hear here). No chest pain, palpitations, or dyspnea at the time. She did have nausea with the episode.  She now reports L shoulder pain. She denies any ongoing symptoms. Reports she recently stopped amiodarone . No substance use. No new medications. Had a normal day yesterday and ate well yesterday. Lives with husband, was runner, broadcasting/film/video for 40 years. Has 2 kids. Does not use assistive device and can walk stairs.   PMH, PSH, Social history reviewed.  Imaging reviewed with patient including need for repeat chest CT in 12 months.  Left Shoulder Imaging  IMPRESSION: Moderately displaced and probably comminuted distal left clavicular fracture. Possible minimal separation of left acromioclavicular joint.   Labs notable for creatinine near baseline, mild ALT/AST elevation.  EKG shows sinus rhythm with persistent L bundle branch block   Syncope- echocardiogram, orthostatic vital signs, telemetry. Repeat CMP in AM. History most consistent with vasovagal episode.  L clavicular fracture- consulted Orthopedics  Will signs resident note as available

## 2024-04-20 NOTE — H&P (Addendum)
 "    Hospital Admission History and Physical Service Pager: [phone]  Patient name: Laurie Bowman Medical record number: [id number] Date of Birth: 1940-07-31 Age: 84 y.o. Gender: female  Primary Care Provider: Norleen Ferrier, MD  Consultants: Orthopedic surgery consulted by EDP  Code Status: DNR which was confirmed with family if patient unable to confirm    Preferred Emergency Contact:  Contact Information     Name Relation Home Work Laurie Bowman [phone]   Laurie Bowman   [phone]      Other Contacts     Name Relation Home Work Mobile   Laurie Bowman,Laurie Bowman  [phone]          Chief Complaint: Fall   Differential and Medical Decision Making:  Laurie Bowman is a 84 y.o. female with a PMH of A-fib, bladder cancer s/p treatment, hypertrophic cardiomyopathy presenting with syncope and fall at home resulting in L clavicular fracture.   Differential for this patient's presentation of this includes:  Vasovagal episode - most likely given patient was defecating at the time of syncopal episode, history of IBS 2. Arrhythmia - possible but less likely, history of A-fib, patient states she has only ever been in A-fib twice, unable to feel when she is in A-fib, will monitor patient on cardiac telemetry 3. Orthostasis - less likely, unknown if patient is orthostatic, will obtain orthostatic vital signs Assessment & Plan Fall Closed fracture of left proximal humerus Most likely due to vasovagal episode. - Admit to FM TS, telemetry, attending Dr. Delores - Vital signs per floor - Fall precautions - Pain: Tylenol  650 mg every 6 hours, oxycodone  5 mg every 6 hours as needed - Ortho consulted by EDP, appreciate recommendations - N.p.o. pending recommendation from Ortho about surgical intervention Syncope Believed to be secondary to vasovagal episode.  However, history of chronic A-fib. - Cardiac telemetry - echocardiogram  - orthostatics   Chronic atrial fibrillation (HCC) Chronic, paroxysmal.  Patient is unable to tell when she is in A-fib. On eliquis  OP - will hold eliquis  for tonight pending ortho evaluation, consider restarting tomorrow if no surgery planned, if surgery planned, will start heparin  infusion (received eliquis  tonight)  Chronic health problem Hypertension - continue home verapamil  240 mg nightly, holding hydrochlorothiazide  given softer Bps  Allergies - continue home loratadine  10 mg HLD - continue home Crestor  10 mg Anxiety/depression - continue home bupropion  150 mg Diarrhea - continue home Imodium  2 mg as needed Constipation - continue home MiraLAX  17 g packet daily Dry/itchy eyes - artificial tears as needed, olopatadine  0.1% as needed   FEN/GI: NPO  VTE Prophylaxis: On Eliquis    Disposition: Telemetry  History of Present Illness:  Laurie Bowman is a 84 y.o. female presenting with fall.   Patient reports she woke up last night sweaty and with the urge to defecate.  While on the toilet she began to see spots and then blacked out.  She states she hit her head on the bathroom scale and fell to the floor which is tile.  She states she was on the ground for a long time, but she does not know how long.  Eventually it became morning and her husband found her.  She denies chest pain, palpitations or dyspnea at the time.  She is now having left shoulder pain and her head aches.  She recently stopped amiodarone .  She denies any new medications.  She ate and drink well yesterday.  In the ED, patient was initially hypertensive to  162/100, other vital stable.  Troponin 23 then 19.  Creatinine 1.5.  CBC within normal limits.  Electrolytes within normal limits.  AST 63, ALT 54, lipase 25.  Left shoulder x-Laurie Bowman showed moderately displaced clavicular fracture.  Left forearm x-Laurie Bowman negative.  Noncon head CT showed no acute intracranial findings, left scalp hematoma, hypoattenuation which may represent subdural hygroma, versus  chronic subdural hematoma.  CT cervical spine showed no acute fracture, multilevel spondylolysis.  CT thoracic spine showed no acute fracture, minimal spondylolysis.  CT A&P showed 5 X 3 mm left lower lobe pulmonary nodule, chronic subpleural reticulation in both lungs, aortic atherosclerosis.  Chest x-Laurie Bowman showed no acute cardiopulmonary process, diffuse prominence of interstitial markings.  Pelvis x-Laurie Bowman showed no evidence of acute traumatic injury.  EKG showed normal sinus rhythm, LBBB.  Patient received 50 mcg of fentanyl  X2.  Review Of Systems: Per HPI   Pertinent Past Medical History: Syncope  Chronic A-fib GERD Asthma  Hypertrophic cardiomyopathy  LBBB HTN HLD Anxiety Depression  Bladder cancer   Remainder reviewed in history tab.   Pertinent Past Surgical History: Tonsillectomy Total knee replacement C-section  Remainder reviewed in history tab.  Pertinent Social History: Denies substance use. Lives with husband  Pertinent Family History: None pertinent.   Important Outpatient Medications: Hydrochlorothiazide  12.5 mg  Minoxidil 2.5 mg  Rosuvastatin  10  mg  Verapamil  240 mg  Bupropion  150 mg  Bentyl  20 mg  Lansoprazole  30 mg BID Loperamide  2 mg PRN for diarrhea Eliquis  2.5 mg BID Tizanidine 4 mg at bedtime PRN Potassium-chloride 10 mEq Albuterol  inhaler  Allegra 180 mg  Fluticasone  110 mcg inhaler  Mometasone  50 mcg nasal spray  Montelukast  10 mg at bedtime   Objective: BP 122/82 (BP Location: Right Arm)   Pulse 64   Temp 97.9 F (36.6 C) (Oral)   Resp 18   Ht 5' (1.524 m)   Wt 60.8 kg   LMP  (LMP Unknown)   SpO2 100%   BMI 26.17 kg/m  Exam: General: Awake, alert, NAD Eyes: EOMI grossly intact ENTM: Mucous membranes moist Cardiovascular: RRR, no M/R/G Respiratory: CTAB, normal work of breathing on room air Gastrointestinal: Soft, nondistended, nontender to palpation Derm: Bruises on right upper extremity, left temporal occipital  laceration Neuro: No focal neurologic deficits Psych: Mood and affect appropriate  Labs:  CBC BMET  Recent Labs  Lab 04/20/24 1015 04/20/24 1059  WBC 10.5  --   HGB 14.1 13.3  HCT 41.9 39.0  PLT 198  --    Recent Labs  Lab 04/20/24 1015 04/20/24 1059  NA 140 141  K 4.5 4.2  CL 103 104  CO2 25  --   BUN 18 21  CREATININE 1.48* 1.50*  GLUCOSE 89 95  CALCIUM  10.6*  --     Pertinent additional labs Troponin 23 > 19. Lipase 25.   EKG: My own interpretation: normal rate, LBBB, no ST elevation    Imaging Studies Performed:  Chest x-Laurie Bowman: 1. No acute cardiopulmonary process. 2. Diffuse prominence of interstitial markings.  Pelvis x-Laurie Bowman: 1. No evidence of acute traumatic injury.   CT head WO: 1. No acute intracranial findings. 2. Left frontoparietal scalp hematoma. 3. Focal extra-axial hypoattenuation over the right frontoparietal lobes, up to 8 mm in thickness, which may represent a subdural hygroma versus chronic subdural hematoma.  CT cervical spine WO: 1. No evidence of acute cervical spine fracture, traumatic subluxation or static signs of instability. 2. Multilevel cervical spondylosis as described.  CT T-spine: 1.  No evidence of acute thoracic spine fracture, traumatic subluxation or static signs of instability. 2. Minimal spondylosis. 3. Chest findings dictated separately.  CTAP: 1. No acute posttraumatic findings identified in the chest, abdomen or pelvis. 2. New 5 x 3 mm left upper lobe pulmonary nodule. Per Fleischner Society Guidelines, if patient is low risk for malignancy, no routine follow-up imaging is recommended. If patient is high risk for malignancy, a non-contrast Chest CT at 12 months is optional. If performed and the nodule is stable at 12 months, no further follow-up is recommended. These guidelines do not apply to immunocompromised patients and patients with cancer. Follow up in patients with significant comorbidities as clinically  warranted. For lung cancer screening, adhere to Lung-RADS guidelines. Reference: Radiology. 2017; 284(1):228-43. 3. 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 4. Interval removal of previously demonstrated right nephroureteral catheter. No evidence of urinary tract calculus or hydronephrosis. 5.  Aortic Atherosclerosis (ICD10-I70.0).  L Shoulder x-Laurie Bowman: Moderately displaced and probably comminuted distal left clavicular fracture. Possible minimal separation of left acromioclavicular joint.  L forearm x-Laurie Bowman: Negative.   Laurie Raguel MATSU, DO 04/20/2024, 3:53 PM PGY-1, [facility]  FPTS Intern pager: [phone], text pages welcome Secure chat group [facility] Teaching Service   Upper Level Attestation I have seen and examined the patient with the resident. I agree with the history, physical, and assessment above. Gloriann Ogren, MD PGY-2 [facility]     "

## 2024-04-20 NOTE — Progress Notes (Signed)
 Responded to page to support patient that [facility] hit head and injured shoulder.    Rayleen Dade, [place], [facility], Pager [phone]

## 2024-04-20 NOTE — Hospital Course (Addendum)
 Laurie Bowman is a 84 y.o.female with a history of A fib and bladder cancer s/p treatment, hypertrophic cardiomyoppathy who was admitted to the [facility] Medicine Teaching Service at [facility] for syncope and fall with L clavicular fracture. Her hospital course is detailed below:  Syncope Fall Patient presented to ED after syncopal episode while on the toilet trying to defecate; she fell and hit her head. She denied LOC. CT head without acute intracranial findings; did have a left frontoparietal scalp hematoma. No C spine injury or acute intracranial bleeding, no evidence of chronic bleeding but NSGY was contacted and they reassured that her fracture was not grounds for holding her DOAC. Orthostatic vital signs were positive. Echo was done; see below for further details. Patient with history of similar episodes, likely vasovagal.   L Clavicle Fracture Had L shoulder pain after fall and xray showed moderately displaced and probably comminuted distal left clavicular fracture. Orthopedics was consulted who recommended shoulder sling and close outpatient follow-up; did not recommend surgery inpatient. She was discharged to SNF for further rehab.  HOCM Patient with a history of HOCM per chart review; echo 08/2023 with LV outflow tract peak gradient of 138 mmHg and EF 50-55%. Echo done this hospitalization as part of syncope workup showed LVEF 45-50% with global hypokinesis of LV and LV outflow track gradient 60 mmHg; cards was consulted following. They recommending holding hydrochlorothiazide  and placing at 14 day monitor at discharge with patient to follow-up in outpatient HOCM clinic. No further inpatient workup pursued/recommended.  Hx of bladder cancer Elevated Liver Enzymes Initial AST/ALT elevated to 63/54 which downtrended prior to discharge. Patient with history of bladder cancer s/p treatment. CT A/P showed cystic-appearing subcapsular lesion superiorly adjacent to the left hepatic lobe which was seen on  previous imaging. There was also a new solid left upper lobe lung nodule.  Other chronic conditions were medically managed with home medications and formulary alternatives as necessary (A fib, CKD, HTN, HLD, anxiety/depression, diarrhea, constipation, dry/itchy eyes)  PCP Follow-up Recommendations: Repeat CMP Ensure follow-up with orthopedics (Dr. Sherida) 2/9  Ensure outpatient follow-up with HOCM clinic Consider restarting hydrochlorothiazide , currently held per cards and with low BP/orthostasis Consider repeat non-contrast Chest CT in 12 months for incidental LUL lung nodule

## 2024-04-20 NOTE — ED Notes (Signed)
PT to CT with Trauma RN

## 2024-04-20 NOTE — Progress Notes (Signed)
 Orthopedic Tech Progress Note Patient Details:  Amany Rando December 04, 1940 [id number] Applied sling immobilizer per order.  Ortho Devices Type of Ortho Device: Sling immobilizer Ortho Device/Splint Location: LUE Ortho Device/Splint Interventions: Ordered, Application, Adjustment   Post Interventions Patient Tolerated: Well Instructions Provided: Adjustment of device, Poper ambulation with device, Care of device  Morna Pink 04/20/2024, 11:08 PM

## 2024-04-20 NOTE — Assessment & Plan Note (Addendum)
"  Believed to be secondary to vasovagal episode.  However, history of chronic A-fib. - Cardiac telemetry - echocardiogram  - orthostatics "

## 2024-04-20 NOTE — Assessment & Plan Note (Addendum)
 Hypertension - continue home verapamil  240 mg nightly, holding hydrochlorothiazide  given softer Bps  Allergies - continue home loratadine  10 mg HLD - continue home Crestor  10 mg Anxiety/depression - continue home bupropion  150 mg Diarrhea - continue home Imodium  2 mg as needed Constipation - continue home MiraLAX  17 g packet daily Dry/itchy eyes - artificial tears as needed, olopatadine  0.1% as needed

## 2024-04-21 ENCOUNTER — Observation Stay (HOSPITAL_COMMUNITY)

## 2024-04-21 DIAGNOSIS — W19XXXA Unspecified fall, initial encounter: Secondary | ICD-10-CM

## 2024-04-21 DIAGNOSIS — R55 Syncope and collapse: Secondary | ICD-10-CM

## 2024-04-21 LAB — ECHOCARDIOGRAM COMPLETE
AR max vel: 0.87 cm2
AV Area VTI: 1.03 cm2
AV Area mean vel: 1.11 cm2
AV Mean grad: 34 mmHg
AV Peak grad: 68.6 mmHg
Ao pk vel: 4.14 m/s
Area-P 1/2: 2.71 cm2
Height: 60 in
S' Lateral: 2.8 cm
Weight: 2144 [oz_av]

## 2024-04-21 LAB — COMPREHENSIVE METABOLIC PANEL WITH GFR
ALT: 47 U/L — ABNORMAL HIGH (ref 0–44)
AST: 52 U/L — ABNORMAL HIGH (ref 15–41)
Albumin: 4.1 g/dL (ref 3.5–5.0)
Alkaline Phosphatase: 59 U/L (ref 38–126)
Anion gap: 12 (ref 5–15)
BUN: 22 mg/dL (ref 8–23)
CO2: 24 mmol/L (ref 22–32)
Calcium: 10 mg/dL (ref 8.9–10.3)
Chloride: 102 mmol/L (ref 98–111)
Creatinine, Ser: 1.22 mg/dL — ABNORMAL HIGH (ref 0.44–1.00)
GFR, Estimated: 44 mL/min — ABNORMAL LOW
Glucose, Bld: 83 mg/dL (ref 70–99)
Potassium: 4 mmol/L (ref 3.5–5.1)
Sodium: 138 mmol/L (ref 135–145)
Total Bilirubin: 1 mg/dL (ref 0.0–1.2)
Total Protein: 6.9 g/dL (ref 6.5–8.1)

## 2024-04-21 LAB — CBC
HCT: 42.5 % (ref 36.0–46.0)
Hemoglobin: 14.4 g/dL (ref 12.0–15.0)
MCH: 32.6 pg (ref 26.0–34.0)
MCHC: 33.9 g/dL (ref 30.0–36.0)
MCV: 96.2 fL (ref 80.0–100.0)
Platelets: 195 10*3/uL (ref 150–400)
RBC: 4.42 MIL/uL (ref 3.87–5.11)
RDW: 13.2 % (ref 11.5–15.5)
WBC: 7.8 10*3/uL (ref 4.0–10.5)
nRBC: 0 % (ref 0.0–0.2)

## 2024-04-21 MED ORDER — PERFLUTREN LIPID MICROSPHERE
1.0000 mL | INTRAVENOUS | Status: AC | PRN
  Administered 2024-04-21: 4 mL via INTRAVENOUS

## 2024-04-21 NOTE — Evaluation (Signed)
 Physical Therapy Evaluation Patient Details Name: Laurie Bowman MRN: [id number] DOB: 11-25-1940 Today's Date: 04/21/2024  History of Present Illness  Pt is a 84 y.o. female who presented to [facility] ED 04/20/24 after syncope with fall. She reports waking up in the middle of the night sweaty with the urge to defecate, began to see spots and then blacked out, hitting her head. Pt was on the ground for several hours until her husband found her in the morning.  Left shoulder x-ray showed moderately displaced clavicular fracture. CT showed no acute intracranial findings, left scalp hematoma. PMHx: osteoporosis, asthma, GERD, HTN, CKD 2, alopecia, HLD, IBS, depression, breast cancer, bladder cancer, hypertrophic cardiomyopathy, and A-fib.   Clinical Impression  Pt admitted with above diagnosis. PTA, pt was independent with functional mobility and modI with ADLs. She lives with her husband in a two story house with 3 STE. Pt currently with functional limitations due to the deficits listed below (see PT Problem List). She required CGA-minA for bed mobility and transfers. Pt is currently limited by pain, LUE sling, symptomatic orthostatic hypotension, generalized weakness, impaired balance, and decreased activity tolerance. PT is awaiting clarification on pt's LUE weight bearing status. Instructed pt to minimize use and treat as NWB for now. Pt will benefit from acute skilled PT to increase her independence and safety with mobility to allow discharge. Anticipate pt to progress well once pain and BP are controlled. Recommend HHPT to increase strength, improve balance, decrease fall risk, and optimize safety within the home environment.        04/21/24 1100  Vital Signs  Patient Position (if appropriate) Orthostatic Vitals  Orthostatic Lying   BP- Lying 114/66  Pulse- Lying 57  Orthostatic Sitting  BP- Sitting 98/77  Pulse- Sitting 58  Orthostatic Standing at 0 minutes  BP- Standing at 0 minutes (!) 80/58  Pulse-  Standing at 0 minutes 65      If plan is discharge home, recommend the following: A little help with walking and/or transfers;A little help with bathing/dressing/bathroom;Assistance with cooking/housework;Assist for transportation;Help with stairs or ramp for entrance   Can travel by private vehicle        Equipment Recommendations Wheelchair (measurements PT);Wheelchair cushion (measurements PT);BSC/3in1  Recommendations for Other Services       Functional Status Assessment Patient has had a recent decline in their functional status and demonstrates the ability to make significant improvements in function in a reasonable and predictable amount of time.     Precautions / Restrictions Precautions Precautions: Fall Recall of Precautions/Restrictions: Impaired Precaution/Restrictions Comments: Watch BP Required Braces or Orthoses: Sling Restrictions Weight Bearing Restrictions Per Provider Order: No      Mobility  Bed Mobility Overal bed mobility: Needs Assistance Bed Mobility: Supine to Sit     Supine to sit: Contact guard, Min assist, HOB elevated, Used rails     General bed mobility comments: Pt sat up on R side of bed with increased time. She brought BLE off EOB. Assist to elevate trunk. Pt scooted hips fwd. Instructed pt to limit WBing through LUE.    Transfers Overall transfer level: Needs assistance Equipment used: 1 person hand held assist Transfers: Sit to/from Stand, Bed to chair/wheelchair/BSC Sit to Stand: Contact guard assist   Step pivot transfers: Contact guard assist, Min assist       General transfer comment: Pt stood from lowest bed height. Pushed up with RUE supported by PT. Transferred to recliner chair with HHA. She took short slow steps with minimal  foot clearence. Postural sway and pt c/o nausea and black spots begining to appear. Fair eccentric control. Pt scooted back in chair.    Ambulation/Gait               General Gait Details:  Deferred d/t symptomatic orthostatic hypotension.  Stairs            Wheelchair Mobility     Tilt Bed    Modified Rankin (Stroke Patients Only)       Balance Overall balance assessment: Needs assistance, History of Falls Sitting-balance support: Single extremity supported, Feet supported Sitting balance-Leahy Scale: Fair Sitting balance - Comments: Sat EOB   Standing balance support: Single extremity supported, During functional activity Standing balance-Leahy Scale: Fair Standing balance comment: Pt dependent on 1 HHA and CGA-minA from PT.                             Pertinent Vitals/Pain Pain Assessment Pain Assessment: 0-10 Pain Score: 8  Pain Location: L clavicle Pain Descriptors / Indicators: Grimacing, Discomfort, Aching, Sore, Tender Pain Intervention(s): Monitored during session, Limited activity within patient's tolerance, Repositioned    Home Living Family/patient expects to be discharged to:: Private residence Living Arrangements: Spouse/significant other Available Help at Discharge: Family;Available 24 hours/day;Neighbor;Available PRN/intermittently (Husband 24/7; Neighbors prn, pt reports they're a good support system for them; Son lives in [place] and Daughter is out of state) Type of Home: House Home Access: Stairs to enter Entrance Stairs-Rails: Doctor, General Practice of Steps: 3 Alternate Level Stairs-Number of Steps: 1 flight Home Layout: Two level;Bed/bath upstairs;Full bath on main level;Able to live on main level with bedroom/bathroom Home Equipment: Shower seat;Grab bars - toilet;Rolling Walker (2 wheels);Cane - single point;Grab bars - tub/shower      Prior Function Prior Level of Function : History of Falls (last six months);Independent/Modified Independent             Mobility Comments: Indep no AD. Will utilize AD when she has an episode of weakness, but hasn't needed it lately. Denies fall hx besides the 1  leading to current admit. ADLs Comments: ModI with ADLs.     Extremity/Trunk Assessment   Upper Extremity Assessment Upper Extremity Assessment: Defer to OT evaluation    Lower Extremity Assessment Lower Extremity Assessment: Generalized weakness    Cervical / Trunk Assessment Cervical / Trunk Assessment: Normal  Communication   Communication Communication: No apparent difficulties    Cognition Arousal: Alert Behavior During Therapy: WFL for tasks assessed/performed   PT - Cognitive impairments: No apparent impairments                       PT - Cognition Comments: Pt A,Ox4 Following commands: Intact       Cueing Cueing Techniques: Verbal cues     General Comments General comments (skin integrity, edema, etc.): Orthostatic vitals taken, (+). Unable to maintain static stance for ~78mins. Pt c/o nausea and black spots appearing in her vision which happened the last time she passed out. Quickly had pt sit in recliner chair with feet elevated. BP in recliner chair: 145/72 (93).    Exercises     Assessment/Plan    PT Assessment Patient needs continued PT services  PT Problem List Decreased strength;Decreased activity tolerance;Decreased balance;Decreased mobility;Decreased knowledge of use of DME;Decreased safety awareness;Decreased knowledge of precautions;Pain       PT Treatment Interventions DME instruction;Gait training;Stair training;Functional mobility training;Therapeutic activities;Therapeutic exercise;Balance training;Patient/family education  PT Goals (Current goals can be found in the Care Plan section)  Acute Rehab PT Goals Patient Stated Goal: Return Home PT Goal Formulation: With patient Time For Goal Achievement: 05/05/24 Potential to Achieve Goals: Good    Frequency Min 2X/week     Co-evaluation               AM-PAC PT 6 Clicks Mobility  Outcome Measure Help needed turning from your back to your side while in a flat bed  without using bedrails?: A Little Help needed moving from lying on your back to sitting on the side of a flat bed without using bedrails?: A Little Help needed moving to and from a bed to a chair (including a wheelchair)?: A Little Help needed standing up from a chair using your arms (e.g., wheelchair or bedside chair)?: A Little Help needed to walk in hospital room?: A Lot Help needed climbing 3-5 steps with a railing? : A Lot 6 Click Score: 16    End of Session Equipment Utilized During Treatment: Gait belt Activity Tolerance: Treatment limited secondary to medical complications (Comment) (symptomatic OH) Patient left: in chair;with call bell/phone within reach;with chair alarm set Nurse Communication: Mobility status;Other (comment) (BP response, + OH) PT Visit Diagnosis: History of falling (Z91.81);Difficulty in walking, not elsewhere classified (R26.2);Muscle weakness (generalized) (M62.81);Unsteadiness on feet (R26.81)    Time: 1100-1130 PT Time Calculation (min) (ACUTE ONLY): 30 min   Charges:   PT Evaluation $PT Eval Moderate Complexity: 1 Mod PT Treatments $Therapeutic Activity: 8-22 mins PT General Charges $$ ACUTE PT VISIT: 1 Visit         Randall SAUNDERS, PT, DPT Acute Rehabilitation Services Office: [phone] Secure Chat Preferred  Delon CHRISTELLA Callander 04/21/2024, 12:53 PM

## 2024-04-21 NOTE — Assessment & Plan Note (Addendum)
 Moderately displaced and probably comminuted distal L clavicle fx. Labs reassuring. 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  -Orthopedic surgery consulted, recommending 1 week outpatient ortho f/u -Sling for comfort, no other inpatient interventions - Pain: Tylenol  650 mg every 6 hours, oxycodone  5 mg every 6 hours as needed

## 2024-04-21 NOTE — TOC CAGE-AID Note (Signed)
 Transition of Care [facility]) - CAGE-AID Screening   Patient Details  Name: Laurie Bowman MRN: [id number] Date of Birth: [birth year]  [facility] of [facility]) CM/SW Contact:    Pernella Ackerley E Ammon Muscatello, LCSW Phone Number: 04/21/2024, 9:02 AM   Clinical Narrative: Denied SA.   CAGE-AID Screening:    Have You Ever Felt You Ought to Cut Down on Your Drinking or Drug Use?: No Have People Annoyed You By Critizing Your Drinking Or Drug Use?: No Have You Felt Bad Or Guilty About Your Drinking Or Drug Use?: No Have You Ever Had a Drink or Used Drugs First Thing In The Morning to Steady Your Nerves or to Get Rid of a Hangover?: No CAGE-AID Score: 0  Substance Abuse Education Offered: No

## 2024-04-21 NOTE — Care Management Obs Status (Signed)
 MEDICARE OBSERVATION STATUS NOTIFICATION   Patient Details  Name: Laurie Bowman MRN: [id number] Date of Birth: [birth year]   Medicare Observation Status Notification Given:  Yes    [name] 04/21/2024, 3:43 PM

## 2024-04-21 NOTE — Assessment & Plan Note (Addendum)
 Tele w/o arrhythmia. Sinus bradycarda w/o symptoms.  - Echo pending  - orthostatics positive -Cardiology consulted given syncope iso HCM - Fall precautions

## 2024-04-21 NOTE — Plan of Care (Signed)
 Consult call w/ Kim from NSGY, discussed patient's CT head showing possible sign of chronic brain bleeding. NSGY reassured that it would be appropriate to restart DOAC, low concern for precipitating further bleeding.

## 2024-04-21 NOTE — Assessment & Plan Note (Addendum)
-   Continue DOAC per neurosurgery - Continue Verapamil  despite bradycardia

## 2024-04-21 NOTE — Assessment & Plan Note (Addendum)
 Hypertension - continue home verapamil  240 mg nightly, holding hydrochlorothiazide  given variable Bps  Allergies - continue home loratadine  10 mg HLD - continue home Crestor  10 mg Anxiety/depression - continue home bupropion  150 mg Diarrhea - continue home Imodium  2 mg as needed Constipation - continue home MiraLAX  17 g packet daily Dry/itchy eyes - artificial tears as needed, olopatadine  0.1% as needed

## 2024-04-21 NOTE — Consult Note (Signed)
 "  Cardiology Consultation   Patient ID: Laurie Bowman MRN: [id number]; DOB: 11/19/1940  Admit date: 04/20/2024 Date of Consult: 04/21/2024  PCP:  Lelon Norleen ORN., MD    HeartCare Providers Cardiologist:  None        Patient Profile: Laurie Bowman is a 84 y.o. female with a hx of hypertension, hyperlipidemia, bladder cancer s/p nephrostomy tube, hypertrophic cardiomyopathy, atrial fibrillation, LBBB, asthma, depression, and GERD who is being seen 04/21/2024 for the evaluation of syncope at the request of Laymon Legions MD.  History of Present Illness: Laurie Bowman has a history of atrial fibrillation in acute settings, most recent during an admission 08/2023 for pyelonephritis c/b AKI with significant electrolyte disturbances and rhabdomyolysis  She has been seen by EP for her AF, historically she was deferred for ablation given low AF burden and continued on verampil. As for her HOCM work-up does not appear she has ever had a cMRI for further evaluation. She did wear a heart monitor in 2023 that showed on brief and infrequent NSVT. She has not been seen by the HCM clinic.  Last seen by Charlies Arthur 03/29/24. Patient shared she wanted to stop amiodarone  as the patient thought it was causing her gait instability additionally was told by her eye doctor that there are signs of eye toxicity. Through shared decision making the amiodarone  was stopped, however it was stated if she returned to AF then would need to consider other AAD options. She is set to meet with Dr. Almetta for follow up and to discuss ablation if need given rhythm and rate management is important for her hypertrophic cardiomyopathy.   Presented to the ED yesterday after she fell at home, she had been on the toilet when she became diaphoretic and passed out. She did hit her head and shoulder. On EMS arrival she was hypertensive BP: 180/100 HR 60. BG 143 In the ED BP: 162/100 ECG on admission with significant background artifact  though appears to be sinus rhythm with LBBB VR 61 Extensive imaging conducted yesterday, pertinent:  Chest imaging showing no evidence of volume overload though progressive subpleural reticulation bilaterally with new pulmonary nodule. 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 She has received her home CCB and mulitple doses of pain medications thus far this admission.   + orthostatics today, though she shared since being in the hospital she has had limited po intake due to issues obtaining meals.   On interview, shared she had no chest pain or palpitations preceding the syncope episode. She had been having constipation prior to and was using miralax . She had her normal amount of po intake the day of about 5-6 bottles. She woke up in the middle of the night with the urge to have a bowel movement. She was on the toilet having said bowel movement when she had symptoms of lightheadedness and diaphoresis. She then loc. Only one other episode of syncope in her life.  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   Hypokalemia 09/27/2020   Infection due to Staphylococcus epidermidis 09/27/2020   Irritable bowel syndrome 12/16/2013   LBBB (left bundle branch block) 04/30/2018   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   Major depressive disorder, recurrent, unspecified 12/16/2013   Mitral regurgitation 04/30/2018   Moderate persistent asthma 09/28/2015   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 06/21/2015   Other chest pain 09/27/2020   Other specified disorders of eyelid 11/09/2015   Pain in unspecified knee 12/16/2013   Peripheral visual field defect of both eyes 04/21/2015   PVC's (premature ventricular contractions) 04/30/2018   Rotator cuff tendonitis 12/16/2013   Undiagnosed cardiac murmurs 12/16/2013   Overview:  Mild MR/TR, echo 2007 without change since 2001   Urticaria    Ventricular premature depolarization 12/16/2013   Wound dehiscence 05/08/2015    Past  Surgical History:  Procedure Laterality Date   ABDOMINAL ADHESION SURGERY     ADENOIDECTOMY     BREAST SURGERY     carpel tunnel     CESAREAN SECTION     CYSTOSCOPY W/ URETERAL STENT PLACEMENT Right 08/20/2023   Procedure: CYSTOSCOPY, WITH RETROGRADE PYELOGRAM AND URETERAL STENT INSERTION;  Surgeon: Cam Morene ORN, MD;  Location: [facility];  Service: Urology;  Laterality: Right;   IR NEPHROURETERAL CATH PLACE RIGHT  08/21/2023   IR URETERAL STENT PLACEMENT EXISTING ACCESS RIGHT  09/30/2023   KNEE SURGERY     LEG SURGERY     bone graft   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/2022   TONSILLECTOMY     VEIN SURGERY     Right Leg       Scheduled Meds:  acetaminophen   650 mg Oral Q6H   apixaban   2.5 mg Oral BID   buPROPion   150 mg Oral Daily   loratadine   10 mg Oral Daily   polyethylene glycol  17 g Oral Daily   rosuvastatin   10 mg Oral Daily   verapamil   240 mg Oral QHS   Continuous Infusions:  PRN Meds: artificial tears, diclofenac  Sodium, loperamide , olopatadine , oxyCODONE   Allergies:   Allergies[1]  Social History:   Social History   Socioeconomic History   Marital status: Married  Spouse name: Not on file   Number of children: Not on file   Years of education: Not on file   Highest education level: Not on file  Occupational History   Not on file  Tobacco Use   Smoking status: Former    Types: Cigarettes    Passive exposure: Past   Smokeless tobacco: Never  Vaping Use   Vaping status: Never Used  Substance and Sexual Activity   Alcohol  use: No    Alcohol /week: 0.0 standard drinks of alcohol    Drug use: No   Sexual activity: Never  Other Topics Concern   Not on file  Social History Narrative   Not on file   Social Drivers of Health   Tobacco Use: Medium Risk (02/15/2024)   Received from [facility]   Patient History    Smoking Tobacco Use: Former    Smokeless Tobacco Use: Never    Passive Exposure: Not on Actuary Strain: Not on file   Food Insecurity: No Food Insecurity (04/20/2024)   Epic    Worried About Programme Researcher, Broadcasting/film/video in the Last Year: Never true    Ran Out of Food in the Last Year: Never true  Transportation Needs: No Transportation Needs (04/20/2024)   Epic    Lack of Transportation (Medical): No    Lack of Transportation (Non-Medical): No  Physical Activity: Not on file  Stress: Not on file  Social Connections: Unknown (04/20/2024)   Social Connection and Isolation Panel    Frequency of Communication with Friends and Family: Patient declined    Frequency of Social Gatherings with Friends and Family: Patient declined    Attends Religious Services: Patient declined    Database Administrator or Organizations: Patient declined    Attends Banker Meetings: Patient declined    Marital Status: Married  Catering Manager Violence: Unknown (04/20/2024)   Epic    Fear of Current or Ex-Partner: No    Emotionally Abused: No    Physically Abused: Not on file    Sexually Abused: No  Depression (PHQ2-9): Not on file  Alcohol  Screen: Not on file  Housing: Low Risk (04/20/2024)   Epic    Unable to Pay for Housing in the Last Year: No    Number of Times Moved in the Last Year: 0    Homeless in the Last Year: No  Utilities: Not At Risk (04/20/2024)   Epic    Threatened with loss of utilities: No  Health Literacy: Not on file    Family History:   Family History  Problem Relation Age of Onset   Congestive Heart Failure Mother    Hypertension Mother    Hyperlipidemia Sister    Dementia Sister    Alzheimer's disease Sister    Stroke Maternal Grandmother    Breast cancer Sister    Hyperlipidemia Sister    Allergic rhinitis Neg Hx    Angioedema Neg Hx    Asthma Neg Hx    Eczema Neg Hx    Immunodeficiency Neg Hx    Urticaria Neg Hx    Atopy Neg Hx      ROS:  Please see the history of present illness.  All other ROS reviewed and negative.     Physical Exam/Data: Vitals:   04/20/24 2035 04/20/24 2341  04/21/24 0455 04/21/24 0736  BP: (!) 170/78 (!) 141/78 117/69 106/61  Pulse: 66 61 (!) 51 (!) 50  Resp: 19 19  18   Temp: 98.2 F (  36.8 C) 97.7 F (36.5 C)  (!) 97.4 F (36.3 C)  TempSrc:    Oral  SpO2: 100% 99% 99% 97%  Weight:      Height:       No intake or output data in the 24 hours ending 04/21/24 1551    04/20/2024   10:25 AM 03/29/2024    3:03 PM 01/01/2024   11:05 AM  Last 3 Weights  Weight (lbs) 134 lb 136 lb 136 lb  Weight (kg) 60.782 kg 61.689 kg 61.689 kg     Body mass index is 26.17 kg/m.  General:  Older woman in no acute distress HEENT: left hematoma with lac, no evidence of active bleeding Neck: no JVD Vascular: DP 2+ Cardiac:  normal S1, S2; RRR; 3/6 systolic murmur Lungs:  clear to auscultation bilaterally, no wheezing, rhonchi or rales   Ext: no edema Musculoskeletal:  notable bruising to posterior l shoulder Skin: cool to touch and dry  Psych:  Normal affect   EKG:  The EKG was personally reviewed and demonstrates:  See HPI Telemetry:  Telemetry was personally reviewed and demonstrates:  sinus bradycardia HR 55  Relevant CV Studies: Echocardiogram 08/2023 IMPRESSIONS     1. LV outflow tract peak gradient at rest 138 mmHg. Left ventricular  ejection fraction, by estimation, is 50 to 55%. The left ventricle has low  normal function. The left ventricle has no regional wall motion  abnormalities but there is septal-lateral  dyssynchrony consistent with LBBB. There is moderate asymmetric left  ventricular hypertrophy of the septal segment. Left ventricular diastolic  parameters are consistent with Grade I diastolic dysfunction (impaired  relaxation).   2. Right ventricular systolic function is normal. The right ventricular  size is normal. There is moderately elevated pulmonary artery systolic  pressure. The estimated right ventricular systolic pressure is 55.6 mmHg.   3. Left atrial size was moderately dilated.   4. Mitral valve systolic anterior  motion is present. The mitral valve is  abnormal. Moderate eccentric mitral valve regurgitation. No evidence of  mitral stenosis.   5. The aortic valve is tricuspid. There is mild calcification of the  aortic valve. Aortic valve regurgitation is not visualized. No aortic  stenosis is present.   6. The inferior vena cava is normal in size with <50% respiratory  variability, suggesting right atrial pressure of 8 mmHg.   7. 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   Laboratory Data: High Sensitivity Troponin:  Recent Labs  Lab 04/20/24 1015 04/20/24 1209  TRNPT 23* 19      Chemistry Recent Labs  Lab 04/20/24 1015 04/20/24 1059 04/21/24 0535  NA 140 141 138  K 4.5 4.2 4.0  CL 103 104 102  CO2 25  --  24  GLUCOSE 89 95 83  BUN 18 21 22   CREATININE 1.48* 1.50* 1.22*  CALCIUM  10.6*  --  10.0  GFRNONAA 35*  --  44*  ANIONGAP 12  --  12    Recent Labs  Lab 04/20/24 1015 04/21/24 0535  PROT 7.2 6.9  ALBUMIN 4.3 4.1  AST 63* 52*  ALT 54* 47*  ALKPHOS 63 59  BILITOT 0.9 1.0   Hematology Recent Labs  Lab 04/20/24 1015 04/20/24 1059 04/21/24 0535  WBC 10.5  --  7.8  RBC 4.35  --  4.42  HGB 14.1 13.3 14.4  HCT 41.9 39.0 42.5  MCV 96.3  --  96.2  MCH 32.4  --  32.6  MCHC 33.7  --  33.9  RDW 12.9  --  13.2  PLT 198  --  195    Radiology/Studies:  DG Humerus Left Result Date: 04/20/2024 EXAM: 1 VIEW XRAY OF THE LEFT HUMERUS 04/20/2024 09:05:00 PM COMPARISON: None available. CLINICAL HISTORY: ICD [phone] Hip fracture (HCC). FINDINGS: BONES AND JOINTS: Acute fracture of distal left clavicle. No malalignment. SOFT TISSUES: Unremarkable. IMPRESSION: 1. Acute fracture of the distal left clavicle. Electronically signed by: Greig Pique MD 04/20/2024 09:08 PM EST RP Workstation: [id number]   CT Head Wo Contrast Result Date: 04/20/2024 EXAM: CT HEAD WITH CONTRAST 04/20/2024 11:10:45 AM TECHNIQUE: CT of the head was performed with the administration of 60 mL of iohexol  (OMNIPAQUE ) 350  MG/ML injection. Automated exposure control, iterative reconstruction, and/or weight based adjustment of the mA/kV was utilized to reduce the radiation dose to as low as reasonably achievable. COMPARISON: None available. CLINICAL HISTORY: FOT FINDINGS: BRAIN AND VENTRICLES: Generalized parenchymal volume loss and prominence of the lateral ventricles. Moderate chronic microvascular ischemic changes. Focal extra-axial hypoattenuation over the right frontoparietal lobes, measuring up to 8 mm in thickness, which may reflect a subdural hygroma versus chronic subdural hematoma. Mild local mass effect. No midline shift. No acute hemorrhage. No evidence of acute infarct. No hydrocephalus. ORBITS: No acute abnormality. SINUSES: No acute abnormality. SOFT TISSUES AND SKULL: Acute left frontoparietal scalp hematoma. No skull fracture. IMPRESSION: 1. No acute intracranial findings. 2. Left frontoparietal scalp hematoma. 3. Focal extra-axial hypoattenuation over the right frontoparietal lobes, up to 8 mm in thickness, which may represent a subdural hygroma versus chronic subdural hematoma. Electronically signed by: Donnice Mania MD 04/20/2024 04:57 PM EST RP Workstation: [id number]   DG Forearm Left Result Date: 04/20/2024 CLINICAL DATA:  Fall EXAM: LEFT FOREARM - 2 VIEW COMPARISON:  None Available. FINDINGS: There is no evidence of fracture or other focal bone lesions. Soft tissues are unremarkable. IMPRESSION: Negative. Electronically Signed   By: Lynwood Landy Raddle M.D.   On: 04/20/2024 13:54   DG Shoulder Left Port Result Date: 04/20/2024 CLINICAL DATA:  Left clavicular pain after fall EXAM: LEFT SHOULDER COMPARISON:  None Available. FINDINGS: Moderately displaced and probably comminuted distal left clavicular fracture is noted. Possible minimal separation of left acromioclavicular joint is noted. Glenohumeral joint is unremarkable. IMPRESSION: Moderately displaced and probably comminuted distal left clavicular fracture.  Possible minimal separation of left acromioclavicular joint. Electronically Signed   By: Lynwood Landy Raddle M.D.   On: 04/20/2024 13:52   CT CHEST ABDOMEN PELVIS W CONTRAST Result Date: 04/20/2024 CLINICAL DATA:  Status post fall. Abdominal distension and diaphoresis. On blood thinners. EXAM: CT CHEST, ABDOMEN, AND PELVIS WITH CONTRAST TECHNIQUE: Multidetector CT imaging of the chest, abdomen and pelvis was performed following the standard protocol during bolus administration of intravenous contrast. RADIATION DOSE REDUCTION: This exam was performed according to the departmental dose-optimization program which includes automated exposure control, adjustment of the mA and/or kV according to patient size and/or use of iterative reconstruction technique. CONTRAST:  60mL OMNIPAQUE  IOHEXOL  350 MG/ML SOLN COMPARISON:  Chest CT 09/17/2020.  Abdominopelvic CT 08/25/2023. FINDINGS: CT CHEST FINDINGS Cardiovascular: No acute vascular findings are demonstrated. There is atherosclerosis of the aorta, great vessels and coronary arteries. No evidence of mediastinal hematoma. The heart size is normal. There is no pericardial effusion. Mediastinum/Nodes: There are no enlarged mediastinal, hilar or axillary lymph nodes. Small hiatal hernia. The thyroid gland and trachea appear unremarkable. Lungs/Pleura: No pleural effusion or pneumothorax. 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 There is a  new solid left upper lobe nodule measuring 5 x 3 mm on image 25/4. No confluent airspace disease or other suspicious pulmonary nodularity. Musculoskeletal/Chest wall: No chest wall mass or suspicious osseous findings. CT ABDOMEN AND PELVIS FINDINGS Hepatobiliary: The liver has a non cirrhotic morphology without suspicious focal abnormality. There is a cystic-appearing subcapsular lesion superiorly adjacent to the left hepatic lobe, measuring 2.1 x 1.5 cm on image 39/3. Although not well seen on most recent  noncontrast study, this is grossly unchanged. No evidence of gallstones, gallbladder wall thickening or biliary dilatation. Pancreas: Unremarkable. No pancreatic ductal dilatation or surrounding inflammatory changes. Spleen: Normal in size without focal abnormality. Adrenals/Urinary Tract: Both adrenal glands appear normal. No evidence of urinary tract calculus, suspicious renal lesion or hydronephrosis. Stable simple appearing left renal cyst for which no specific follow-up imaging is recommended. Interval removal of previously demonstrated right nephroureteral catheter. The bladder appears unremarkable for its degree of distention. Stomach/Bowel: No enteric contrast administered. The stomach appears unremarkable for its degree of distension. No evidence of bowel wall thickening, distention or surrounding inflammatory change. Suspected previous appendectomy, as before. Vascular/Lymphatic: There are no enlarged abdominal or pelvic lymph nodes. Aortic and branch vessel atherosclerosis without evidence of aneurysm or large vessel occlusion. Reproductive: The uterus and ovaries appear stable, without significant findings. Other: No evidence of abdominal wall mass or hernia. No ascites or pneumoperitoneum. Musculoskeletal: No acute or significant osseous findings. Multilevel lumbar facet arthropathy. IMPRESSION: 1. No acute posttraumatic findings identified in the chest, abdomen or pelvis. 2. New 5 x 3 mm left upper lobe pulmonary nodule. Per Fleischner Society Guidelines, if patient is low risk for malignancy, no routine follow-up imaging is recommended. If patient is high risk for malignancy, a non-contrast Chest CT at 12 months is optional. If performed and the nodule is stable at 12 months, no further follow-up is recommended. These guidelines do not apply to immunocompromised patients and patients with cancer. Follow up in patients with significant comorbidities as clinically warranted. For lung cancer screening,  adhere to Lung-RADS guidelines. Reference: Radiology. 2017; 284(1):228-43. 3. 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 4. Interval removal of previously demonstrated right nephroureteral catheter. No evidence of urinary tract calculus or hydronephrosis. 5.  Aortic Atherosclerosis (ICD10-I70.0). Electronically Signed   By: Elsie Perone M.D.   On: 04/20/2024 11:59   CT T-SPINE NO CHARGE Result Date: 04/20/2024 CLINICAL DATA:  Status post fall on blood thinners.  Back pain. EXAM: CT Thoracic Spine without contrast TECHNIQUE: Multiplanar CT images of the thoracic spine were reconstructed from contemporary CT of the Chest. RADIATION DOSE REDUCTION: This exam was performed according to the departmental dose-optimization program which includes automated exposure control, adjustment of the mA and/or kV according to patient size and/or use of iterative reconstruction technique. CONTRAST:  No additional COMPARISON:  Chest CT 09/17/2020 FINDINGS: Alignment: Normal. Vertebrae: No evidence of acute thoracic spine fracture or traumatic subluxation. No aggressive osseous lesions. Paraspinal and other soft tissues: No significant paraspinal findings. Chest findings dictated separately. Disc levels: Minimal multilevel spondylosis with endplate osteophytes. No evidence of large disc herniation, significant spinal stenosis or significant foraminal narrowing. IMPRESSION: 1. No evidence of acute thoracic spine fracture, traumatic subluxation or static signs of instability. 2. Minimal spondylosis. 3. Chest findings dictated separately. Electronically Signed   By: Elsie Perone M.D.   On: 04/20/2024 11:40   CT Cervical Spine Wo Contrast Result Date: 04/20/2024 CLINICAL DATA:  Fall at home.  On blood thinners. EXAM:  CT CERVICAL SPINE WITHOUT CONTRAST TECHNIQUE: Multidetector CT imaging of the cervical spine was performed without intravenous contrast. Multiplanar CT image  reconstructions were also generated. RADIATION DOSE REDUCTION: This exam was performed according to the departmental dose-optimization program which includes automated exposure control, adjustment of the mA and/or kV according to patient size and/or use of iterative reconstruction technique. COMPARISON:  None Available. FINDINGS: Alignment: Mild convex right scoliosis. No focal angulation or listhesis. Skull base and vertebrae: No evidence of acute cervical spine fracture or traumatic subluxation. Soft tissues and spinal canal: No prevertebral fluid or swelling. No visible canal hematoma. Disc levels: Multilevel spondylosis with disc space narrowing, endplate osteophytes and facet hypertrophy. No large disc herniation identified. Mild-to-moderate osseous foraminal narrowing at multiple levels, greatest on the left at C3-4. The left C2-3 facet joint appears ankylosed. Upper chest: Clear lung apices. Other: None. IMPRESSION: 1. No evidence of acute cervical spine fracture, traumatic subluxation or static signs of instability. 2. Multilevel cervical spondylosis as described. Electronically Signed   By: Elsie Perone M.D.   On: 04/20/2024 11:35   DG Pelvis Portable Result Date: 04/20/2024 EXAM: 1 OR 2 VIEW(S) XRAY OF THE PELVIS 04/20/2024 10:30:00 AM COMPARISON: KUB dated 08/27/2023. CLINICAL HISTORY: fall Fall. FINDINGS: BONES AND JOINTS: No acute fracture. No malalignment. SOFT TISSUES: Unremarkable. IMPRESSION: 1. No evidence of acute traumatic injury. Electronically signed by: Evalene Coho MD 04/20/2024 10:34 AM EST RP Workstation: [id number]   DG Chest Portable 1 View Result Date: 04/20/2024 EXAM: 1 VIEW(S) XRAY OF THE CHEST 04/20/2024 10:30:00 AM COMPARISON: 08/17/2023 CLINICAL HISTORY: Fall. FINDINGS: LUNGS AND PLEURA: Diffuse prominence of interstitial markings. No pleural effusion. No pneumothorax. HEART AND MEDIASTINUM: No acute abnormality of the cardiac and mediastinal silhouettes. BONES AND SOFT  TISSUES: No acute osseous abnormality. IMPRESSION: 1. No acute cardiopulmonary process. 2. Diffuse prominence of interstitial markings. Electronically signed by: Evalene Coho MD 04/20/2024 10:34 AM EST RP Workstation: [id number]     Assessment and Plan:  Syncope Reported prodromal symptoms of lightheadedness and diaphoresis while having a bowel movement. Denied chest pain or palpitations. Patient reported good hydration prior to episode.  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 ECG without acute ischemic findings Troponin minimally elevated and flat.  Orthostatic vitals positive today, though reports decreased po intake since arrival Will follow up on echo.  Based on symptomology during bowel movement, most likely vasovagal though may have had some component of HOCM. Do not suspect arrhythmia induced either, no evidence of ventricular arrhthymias or atrial fibrillation on telemetry this admission. May consider heart monitor outpatient with her HOCM dx. Do not suspect ACS.    HOCM LVOT gradient 138 mmHg on last echocardiogram  Follow up on echo results. She has not been seen by HOCM clinic, may would benefit to help optimize medications and for further testing if warranted.  I educated patient on the importance of hydration with her HOCM.  On exam, she is euvolemic. Would avoid dehydration or over diuresis.   Continue Verampil 240 mg  Atrial Fibrillation Currently in sinus rhythm Chad Vas score 5 Keep K > 4 and Mag > 2  Patient is set to follow up with Dr. Elige to discuss other options for AF control. Currently no evidence of AF on telemetry.  Continue verampil 240 mg  Continue eliqius 2.5 mg BID [reduced for age and weight] continue to trend hgb  Hypertension BP: 148/76 Home PTA hydrochlorothiazide  being held. Would advise against restarting with  her HOCM diagnosis.  Her BP has been labile this admission though  she has been having significant pain.  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 The patient's score is based upon: CHF History: 0 HTN History: 1 Diabetes History: 0 Stroke History: 0 Vascular Disease History: 0 Age Score: 2 Gender Score: 1        For questions or updates, please contact [facility] Please consult [email or web address] for contact info under      Signed, Leontine LOISE Salen, PA-C  04/21/2024 3:51 PM     [1]  Allergies Allergen Reactions   Cefaclor Itching    Other reaction(s): Flushing (ALLERGY/intolerance)   Codeine Itching and Nausea And Vomiting   Fructose Swelling and Nausea And Vomiting    Swelling and GI upset, Fructose Granules  Fructose Granules   Sulfa Antibiotics Swelling   Demerol Hcl [Meperidine] Itching and Nausea And Vomiting   Elemental Sulfur Swelling   Propoxyphene Nausea And Vomiting   "

## 2024-04-21 NOTE — Progress Notes (Signed)
 Echocardiogram 2D Echocardiogram has been performed.  Laurie Bowman 04/21/2024, 11:05 AM

## 2024-04-21 NOTE — Progress Notes (Signed)
 "    Daily Progress Note Intern Pager: [phone]  Patient name: Laurie Bowman Medical record number: [id number] Date of birth: November 28, 1940 Age: 84 y.o. Gender: female  Primary Care Provider: Lelon Norleen ORN., MD Consultants: Orthopedic surgery, neurosurgery, cardiology Code Status: DNR  Pt Overview and Major Events to Date:  2/3 - admitted  Medical Decision Making:  84 yo female w/ PMH of Afib, bladder cancer s/p treatment and hypertrophic cardiomyopathy admitted after syncopal episode w/ fall from toilet while on DOAC. Imaging showing no acute C spine injury or acute intracranial bleeding, significant for L clavicle fracture. Lab workup broadly reassuring. No arrhythmia on telemetry. Positive orthostatics. Echo pending. Orthopedic surgery recommended sling and close OP f/u. 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 Cardiology recs pending.  Assessment & Plan Fall Displaced fracture of lateral end of left clavicle, initial encounter for closed fracture Moderately displaced and probably comminuted distal L clavicle fx. Labs reassuring. 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  -Orthopedic surgery consulted, recommending 1 week outpatient ortho f/u -Sling for comfort, no other inpatient interventions - Pain: Tylenol  650 mg every 6 hours, oxycodone  5 mg every 6 hours as needed Syncope Tele w/o arrhythmia. Sinus bradycarda w/o symptoms.  - Echo pending  - orthostatics positive -Cardiology consulted given syncope iso HCM - Fall precautions Chronic atrial fibrillation (HCC) - Continue DOAC per neurosurgery - Continue Verapamil  despite bradycardia Chronic health problem Hypertension - continue home verapamil  240 mg nightly, holding hydrochlorothiazide  given variable Bps  Allergies - continue home loratadine  10 mg HLD - continue home Crestor  10 mg Anxiety/depression -  continue home bupropion  150 mg Diarrhea - continue home Imodium  2 mg as needed Constipation - continue home MiraLAX  17 g packet daily Dry/itchy eyes - artificial tears as needed, olopatadine  0.1% as needed   FEN/GI: regular PPx: home eliquis  Dispo:Home tomorrow. Barriers include echo, cardiology recs.   Subjective:  Discussed the events that transpired leading to her admission at some length. Discussed the plan for her evaluation and possible timeline for discharge. Patient reports getting some rest overnight. Her pain in her L shoulder is significant especially when she has to move it at all, but gets partial relief from pain medication. Patient reports feeling like she has some gassy pain in her abdomen, has hx of IBS.   Objective: Temp:  [97.4 F (36.3 C)-98.2 F (36.8 C)] 97.4 F (36.3 C) (02/04 0736) Pulse Rate:  [50-70] 50 (02/04 0736) Resp:  [15-19] 18 (02/04 0736) BP: (106-175)/(61-100) 106/61 (02/04 0736) SpO2:  [93 %-100 %] 97 % (02/04 0736) Weight:  [60.8 kg] 60.8 kg (02/03 1025) Physical Exam:  General: Awake, alert, NAD. Communicates clearly. HEENT: Anicteric sclera, conjunctiva clear. MMM, clear OP. L temporal laceration w/ clotting and hemostasis, visualization limited by surrounding hair. Mild TTP.  Cardio: RRR. Normal S1, S2. No murmur, rub, gallop. 2+ radial and dorsalis pedis pulses b/l w/ good capillary refill. Resp: CTA bilaterally.  Abdomen: Soft, non distended. TTP over LLQ. No guarding or rigidity.  Extremities: L arm immobilized in sling.  Bruising over R dorsal hand and forearm.  Neuro: AOx4. Cranial nerves II-XII intact.  Psych: Appropriate mood and affect.   Laboratory: Most recent CBC Lab Results  Component Value Date   WBC 7.8 04/21/2024   HGB 14.4 04/21/2024   HCT 42.5 04/21/2024   MCV 96.2 04/21/2024   PLT 195 04/21/2024   Most recent  BMP    Latest Ref Rng & Units 04/21/2024    5:35 AM  BMP  Glucose 70 - 99 mg/dL 83   BUN 8 - 23 mg/dL  22   Creatinine 9.55 - 1.00 mg/dL 8.77   Sodium 864 - 854 mmol/L 138   Potassium 3.5 - 5.1 mmol/L 4.0   Chloride 98 - 111 mmol/L 102   CO2 22 - 32 mmol/L 24   Calcium  8.9 - 10.3 mg/dL 89.9   AST 63, 52 ALT 54, 47 CK 101 Trop 23, 19 Lipase 25  Imaging/Diagnostic Tests:  Xray L shoulder, humerus, forearm: Moderately displaced and probably comminuted distal left clavicular fracture. Possible minimal separation of left acromioclavicular joint.  Xray pelvis: 1. No evidence of acute traumatic injury.   CT head, c spine, t spine:  1. No acute intracranial findings. 2. Left frontoparietal scalp hematoma. 3. Focal extra-axial hypoattenuation over the right frontoparietal lobes, up to 8 mm in thickness, which may represent a subdural hygroma versus chronic subdural hematoma.  1. No evidence of acute cervical spine fracture, traumatic subluxation or static signs of instability. 2. Multilevel cervical spondylosis as described.  1. No evidence of acute thoracic spine fracture, traumatic subluxation or static signs of instability. 2. Minimal spondylosis.     CXR: No acute CP process.   Manon Jester, DO 04/21/2024, 8:24 AM  PGY-1, [facility] Family Medicine FPTS Intern pager: [phone], text pages welcome Secure chat group [facility] Teaching Service   "

## 2024-04-21 NOTE — Discharge Instructions (Addendum)
 Dear Laurie Bowman,   Thank you for letting us  participate in your care! You were admitted to the hospital after your fall for testing and monitoring. You were found to have a broken collarbone, which did not require surgery and should heal on its own with time. We did additional testing to rule out concerning causes of your fainting episode, all of which were reassuring. This was likely caused by vasovagal syncope, which is a common cause of transient decrease of blood flow to the brain in response to strong emotional or physiologic stimuli which can cause fainting. Given your hypertrophic cardiomyopathy, it is very important for you to maintain good hydration to keep your heart well filled up with blood.   POST-HOSPITAL & CARE INSTRUCTIONS We recommend following up with your PCP Dr Lelon within 1 week from being discharged from the hospital. Please let PCP/Specialists know of any changes in medications that were made which you will be able to see in the medications section of this packet. Please also follow up with orthopedic surgery, Dr Sherida, @ 3:15pm on Monday 04/26/24 Please follow up with the HOCM clinic, they should reach out to you to schedule.  DOCTOR'S APPOINTMENTS & FOLLOW UP Future Appointments  Date Time Provider Department Center  06/04/2024 10:15 AM Almetta Donnice LABOR, MD [facility]  07/01/2024 10:30 AM Iva Marty Saltness, MD [facility] None    Thank you for choosing [facility]! Take care and be well!  Family Medicine Teaching Service Inpatient Team [facility]  [facility]  [address] [place], [place] [address] [phone]

## 2024-04-21 NOTE — Consult Note (Signed)
 "  ORTHOPAEDIC CONSULTATION  REQUESTING PHYSICIAN: McDiarmid, Krystal BIRCH, MD  Chief Complaint: left distal clavicle fracture  HPI: Laurie Bowman is a 84 y.o. female who sustained a fall and left distal clavicle fracture after a syncopal episode yesterday. Complaining of left shoulder pain. No pain in rest of extremity. No numbness/tingling.  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   Hypokalemia 09/27/2020   Infection due to Staphylococcus epidermidis 09/27/2020    Irritable bowel syndrome 12/16/2013   LBBB (left bundle branch block) 04/30/2018   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   Major depressive disorder, recurrent, unspecified 12/16/2013   Mitral regurgitation 04/30/2018   Moderate persistent asthma 09/28/2015   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 06/21/2015   Other chest pain 09/27/2020   Other specified disorders of eyelid 11/09/2015   Pain in unspecified knee 12/16/2013   Peripheral visual field defect of both eyes 04/21/2015   PVC's (premature ventricular contractions) 04/30/2018   Rotator cuff tendonitis 12/16/2013   Undiagnosed cardiac murmurs 12/16/2013   Overview:  Mild MR/TR, echo 2007 without change since 2001   Urticaria    Ventricular premature depolarization 12/16/2013   Wound dehiscence 05/08/2015   Past Surgical History:  Procedure Laterality Date   ABDOMINAL ADHESION SURGERY     ADENOIDECTOMY     BREAST SURGERY     carpel tunnel     CESAREAN SECTION     CYSTOSCOPY W/ URETERAL STENT PLACEMENT Right 08/20/2023   Procedure: CYSTOSCOPY, WITH RETROGRADE PYELOGRAM AND URETERAL STENT INSERTION;  Surgeon: Cam Morene ORN, MD;  Location: [facility];  Service: Urology;  Laterality: Right;   IR NEPHROURETERAL CATH PLACE RIGHT  08/21/2023   IR URETERAL STENT PLACEMENT EXISTING ACCESS RIGHT  09/30/2023   KNEE SURGERY     LEG SURGERY     bone graft   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/2022   TONSILLECTOMY     VEIN SURGERY     Right Leg   Social History   Socioeconomic History   Marital status: Married  Spouse name: Not on file   Number of children: Not on file   Years of education: Not on file   Highest education level: Not on file  Occupational History   Not on file  Tobacco Use   Smoking status: Former    Types: Cigarettes    Passive exposure: Past   Smokeless tobacco: Never  Vaping Use   Vaping status: Never Used   Substance and Sexual Activity   Alcohol  use: No    Alcohol /week: 0.0 standard drinks of alcohol    Drug use: No   Sexual activity: Never  Other Topics Concern   Not on file  Social History Narrative   Not on file   Social Drivers of Health   Tobacco Use: Medium Risk (02/15/2024)   Received from [facility]   Patient History    Smoking Tobacco Use: Former    Smokeless Tobacco Use: Never    Passive Exposure: Not on Actuary Strain: Not on file  Food Insecurity: No Food Insecurity (04/20/2024)   Epic    Worried About Programme Researcher, Broadcasting/film/video in the Last Year: Never true    Ran Out of Food in the Last Year: Never true  Transportation Needs: No Transportation Needs (04/20/2024)   Epic    Lack of Transportation (Medical): No    Lack of Transportation (Non-Medical): No  Physical Activity: Not on file  Stress: Not on file  Social Connections: Unknown (04/20/2024)   Social Connection and Isolation Panel    Frequency of Communication with Friends and Family: Patient declined    Frequency of Social Gatherings with Friends and Family: Patient declined    Attends Religious Services: Patient declined    Database Administrator or Organizations: Patient declined    Attends Banker Meetings: Patient declined    Marital Status: Married  Depression (PHQ2-9): Not on file  Alcohol  Screen: Not on file  Housing: Low Risk (04/20/2024)   Epic    Unable to Pay for Housing in the Last Year: No    Number of Times Moved in the Last Year: 0    Homeless in the Last Year: No  Utilities: Not At Risk (04/20/2024)   Epic    Threatened with loss of utilities: No  Health Literacy: Not on file   Family History  Problem Relation Age of Onset   Congestive Heart Failure Mother    Hypertension Mother    Hyperlipidemia Sister    Dementia Sister    Alzheimer's disease Sister    Stroke Maternal Grandmother    Breast cancer Sister    Hyperlipidemia Sister    Allergic rhinitis Neg Hx     Angioedema Neg Hx    Asthma Neg Hx    Eczema Neg Hx    Immunodeficiency Neg Hx    Urticaria Neg Hx    Atopy Neg Hx    Allergies[1] Prior to Admission medications  Medication Sig Start Date End Date Taking? Authorizing Provider  albuterol  (VENTOLIN  HFA) 108 (90 Base) MCG/ACT inhaler USE 1-2 PUFFS EVERY 4-6 HOURS AS NEEDED FOR COUGH OR WHEEZE. 12/01/23  Yes Iva Marty Saltness, MD  apixaban  (ELIQUIS ) 2.5 MG TABS tablet Take 1 tablet (2.5 mg total) by mouth 2 (two) times daily. 01/14/24  Yes Waddell Danelle ORN, MD  Artificial Tears ophthalmic solution Place 1-2 drops into both eyes as needed (dryness).   Yes [provider]  augmented betamethasone dipropionate (DIPROLENE-AF) 0.05 % cream Apply 0.05 application  topically daily as  needed (Lichen).   Yes [provider]  buPROPion  (WELLBUTRIN  XL) 150 MG 24 hr tablet Take 150 mg by mouth daily. 12/02/21  Yes [provider]  Cholecalciferol  (VITAMIN D) 50 MCG (2000 UT) CAPS Take 2,000 Units by mouth daily.   Yes [provider]  clobetasol cream (TEMOVATE) 0.05 % Apply 1 application  topically daily as needed (Scratches). 03/22/18  Yes [provider]  clotrimazole-betamethasone (LOTRISONE) cream Apply 1 application  topically daily as needed (Fungus). 03/16/19  Yes [provider]  diclofenac  Sodium (VOLTAREN ) 1 % GEL Apply 2 g topically 4 (four) times daily as needed (Pain). Both knees 04/16/19  Yes [provider]  dicyclomine  (BENTYL ) 20 MG tablet Take 20 mg by mouth daily as needed for spasms. 04/14/18  Yes [provider]  fexofenadine (ALLEGRA) 180 MG tablet Take 180 mg by mouth daily.    Yes [provider]  fluticasone  (FLOVENT  HFA) 110 MCG/ACT inhaler Use two puffs twice daily for one to two weeks during flares 06/26/23  Yes Iva Marty Saltness, MD  hydrochlorothiazide  (MICROZIDE ) 12.5 MG capsule Take 1 capsule (12.5 mg total) by mouth daily. 09/25/23  Yes Dunlap,  Lorette GRADE, PA-C  lansoprazole  (PREVACID ) 30 MG capsule Take 1 capsule (30 mg total) by mouth 2 (two) times daily before a meal. 07/10/21  Yes Iva Marty Saltness, MD  loperamide  (IMODIUM ) 2 MG capsule Take 1 capsule (2 mg total) by mouth as needed for diarrhea or loose stools (diarrhea). 08/27/23  Yes Christobal Guadalajara, MD  minoxidil (LONITEN) 2.5 MG tablet Take 1.25 mg by mouth daily. 04/17/24  Yes [provider]  mometasone  (NASONEX ) 50 MCG/ACT nasal spray Place 2 sprays into the nose daily. 06/26/23  Yes Iva Marty Saltness, MD  montelukast  (SINGULAIR ) 10 MG tablet Take 1 tablet (10 mg total) by mouth at bedtime. 01/01/24  Yes Iva Marty Saltness, MD  olopatadine  (PATANOL) 0.1 % ophthalmic solution Place 1 drop into both eyes 2 (two) times daily as needed. 04/18/20  Yes Iva Marty Saltness, MD  Polyethylene Glycol 3350  (PEG 3350 ) POWD Take 17 g by mouth daily.  06/24/16  Yes [provider]  potassium chloride  (KLOR-CON ) 10 MEQ tablet Take 10-20 mEq by mouth See admin instructions. Take 10 mEq (1 tablet) by mouth every morning and 20 mEq (2 tablets) every night. 04/21/15  Yes [provider]  rosuvastatin  (CRESTOR ) 10 MG tablet Take 10 mg by mouth daily. 07/13/21  Yes [provider]  tiZANidine (ZANAFLEX) 4 MG tablet Take 1 mg by mouth at bedtime as needed (Sleep). 11/16/19  Yes [provider]  triamcinolone cream (KENALOG) 0.1 % Apply 1 application  topically 2 (two) times daily as needed (Eyes). 12/14/18  Yes [provider]  verapamil  (CALAN -SR) 240 MG CR tablet Take 1 tablet (240 mg total) by mouth at bedtime. 12/08/23  Yes Waddell Danelle ORN, MD  acetaminophen  (TYLENOL ) 500 MG tablet Take 1,000 mg by mouth every 4 (four) hours as needed for moderate pain (pain score 4-6) or mild pain (pain score 1-3). 09/18/20   [provider]  albuterol  (PROVENTIL ) (2.5 MG/3ML) 0.083% nebulizer solution INHALE 3 ML BY NEBULIZATION EVERY 6 HOURS AS NEEDED FOR  WHEEZING OR SHORTNESS OF BREATH 03/01/24   Iva Marty Saltness, MD  amoxicillin  (AMOXIL ) 500 MG capsule Take 2,000 mg by mouth once. Patient not taking: Reported on 04/20/2024 03/30/24   [provider]    Family History Reviewed and non-contributory, no pertinent history of problems with  bleeding or anesthesia      Review of Systems 14 system ROS conducted and negative except for that noted in HPI   OBJECTIVE  Vitals:Patient Vitals for the past 8 hrs:  BP Temp Temp src Pulse Resp SpO2  04/21/24 1953 129/73 98 F (36.7 C) -- 62 18 95 %  04/21/24 1620 (!) 148/76 97.6 F (36.4 C) Oral (!) 57 18 98 %   General: Alert, no acute distress Cardiovascular: Warm extremities noted Respiratory: No cyanosis, no use of accessory musculature GI: No organomegaly, abdomen is soft and non-tender Skin: No lesions in the area of chief complaint other than those listed below in MSK exam.  Neurologic: Sensation intact distally save for the below mentioned MSK exam Psychiatric: Patient is competent for consent with normal mood and affect Lymphatic: No swelling obvious and reported other than the area involved in the exam below Extremities  LUE: Skin intact, no open wounds Swelling and ecchymosis to left shoulder Tender to palpation at the distal clavicle Pain and crepitus at distal clavicle with shoulder ROM No pain in arm, elbow, forearm or hand Neurovascularly intact  Test Results Imaging ECHOCARDIOGRAM COMPLETE Result Date: 04/21/2024    ECHOCARDIOGRAM REPORT   Patient Name:   Laurie Bowman Date of Exam: 04/21/2024 Medical Rec #:  [id number]    Height:       60.0 in Accession #:    [id number]   Weight:       134.0 lb Date of Birth:  [birth year]    BSA:          1.574 m Patient Age:    84 years     BP:           117/69 mmHg Patient Gender: F            HR:           55 bpm. Exam Location:  Inpatient Procedure: 2D Echo, Cardiac Doppler, Color Doppler and Intracardiac            Opacification  Agent (Both Spectral and Color Flow Doppler were            utilized during procedure). Indications:    Syncope  History:        Patient has prior history of Echocardiogram examinations, most                 recent 08/18/2023. Arrythmias:Atrial Fibrillation, LBBB and PVC,                 Signs/Symptoms:Syncope, Murmur, Dizziness/Lightheadedness and                 Chest Pain; Risk Factors:Hypertension, Dyslipidemia and Former                 Smoker.  Sonographer:    Juliene Rucks Sonographer#2:  Koleen Popper Referring Phys: [facility] IMPRESSIONS  1. Resting LV outflow track gradient 60 mmHg. Left ventricular ejection fraction, by estimation, is 45 to 50%. The left ventricle has mildly decreased function. The left ventricle demonstrates global hypokinesis. Left ventricular diastolic parameters are consistent with Grade I diastolic dysfunction (impaired relaxation).  2. Right ventricular systolic function is normal. The right ventricular size is normal.  3. The mitral valve is normal in structure. No evidence of mitral valve regurgitation. No evidence of mitral stenosis.  4. The aortic valve is calcified. Aortic valve regurgitation is not visualized. Aortic valve sclerosis/calcification is present, without any evidence of aortic stenosis.  5. The inferior vena  cava is normal in size with greater than 50% respiratory variability, suggesting right atrial pressure of 3 mmHg. FINDINGS  Left Ventricle: Resting LV outflow track gradient 60 mmHg. Left ventricular ejection fraction, by estimation, is 45 to 50%. The left ventricle has mildly decreased function. The left ventricle demonstrates global hypokinesis. Definity  contrast agent was  given IV to delineate the left ventricular endocardial borders. The left ventricular internal cavity size was normal in size. There is no left ventricular hypertrophy. Left ventricular diastolic parameters are consistent with Grade I diastolic dysfunction (impaired relaxation). Right  Ventricle: The right ventricular size is normal. No increase in right ventricular wall thickness. Right ventricular systolic function is normal. Left [facility]: Left atrial size was normal in size. Right [facility]: Right atrial size was normal in size. Pericardium: There is no evidence of pericardial effusion. Mitral Valve: The mitral valve is normal in structure. No evidence of mitral valve regurgitation. No evidence of mitral valve stenosis. Tricuspid Valve: The tricuspid valve is normal in structure. Tricuspid valve regurgitation is not demonstrated. No evidence of tricuspid stenosis. Aortic Valve: The aortic valve is calcified. Aortic valve regurgitation is not visualized. Aortic valve sclerosis/calcification is present, without any evidence of aortic stenosis. Aortic valve mean gradient measures 34.0 mmHg. Aortic valve peak gradient  measures 68.6 mmHg. Aortic valve area, by VTI measures 1.03 cm. Pulmonic Valve: The pulmonic valve was not well visualized. Pulmonic valve regurgitation is not visualized. No evidence of pulmonic stenosis. Aorta: The aortic root and ascending aorta are structurally normal, with no evidence of dilitation. Venous: The inferior vena cava is normal in size with greater than 50% respiratory variability, suggesting right atrial pressure of 3 mmHg. IAS/Shunts: No atrial level shunt detected by color flow Doppler.  LEFT VENTRICLE PLAX 2D LVIDd:         3.70 cm   Diastology LVIDs:         2.80 cm   LV e' medial:    4.03 cm/s LV PW:         1.40 cm   LV E/e' medial:  12.3 LV IVS:        1.40 cm   LV e' lateral:   5.61 cm/s LVOT diam:     1.60 cm   LV E/e' lateral: 8.9 LV SV:         85 LV SV Index:   54 LVOT Area:     2.01 cm  RIGHT VENTRICLE             IVC RV S prime:     16.80 cm/s  IVC diam: 2.00 cm LEFT ATRIUM             Index LA diam:        3.60 cm 2.29 cm/m LA Vol (A2C):   56.5 ml 35.89 ml/m LA Vol (A4C):   55.0 ml 34.93 ml/m LA Biplane Vol: 60.1 ml 38.17 ml/m  AORTIC VALVE AV Area  (Vmax):    0.87 cm AV Area (Vmean):   1.11 cm AV Area (VTI):     1.03 cm AV Vmax:           414.00 cm/s AV Vmean:          261.000 cm/s AV VTI:            0.828 m AV Peak Grad:      68.6 mmHg AV Mean Grad:      34.0 mmHg LVOT Vmax:         180.00  cm/s LVOT Vmean:        144.000 cm/s LVOT VTI:          0.425 m LVOT/AV VTI ratio: 0.51  AORTA Ao Root diam: 2.60 cm Ao Asc diam:  2.50 cm MITRAL VALVE MV Area (PHT): 2.71 cm     SHUNTS MV Decel Time: 280 msec     Systemic VTI:  0.42 m MV E velocity: 49.70 cm/s   Systemic Diam: 1.60 cm MV A velocity: 113.00 cm/s MV E/A ratio:  0.44 Kardie Tobb DO Electronically signed by Dub Huntsman DO Signature Date/Time: 04/21/2024/5:04:41 PM    Final    DG Humerus Left Result Date: 04/20/2024 EXAM: 1 VIEW XRAY OF THE LEFT HUMERUS 04/20/2024 09:05:00 PM COMPARISON: None available. CLINICAL HISTORY: ICD [phone] Hip fracture (HCC). FINDINGS: BONES AND JOINTS: Acute fracture of distal left clavicle. No malalignment. SOFT TISSUES: Unremarkable. IMPRESSION: 1. Acute fracture of the distal left clavicle. Electronically signed by: Greig Pique MD 04/20/2024 09:08 PM EST RP Workstation: [id number]   CT Head Wo Contrast Result Date: 04/20/2024 EXAM: CT HEAD WITH CONTRAST 04/20/2024 11:10:45 AM TECHNIQUE: CT of the head was performed with the administration of 60 mL of iohexol  (OMNIPAQUE ) 350 MG/ML injection. Automated exposure control, iterative reconstruction, and/or weight based adjustment of the mA/kV was utilized to reduce the radiation dose to as low as reasonably achievable. COMPARISON: None available. CLINICAL HISTORY: FOT FINDINGS: BRAIN AND VENTRICLES: Generalized parenchymal volume loss and prominence of the lateral ventricles. Moderate chronic microvascular ischemic changes. Focal extra-axial hypoattenuation over the right frontoparietal lobes, measuring up to 8 mm in thickness, which may reflect a subdural hygroma versus chronic subdural hematoma. Mild local mass effect. No midline  shift. No acute hemorrhage. No evidence of acute infarct. No hydrocephalus. ORBITS: No acute abnormality. SINUSES: No acute abnormality. SOFT TISSUES AND SKULL: Acute left frontoparietal scalp hematoma. No skull fracture. IMPRESSION: 1. No acute intracranial findings. 2. Left frontoparietal scalp hematoma. 3. Focal extra-axial hypoattenuation over the right frontoparietal lobes, up to 8 mm in thickness, which may represent a subdural hygroma versus chronic subdural hematoma. Electronically signed by: Donnice Mania MD 04/20/2024 04:57 PM EST RP Workstation: [id number]   DG Forearm Left Result Date: 04/20/2024 CLINICAL DATA:  Fall EXAM: LEFT FOREARM - 2 VIEW COMPARISON:  None Available. FINDINGS: There is no evidence of fracture or other focal bone lesions. Soft tissues are unremarkable. IMPRESSION: Negative. Electronically Signed   By: Lynwood Landy Raddle M.D.   On: 04/20/2024 13:54   DG Shoulder Left Port Result Date: 04/20/2024 CLINICAL DATA:  Left clavicular pain after fall EXAM: LEFT SHOULDER COMPARISON:  None Available. FINDINGS: Moderately displaced and probably comminuted distal left clavicular fracture is noted. Possible minimal separation of left acromioclavicular joint is noted. Glenohumeral joint is unremarkable. IMPRESSION: Moderately displaced and probably comminuted distal left clavicular fracture. Possible minimal separation of left acromioclavicular joint. Electronically Signed   By: Lynwood Landy Raddle M.D.   On: 04/20/2024 13:52   CT CHEST ABDOMEN PELVIS W CONTRAST Result Date: 04/20/2024 CLINICAL DATA:  Status post fall. Abdominal distension and diaphoresis. On blood thinners. EXAM: CT CHEST, ABDOMEN, AND PELVIS WITH CONTRAST TECHNIQUE: Multidetector CT imaging of the chest, abdomen and pelvis was performed following the standard protocol during bolus administration of intravenous contrast. RADIATION DOSE REDUCTION: This exam was performed according to the departmental dose-optimization program which  includes automated exposure control, adjustment of the mA and/or kV according to patient size and/or use of iterative reconstruction technique. CONTRAST:  60mL OMNIPAQUE   IOHEXOL  350 MG/ML SOLN COMPARISON:  Chest CT 09/17/2020.  Abdominopelvic CT 08/25/2023. FINDINGS: CT CHEST FINDINGS Cardiovascular: No acute vascular findings are demonstrated. There is atherosclerosis of the aorta, great vessels and coronary arteries. No evidence of mediastinal hematoma. The heart size is normal. There is no pericardial effusion. Mediastinum/Nodes: There are no enlarged mediastinal, hilar or axillary lymph nodes. Small hiatal hernia. The thyroid gland and trachea appear unremarkable. Lungs/Pleura: No pleural effusion or pneumothorax. 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 There is a new solid left upper lobe nodule measuring 5 x 3 mm on image 25/4. No confluent airspace disease or other suspicious pulmonary nodularity. Musculoskeletal/Chest wall: No chest wall mass or suspicious osseous findings. CT ABDOMEN AND PELVIS FINDINGS Hepatobiliary: The liver has a non cirrhotic morphology without suspicious focal abnormality. There is a cystic-appearing subcapsular lesion superiorly adjacent to the left hepatic lobe, measuring 2.1 x 1.5 cm on image 39/3. Although not well seen on most recent noncontrast study, this is grossly unchanged. No evidence of gallstones, gallbladder wall thickening or biliary dilatation. Pancreas: Unremarkable. No pancreatic ductal dilatation or surrounding inflammatory changes. Spleen: Normal in size without focal abnormality. Adrenals/Urinary Tract: Both adrenal glands appear normal. No evidence of urinary tract calculus, suspicious renal lesion or hydronephrosis. Stable simple appearing left renal cyst for which no specific follow-up imaging is recommended. Interval removal of previously demonstrated right nephroureteral catheter. The bladder appears unremarkable for its  degree of distention. Stomach/Bowel: No enteric contrast administered. The stomach appears unremarkable for its degree of distension. No evidence of bowel wall thickening, distention or surrounding inflammatory change. Suspected previous appendectomy, as before. Vascular/Lymphatic: There are no enlarged abdominal or pelvic lymph nodes. Aortic and branch vessel atherosclerosis without evidence of aneurysm or large vessel occlusion. Reproductive: The uterus and ovaries appear stable, without significant findings. Other: No evidence of abdominal wall mass or hernia. No ascites or pneumoperitoneum. Musculoskeletal: No acute or significant osseous findings. Multilevel lumbar facet arthropathy. IMPRESSION: 1. No acute posttraumatic findings identified in the chest, abdomen or pelvis. 2. New 5 x 3 mm left upper lobe pulmonary nodule. Per Fleischner Society Guidelines, if patient is low risk for malignancy, no routine follow-up imaging is recommended. If patient is high risk for malignancy, a non-contrast Chest CT at 12 months is optional. If performed and the nodule is stable at 12 months, no further follow-up is recommended. These guidelines do not apply to immunocompromised patients and patients with cancer. Follow up in patients with significant comorbidities as clinically warranted. For lung cancer screening, adhere to Lung-RADS guidelines. Reference: Radiology. 2017; 284(1):228-43. 3. 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 4. Interval removal of previously demonstrated right nephroureteral catheter. No evidence of urinary tract calculus or hydronephrosis. 5.  Aortic Atherosclerosis (ICD10-I70.0). Electronically Signed   By: Elsie Perone M.D.   On: 04/20/2024 11:59   CT T-SPINE NO CHARGE Result Date: 04/20/2024 CLINICAL DATA:  Status post fall on blood thinners.  Back pain. EXAM: CT Thoracic Spine without contrast TECHNIQUE: Multiplanar CT images of the thoracic  spine were reconstructed from contemporary CT of the Chest. RADIATION DOSE REDUCTION: This exam was performed according to the departmental dose-optimization program which includes automated exposure control, adjustment of the mA and/or kV according to patient size and/or use of iterative reconstruction technique. CONTRAST:  No additional COMPARISON:  Chest CT 09/17/2020 FINDINGS: Alignment: Normal. Vertebrae: No evidence of acute thoracic spine fracture or traumatic subluxation. No aggressive osseous lesions. Paraspinal and other soft  tissues: No significant paraspinal findings. Chest findings dictated separately. Disc levels: Minimal multilevel spondylosis with endplate osteophytes. No evidence of large disc herniation, significant spinal stenosis or significant foraminal narrowing. IMPRESSION: 1. No evidence of acute thoracic spine fracture, traumatic subluxation or static signs of instability. 2. Minimal spondylosis. 3. Chest findings dictated separately. Electronically Signed   By: Elsie Perone M.D.   On: 04/20/2024 11:40   CT Cervical Spine Wo Contrast Result Date: 04/20/2024 CLINICAL DATA:  Fall at home.  On blood thinners. EXAM: CT CERVICAL SPINE WITHOUT CONTRAST TECHNIQUE: Multidetector CT imaging of the cervical spine was performed without intravenous contrast. Multiplanar CT image reconstructions were also generated. RADIATION DOSE REDUCTION: This exam was performed according to the departmental dose-optimization program which includes automated exposure control, adjustment of the mA and/or kV according to patient size and/or use of iterative reconstruction technique. COMPARISON:  None Available. FINDINGS: Alignment: Mild convex right scoliosis. No focal angulation or listhesis. Skull base and vertebrae: No evidence of acute cervical spine fracture or traumatic subluxation. Soft tissues and spinal canal: No prevertebral fluid or swelling. No visible canal hematoma. Disc levels: Multilevel spondylosis  with disc space narrowing, endplate osteophytes and facet hypertrophy. No large disc herniation identified. Mild-to-moderate osseous foraminal narrowing at multiple levels, greatest on the left at C3-4. The left C2-3 facet joint appears ankylosed. Upper chest: Clear lung apices. Other: None. IMPRESSION: 1. No evidence of acute cervical spine fracture, traumatic subluxation or static signs of instability. 2. Multilevel cervical spondylosis as described. Electronically Signed   By: Elsie Perone M.D.   On: 04/20/2024 11:35   DG Pelvis Portable Result Date: 04/20/2024 EXAM: 1 OR 2 VIEW(S) XRAY OF THE PELVIS 04/20/2024 10:30:00 AM COMPARISON: KUB dated 08/27/2023. CLINICAL HISTORY: fall Fall. FINDINGS: BONES AND JOINTS: No acute fracture. No malalignment. SOFT TISSUES: Unremarkable. IMPRESSION: 1. No evidence of acute traumatic injury. Electronically signed by: Evalene Coho MD 04/20/2024 10:34 AM EST RP Workstation: [id number]   DG Chest Portable 1 View Result Date: 04/20/2024 EXAM: 1 VIEW(S) XRAY OF THE CHEST 04/20/2024 10:30:00 AM COMPARISON: 08/17/2023 CLINICAL HISTORY: Fall. FINDINGS: LUNGS AND PLEURA: Diffuse prominence of interstitial markings. No pleural effusion. No pneumothorax. HEART AND MEDIASTINUM: No acute abnormality of the cardiac and mediastinal silhouettes. BONES AND SOFT TISSUES: No acute osseous abnormality. IMPRESSION: 1. No acute cardiopulmonary process. 2. Diffuse prominence of interstitial markings. Electronically signed by: Evalene Coho MD 04/20/2024 10:34 AM EST RP Workstation: [id number]   Labs cbc Recent Labs    04/20/24 1015 04/20/24 1059 04/21/24 0535  WBC 10.5  --  7.8  HGB 14.1 13.3 14.4  HCT 41.9 39.0 42.5  PLT 198  --  195    Labs inflam No results for input(s): CRP in the last 72 hours.  Invalid input(s): ESR  Labs coag No results for input(s): INR, PTT in the last 72 hours.  Invalid input(s): PT  Recent Labs    04/20/24 1015  04/20/24 1059 04/21/24 0535  NA 140 141 138  K 4.5 4.2 4.0  CL 103 104 102  CO2 25  --  24  GLUCOSE 89 95 83  BUN 18 21 22   CREATININE 1.48* 1.50* 1.22*  CALCIUM  10.6*  --  10.0     ASSESSMENT AND PLAN: 84 y.o. female with the following: minimally displaced left distal clavicle fracture. Plan for non operative treatment  This patient requires inpatient admission to manage this problem appropriately. Orthopedics recommends admission to a medical service and we will provide consultation and follow  along  - Weight Bearing Status/Activity: Non weight bearing left arm in sling. May remove sling for hygiene and while sitting. Plan for sling for 4-6 weeks. ROM of elbow, hand and digits encouraged  -VTE Prophylaxis: No ortho contraindications. Per primary team  - Pain control: Judicious use of narcotics  - Follow-up plan: 1-2 weeks in the office     [1]  Allergies Allergen Reactions   Cefaclor Itching    Other reaction(s): Flushing (ALLERGY/intolerance)   Codeine Itching and Nausea And Vomiting   Fructose Swelling and Nausea And Vomiting    Swelling and GI upset, Fructose Granules  Fructose Granules   Sulfa Antibiotics Swelling   Demerol Hcl [Meperidine] Itching and Nausea And Vomiting   Elemental Sulfur Swelling   Propoxyphene Nausea And Vomiting   "

## 2024-04-22 ENCOUNTER — Encounter (HOSPITAL_COMMUNITY): Payer: Self-pay | Admitting: Family Medicine

## 2024-04-22 ENCOUNTER — Observation Stay (HOSPITAL_COMMUNITY): Admit: 2024-04-22 | Discharge: 2024-04-22 | Disposition: A | Attending: Cardiovascular Disease

## 2024-04-22 ENCOUNTER — Observation Stay (HOSPITAL_COMMUNITY)

## 2024-04-22 DIAGNOSIS — S42032A Displaced fracture of lateral end of left clavicle, initial encounter for closed fracture: Secondary | ICD-10-CM

## 2024-04-22 DIAGNOSIS — R55 Syncope and collapse: Secondary | ICD-10-CM

## 2024-04-22 DIAGNOSIS — N1831 Chronic kidney disease, stage 3a: Secondary | ICD-10-CM | POA: Diagnosis present

## 2024-04-22 LAB — CBC
HCT: 39.1 % (ref 36.0–46.0)
Hemoglobin: 13 g/dL (ref 12.0–15.0)
MCH: 32.3 pg (ref 26.0–34.0)
MCHC: 33.2 g/dL (ref 30.0–36.0)
MCV: 97 fL (ref 80.0–100.0)
Platelets: 187 10*3/uL (ref 150–400)
RBC: 4.03 MIL/uL (ref 3.87–5.11)
RDW: 13.1 % (ref 11.5–15.5)
WBC: 7.7 10*3/uL (ref 4.0–10.5)
nRBC: 0 % (ref 0.0–0.2)

## 2024-04-22 LAB — LIPID PANEL
Cholesterol: 125 mg/dL (ref 0–200)
HDL: 53 mg/dL
LDL Cholesterol: 50 mg/dL (ref 0–99)
Total CHOL/HDL Ratio: 2.4 ratio
Triglycerides: 110 mg/dL
VLDL: 22 mg/dL (ref 0–40)

## 2024-04-22 NOTE — Progress Notes (Addendum)
 Physical Therapy Treatment Patient Details Name: Laurie Bowman MRN: [id number] DOB: [birth year] Today's Date: 04/22/2024   History of Present Illness Pt is a 84 y.o. female who presented to [facility] ED 04/20/24 after syncope with fall. She reports waking up in the middle of the night sweaty with the urge to defecate, began to see spots and then blacked out, hitting her head. Pt was on the ground for several hours until her husband found her in the morning.  Left shoulder x-ray showed moderately displaced clavicular fracture. CT showed no acute intracranial findings, left scalp hematoma. Symptomatic orthostatic hypotension during PT session on evaluation. PMHx: osteoporosis, asthma, GERD, HTN, CKD 2, alopecia, HLD, IBS, depression, breast cancer, bladder cancer, hypertrophic cardiomyopathy, and A-fib.    PT Comments  Pt received in chair after working with OT, pt reports nausea subsided since that session but still c/o weakness and now reporting R wrist pain/swelling since fall on admission, RN/MD notified. Pt still with symptomatic drop in BP standing from chair, so BLE TED hose applied (knee high) and after compression socks donned, pt with decreased c/o weakness in BLE/shakiness and able to perform x2 short gait trials with HHA/gait belt support and consistent minA for lift/steadying assist with transfers/gait needed. Given pt functional decline from baseline with new WB restrictions (Evaluating therapist aware as ortho MD updated WB status later in day after initial eval) and had updated her goals in AM prior to session, disposition updated to recommend continued inpatient follow up therapy, <3 hours/day, per OT note pt spouse in agreement with this. Orthostatic Lying   BP- Recliner with legs extended (!) 125/111 (118) (recliner chair w/legs elevated)  Pulse- Reclined 50  Orthostatic Standing at 0 minutes  BP- Standing at 0 minutes 91/51 (64)  Pulse- Standing at 0 minutes 57  Orthostatic Standing at 3  minutes  BP- Standing at 3 minutes  (defer, pt too fatigued/shaky)   Orthostatic Sitting (after TED hose donned)  BP- Sitting 109/79 (89)  Pulse- Sitting 53  Orthostatic Standing at 0 minutes  BP- Standing at 0 minutes 103/76    Orthostatic Lying  (with TED hose in place)  BP- in recliner with legs extended 161/77 (102)  Pulse- reclined 52        If plan is discharge home, recommend the following: A little help with walking and/or transfers;A little help with bathing/dressing/bathroom;Assistance with cooking/housework;Assist for transportation;Help with stairs or ramp for entrance   Can travel by private vehicle      (would have trouble with SUV; could pivot to sedan)  Equipment Recommendations  Wheelchair (measurements PT);Wheelchair cushion (measurements PT);BSC/3in1 (consider QC pending progress)    Recommendations for Other Services       Precautions / Restrictions Precautions Precautions: Fall Recall of Precautions/Restrictions: Impaired Precaution/Restrictions Comments: Watch BP Required Braces or Orthoses: Sling Restrictions Weight Bearing Restrictions Per Provider Order: Yes LUE Weight Bearing Per Provider Order: Non weight bearing Other Position/Activity Restrictions: per ortho note 2/4 placed after PT eval, evaluating therapist now aware and to addend her goals     Mobility  Bed Mobility Overal bed mobility: Needs Assistance             General bed mobility comments: pt received in chair    Transfers Overall transfer level: Needs assistance Equipment used: 1 person hand held assist Transfers: Sit to/from Stand, Bed to chair/wheelchair/BSC Sit to Stand: Min assist           General transfer comment: hand held assist to rise and  steady along wtih gait belt assist due to posterior imbalance upon standing initially for BP before TED hose donned. After TED hose placed, pt wtih less c/o weakness and improved stability upon standing. Mod cues for UE  placement/body alignment with BSC needed prior to pt sitting down.    Ambulation/Gait Ambulation/Gait assistance: Min assist Gait Distance (Feet): 8 Feet (x2 with seated break on BSC) Assistive device: 1 person hand held assist, None Gait Pattern/deviations: Step-to pattern, Step-through pattern, Decreased stride length, Trunk flexed, Wide base of support, Leaning posteriorly       General Gait Details: After TED hose placed, improved symptoms (no dizziness/nausea or visual changes reported) and pt agreeable to try gait training, but needing BSC to urinate. After seated break on BSC, pt c/o increased pain/fatigue and agreeable to return to chair as lunch also arriving at that time.   Stairs Stairs:  (pt not yet able due to pain/fatigue)           Wheelchair Mobility     Tilt Bed    Modified Rankin (Stroke Patients Only)       Balance Overall balance assessment: Needs assistance, History of Falls Sitting-balance support: Single extremity supported Sitting balance-Leahy Scale: Fair Sitting balance - Comments: forward in chair   Standing balance support: Single extremity supported, During functional activity, No upper extremity supported Standing balance-Leahy Scale: Poor Standing balance comment: needs HHA or gait belt support to prevent LOB                            Communication Communication Communication: No apparent difficulties  Cognition Arousal: Alert Behavior During Therapy: Anxious   PT - Cognitive impairments: Safety/Judgement, Problem solving, Attention                       PT - Cognition Comments: Internally distracted due to pain/anxiety. Following simple commands well. Following commands: Intact      Cueing Cueing Techniques: Verbal cues, Gestural cues, Tactile cues  Exercises Other Exercises Other Exercises: seated BLE AROM: ankle pumps, LAQ x 3-5 reps ea for teachback Other Exercises: pursed-lip breathing x10 reps with  cues Other Exercises: reviewed UE wrist/elbow/finger flex/ext and ROMAT in sling, pt reports she's been wiggling her fingers and demos back a few reps Other Exercises: standing BLE AROM: hip flexion x10 reps with HHA and gait belt support prior to gait trial    General Comments General comments (skin integrity, edema, etc.): MD notified of BP and in flowsheet (see above); TED hose measured/placed during session and MD placed order for BLE compression socks, which improved her symptoms of weakness/fatigue to allow for short gait trial at bedside.      Pertinent Vitals/Pain Pain Assessment Pain Assessment: 0-10 Faces Pain Scale: Hurts whole lot Pain Location: L shoulder and R brachial when BP cuff squeezes her arm. Also c/o R wrist pain. Pain Descriptors / Indicators: Grimacing, Discomfort, Aching, Sore, Tender, Moaning Pain Intervention(s): Limited activity within patient's tolerance, Monitored during session, Premedicated before session, Repositioned, Other (comment) (PTA offered ice pack but pt defers)    Home Living Family/patient expects to be discharged to:: Private residence Living Arrangements: Spouse/significant other Available Help at Discharge: Family;Available 24 hours/day;Neighbor (husband cannot physically assist) Type of Home: House Home Access: Stairs to enter Entrance Stairs-Rails: Right;Left Entrance Stairs-Number of Steps: 3 Alternate Level Stairs-Number of Steps: 1 flight Home Layout: Two level;Bed/bath upstairs;Full bath on main level;Able to live on main  level with bedroom/bathroom Home Equipment: Shower seat;Grab bars - toilet;Rolling Walker (2 wheels);Cane - single point;Grab bars - tub/shower      Prior Function            PT Goals (current goals can now be found in the care plan section) Acute Rehab PT Goals Patient Stated Goal: Return Home PT Goal Formulation: With patient Time For Goal Achievement: 05/05/24 Progress towards PT goals: Progressing toward  goals    Frequency    Min 2X/week      PT Plan      Co-evaluation              AM-PAC PT 6 Clicks Mobility   Outcome Measure  Help needed turning from your back to your side while in a flat bed without using bedrails?: A Little Help needed moving from lying on your back to sitting on the side of a flat bed without using bedrails?: A Little Help needed moving to and from a bed to a chair (including a wheelchair)?: A Little Help needed standing up from a chair using your arms (e.g., wheelchair or bedside chair)?: A Little Help needed to walk in hospital room?: Total (<52ft) Help needed climbing 3-5 steps with a railing? : Total 6 Click Score: 14    End of Session Equipment Utilized During Treatment: Gait belt;Other (comment) (LUE sling) Activity Tolerance: Patient tolerated treatment well;Patient limited by pain;Patient limited by fatigue Patient left: in chair;with call bell/phone within reach;with chair alarm set;Other (comment) (set up to eat her lunch; gait belt and TED hose donned) Nurse Communication: Mobility status;Precautions;Other (comment);Weight bearing status (make sure pt has gait belt donned when getting up to [facility] or standing at all, TED hose improved BP symptoms, R UE/wrist pain reported since fall) PT Visit Diagnosis: History of falling (Z91.81);Difficulty in walking, not elsewhere classified (R26.2);Muscle weakness (generalized) (M62.81);Unsteadiness on feet (R26.81)     Time: 1117-1206 PT Time Calculation (min) (ACUTE ONLY): 49 min  Charges:    $Gait Training: 8-22 mins $Therapeutic Exercise: 8-22 mins $Therapeutic Activity: 8-22 mins PT General Charges $$ ACUTE PT VISIT: 1 Visit                     Daylah Sayavong P., PTA Acute Rehabilitation Services Secure Chat Preferred 9a-5:30pm Office: [phone]    Connell HERO [facility] 04/22/2024, 1:13 PM

## 2024-04-22 NOTE — Assessment & Plan Note (Addendum)
 Moderately displaced and probably comminuted distal L clavicle fx. -Orthopedic surgery consulted, recommending 1 week outpatient ortho f/u -Sling for comfort, no other inpatient interventions -OP f/u w/ Dr Sherida 2/9 - Pain: Tylenol  650 mg every 6 hours, oxycodone  5 mg every 6 hours as needed  -Requiring PRN oxycodone  on schedule w/ continued shoulder pain w/ movement and to touch

## 2024-04-22 NOTE — Evaluation (Signed)
 Occupational Therapy Evaluation Patient Details Name: Laurie Bowman MRN: [id number] DOB: 1940-09-03 Today's Date: 04/22/2024   History of Present Illness   Pt is a 84 y.o. female who presented to [facility] ED 04/20/24 after syncope with fall. She reports waking up in the middle of the night sweaty with the urge to defecate, began to see spots and then blacked out, hitting her head. Pt was on the ground for several hours until her husband found her in the morning.  Left shoulder x-ray showed moderately displaced clavicular fracture. CT showed no acute intracranial findings, left scalp hematoma. PMHx: osteoporosis, asthma, GERD, HTN, CKD 2, alopecia, HLD, IBS, depression, breast cancer, bladder cancer, hypertrophic cardiomyopathy, and A-fib.     Clinical Impressions Pt is typically independent and lives with her husband who cannot provide physical assist. Pt presents with L shoulder pain, generalized weakness and impaired standing balance. Pt came to EOB with assist, HOB up and use of rail. Pt stood with hand held assist and transferred to chair. Pt complaining of nausea, did progress mobility. BP112/66 (79) with HR of 52 once seated in chair. Pt needs min to total assist for ADLs. Patient will benefit from continued inpatient follow up therapy, <3 hours/day.     If plan is discharge home, recommend the following:   A little help with walking and/or transfers;A lot of help with bathing/dressing/bathroom;Assistance with cooking/housework;Assist for transportation;Help with stairs or ramp for entrance     Functional Status Assessment   Patient has had a recent decline in their functional status and demonstrates the ability to make significant improvements in function in a reasonable and predictable amount of time.     Equipment Recommendations   Other (comment) (defer)     Recommendations for Other Services         Precautions/Restrictions   Precautions Precautions:  Fall Precaution/Restrictions Comments: Watch BP Required Braces or Orthoses: Sling Restrictions Other Position/Activity Restrictions: adhered to NWB on R UE, no specific order     Mobility Bed Mobility Overal bed mobility: Needs Assistance Bed Mobility: Supine to Sit     Supine to sit: Contact guard, HOB elevated, Used rails          Transfers Overall transfer level: Needs assistance Equipment used: 1 person hand held assist Transfers: Sit to/from Stand, Bed to chair/wheelchair/BSC Sit to Stand: Min assist     Step pivot transfers: Min assist     General transfer comment: hand held assist to rise and steady      Balance Overall balance assessment: Needs assistance, History of Falls   Sitting balance-Leahy Scale: Fair     Standing balance support: Single extremity supported, During functional activity Standing balance-Leahy Scale: Poor Standing balance comment: reliant on hand held assist                           ADL either performed or assessed with clinical judgement   ADL Overall ADL's : Needs assistance/impaired Eating/Feeding: Minimal assistance;Sitting Eating/Feeding Details (indicate cue type and reason): assist to set up Grooming: Wash/dry face;Wash/dry hands;Set up;Sitting   Upper Body Bathing: Moderate assistance;Sitting   Lower Body Bathing: Maximal assistance;Sit to/from stand   Upper Body Dressing : Maximal assistance;Sitting   Lower Body Dressing: Total assistance;Bed level   Toilet Transfer: Minimal assistance;Stand-pivot;BSC/3in1   Toileting- Clothing Manipulation and Hygiene: Maximal assistance;Sit to/from stand               Vision Baseline Vision/History: 1 Wears glasses  Ability to See in Adequate Light: 0 Adequate Patient Visual Report: No change from baseline       Perception         Praxis         Pertinent Vitals/Pain Pain Assessment Pain Assessment: Faces Faces Pain Scale: Hurts little more Pain  Location: L shoulder Pain Descriptors / Indicators: Grimacing, Discomfort, Aching, Sore, Tender Pain Intervention(s): Monitored during session, Repositioned     Extremity/Trunk Assessment Upper Extremity Assessment Upper Extremity Assessment: Right hand dominant;LUE deficits/detail LUE Deficits / Details: full AROM elbow to hand, did not assess shoulder LUE: Unable to fully assess due to immobilization LUE Coordination: decreased gross motor   Lower Extremity Assessment Lower Extremity Assessment: Defer to PT evaluation   Cervical / Trunk Assessment Cervical / Trunk Assessment: Normal   Communication Communication Communication: No apparent difficulties   Cognition Arousal: Alert Behavior During Therapy: Anxious Cognition: No apparent impairments                               Following commands: Intact       Cueing  General Comments   Cueing Techniques: Verbal cues      Exercises     Shoulder Instructions      Home Living Family/patient expects to be discharged to:: Private residence Living Arrangements: Spouse/significant other Available Help at Discharge: Family;Available 24 hours/day;Neighbor (husband cannot physically assist) Type of Home: House Home Access: Stairs to enter [employer] of Steps: 3 Entrance Stairs-Rails: Right;Left Home Layout: Two level;Bed/bath upstairs;Full bath on main level;Able to live on main level with bedroom/bathroom Alternate Level Stairs-Number of Steps: 1 flight Alternate Level Stairs-Rails: Right Bathroom Shower/Tub: Tub/shower unit   Bathroom Toilet: Handicapped height     Home Equipment: Shower seat;Grab bars - toilet;Rolling Environmental Consultant (2 wheels);Cane - single point;Grab bars - tub/shower          Prior Functioning/Environment Prior Level of Function : History of Falls (last six months);Independent/Modified Independent                    OT Problem List: Decreased strength;Decreased  activity tolerance;Impaired balance (sitting and/or standing);Decreased coordination;Decreased knowledge of use of DME or AE;Pain;Impaired UE functional use   OT Treatment/Interventions: Self-care/ADL training;DME and/or AE instruction;Therapeutic activities;Patient/family education;Balance training      OT Goals(Current goals can be found in the care plan section)   Acute Rehab OT Goals OT Goal Formulation: With patient Time For Goal Achievement: 05/06/24 Potential to Achieve Goals: Good   OT Frequency:  Min 2X/week    Co-evaluation              AM-PAC OT 6 Clicks Daily Activity     Outcome Measure Help from another person eating meals?: A Little Help from another person taking care of personal grooming?: A Little Help from another person toileting, which includes using toliet, bedpan, or urinal?: A Lot Help from another person bathing (including washing, rinsing, drying)?: A Lot Help from another person to put on and taking off regular upper body clothing?: A Lot Help from another person to put on and taking off regular lower body clothing?: Total 6 Click Score: 13   End of Session Equipment Utilized During Treatment: Gait belt;Other (comment) (sling) Nurse Communication: Mobility status;Other (comment) (aware pt wants SNF)  Activity Tolerance: Other (comment) (pt with nausea after transfer to chair) Patient left: in chair;with call bell/phone within reach;with chair alarm set  OT Visit  Diagnosis: Unsteadiness on feet (R26.81);Other abnormalities of gait and mobility (R26.89);Pain;Muscle weakness (generalized) (M62.81);History of falling (Z91.81);Dizziness and giddiness (R42) Pain - Right/Left: Left Pain - part of body: Shoulder                Time: 8992-8955 OT Time Calculation (min): 37 min Charges:  OT General Charges $OT Visit: 1 Visit OT Evaluation $OT Eval Moderate Complexity: 1 Mod OT Treatments $Self Care/Home Management : 23-37 mins Mliss HERO,  OTR/L Acute Rehabilitation Services Office: [phone]   Kennth Mliss Helling 04/22/2024, 10:51 AM

## 2024-04-22 NOTE — Progress Notes (Signed)
 "    Daily Progress Note Intern Pager: [phone]  Patient name: Laurie Bowman Medical record number: [id number] Date of birth: 1940/05/23 Age: 84 y.o. Gender: female  Primary Care Provider: Lelon Norleen ORN., MD Consultants: Orthopedic surgery, neurosurgery, cardiology Code Status: DNR  Pt Overview and Major Events to Date:  2/3 - admitted  Medical Decision Making:  84 year old female with past medical history of atrial fibrillation on Eliquis , bladder cancer status posttreatment and hypertrophic obstructive cardiomyopathy admitted after syncopal episode with associated left clavicular fracture.  Left arm in sling for comfort with orthopedic follow-up 2/9.  Syncope most likely vasovagal in nature with possible contribution from HOCM.  Cardiology recommending follow-up with the rhythm clinic and Zio patch x 14 days placed today.  Discharge to [facility] tomorrow. Assessment & Plan Fall Displaced fracture of lateral end of left clavicle, initial encounter for closed fracture Moderately displaced and probably comminuted distal L clavicle fx. -Orthopedic surgery consulted, recommending 1 week outpatient ortho f/u -Sling for comfort, no other inpatient interventions -OP f/u w/ Dr Sherida 2/9 - Pain: Tylenol  650 mg every 6 hours, oxycodone  5 mg every 6 hours as needed  -Requiring PRN oxycodone  on schedule w/ continued shoulder pain w/ movement and to touch Syncope Echo w/ new global LV hypokinesis, small decrease in EF, evidence of G1DD.  -Cardiology consulted  -Arranging for HOCM clinic f/u outpatient  -Ziopatch x 14d placed today  -D/c hydrochlorothiazide  iso concern for volume down given her HOCM - Fall precautions Chronic atrial fibrillation (HCC) Regular rhythm on exam today. Stable bradycardia.  - Continue DOAC per neurosurgery - Continue Verapamil  Chronic health problem HTN: BP stable, discontinue hydrochlorothiazide  iso volume concern w/ her HOCM -continue home verapamil  240 mg  nightly Allergies - continue home loratadine  10 mg HLD - continue home Crestor  10 mg Anxiety/depression - continue home bupropion  150 mg Diarrhea - continue home Imodium  2 mg as needed Constipation - continue home MiraLAX  17 g packet daily Dry/itchy eyes - artificial tears as needed, olopatadine  0.1% as needed   FEN/GI: regular PPx: home eliquis  Dispo:[facility] tomorrow   Subjective:  NAEON. Pain in her shoulder this AM when a provider grabbed it to stabilize her. Feels she is on the right amount of pain medication. Discussed plan for discharge moving forward, husband has arranged for room at [facility] tomorrow.   Objective: Temp:  [97.6 F (36.4 C)-98.5 F (36.9 C)] 97.7 F (36.5 C) (02/05 0738) Pulse Rate:  [54-63] 54 (02/05 0738) Resp:  [16-18] 16 (02/05 0738) BP: (113-148)/(68-80) 113/68 (02/05 0738) SpO2:  [95 %-98 %] 97 % (02/05 0738) Physical Exam: General: Awake, alert, NAD. Communicates clearly. Cardio: Bradycardic, regular rhythm. 3+ systolic murmur. 2+ radial and dorsalis pedis pulses b/l w/ good capillary refill.  Resp: CTA bilaterally.  Abdomen: soft, non-tender, non-distended.  Extremities: R wrist w/ full ROM and 5/5 strength w/ wrist flexion/extension/abduction/adduction w/o pain on exam. Full ROM R elbow. Resolving bruise over dorsal R hand and single bruise on dorsal proximal R forearm.    Laboratory: Most recent CBC Lab Results  Component Value Date   WBC 7.7 04/22/2024   HGB 13.0 04/22/2024   HCT 39.1 04/22/2024   MCV 97.0 04/22/2024   PLT 187 04/22/2024   Most recent BMP    Latest Ref Rng & Units 04/21/2024    5:35 AM  BMP  Glucose 70 - 99 mg/dL 83   BUN 8 - 23 mg/dL 22   Creatinine 9.55 - 1.00 mg/dL 8.77   Sodium 864 -  145 mmol/L 138   Potassium 3.5 - 5.1 mmol/L 4.0   Chloride 98 - 111 mmol/L 102   CO2 22 - 32 mmol/L 24   Calcium  8.9 - 10.3 mg/dL 89.9    Lipid Panel WNL  Imaging/Diagnostic Tests:  R wrist Xray: No acute fracture  appreciated on this resident's interpretation, pending radiology read.  Manon Jester, DO 04/22/2024, 8:08 AM  PGY-1, [facility] FPTS Intern pager: [phone], text pages welcome Secure chat group [facility] Teaching Service   "

## 2024-04-22 NOTE — NC FL2 (Signed)
 " [place]  MEDICAID FL2 LEVEL OF CARE FORM     IDENTIFICATION  Patient Name: Laurie Bowman Birthdate: 1940-05-01 Sex: female Admission Date (Current Location): 04/20/2024  [facility] and [place] Number:  Producer, Television/film/video and Address:  [facility] [place], [address], [place], [place] [address]      Provider Number: [id number]  Attending Physician Name and Address:  McDiarmid, Krystal BIRCH, MD  Relative Name and Phone Number:  Affie, Gasner [phone]    Current Level of Care: Hospital Recommended Level of Care: Skilled Nursing Facility Prior Approval Number:    Date Approved/Denied:   PASRR Number: [id number]  Discharge Plan: SNF    Current Diagnoses: Patient Active Problem List   Diagnosis Date Noted   CKD stage 3a, GFR 45-59 ml/min (HCC) 04/22/2024   Syncope 04/20/2024   Chronic atrial fibrillation (HCC) 04/20/2024   Displaced fracture of lateral end of left clavicle, initial encounter for closed fracture 04/20/2024   Fall 04/20/2024   Chronic health problem 04/20/2024   Generalized weakness 08/18/2023   ARF (acute renal failure) 08/17/2023   Atrial fibrillation with RVR (HCC) 08/17/2023   Elevated troponin 08/17/2023   Acute UTI 08/17/2023   Hyponatremia 08/17/2023   LV dysfunction 10/03/2022   PAF (paroxysmal atrial fibrillation) (HCC) 10/03/2022   Paroxysmal atrial fibrillation with RVR (HCC) 08/31/2021   Rhabdomyolysis 08/30/2021   Fracture, Colles, left, closed 05/03/2021   Aortic atherosclerosis 09/27/2020   Elevated WBC count 09/27/2020   Hypokalemia 09/27/2020   Infection due to Staphylococcus epidermidis 09/27/2020   Other chest pain 09/27/2020   Urticaria    Cancer (HCC)    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   Mitral regurgitation 04/30/2018   LBBB (left bundle branch block) 04/30/2018   PVC's (premature ventricular contractions) 04/30/2018   Asthma with acute exacerbation 06/10/2016   Acute sinusitis 06/10/2016    GERD (gastroesophageal reflux disease) 02/06/2016   Other specified disorders of eyelid 11/09/2015   Allergic rhinitis 09/28/2015   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 06/21/2015   Encounter for long-term (current) use of high-risk medication 06/20/2015   Wound dehiscence 05/08/2015   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   Peripheral visual field defect of both eyes 04/21/2015   Dermatochalasis of both upper eyelids 04/14/2015   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   Asthma 12/16/2013   Benign neoplasm of colon 12/16/2013   Constipation 12/16/2013   Dizziness 12/16/2013   Fatty (change of) liver, not elsewhere classified 12/16/2013   Undiagnosed cardiac murmurs 12/16/2013   Heart murmur, systolic 12/16/2013   Hyperlipidemia, unspecified 12/16/2013   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   Anxiety and depression 12/16/2013   Pain in unspecified knee 12/16/2013   Carpal tunnel syndrome 12/16/2013   Rotator cuff tendonitis 12/16/2013   Ventricular premature depolarization 12/16/2013   Abnormal mammogram 08/16/2013   Hypertension 07/08/2011   Fibrocystic breast changes 07/08/2011    Orientation RESPIRATION BLADDER Height & Weight     Self, Time, Situation, Place  Normal   Weight: 134 lb (60.8 kg) Height:  5' (152.4 cm)  BEHAVIORAL SYMPTOMS/MOOD NEUROLOGICAL BOWEL NUTRITION STATUS        Diet (see discharge summary)  AMBULATORY STATUS COMMUNICATION OF NEEDS Skin   Limited Assist Verbally Other (Comment) (head laceration)                       Personal Care Assistance Level of Assistance  Bathing, Feeding, Dressing, Total care Bathing  Assistance: Maximum assistance Feeding assistance: Limited assistance Dressing Assistance: Maximum assistance Total Care Assistance: Maximum assistance   Functional Limitations Info  Sight, Hearing, Speech Sight Info: Adequate Hearing Info: Adequate Speech Info: Adequate    SPECIAL CARE FACTORS FREQUENCY  PT (By  licensed PT), OT (By licensed OT)     PT Frequency: 5x week OT Frequency: 5x week            Contractures Contractures Info: Not present    Additional Factors Info  Code Status, Allergies Code Status Info: DNR Allergies Info: Cefaclor, Codeine, Fructose, Sulfa Antibiotics, Demerol Hcl (Meperidine), Elemental Sulfur, Propoxyphene           Current Medications (04/22/2024):  This is the current hospital active medication list Current Facility-Administered Medications  Medication Dose Route Frequency Provider Last Rate Last Admin   acetaminophen  (TYLENOL ) tablet 650 mg  650 mg Oral Q6H Baker, Amelia G, DO   650 mg at 04/22/24 1126   apixaban  (ELIQUIS ) tablet 2.5 mg  2.5 mg Oral BID Larraine Palma, MD   2.5 mg at 04/22/24 9193   artificial tears ophthalmic solution 1-2 drop  1-2 drop Both Eyes PRN Baker, Amelia G, DO       buPROPion  (WELLBUTRIN  XL) 24 hr tablet 150 mg  150 mg Oral Daily Baker, Amelia G, DO   150 mg at 04/22/24 9193   diclofenac  Sodium (VOLTAREN ) 1 % topical gel 2 g  2 g Topical QID PRN Baker, Amelia G, DO       loperamide  (IMODIUM ) capsule 2 mg  2 mg Oral PRN Baker, Amelia G, DO       loratadine  (CLARITIN ) tablet 10 mg  10 mg Oral Daily Baker, Amelia G, DO   10 mg at 04/22/24 0805   olopatadine  (PATANOL) 0.1 % ophthalmic solution 1 drop  1 drop Both Eyes BID PRN Baker, Amelia G, DO       oxyCODONE  (Oxy IR/ROXICODONE ) immediate release tablet 5 mg  5 mg Oral Q6H PRN Baker, Amelia G, DO   5 mg at 04/22/24 0805   polyethylene glycol (MIRALAX  / GLYCOLAX ) packet 17 g  17 g Oral Daily Baker, Amelia G, DO   17 g at 04/22/24 9193   rosuvastatin  (CRESTOR ) tablet 10 mg  10 mg Oral Daily Baker, Amelia G, DO   10 mg at 04/22/24 9193   verapamil  (CALAN -SR) CR tablet 240 mg  240 mg Oral QHS Baker, Amelia G, DO   240 mg at 04/21/24 2119     Discharge Medications: Please see discharge summary for a list of discharge medications.  Relevant Imaging Results:  Relevant Lab  Results:   Additional Information SSN : [id number]  Bridget Cordella Simmonds, LCSW     "

## 2024-04-22 NOTE — Progress Notes (Addendum)
 Dr. Raford relayed recs to primary team for DC. She recommends to hold hydrochlorothiazide , arrange 14 day live monitor at discharge, and arrange f/u in HOCM clinic. I placed order for monitor and notified EKG team to come place prior to DC. Nurse/primary team in chat and aware not to DC until this is confirmed placed on patient. Also sent msg to office to help arrange appointment with HOCM clinic. They will call her for follow-up.   [facility] will sign off.   Medication Recommendations:  as above Other recommendations (labs, testing, etc):  14 day live monitor Follow up as an outpatient:  office will call to schedule HOCM clinic; also has 05/2024 f/u with Dr. Almetta which we will keep

## 2024-04-22 NOTE — Assessment & Plan Note (Addendum)
 Echo w/ new global LV hypokinesis, small decrease in EF, evidence of G1DD.  -Cardiology consulted  -Arranging for HOCM clinic f/u outpatient  -Ziopatch x 14d placed today  -D/c hydrochlorothiazide  iso concern for volume down given her HOCM - Fall precautions

## 2024-04-22 NOTE — Assessment & Plan Note (Addendum)
 HTN: BP stable, discontinue hydrochlorothiazide  iso volume concern w/ her HOCM -continue home verapamil  240 mg nightly Allergies - continue home loratadine  10 mg HLD - continue home Crestor  10 mg Anxiety/depression - continue home bupropion  150 mg Diarrhea - continue home Imodium  2 mg as needed Constipation - continue home MiraLAX  17 g packet daily Dry/itchy eyes - artificial tears as needed, olopatadine  0.1% as needed

## 2024-04-22 NOTE — TOC Initial Note (Addendum)
 Transition of Care [facility]) - Initial/Assessment Note    Patient Details  Name: Laurie Bowman MRN: [id number] Date of Birth: [birth year]  [facility] CM/SW Contact:    Bridget Cordella Simmonds, LCSW Phone Number: 04/22/2024, 2:47 PM  Clinical Narrative:    CSW informed that pt and husband have already been in touch with [facility] about STR there.  CSW received message from [facility] that they can make bed offer for this pt.  CSW spoke with pt, who confirms the above.  Pt from home with husband Ray, no current services.  Permission given to speak with Ray and with son Rankin.    SNF auth request submitted in [facility] and approved: [id number], 5 days: 2/6-2/10.                Expected Discharge Plan: Skilled Nursing Facility Barriers to Discharge: Other (must enter comment) (insurance auth)   Patient Goals and CMS Choice Patient states their goals for this hospitalization and ongoing recovery are:: be able to take care of self, home, cat   Choice offered to / list presented to : Patient (requesting [facility])      Expected Discharge Plan and Services In-house Referral: Clinical Social Work   Post Acute Care Choice: Skilled Nursing Facility Living arrangements for the past 2 months: Single Family Home                                      Prior Living Arrangements/Services Living arrangements for the past 2 months: Single Family Home Lives with:: Spouse Patient language and need for interpreter reviewed:: Yes Do you feel safe going back to the place where you live?: Yes      Need for Family Participation in Patient Care: Yes (Comment) Care giver support system in place?: Yes (comment) Current home services: Other (comment) (none) Criminal Activity/Legal Involvement Pertinent to Current Situation/Hospitalization: No - Comment as needed  Activities of Daily Living   ADL Screening (condition at time of admission) Independently performs ADLs?: Yes (appropriate  for developmental age) Is the patient deaf or have difficulty hearing?: No Does the patient have difficulty concentrating, remembering, or making decisions?: No  Permission Sought/Granted Permission sought to share information with : Family Supports Permission granted to share information with : Yes, Verbal Permission Granted  Share Information with NAME: husband Ray, son Rankin  Permission granted to share info w AGENCY: SNF        Emotional Assessment Appearance:: Appears stated age Attitude/Demeanor/Rapport: Engaged Affect (typically observed): Appropriate, Pleasant Orientation: : Oriented to Self, Oriented to Place, Oriented to  Time, Oriented to Situation      Admission diagnosis:  Fall [W19.XXXA] Chronic atrial fibrillation (HCC) [I48.20] Syncope, unspecified syncope type [R55] Closed fracture of proximal end of left humerus, unspecified fracture morphology, initial encounter [S42.202A] Patient Active Problem List   Diagnosis Date Noted   CKD stage 3a, GFR 45-59 ml/min (HCC) 04/22/2024   Syncope 04/20/2024   Chronic atrial fibrillation (HCC) 04/20/2024   Displaced fracture of lateral end of left clavicle, initial encounter for closed fracture 04/20/2024   Fall 04/20/2024   Chronic health problem 04/20/2024   Generalized weakness 08/18/2023   ARF (acute renal failure) 08/17/2023   Atrial fibrillation with RVR (HCC) 08/17/2023   Elevated troponin 08/17/2023   Acute UTI 08/17/2023   Hyponatremia 08/17/2023   LV dysfunction 10/03/2022   PAF (paroxysmal atrial fibrillation) (HCC) 10/03/2022   Paroxysmal  atrial fibrillation with RVR (HCC) 08/31/2021   Rhabdomyolysis 08/30/2021   Fracture, Colles, left, closed 05/03/2021   Aortic atherosclerosis 09/27/2020   Elevated WBC count 09/27/2020   Hypokalemia 09/27/2020   Infection due to Staphylococcus epidermidis 09/27/2020   Other chest pain 09/27/2020   Urticaria    Cancer (HCC)    Hypertrophic cardiomyopathy (HCC)  04/30/2018   Mitral regurgitation 04/30/2018   LBBB (left bundle branch block) 04/30/2018   PVC's (premature ventricular contractions) 04/30/2018   Asthma with acute exacerbation 06/10/2016   Acute sinusitis 06/10/2016   GERD (gastroesophageal reflux disease) 02/06/2016   Other specified disorders of eyelid 11/09/2015   Allergic rhinitis 09/28/2015   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 06/21/2015   Encounter for long-term (current) use of high-risk medication 06/20/2015   Wound dehiscence 05/08/2015   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   Peripheral visual field defect of both eyes 04/21/2015   Dermatochalasis of both upper eyelids 04/14/2015   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   Asthma 12/16/2013   Benign neoplasm of colon 12/16/2013   Constipation 12/16/2013   Dizziness 12/16/2013   Fatty (change of) liver, not elsewhere classified 12/16/2013   Undiagnosed cardiac murmurs 12/16/2013   Heart murmur, systolic 12/16/2013   Hyperlipidemia, unspecified 12/16/2013   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   Anxiety and depression 12/16/2013   Pain in unspecified knee 12/16/2013   Carpal tunnel syndrome 12/16/2013   Rotator cuff tendonitis 12/16/2013   Ventricular premature depolarization 12/16/2013   Abnormal mammogram 08/16/2013   Hypertension 07/08/2011   Fibrocystic breast changes 07/08/2011   PCP:  Lelon Norleen ORN., MD Pharmacy:   [facility] [phone] - [place], [place] - [address] [place] [address] Phone: [phone] Fax: [phone]     Social Drivers of Health (SDOH) Social History: SDOH Screenings   Food Insecurity: No Food Insecurity (04/20/2024)  Housing: Low Risk (04/20/2024)  Transportation Needs: No Transportation Needs (04/20/2024)  Utilities: Not At Risk (04/20/2024)  Social Connections: Unknown (04/20/2024)  Tobacco Use: Medium Risk (02/15/2024)   Received from [facility]   SDOH Interventions:     Readmission  Risk Interventions     No data to display

## 2024-04-22 NOTE — Assessment & Plan Note (Addendum)
 Regular rhythm on exam today. Stable bradycardia.  - Continue DOAC per neurosurgery - Continue Verapamil 

## 2024-04-23 MED ORDER — OXYCODONE HCL 5 MG PO TABS: 5.0000 mg | ORAL_TABLET | Freq: Four times a day (QID) | ORAL | 0 refills | Status: AC | PRN

## 2024-04-23 MED ORDER — ACETAMINOPHEN 325 MG PO TABS: 650.0000 mg | ORAL_TABLET | Freq: Four times a day (QID) | ORAL | Status: AC | PRN

## 2024-04-23 NOTE — Plan of Care (Signed)

## 2024-04-23 NOTE — Discharge Summary (Cosign Needed Addendum)
 "  Family Medicine Teaching [facility] Discharge Summary  Patient name: Laurie Bowman Medical record number: [id number] Date of birth: 10-29-1940 Age: 84 y.o. Gender: female Date of Admission: 04/20/2024  Date of Discharge: 04/23/24 Admitting Physician: Suzann CHRISTELLA Daring, MD  Primary Care Provider: Lelon Norleen ORN., MD Consultants: Ortho, Cardiology, neurosurgery  Indication for Hospitalization: Syncope  Discharge Diagnoses/Problem List:  Principal Problem for Admission: Syncope Other Problems addressed during stay:  Principal Problem:   Displaced fracture of lateral end of left clavicle, initial encounter for closed fracture Active Problems:   Syncope   Chronic atrial fibrillation (HCC)   Fall   CKD stage 3a, GFR 45-59 ml/min [facility])   Brief Hospital Course:  Laurie Bowman is a 84 y.o.female with a history of A fib and bladder cancer s/p treatment, hypertrophic cardiomyoppathy who was admitted to the [facility] Medicine Teaching Service at [facility] for syncope and fall with L clavicular fracture. Her hospital course is detailed below:  Syncope Fall Patient presented to ED after syncopal episode while on the toilet trying to defecate; she fell and hit her head. She denied LOC. CT head without acute intracranial findings; did have a left frontoparietal scalp hematoma. No C spine injury or acute intracranial bleeding, evidence of chronic bleeding but NSGY was contacted and they reassured that this was not grounds for holding her DOAC. Orthostatic vital signs were positive. Echo was done; see below for further details. Patient with history of similar episodes, likely vasovagal.   L Clavicle Fracture Had L shoulder pain after fall and xray showed moderately displaced and probably comminuted distal left clavicular fracture. Orthopedics was consulted who recommended shoulder sling for 4-6 weeks and close outpatient follow-up; did not recommend surgery inpatient. She was discharged to SNF for further  rehab and lack of available support at home.  HOCM Patient with a history of HOCM per chart review; echo 08/2023 with LV outflow tract peak gradient of 138 mmHg and EF 50-55%. Echo done this hospitalization as part of syncope workup showed LVEF 45-50% with global hypokinesis of LV and LV outflow track gradient 60 mmHg; cards was consulted following. They recommended holding hydrochlorothiazide  and placing a 14 day monitor at discharge with patient to follow-up in outpatient HOCM clinic. No further inpatient workup pursued/recommended.  Hx of bladder cancer Elevated Liver Enzymes Initial AST/ALT elevated to 63/54 which downtrended prior to discharge. Patient with history of bladder cancer s/p treatment. CT A/P showed cystic-appearing subcapsular lesion superiorly adjacent to the left hepatic lobe which was seen on previous imaging. There was also a new solid left upper lobe lung nodule.  R wrist pain Reported on 2/5, xray negative for fracture. Full painless ROM on d/c exam.    Other chronic conditions were medically managed with home medications and formulary alternatives as necessary (A fib, CKD, HTN, HLD, anxiety/depression, diarrhea, constipation, dry/itchy eyes)  PCP Follow-up Recommendations: Repeat CMP Ensure follow-up with orthopedics (Dr. Sherida) 2/9  Ensure outpatient follow-up with HOCM clinic Consider restarting hydrochlorothiazide , currently held per cards and with low BP/orthostasis. Consider replacing with ARB. Consider repeat non-contrast Chest CT in 12 months for incidental LUL lung nodule    Results/Tests Pending at Time of Discharge:  Unresulted Labs (From admission, onward)    None      Disposition: SNF  Discharge Condition: Stable, L arm in sling  Discharge Exam:  Vitals:   04/23/24 0518 04/23/24 0754  BP: 110/82 110/65  Pulse:  (!) 52  Resp: 16 17  Temp: (!) 97.4 F (  36.3 C) 97.6 F (36.4 C)  SpO2: 94% 100%   Exam per Dr Manon 04/23/24 General: Awake,  alert, NAD. Communicates clearly. HEENT: Clotted blood over L temporal region laceration, no active bleeding, mild TTP.  Cardio: 3+ systolic heart murmur, RRR. Normal S1, S2. 2+ radial and dorsalis pedis pulses b/l w/ good capillary refill.  Resp: CTA bilaterally. No wheezes, rales, or rhonchi. Normal work of breathing on room air Abdomen: soft, non-tender, non-distended. Extremities: L arm in sling w/ exquisite TTP over distal clavicular region. B/l hands and wrists w/ full painless ROM.  Skin: Single bruise over R dorsal forearm, resolving bruise over dorsal R hand. Bruising over L trapezius region.  Neuro: AOx4. No focal deficits.  Psych: Appropriate mood and affect.   Significant Procedures: None  Significant Labs and Imaging:  Recent Labs  Lab 04/22/24 0411  WBC 7.7  HGB 13.0  HCT 39.1  PLT 187   No results for input(s): NA, K, CL, CO2, GLUCOSE, BUN, CREATININE, CALCIUM , MG, PHOS, ALKPHOS, AST, ALT, ALBUMIN, PROTEIN in the last 48 hours.  Troponin 23, 19 CK 101  Xray L shoulder, humerus, forearm: Moderately displaced and probably comminuted distal left clavicular fracture. Possible minimal separation of left acromioclavicular joint.   CT head, c spine, t spine:   1. No acute intracranial findings. 2. Left frontoparietal scalp hematoma. 3. Focal extra-axial hypoattenuation over the right frontoparietal lobes, up to 8 mm in thickness, which may represent a subdural hygroma versus chronic subdural hematoma.   1. No evidence of acute cervical spine fracture, traumatic subluxation or static signs of instability. 2. Multilevel cervical spondylosis as described.   1. No evidence of acute thoracic spine fracture, traumatic subluxation or static signs of instability. 2. Minimal spondylosis.  Discharge Medications:  Allergies as of 04/23/2024       Reactions   Cefaclor Itching   Other reaction(s): Flushing (ALLERGY/intolerance)   Codeine  Itching, Nausea And Vomiting   Fructose Swelling, Nausea And Vomiting   Swelling and GI upset, Fructose Granules Fructose Granules   Sulfa Antibiotics Swelling   Demerol Hcl [meperidine] Itching, Nausea And Vomiting   Elemental Sulfur Swelling   Propoxyphene Nausea And Vomiting        Medication List     PAUSE taking these medications    hydrochlorothiazide  12.5 MG capsule Wait to take this until your doctor or other care provider tells you to start again. Commonly known as: MICROZIDE  Take 1 capsule (12.5 mg total) by mouth daily.       STOP taking these medications    amoxicillin  500 MG capsule Commonly known as: AMOXIL        TAKE these medications    acetaminophen  325 MG tablet Commonly known as: TYLENOL  Take 2 tablets (650 mg total) by mouth every 6 (six) hours as needed. What changed:  medication strength how much to take when to take this reasons to take this   albuterol  108 (90 Base) MCG/ACT inhaler Commonly known as: VENTOLIN  HFA USE 1-2 PUFFS EVERY 4-6 HOURS AS NEEDED FOR COUGH OR WHEEZE.   albuterol  (2.5 MG/3ML) 0.083% nebulizer solution Commonly known as: PROVENTIL  INHALE 3 ML BY NEBULIZATION EVERY 6 HOURS AS NEEDED FOR WHEEZING OR SHORTNESS OF BREATH   apixaban  2.5 MG Tabs tablet Commonly known as: ELIQUIS  Take 1 tablet (2.5 mg total) by mouth 2 (two) times daily.   Artificial Tears ophthalmic solution Place 1-2 drops into both eyes as needed (dryness).   augmented betamethasone dipropionate 0.05 % cream Commonly  known as: DIPROLENE-AF Apply 0.05 application  topically daily as needed (Lichen).   buPROPion  150 MG 24 hr tablet Commonly known as: WELLBUTRIN  XL Take 150 mg by mouth daily.   clobetasol cream 0.05 % Commonly known as: TEMOVATE Apply 1 application  topically daily as needed (Scratches).   clotrimazole-betamethasone cream Commonly known as: LOTRISONE Apply 1 application  topically daily as needed (Fungus).   diclofenac   Sodium 1 % Gel Commonly known as: VOLTAREN  Apply 2 g topically 4 (four) times daily as needed (Pain). Both knees   dicyclomine  20 MG tablet Commonly known as: BENTYL  Take 20 mg by mouth daily as needed for spasms.   fexofenadine 180 MG tablet Commonly known as: ALLEGRA Take 180 mg by mouth daily.   fluticasone  110 MCG/ACT inhaler Commonly known as: Flovent  HFA Use two puffs twice daily for one to two weeks during flares   lansoprazole  30 MG capsule Commonly known as: PREVACID  Take 1 capsule (30 mg total) by mouth 2 (two) times daily before a meal.   loperamide  2 MG capsule Commonly known as: IMODIUM  Take 1 capsule (2 mg total) by mouth as needed for diarrhea or loose stools (diarrhea).   minoxidil 2.5 MG tablet Commonly known as: LONITEN Take 1.25 mg by mouth daily.   mometasone  50 MCG/ACT nasal spray Commonly known as: NASONEX  Place 2 sprays into the nose daily.   montelukast  10 MG tablet Commonly known as: SINGULAIR  Take 1 tablet (10 mg total) by mouth at bedtime.   olopatadine  0.1 % ophthalmic solution Commonly known as: Patanol Place 1 drop into both eyes 2 (two) times daily as needed.   oxyCODONE  5 MG immediate release tablet Commonly known as: Oxy IR/ROXICODONE  Take 1 tablet (5 mg total) by mouth every 6 (six) hours as needed for up to 5 days for severe pain (pain score 7-10).   PEG 3350  17 GM/SCOOP Powd Take 17 g by mouth daily.   potassium chloride  10 MEQ tablet Commonly known as: KLOR-CON  M Take 10-20 mEq by mouth See admin instructions. Take 10 mEq (1 tablet) by mouth every morning and 20 mEq (2 tablets) every night.   rosuvastatin  10 MG tablet Commonly known as: CRESTOR  Take 10 mg by mouth daily.   tiZANidine 4 MG tablet Commonly known as: ZANAFLEX Take 1 mg by mouth at bedtime as needed (Sleep).   triamcinolone cream 0.1 % Commonly known as: KENALOG Apply 1 application  topically 2 (two) times daily as needed (Eyes).   verapamil  240 MG CR  tablet Commonly known as: CALAN -SR Take 1 tablet (240 mg total) by mouth at bedtime.   Vitamin D 50 MCG (2000 UT) Caps Take 2,000 Units by mouth daily.        Discharge Instructions: Please refer to Patient Instructions section of EMR for full details.  Patient was counseled important signs and symptoms that should prompt return to medical care, changes in medications, dietary instructions, activity restrictions, and follow up appointments.   Follow-Up Appointments:  Follow-up Information     Santo Stanly LABOR, MD Follow up.   Specialty: Cardiology Why: Davene Nicolas - cardiology office will call you to arrange a follow-up with our hypertrophic cardiomyopathy team. Contact information: [address] [place] [address] [phone]         Sherida Adine BROCKS, MD Follow up.   Specialty: Orthopedic Surgery Contact information: [address] [place] [address] [phone]                 Manon,  Leafy, DO 04/23/2024, 9:54 AM PGY-1, [facility]  I have reviewed the above note, agree with its content, and have made the appropriate changes.    Rea Raring, MD [facility], PGY-2   "

## 2024-04-23 NOTE — TOC Transition Note (Signed)
 Transition of Care [facility]) - Discharge Note   Patient Details  Name: Laurie Bowman MRN: [id number] Date of Birth: January 05, 1941  Transition [facility]) CM/SW Contact:  Bridget Cordella Simmonds, LCSW Phone Number: 04/23/2024, 11:50 AM   Clinical Narrative:   Pt discharging to [facility], room 504. RN call report to [phone].   PTAR called [phone].  Final next level of care: Skilled Nursing Facility Barriers to Discharge: Barriers Resolved   Patient Goals and CMS Choice Patient states their goals for this hospitalization and ongoing recovery are:: be able to take care of self, home, cat   Choice offered to / list presented to : Patient (requesting [facility])      Discharge Placement              Patient chooses bed at: [facility] Patient to be transferred to facility by: ptar Name of family member notified: left message husband Levander, spoke with son Rankin Patient and family notified of of transfer: 04/23/24  Discharge Plan and Services Additional resources added to the After Visit Summary for   In-house Referral: Clinical Social Work   Post Acute Care Choice: Skilled Nursing Facility                               Social Drivers of Health (SDOH) Interventions SDOH Screenings   Food Insecurity: No Food Insecurity (04/20/2024)  Housing: Low Risk (04/20/2024)  Transportation Needs: No Transportation Needs (04/20/2024)  Utilities: Not At Risk (04/20/2024)  Social Connections: Unknown (04/20/2024)  Tobacco Use: Medium Risk (04/22/2024)     Readmission Risk Interventions     No data to display

## 2024-04-23 NOTE — Progress Notes (Signed)
 Removed IV-CDI. PTAR came to transport patient to [facility]. Report called.

## 2024-04-23 NOTE — Progress Notes (Signed)
 Occupational Therapy Treatment Patient Details Name: Laurie Bowman MRN: [id number] DOB: 14-Dec-1940 Today's Date: 04/23/2024   History of present illness Pt is a 84 y.o. female who presented to [facility] ED 04/20/24 after syncope with fall. She reports waking up in the middle of the night sweaty with the urge to defecate, began to see spots and then blacked out, hitting her head. Pt was on the ground for several hours until her husband found her in the morning.  Left shoulder x-ray showed moderately displaced clavicular fracture. CT showed no acute intracranial findings, left scalp hematoma. Symptomatic orthostatic hypotension during PT session on evaluation. PMHx: osteoporosis, asthma, GERD, HTN, CKD 2, alopecia, HLD, IBS, depression, breast cancer, bladder cancer, hypertrophic cardiomyopathy, and A-fib.   OT comments  Pt came to EOB without physical assist. Sat momentarily before transferring to [facility] and then to recliner holding bed rail or arm of chair. Moderate assistance to manage mesh panties. Set up for grooming in sitting. Pt denies dizziness for weak feeling. Declined use of compression socks, reports they made her toes sore. Patient will benefit from continued inpatient follow up therapy, <3 hours/day.      If plan is discharge home, recommend the following:  A little help with walking and/or transfers;A lot of help with bathing/dressing/bathroom;Assistance with cooking/housework;Assist for transportation;Help with stairs or ramp for entrance   Equipment Recommendations  Other (comment) (defer)    Recommendations for Other Services      Precautions / Restrictions Precautions Precautions: Fall Precaution/Restrictions Comments: Watch BP Required Braces or Orthoses: Sling Restrictions Weight Bearing Restrictions Per Provider Order: Yes LUE Weight Bearing Per Provider Order: Non weight bearing       Mobility Bed Mobility Overal bed mobility: Modified Independent             General  bed mobility comments: HOB up, increased time and use of rail    Transfers Overall transfer level: Needs assistance Equipment used: None Transfers: Sit to/from Stand, Bed to chair/wheelchair/BSC Sit to Stand: Contact guard assist                 Balance                                           ADL either performed or assessed with clinical judgement   ADL Overall ADL's : Needs assistance/impaired     Grooming: Wash/dry hands;Wash/dry face;Set up;Sitting                   Toilet Transfer: Contact guard assist;Stand-pivot   Toileting- Clothing Manipulation and Hygiene: Moderate assistance;Sit to/from stand              Extremity/Trunk Assessment Upper Extremity Assessment LUE Deficits / Details: completed AROM of elbow to hand            Vision       Perception     Praxis     Communication Communication Communication: No apparent difficulties   Cognition Arousal: Alert Behavior During Therapy: Anxious Cognition: No apparent impairments                               Following commands: Intact        Cueing   Cueing Techniques: Verbal cues  Exercises      Shoulder Instructions  General Comments      Pertinent Vitals/ Pain       Pain Assessment Pain Assessment: Faces Faces Pain Scale: Hurts little more Pain Location: L shoulder Pain Descriptors / Indicators: Aching, Sore, Tender, Grimacing, Guarding Pain Intervention(s): Repositioned, Premedicated before session  Home Living                                          Prior Functioning/Environment              Frequency  Min 2X/week        Progress Toward Goals  OT Goals(current goals can now be found in the care plan section)  Progress towards OT goals: Progressing toward goals  Acute Rehab OT Goals OT Goal Formulation: With patient Time For Goal Achievement: 05/06/24 Potential to Achieve Goals: Good  Plan       Co-evaluation                 AM-PAC OT 6 Clicks Daily Activity     Outcome Measure   Help from another person eating meals?: A Little Help from another person taking care of personal grooming?: A Little Help from another person toileting, which includes using toliet, bedpan, or urinal?: A Lot Help from another person bathing (including washing, rinsing, drying)?: A Lot Help from another person to put on and taking off regular upper body clothing?: A Lot Help from another person to put on and taking off regular lower body clothing?: Total 6 Click Score: 13    End of Session Equipment Utilized During Treatment: Gait belt;Other (comment) (sling)  OT Visit Diagnosis: Unsteadiness on feet (R26.81);Other abnormalities of gait and mobility (R26.89);Pain;Muscle weakness (generalized) (M62.81);History of falling (Z91.81);Dizziness and giddiness (R42)   Activity Tolerance Patient tolerated treatment well   Patient Left in chair;with call bell/phone within reach;with chair alarm set;with nursing/sitter in room   Nurse Communication          Time: 8975-8894 OT Time Calculation (min): 41 min  Charges: OT General Charges $OT Visit: 1 Visit OT Treatments $Self Care/Home Management : 38-52 mins  Mliss HERO, OTR/L Acute Rehabilitation Services Office: [phone]   Kennth Mliss Helling 04/23/2024, 11:06 AM

## 2024-04-23 NOTE — Plan of Care (Signed)
" °  Problem: Education: Goal: Knowledge of General Education information will improve Description: Including pain rating scale, medication(s)/side effects and non-pharmacologic comfort measures 04/23/2024 1106 by Delores Kirsch, RN Outcome: Adequate for Discharge 04/23/2024 1106 by Delores Kirsch, RN Outcome: Progressing   Problem: Health Behavior/Discharge Planning: Goal: Ability to manage health-related needs will improve 04/23/2024 1106 by Delores Kirsch, RN Outcome: Adequate for Discharge 04/23/2024 1106 by Delores Kirsch, RN Outcome: Progressing   Problem: Clinical Measurements: Goal: Ability to maintain clinical measurements within normal limits will improve 04/23/2024 1106 by Delores Kirsch, RN Outcome: Adequate for Discharge 04/23/2024 1106 by Delores Kirsch, RN Outcome: Progressing Goal: Will remain free from infection Outcome: Adequate for Discharge Goal: Diagnostic test results will improve Outcome: Adequate for Discharge Goal: Respiratory complications will improve Outcome: Adequate for Discharge Goal: Cardiovascular complication will be avoided Outcome: Adequate for Discharge   Problem: Clinical Measurements: Goal: Will remain free from infection Outcome: Adequate for Discharge   Problem: Clinical Measurements: Goal: Diagnostic test results will improve Outcome: Adequate for Discharge   Problem: Clinical Measurements: Goal: Respiratory complications will improve Outcome: Adequate for Discharge   Problem: Clinical Measurements: Goal: Cardiovascular complication will be avoided Outcome: Adequate for Discharge   Problem: Activity: Goal: Risk for activity intolerance will decrease Outcome: Adequate for Discharge   Problem: Nutrition: Goal: Adequate nutrition will be maintained Outcome: Adequate for Discharge   Problem: Coping: Goal: Level of anxiety will decrease Outcome: Adequate for Discharge   Problem: Elimination: Goal: Will not experience complications  related to bowel motility Outcome: Adequate for Discharge Goal: Will not experience complications related to urinary retention Outcome: Adequate for Discharge   Problem: Pain Managment: Goal: General experience of comfort will improve and/or be controlled Outcome: Adequate for Discharge   Problem: Safety: Goal: Ability to remain free from injury will improve Outcome: Adequate for Discharge   Problem: Skin Integrity: Goal: Risk for impaired skin integrity will decrease Outcome: Adequate for Discharge   "

## 2024-06-04 ENCOUNTER — Ambulatory Visit: Admitting: Student in an Organized Health Care Education/Training Program

## 2024-07-01 ENCOUNTER — Ambulatory Visit: Admitting: Allergy & Immunology
